# Patient Record
Sex: Female | Born: 1940 | Race: Black or African American | Hispanic: No | Marital: Single | Smoking: Former smoker
Health system: Southern US, Community
[De-identification: ages and names within clinical notes are randomized; demographics above are authoritative.]

## PROBLEM LIST (undated history)

## (undated) DIAGNOSIS — F0280 Dementia in other diseases classified elsewhere without behavioral disturbance: Secondary | ICD-10-CM

## (undated) DIAGNOSIS — G309 Alzheimer's disease, unspecified: Secondary | ICD-10-CM

## (undated) DIAGNOSIS — R079 Chest pain, unspecified: Secondary | ICD-10-CM

## (undated) DIAGNOSIS — F319 Bipolar disorder, unspecified: Secondary | ICD-10-CM

## (undated) DIAGNOSIS — R1310 Dysphagia, unspecified: Secondary | ICD-10-CM

## (undated) DIAGNOSIS — J392 Other diseases of pharynx: Secondary | ICD-10-CM

## (undated) DIAGNOSIS — M79671 Pain in right foot: Secondary | ICD-10-CM

## (undated) DIAGNOSIS — R51 Headache: Secondary | ICD-10-CM

## (undated) DIAGNOSIS — K648 Other hemorrhoids: Secondary | ICD-10-CM

## (undated) DIAGNOSIS — E039 Hypothyroidism, unspecified: Secondary | ICD-10-CM

## (undated) DIAGNOSIS — H8110 Benign paroxysmal vertigo, unspecified ear: Secondary | ICD-10-CM

## (undated) DIAGNOSIS — M7989 Other specified soft tissue disorders: Secondary | ICD-10-CM

## (undated) DIAGNOSIS — M654 Radial styloid tenosynovitis [de Quervain]: Secondary | ICD-10-CM

## (undated) DIAGNOSIS — K219 Gastro-esophageal reflux disease without esophagitis: Secondary | ICD-10-CM

## (undated) DIAGNOSIS — E785 Hyperlipidemia, unspecified: Secondary | ICD-10-CM

## (undated) DIAGNOSIS — D649 Anemia, unspecified: Secondary | ICD-10-CM

## (undated) DIAGNOSIS — M5430 Sciatica, unspecified side: Secondary | ICD-10-CM

## (undated) DIAGNOSIS — M7918 Myalgia, other site: Secondary | ICD-10-CM

## (undated) DIAGNOSIS — R209 Unspecified disturbances of skin sensation: Secondary | ICD-10-CM

## (undated) DIAGNOSIS — M436 Torticollis: Secondary | ICD-10-CM

## (undated) DIAGNOSIS — R300 Dysuria: Secondary | ICD-10-CM

## (undated) DIAGNOSIS — E876 Hypokalemia: Secondary | ICD-10-CM

## (undated) DIAGNOSIS — Z5189 Encounter for other specified aftercare: Secondary | ICD-10-CM

## (undated) DIAGNOSIS — R109 Unspecified abdominal pain: Secondary | ICD-10-CM

## (undated) DIAGNOSIS — T7840XA Allergy, unspecified, initial encounter: Secondary | ICD-10-CM

## (undated) DIAGNOSIS — I10 Essential (primary) hypertension: Secondary | ICD-10-CM

## (undated) DIAGNOSIS — D126 Benign neoplasm of colon, unspecified: Secondary | ICD-10-CM

## (undated) DIAGNOSIS — H269 Unspecified cataract: Secondary | ICD-10-CM

## (undated) DIAGNOSIS — D849 Immunodeficiency, unspecified: Secondary | ICD-10-CM

## (undated) DIAGNOSIS — R569 Unspecified convulsions: Secondary | ICD-10-CM

## (undated) HISTORY — DX: Dementia in other diseases classified elsewhere, unspecified severity, without behavioral disturbance, psychotic disturbance, mood disturbance, and anxiety: F02.80

## (undated) HISTORY — DX: Benign paroxysmal vertigo, unspecified ear: H81.10

## (undated) HISTORY — DX: Hypokalemia: E87.6

## (undated) HISTORY — DX: Benign neoplasm of colon, unspecified: D12.6

## (undated) HISTORY — DX: Alzheimer's disease, unspecified: G30.9

## (undated) HISTORY — DX: Sciatica, unspecified side: M54.30

## (undated) HISTORY — DX: Myalgia, other site: M79.18

## (undated) HISTORY — DX: Hyperlipidemia, unspecified: E78.5

## (undated) HISTORY — DX: Other specified soft tissue disorders: M79.89

## (undated) HISTORY — DX: Unspecified abdominal pain: R10.9

## (undated) HISTORY — DX: Radial styloid tenosynovitis (de quervain): M65.4

## (undated) HISTORY — DX: Anemia, unspecified: D64.9

## (undated) HISTORY — DX: Encounter for other specified aftercare: Z51.89

## (undated) HISTORY — DX: Unspecified disturbances of skin sensation: R20.9

## (undated) HISTORY — DX: Unspecified cataract: H26.9

## (undated) HISTORY — DX: Headache: R51

## (undated) HISTORY — DX: Allergy, unspecified, initial encounter: T78.40XA

## (undated) HISTORY — DX: Pain in right foot: M79.671

## (undated) HISTORY — DX: Torticollis: M43.6

## (undated) HISTORY — PX: ABDOMINAL HYSTERECTOMY: SHX81

## (undated) HISTORY — DX: Dysphagia, unspecified: R13.10

## (undated) HISTORY — DX: Dysuria: R30.0

## (undated) HISTORY — DX: Chest pain, unspecified: R07.9

## (undated) HISTORY — DX: Other diseases of pharynx: J39.2

## (undated) HISTORY — DX: Gastro-esophageal reflux disease without esophagitis: K21.9

## (undated) HISTORY — PX: TONSILECTOMY, ADENOIDECTOMY, BILATERAL MYRINGOTOMY AND TUBES: SHX2538

## (undated) HISTORY — DX: Other hemorrhoids: K64.8

---

## 1999-07-23 ENCOUNTER — Encounter: Admission: RE | Admit: 1999-07-23 | Discharge: 1999-07-23 | Payer: Self-pay | Admitting: Emergency Medicine

## 1999-07-23 ENCOUNTER — Encounter: Payer: Self-pay | Admitting: Emergency Medicine

## 1999-11-14 ENCOUNTER — Encounter: Admission: RE | Admit: 1999-11-14 | Discharge: 1999-11-14 | Payer: Self-pay | Admitting: Emergency Medicine

## 1999-11-14 ENCOUNTER — Encounter: Payer: Self-pay | Admitting: Emergency Medicine

## 2000-04-04 ENCOUNTER — Emergency Department (HOSPITAL_COMMUNITY): Admission: EM | Admit: 2000-04-04 | Discharge: 2000-04-04 | Payer: Self-pay | Admitting: Emergency Medicine

## 2000-04-05 ENCOUNTER — Emergency Department (HOSPITAL_COMMUNITY): Admission: EM | Admit: 2000-04-05 | Discharge: 2000-04-05 | Payer: Self-pay | Admitting: Emergency Medicine

## 2001-12-12 ENCOUNTER — Encounter: Payer: Self-pay | Admitting: Surgery

## 2001-12-12 ENCOUNTER — Encounter: Admission: RE | Admit: 2001-12-12 | Discharge: 2001-12-12 | Payer: Self-pay | Admitting: Surgery

## 2003-04-16 ENCOUNTER — Encounter: Payer: Self-pay | Admitting: Cardiology

## 2003-04-16 ENCOUNTER — Ambulatory Visit (HOSPITAL_COMMUNITY): Admission: RE | Admit: 2003-04-16 | Discharge: 2003-04-16 | Payer: Self-pay | Admitting: Cardiology

## 2003-06-05 ENCOUNTER — Encounter: Admission: RE | Admit: 2003-06-05 | Discharge: 2003-09-03 | Payer: Self-pay | Admitting: Specialist

## 2005-02-05 ENCOUNTER — Inpatient Hospital Stay (HOSPITAL_COMMUNITY): Admission: EM | Admit: 2005-02-05 | Discharge: 2005-02-10 | Payer: Self-pay | Admitting: Emergency Medicine

## 2005-02-05 ENCOUNTER — Ambulatory Visit: Payer: Self-pay | Admitting: Family Medicine

## 2005-02-09 ENCOUNTER — Encounter (INDEPENDENT_AMBULATORY_CARE_PROVIDER_SITE_OTHER): Payer: Self-pay | Admitting: Cardiology

## 2005-02-12 ENCOUNTER — Ambulatory Visit: Payer: Self-pay | Admitting: Family Medicine

## 2005-02-13 ENCOUNTER — Ambulatory Visit: Payer: Self-pay | Admitting: *Deleted

## 2005-02-19 ENCOUNTER — Encounter: Admission: RE | Admit: 2005-02-19 | Discharge: 2005-05-20 | Payer: Self-pay | Admitting: Family Medicine

## 2005-04-02 ENCOUNTER — Ambulatory Visit: Payer: Self-pay | Admitting: Family Medicine

## 2005-04-10 ENCOUNTER — Ambulatory Visit: Payer: Self-pay | Admitting: Family Medicine

## 2005-07-01 ENCOUNTER — Encounter: Admission: RE | Admit: 2005-07-01 | Discharge: 2005-07-01 | Payer: Self-pay | Admitting: Family Medicine

## 2005-07-15 ENCOUNTER — Ambulatory Visit: Payer: Self-pay | Admitting: Family Medicine

## 2005-07-30 ENCOUNTER — Ambulatory Visit: Payer: Self-pay | Admitting: Family Medicine

## 2005-08-03 ENCOUNTER — Encounter (INDEPENDENT_AMBULATORY_CARE_PROVIDER_SITE_OTHER): Payer: Self-pay | Admitting: *Deleted

## 2005-08-03 LAB — CONVERTED CEMR LAB

## 2005-08-04 ENCOUNTER — Ambulatory Visit: Payer: Self-pay | Admitting: Obstetrics & Gynecology

## 2005-08-06 ENCOUNTER — Ambulatory Visit (HOSPITAL_COMMUNITY): Admission: RE | Admit: 2005-08-06 | Discharge: 2005-08-06 | Payer: Self-pay | Admitting: Family Medicine

## 2005-08-11 ENCOUNTER — Ambulatory Visit: Payer: Self-pay | Admitting: Obstetrics & Gynecology

## 2005-08-12 ENCOUNTER — Ambulatory Visit: Admission: RE | Admit: 2005-08-12 | Discharge: 2005-08-12 | Payer: Self-pay | Admitting: Gynecologic Oncology

## 2005-08-27 ENCOUNTER — Ambulatory Visit (HOSPITAL_COMMUNITY): Admission: RE | Admit: 2005-08-27 | Discharge: 2005-08-27 | Payer: Self-pay | Admitting: Cardiology

## 2005-09-11 ENCOUNTER — Ambulatory Visit: Payer: Self-pay | Admitting: *Deleted

## 2005-09-11 ENCOUNTER — Inpatient Hospital Stay (HOSPITAL_COMMUNITY): Admission: AD | Admit: 2005-09-11 | Discharge: 2005-09-11 | Payer: Self-pay | Admitting: *Deleted

## 2005-10-20 ENCOUNTER — Ambulatory Visit: Admission: RE | Admit: 2005-10-20 | Discharge: 2005-10-20 | Payer: Self-pay | Admitting: Gynecologic Oncology

## 2005-10-26 ENCOUNTER — Ambulatory Visit (HOSPITAL_COMMUNITY): Admission: RE | Admit: 2005-10-26 | Discharge: 2005-10-26 | Payer: Self-pay | Admitting: Sports Medicine

## 2005-10-26 ENCOUNTER — Ambulatory Visit: Payer: Self-pay | Admitting: Family Medicine

## 2005-12-25 ENCOUNTER — Ambulatory Visit: Payer: Self-pay | Admitting: Family Medicine

## 2006-01-26 ENCOUNTER — Ambulatory Visit: Payer: Self-pay | Admitting: Sports Medicine

## 2006-03-30 ENCOUNTER — Ambulatory Visit: Payer: Self-pay | Admitting: Sports Medicine

## 2006-04-05 ENCOUNTER — Encounter: Admission: RE | Admit: 2006-04-05 | Discharge: 2006-06-02 | Payer: Self-pay | Admitting: Family Medicine

## 2006-05-26 ENCOUNTER — Ambulatory Visit: Payer: Self-pay | Admitting: Family Medicine

## 2006-06-25 ENCOUNTER — Encounter: Admission: RE | Admit: 2006-06-25 | Discharge: 2006-09-23 | Payer: Self-pay | Admitting: Family Medicine

## 2006-07-14 ENCOUNTER — Ambulatory Visit: Payer: Self-pay | Admitting: Family Medicine

## 2006-08-05 ENCOUNTER — Ambulatory Visit: Payer: Self-pay | Admitting: Sports Medicine

## 2006-08-26 ENCOUNTER — Ambulatory Visit (HOSPITAL_COMMUNITY): Admission: RE | Admit: 2006-08-26 | Discharge: 2006-08-26 | Payer: Self-pay | Admitting: Family Medicine

## 2006-11-09 ENCOUNTER — Ambulatory Visit: Payer: Self-pay | Admitting: Family Medicine

## 2006-11-25 DIAGNOSIS — I5022 Chronic systolic (congestive) heart failure: Secondary | ICD-10-CM

## 2006-11-25 DIAGNOSIS — K219 Gastro-esophageal reflux disease without esophagitis: Secondary | ICD-10-CM | POA: Insufficient documentation

## 2006-11-25 DIAGNOSIS — E785 Hyperlipidemia, unspecified: Secondary | ICD-10-CM

## 2006-11-25 DIAGNOSIS — M1990 Unspecified osteoarthritis, unspecified site: Secondary | ICD-10-CM

## 2006-11-25 DIAGNOSIS — E039 Hypothyroidism, unspecified: Secondary | ICD-10-CM

## 2006-11-25 DIAGNOSIS — E1165 Type 2 diabetes mellitus with hyperglycemia: Secondary | ICD-10-CM

## 2006-11-25 DIAGNOSIS — I10 Essential (primary) hypertension: Secondary | ICD-10-CM

## 2006-11-26 ENCOUNTER — Encounter (INDEPENDENT_AMBULATORY_CARE_PROVIDER_SITE_OTHER): Payer: Self-pay | Admitting: *Deleted

## 2006-12-24 ENCOUNTER — Telehealth (INDEPENDENT_AMBULATORY_CARE_PROVIDER_SITE_OTHER): Payer: Self-pay | Admitting: Family Medicine

## 2006-12-24 ENCOUNTER — Emergency Department (HOSPITAL_COMMUNITY): Admission: EM | Admit: 2006-12-24 | Discharge: 2006-12-24 | Payer: Self-pay | Admitting: Emergency Medicine

## 2007-02-15 ENCOUNTER — Telehealth (INDEPENDENT_AMBULATORY_CARE_PROVIDER_SITE_OTHER): Payer: Self-pay | Admitting: Family Medicine

## 2007-02-15 ENCOUNTER — Encounter: Payer: Self-pay | Admitting: *Deleted

## 2007-03-16 ENCOUNTER — Telehealth: Payer: Self-pay | Admitting: *Deleted

## 2007-03-17 ENCOUNTER — Ambulatory Visit: Payer: Self-pay | Admitting: Family Medicine

## 2007-03-17 LAB — CONVERTED CEMR LAB: Hgb A1c MFr Bld: 9 %

## 2007-04-15 ENCOUNTER — Ambulatory Visit: Payer: Self-pay | Admitting: Family Medicine

## 2007-04-15 ENCOUNTER — Encounter (INDEPENDENT_AMBULATORY_CARE_PROVIDER_SITE_OTHER): Payer: Self-pay | Admitting: Family Medicine

## 2007-04-15 LAB — CONVERTED CEMR LAB
BUN: 15 mg/dL (ref 6–23)
CO2: 29 meq/L (ref 19–32)
Calcium: 8.8 mg/dL (ref 8.4–10.5)
Chloride: 105 meq/L (ref 96–112)
Cholesterol: 187 mg/dL (ref 0–200)
Creatinine, Ser: 1.16 mg/dL (ref 0.40–1.20)
Free T4: 1.1 ng/dL (ref 0.89–1.80)
Glucose, Bld: 154 mg/dL — ABNORMAL HIGH (ref 70–99)
HDL: 51 mg/dL (ref >39)
LDL Cholesterol: 112 mg/dL — ABNORMAL HIGH (ref 0–99)
Potassium: 3.7 meq/L (ref 3.5–5.3)
Sodium: 144 meq/L (ref 135–145)
TSH: 6.41 microintl units/mL — ABNORMAL HIGH (ref 0.350–5.50)
Total CHOL/HDL Ratio: 3.7
Triglycerides: 121 mg/dL (ref <150)
VLDL: 24 mg/dL (ref 0–40)

## 2007-04-20 ENCOUNTER — Encounter (INDEPENDENT_AMBULATORY_CARE_PROVIDER_SITE_OTHER): Payer: Self-pay | Admitting: Family Medicine

## 2007-05-17 ENCOUNTER — Ambulatory Visit: Payer: Self-pay | Admitting: Family Medicine

## 2007-06-23 ENCOUNTER — Ambulatory Visit (HOSPITAL_COMMUNITY): Admission: RE | Admit: 2007-06-23 | Discharge: 2007-06-23 | Payer: Self-pay | Admitting: Family Medicine

## 2007-06-23 ENCOUNTER — Ambulatory Visit: Payer: Self-pay | Admitting: Family Medicine

## 2007-06-23 LAB — CONVERTED CEMR LAB: Hgb A1c MFr Bld: 8.4 %

## 2007-06-24 ENCOUNTER — Encounter (INDEPENDENT_AMBULATORY_CARE_PROVIDER_SITE_OTHER): Payer: Self-pay | Admitting: Family Medicine

## 2007-09-05 ENCOUNTER — Telehealth: Payer: Self-pay | Admitting: *Deleted

## 2007-11-30 ENCOUNTER — Ambulatory Visit: Payer: Self-pay | Admitting: Family Medicine

## 2007-12-21 ENCOUNTER — Ambulatory Visit: Payer: Self-pay | Admitting: Family Medicine

## 2007-12-28 ENCOUNTER — Ambulatory Visit: Payer: Self-pay | Admitting: Family Medicine

## 2007-12-29 ENCOUNTER — Telehealth: Payer: Self-pay | Admitting: *Deleted

## 2007-12-30 ENCOUNTER — Encounter: Admission: RE | Admit: 2007-12-30 | Discharge: 2007-12-30 | Payer: Self-pay | Admitting: Family Medicine

## 2007-12-30 ENCOUNTER — Encounter (INDEPENDENT_AMBULATORY_CARE_PROVIDER_SITE_OTHER): Payer: Self-pay | Admitting: Family Medicine

## 2008-01-03 ENCOUNTER — Telehealth: Payer: Self-pay | Admitting: *Deleted

## 2008-01-11 ENCOUNTER — Telehealth (INDEPENDENT_AMBULATORY_CARE_PROVIDER_SITE_OTHER): Payer: Self-pay | Admitting: *Deleted

## 2008-01-11 ENCOUNTER — Encounter: Payer: Self-pay | Admitting: *Deleted

## 2008-01-19 ENCOUNTER — Ambulatory Visit: Payer: Self-pay | Admitting: Family Medicine

## 2008-01-19 DIAGNOSIS — J309 Allergic rhinitis, unspecified: Secondary | ICD-10-CM

## 2008-01-23 ENCOUNTER — Encounter (INDEPENDENT_AMBULATORY_CARE_PROVIDER_SITE_OTHER): Payer: Self-pay | Admitting: Family Medicine

## 2008-01-26 ENCOUNTER — Ambulatory Visit: Payer: Self-pay | Admitting: Family Medicine

## 2008-01-31 ENCOUNTER — Ambulatory Visit: Payer: Self-pay | Admitting: Sports Medicine

## 2008-02-14 ENCOUNTER — Ambulatory Visit: Payer: Self-pay | Admitting: Sports Medicine

## 2008-03-16 ENCOUNTER — Ambulatory Visit: Payer: Self-pay | Admitting: Family Medicine

## 2008-03-16 LAB — CONVERTED CEMR LAB: Hgb A1c MFr Bld: 7.3 %

## 2008-03-19 ENCOUNTER — Telehealth (INDEPENDENT_AMBULATORY_CARE_PROVIDER_SITE_OTHER): Payer: Self-pay | Admitting: *Deleted

## 2008-04-02 ENCOUNTER — Encounter: Payer: Self-pay | Admitting: Family Medicine

## 2008-04-02 ENCOUNTER — Encounter: Admission: RE | Admit: 2008-04-02 | Discharge: 2008-04-19 | Payer: Self-pay | Admitting: Family Medicine

## 2008-04-05 ENCOUNTER — Encounter: Payer: Self-pay | Admitting: Family Medicine

## 2008-04-18 ENCOUNTER — Ambulatory Visit: Payer: Self-pay | Admitting: Sports Medicine

## 2008-04-19 ENCOUNTER — Encounter: Payer: Self-pay | Admitting: Family Medicine

## 2008-05-01 ENCOUNTER — Telehealth: Payer: Self-pay | Admitting: *Deleted

## 2008-07-02 ENCOUNTER — Ambulatory Visit: Payer: Self-pay | Admitting: Sports Medicine

## 2008-07-02 DIAGNOSIS — M533 Sacrococcygeal disorders, not elsewhere classified: Secondary | ICD-10-CM | POA: Insufficient documentation

## 2008-07-09 ENCOUNTER — Ambulatory Visit: Payer: Self-pay | Admitting: Family Medicine

## 2008-07-09 ENCOUNTER — Encounter: Payer: Self-pay | Admitting: Family Medicine

## 2008-07-09 LAB — CONVERTED CEMR LAB
BUN: 15 mg/dL (ref 6–23)
CO2: 26 meq/L (ref 19–32)
Calcium: 8.8 mg/dL (ref 8.4–10.5)
Chloride: 103 meq/L (ref 96–112)
Creatinine, Ser: 1.16 mg/dL (ref 0.40–1.20)
Glucose, Bld: 122 mg/dL — ABNORMAL HIGH (ref 70–99)
Hgb A1c MFr Bld: 7.9 %
Potassium: 3.8 meq/L (ref 3.5–5.3)
Sodium: 142 meq/L (ref 135–145)

## 2008-07-12 ENCOUNTER — Telehealth (INDEPENDENT_AMBULATORY_CARE_PROVIDER_SITE_OTHER): Payer: Self-pay | Admitting: *Deleted

## 2008-07-14 ENCOUNTER — Encounter: Payer: Self-pay | Admitting: Family Medicine

## 2008-07-26 ENCOUNTER — Telehealth: Payer: Self-pay | Admitting: Family Medicine

## 2008-09-10 ENCOUNTER — Encounter: Payer: Self-pay | Admitting: Family Medicine

## 2008-09-11 ENCOUNTER — Ambulatory Visit: Payer: Self-pay | Admitting: Family Medicine

## 2008-09-11 ENCOUNTER — Ambulatory Visit: Payer: Self-pay | Admitting: Sports Medicine

## 2008-09-11 ENCOUNTER — Encounter: Payer: Self-pay | Admitting: Family Medicine

## 2008-09-11 DIAGNOSIS — E669 Obesity, unspecified: Secondary | ICD-10-CM | POA: Insufficient documentation

## 2008-09-11 LAB — CONVERTED CEMR LAB: TSH: 7.952 microintl units/mL — ABNORMAL HIGH (ref 0.350–4.50)

## 2008-12-21 ENCOUNTER — Encounter (INDEPENDENT_AMBULATORY_CARE_PROVIDER_SITE_OTHER): Payer: Self-pay | Admitting: *Deleted

## 2009-01-10 ENCOUNTER — Encounter: Payer: Self-pay | Admitting: Family Medicine

## 2009-01-10 ENCOUNTER — Ambulatory Visit: Payer: Self-pay | Admitting: Family Medicine

## 2009-01-10 LAB — CONVERTED CEMR LAB: Hgb A1c MFr Bld: 9 %

## 2009-01-11 LAB — CONVERTED CEMR LAB
Cholesterol: 180 mg/dL (ref 0–200)
HDL: 47 mg/dL (ref >39)
LDL Cholesterol: 110 mg/dL — ABNORMAL HIGH (ref 0–99)
TSH: 3.552 microintl units/mL (ref 0.350–4.500)
Total CHOL/HDL Ratio: 3.8
Triglycerides: 116 mg/dL (ref <150)
VLDL: 23 mg/dL (ref 0–40)

## 2009-02-15 ENCOUNTER — Encounter: Payer: Self-pay | Admitting: Family Medicine

## 2009-03-21 ENCOUNTER — Encounter: Payer: Self-pay | Admitting: Family Medicine

## 2009-03-22 ENCOUNTER — Ambulatory Visit: Payer: Self-pay | Admitting: Family Medicine

## 2009-03-22 ENCOUNTER — Encounter: Payer: Self-pay | Admitting: Family Medicine

## 2009-03-22 LAB — CONVERTED CEMR LAB

## 2009-03-23 ENCOUNTER — Encounter: Payer: Self-pay | Admitting: Family Medicine

## 2009-04-09 ENCOUNTER — Telehealth: Payer: Self-pay | Admitting: Family Medicine

## 2009-04-15 ENCOUNTER — Telehealth: Payer: Self-pay | Admitting: Family Medicine

## 2009-07-03 ENCOUNTER — Ambulatory Visit: Payer: Self-pay | Admitting: Family Medicine

## 2009-07-03 LAB — CONVERTED CEMR LAB: Hgb A1c MFr Bld: 8.9 %

## 2009-07-12 ENCOUNTER — Encounter: Payer: Self-pay | Admitting: Family Medicine

## 2009-07-25 ENCOUNTER — Encounter: Payer: Self-pay | Admitting: Family Medicine

## 2009-11-12 ENCOUNTER — Encounter: Payer: Self-pay | Admitting: Family Medicine

## 2009-11-12 ENCOUNTER — Ambulatory Visit: Payer: Self-pay | Admitting: Family Medicine

## 2009-11-12 LAB — CONVERTED CEMR LAB
ALT: 16 units/L (ref 0–35)
AST: 21 units/L (ref 0–37)
Albumin: 4.2 g/dL (ref 3.5–5.2)
Alkaline Phosphatase: 122 units/L — ABNORMAL HIGH (ref 39–117)
BUN: 11 mg/dL (ref 6–23)
CO2: 28 meq/L (ref 19–32)
Calcium: 9.3 mg/dL (ref 8.4–10.5)
Chloride: 100 meq/L (ref 96–112)
Creatinine, Ser: 0.92 mg/dL (ref 0.40–1.20)
Direct LDL: 91 mg/dL
Glucose, Bld: 340 mg/dL — ABNORMAL HIGH (ref 70–99)
Hgb A1c MFr Bld: 13.4 %
Potassium: 3.6 meq/L (ref 3.5–5.3)
Sodium: 140 meq/L (ref 135–145)
Total Bilirubin: 0.5 mg/dL (ref 0.3–1.2)
Total Protein: 7.1 g/dL (ref 6.0–8.3)

## 2009-11-13 ENCOUNTER — Encounter: Payer: Self-pay | Admitting: Family Medicine

## 2009-11-28 ENCOUNTER — Ambulatory Visit: Payer: Self-pay | Admitting: Family Medicine

## 2009-11-28 DIAGNOSIS — M545 Low back pain, unspecified: Secondary | ICD-10-CM | POA: Insufficient documentation

## 2010-03-20 ENCOUNTER — Encounter: Payer: Self-pay | Admitting: Family Medicine

## 2010-03-24 ENCOUNTER — Encounter: Payer: Self-pay | Admitting: Family Medicine

## 2010-03-24 ENCOUNTER — Ambulatory Visit: Payer: Self-pay | Admitting: Family Medicine

## 2010-03-24 LAB — CONVERTED CEMR LAB: Hgb A1c MFr Bld: 8 %

## 2010-08-12 ENCOUNTER — Encounter: Payer: Self-pay | Admitting: Family Medicine

## 2010-08-12 ENCOUNTER — Ambulatory Visit: Payer: Self-pay | Admitting: Family Medicine

## 2010-08-12 DIAGNOSIS — IMO0002 Reserved for concepts with insufficient information to code with codable children: Secondary | ICD-10-CM | POA: Insufficient documentation

## 2010-08-12 LAB — CONVERTED CEMR LAB
Hgb A1c MFr Bld: 10.4 %
TSH: 1.662 microintl units/mL (ref 0.350–4.500)

## 2010-08-13 ENCOUNTER — Telehealth: Payer: Self-pay | Admitting: *Deleted

## 2010-08-14 ENCOUNTER — Encounter: Payer: Self-pay | Admitting: *Deleted

## 2010-08-18 ENCOUNTER — Telehealth: Payer: Self-pay | Admitting: Family Medicine

## 2010-08-18 ENCOUNTER — Encounter: Payer: Self-pay | Admitting: *Deleted

## 2010-08-25 ENCOUNTER — Encounter: Payer: Self-pay | Admitting: Family Medicine

## 2010-10-28 NOTE — Assessment & Plan Note (Signed)
Summary: f/u eo   Vital Signs:  Patient profile:   70 year old female Weight:      208.1 pounds Temp:     98 degrees F Pulse rate:   75 / minute BP sitting:   153 / 83  Serial Vital Signs/Assessments:  Time      Position  BP       Pulse  Resp  Temp     By                     152/90                         Asher Muir MD   CC:  hip pain, dm, and htn.  History of Present Illness: 1.  hip pain--left hip.  starts around si joint.  worse when she gets up.  radiates down front of leg.  sometimes past knee.  previously diagnosed with SI joint pain.  had injection in the past (another doector)  started up about 2 weeks ago.  excruciating pain.  used heat.  that helped considerably.  still having some pain, but much better now.  took some tylenol, but that did not help much.  took an old darvocet that she had, which helped.  no bowel or bladder dysfunction, leg weakness or numbness, no hx of cancer, no saddle anesthesia  2.  diabetes--increased lantus to 50 units last visit and stopped meal covg.  first few days after this change, her sugars were  in the 300s.  then down in 200s.  lowest 127.  was shaky one morning, but did not take her sugar.  she self-increased her lantus up to 50 units.    3.  hypertension--high today 153/83.  still high on manual repeat.  did not take bp meds this morning.  on diovan and toprol  Current Medications (verified): 1)  Bayer Aspirin 325 Mg Tabs (Aspirin) .... Take 1 Tablet By Mouth Once A Day 2)  Diovan Hct 160-25 Mg Tabs (Valsartan-Hydrochlorothiazide) .... Take 1 Tablet By Mouth Daily 3)  Lantus Solostar 100 Unit/ml Soln (Insulin Glargine) .... 45 Units Sub-Cutaneously Daily.  Dispense Quantity For 1 Month 4)  Levothroid 175 Mcg Tabs (Levothyroxine Sodium) .... Take 1 Tab By Mouth Daily 5)  Simvastatin 40 Mg Tabs (Simvastatin) .... Take 1 Tablet By Mouth Once A Day 6)  Toprol Xl 50 Mg Tb24 (Metoprolol Succinate) .... Take 1 Tablet By Mouth Once A Day 7)   Glipizide 10 Mg Xr24h-Tab (Glipizide) .[name].. 1 Tab By Mouth Daily 8)  Pantoprazole Sodium 40 Mg Tbec (Pantoprazole Sodium) .[name].. 1 Tab By Mouth Daily For Reflux; Has Failed Trial of Otc Prilosec For 3 Months and Prescription Omeprazole For 3 Months  Allergies: No Known Drug Allergies  Physical Exam  General:  Well-developed,overweight,in no acute distress; alert,appropriate and cooperative throughout examination Additional Exam:  vital signs reviewed    Detailed Back/Spine Exam  Gait:    Normal heel-toe gait pattern bilaterally.    Skin:    Intact with no erythema; no scarring.    Palpation:    ttp over entire left lower back  Lumbosacral Exam:  Inspection-deformity:    Normal Range of Motion:    Forward Flexion:   80 degrees    Hyperextension:   15 degrees    Right Lateral Bend:   30 degrees    Left Lateral Bend:   30 degrees Squatting:  normal Lying Straight Leg  Raise:    Right:  negative    Left:  positive at 70 degrees Sitting Straight Leg Raise:    Right:  negative    Left:  negative Sciatic Notch:    There is left sciatic notch tenderness.     strength 5/5 in major muscle groups of lower extremities   Impression & Recommendations:  Problem # 1:  BACK PAIN, LUMBAR (ICD-724.2) Assessment Deteriorated  SI joint vs radiculopathy.  it is improving.  no red flags.  treat conservatively with tramadol (cannot tolerate nsaids b/c of gerd).  does not seem to be a big component of spasm.  rtc when when she returns from [place] in June. Her updated medication list for this problem includes:    Bayer Aspirin 325 Mg Tabs (Aspirin) .[name]... Take 1 tablet by mouth once a day    Tramadol Hcl 50 Mg Tabs (Tramadol hcl) .[name]... 1 tab by mouth every 6 hours as needed back pain  Orders: [facility]- Est  Level 4 ([address])  Problem # 2:  DIABETES MELLITUS II, UNCOMPLICATED (ICD-250.00) Assessment: Improved  better sugars.  ?maybe she was low once when she felt shaky, but she did not  check her meds.  Increase lantus to 53.  she is to call me if she goes low (or remains high).  follow up in june when she returns from trip.   Her updated medication list for this problem includes:    Bayer Aspirin 325 Mg Tabs (Aspirin) .[name]... Take 1 tablet by mouth once a day    Diovan Hct 160-25 Mg Tabs (Valsartan-hydrochlorothiazide) .[name]... Take 1 tablet by mouth daily    Lantus Solostar 100 Unit/ml Soln (Insulin glargine) .[name]... 53 units sub-cutaneously daily.  dispense quantity for 1 month    Glipizide 10 Mg Xr24h-tab (Glipizide) .[name]... 1 tab by mouth daily  Orders: [facility]- Est  Level 4 ([address])  Problem # 3:  HYPERTENSION, BENIGN SYSTEMIC (ICD-401.1) Assessment: Unchanged  did not take her meds this morning.  needs to take her meds. Her updated medication list for this problem includes:    Diovan Hct 160-25 Mg Tabs (Valsartan-hydrochlorothiazide) .[name]... Take 1 tablet by mouth daily    Toprol Xl 50 Mg Tb24 (Metoprolol succinate) .[name]... Take 1 tablet by mouth once a day  Orders: [facility]- Est  Level 4 ([address]11)  Complete Medication List: 1)  Bayer Aspirin 325 Mg Tabs (Aspirin) .... Take 1 tablet by mouth once a day 2)  Diovan Hct 160-25 Mg Tabs (Valsartan-hydrochlorothiazide) .... Take 1 tablet by mouth daily 3)  Lantus Solostar 100 Unit/ml Soln (Insulin glargine) .... 53 units sub-cutaneously daily.  dispense quantity for 1 month 4)  Levothroid 175 Mcg Tabs (Levothyroxine sodium) .... Take 1 tab by mouth daily 5)  Simvastatin 40 Mg Tabs (Simvastatin) .... Take 1 tablet by mouth once a day 6)  Toprol Xl 50 Mg Tb24 (Metoprolol succinate) .... Take 1 tablet by mouth once a day 7)  Glipizide 10 Mg Xr24h-tab (Glipizide) .[name].. 1 tab by mouth daily 8)  Pantoprazole Sodium 40 Mg Tbec (Pantoprazole sodium) .[name].. 1 tab by mouth daily for reflux; has failed trial of otc prilosec for 3 months and prescription omeprazole for 3 months 9)  Tramadol Hcl 50 Mg Tabs (Tramadol hcl) .[name].. 1 tab by mouth every 6 hours as  needed back pain 10)  Fluconazole 150 Mg Tabs (Fluconazole) .[name].. 1 tab by mouth x 1 if needed for yeast infection  Patient Instructions: 1)  It was nice to see you  today. 2)  Increase your lantus to 53.  Call me if your sugars below 60. 3)  Take the tramadol for your pain. 4)  If you lose feeling in your groin, if your legs go numb or really weak, go to the doctor immediately.   5)  Please schedule a follow-up appointment when you get back from [place].   Prescriptions: FLUCONAZOLE 150 MG TABS (FLUCONAZOLE) 1 tab by mouth X 1 if needed for yeast infection  #1 x 0   Entered and Authorized by:   Asher Muir MD   Signed by:   Asher Muir MD on 11/28/2009   Method used:   Print then Give to Patient   RxID:   [id number] TRAMADOL HCL 50 MG TABS (TRAMADOL HCL) 1 tab by mouth every 6 hours as needed back pain  #90 x 2   Entered and Authorized by:   Asher Muir MD   Signed by:   Asher Muir MD on 11/28/2009   Method used:   Print then Give to Patient   RxID:   [id number] LANTUS SOLOSTAR 100 UNIT/ML SOLN (INSULIN GLARGINE) 53 units sub-cutaneously daily.  dispense quantity for 1 month  #1 x 3   Entered and Authorized by:   Asher Muir MD   Signed by:   Asher Muir MD on 11/28/2009   Method used:   Print then Give to Patient   RxID:   [id number] LANTUS SOLOSTAR 100 UNIT/ML SOLN (INSULIN GLARGINE) 45 units sub-cutaneously daily.  dispense quantity for 1 month  #1 x 3   Entered and Authorized by:   Asher Muir MD   Signed by:   Asher Muir MD on 11/28/2009   Method used:   Print then Give to Patient   RxID:   [id number]   Prevention & Chronic Care Immunizations   Influenza vaccine: Fluvax Non-MCR  (07/09/2008)   Influenza vaccine due: 07/09/2009    Tetanus booster: 09/11/2008: given   Tetanus booster due: 09/11/2018    Pneumococcal vaccine: Done.  (06/28/2005)   Pneumococcal vaccine due: None    H. zoster vaccine:  09/11/2008: refused  Colorectal Screening   Hemoccult: Done.  (04/02/2005)   Hemoccult due: Not Indicated    Colonoscopy: Done.  (07/03/2005)   Colonoscopy due: 07/04/2015  Other Screening   Pap smear: Done.  (08/03/2005)   Pap smear due: 08/03/2006    Mammogram: normal  (01/02/2008)   Mammogram due: 01/01/2009    DXA bone density scan: Not documented   Smoking status: quit > 6 months  (11/12/2009)  Diabetes Mellitus   HgbA1C: 13.4  (11/12/2009)   Hemoglobin A1C due: 09/22/2007    Eye exam: Not documented    Foot exam: yes  (03/22/2009)   High risk foot: Not documented   Foot care education: Not documented   Foot exam due: 05/16/2008    Urine microalbumin/creatinine ratio: Not documented    Diabetes flowsheet reviewed?: Yes   Progress toward A1C goal: Improved    Stage of readiness to change (diabetes management): Action   Diabetes comments: sugars improved since back on lantus  Lipids   Total Cholesterol: 180  (01/10/2009)   LDL: 110  (01/10/2009)   LDL Direct: 91  (11/12/2009)   HDL: 47  (01/10/2009)   Triglycerides: 116  (01/10/2009)    SGOT (AST): 21  (11/12/2009)   SGPT (ALT): 16  (11/12/2009)   Alkaline phosphatase: 122  (11/12/2009)   Total bilirubin: 0.5  (11/12/2009)    Lipid flowsheet reviewed?: Yes  Progress toward LDL goal: Unchanged    Stage of readiness to change (lipid management): Action  Hypertension   Last Blood Pressure: 153 / 83  (11/28/2009)   Serum creatinine: 0.92  (11/12/2009)   Serum potassium 3.6  (11/12/2009)    Hypertension flowsheet reviewed?: Yes   Progress toward BP goal: Unchanged    Stage of readiness to change (hypertension management): Action   Hypertension comments: did not take meds this morning  Self-Management Support :   Personal Goals (by the next clinic visit) :     Personal A1C goal: 8  (07/03/2009)     Personal blood pressure goal: 130/80  (07/03/2009)     Personal LDL goal: 100  (07/03/2009)     Diabetes self-management support: Copy of home glucose meter record, CBG self-monitoring log  (11/28/2009)    Hypertension self-management support: BP self-monitoring log, Written self-care plan  (11/28/2009)   Hypertension self-care plan printed.    Lipid self-management support: Lipid monitoring log, Written self-care plan  (11/28/2009)   Lipid self-care plan printed.

## 2010-10-28 NOTE — Assessment & Plan Note (Signed)
Summary: patient summary/dm, htn, hld   Vital Signs:  Patient profile:   70 year old female Height:      62 inches Weight:      215.2 pounds BMI:     39.50 Temp:     98.1 degrees F oral Pulse rate:   78 / minute BP sitting:   156 / 88  (left arm) Cuff size:   large  Vitals Entered By: Gladstone Pih (March 24, 2010 1:47 PM) CC: dm, htn, hld Is Patient Diabetic? Yes Did you bring your meter with you today? No Pain Assessment Patient in pain? no        Primary Care Provider:  Jamie Brookes MD  CC:  dm, htn, and hld.  History of Present Illness: 1.  diabetes--lantus 54 and glipizide.  glucometer broke.  had one sugar in the 50s since February.  estimates 3 episodes below 70 since Feb.    2.  hypertension--high today at 156/88.  took diovan today, but out of toprol.   3.  hyperlipidemia--ldl 91 on simva 40  other general info for pt summary:  very lovely lady.  will go through periods when she stops taking her medicines.  then willl restart again.  is the mother of celestine Ronne Binning and grandmother of rhonda crenshaw.    Habits & Providers  Alcohol-Tobacco-Diet     Tobacco Status: quit     Tobacco Counseling: to quit use of tobacco products     Year Quit: 1990  Current Medications (verified): 1)  Bayer Aspirin 325 Mg Tabs (Aspirin) .... Take 1 Tablet By Mouth Once A Day 2)  Diovan Hct 160-25 Mg Tabs (Valsartan-Hydrochlorothiazide) .... Take 1 Tablet By Mouth Daily 3)  Lantus Solostar 100 Unit/ml Soln (Insulin Glargine) .... 57 Units Sub-Cutaneously Daily.  Dispense Quantity For 1 Month 4)  Levothroid 175 Mcg Tabs (Levothyroxine Sodium) .... Take 1 Tab By Mouth Daily 5)  Simvastatin 40 Mg Tabs (Simvastatin) .... Take 1 Tablet By Mouth Once A Day 6)  Toprol Xl 50 Mg Tb24 (Metoprolol Succinate) .... Take 1 Tablet By Mouth Once A Day 7)  Glipizide 10 Mg Xr24h-Tab (Glipizide) .[name].. 1 Tab By Mouth Daily 8)  Pantoprazole Sodium 40 Mg Tbec (Pantoprazole Sodium) .[name].. 1 Tab By  Mouth Daily For Reflux; Has Failed Trial of Otc Prilosec For 3 Months and Prescription Omeprazole For 3 Months 9)  Tramadol Hcl 50 Mg Tabs (Tramadol Hcl) .[name].. 1 Tab By Mouth Every 6 Hours As Needed Back Pain  Allergies: No Known Drug Allergies  Social History: Smoking Status:  quit  Review of Systems  The patient denies weight loss and weight gain.   CV:  Denies chest pain or discomfort; one episode of sob after uri.  Physical Exam  General:  Well-developed,overweight,in no acute distress; alert,appropriate and cooperative throughout examination Lungs:  Normal respiratory effort, chest expands symmetrically. Lungs are clear to auscultation, no crackles or wheezes. Heart:  Normal rate and regular rhythm. S1 and S2 normal without gallop, murmur, click, rub or other extra sounds. Additional Exam:  vital signs reviewed    Impression & Recommendations:  Problem # 1:  DIABETES MELLITUS II, UNCOMPLICATED (ICD-250.00)  A1C above goal, but MUCH improved back on her insulin.  will increase lantus to 57 units daily.  wrote her a script for her glucometer and supplies.  f/u 3 months Her updated medication list for this problem includes:    Bayer Aspirin 325 Mg Tabs (Aspirin) .[name]... Take 1 tablet by mouth once  a day    Diovan Hct 160-25 Mg Tabs (Valsartan-hydrochlorothiazide) .[name]... Take 1 tablet by mouth daily    Lantus Solostar 100 Unit/ml Soln (Insulin glargine) .Marland KitchenMarland KitchenMarland KitchenMarland Kitchen 57 units sub-cutaneously daily.  dispense quantity for 1 month    Glipizide 10 Mg Xr24h-tab (Glipizide) .[name]... 1 tab by mouth daily  Orders: A1C-[facility] ([address]) [facility]- Est  Level 4 ([address])  Labs Reviewed: Creat: 0.92 (11/12/2009)    Reviewed HgBA1c results: 8.0 (03/24/2010)  13.4 (11/12/2009)  Problem # 2:  HYPERTENSION, BENIGN SYSTEMIC (ICD-401.1) Assessment: Unchanged  high, but out of toprol.  refilled toprol. Her updated medication list for this problem includes:    Diovan Hct 160-25 Mg Tabs  (Valsartan-hydrochlorothiazide) .[name]... Take 1 tablet by mouth daily    Toprol Xl 50 Mg Tb24 (Metoprolol succinate) .[name]... Take 1 tablet by mouth once a day  Orders: [facility]- Est  Level 4 ([address]11)  Problem # 3:  HYPERLIPIDEMIA (ICD-272.4) Assessment: Unchanged  at goal on simva ldl 91 2/11 Her updated medication list for this problem includes:    Simvastatin 40 Mg Tabs (Simvastatin) .[name]... Take 1 tablet by mouth once a day  Labs Reviewed: SGOT: 21 (11/12/2009)   SGPT: 16 (11/12/2009)   HDL:47 (01/10/2009), 51 (04/15/2007)  LDL:110 (01/10/2009), 112 (04/15/2007)  Chol:180 (01/10/2009), 187 (04/15/2007)  Trig:116 (01/10/2009), 121 (04/15/2007)  Orders: [facility]- Est  Level 4 ([address]214)  Problem # 4:  GASTROESOPHAGEAL REFLUX, NO ESOPHAGITIS (ICD-530.81) Assessment: Comment Only has tried multiple other ppis.  only protonix controls her symptoms Her updated medication list for this problem includes:    Pantoprazole Sodium 40 Mg Tbec (Pantoprazole sodium) .[name]... 1 tab by mouth daily for reflux; has failed trial of otc prilosec for 3 months and prescription omeprazole for 3 months  Problem # 5:  BACK PAIN, LUMBAR (ICD-724.2) Assessment: Comment Only does well with tramadol Her updated medication list for this problem includes:    Bayer Aspirin 325 Mg Tabs (Aspirin) .[name]... Take 1 tablet by mouth once a day    Tramadol Hcl 50 Mg Tabs (Tramadol hcl) .[name]... 1 tab by mouth every 6 hours as needed back pain  Problem # 6:  SACROILIAC JOINT DYSFUNCTION (ICD-724.6) Assessment: Comment Only tramadol works well  Problem # 7:  HYPOTHYROIDISM, UNSPECIFIED (ICD-244.9) just realized that she is past due for tsh.  will call pt and ask her to come in for labs at her convenience Her updated medication list for this problem includes:    Levothroid 175 Mcg Tabs (Levothyroxine sodium) .[name]... Take 1 tab by mouth daily  Labs Reviewed: TSH: 3.552 (01/10/2009)    HgBA1c: 8.0 (03/24/2010) Chol: 180 (01/10/2009)   HDL: 47  (01/10/2009)   LDL: 110 (01/10/2009)   TG: 116 (01/10/2009)  Problem # 8:  OSTEOARTHRITIS, MULTI SITES (ICD-715.98) Assessment: Comment Only controlled with tramadol Her updated medication list for this problem includes:    Bayer Aspirin 325 Mg Tabs (Aspirin) .[name]... Take 1 tablet by mouth once a day    Tramadol Hcl 50 Mg Tabs (Tramadol hcl) .[name]... 1 tab by mouth every 6 hours as needed back pain  Problem # 9:  CHF - EJECTION FRACTION < 50% (ICD-428.22) Assessment: Comment Only this was on her problem list when I inheritied her.  we have never discussed it, and I cannot find any documentation to support this in centricity.  I did not want to remove it from the problem list just yet.   Her updated medication list for this problem includes:    Bayer Aspirin 325  Mg Tabs (Aspirin) .[name]... Take 1 tablet by mouth once a day    Diovan Hct 160-25 Mg Tabs (Valsartan-hydrochlorothiazide) .[name]... Take 1 tablet by mouth daily    Toprol Xl 50 Mg Tb24 (Metoprolol succinate) .[name]... Take 1 tablet by mouth once a day  Complete Medication List: 1)  Bayer Aspirin 325 Mg Tabs (Aspirin) .... Take 1 tablet by mouth once a day 2)  Diovan Hct 160-25 Mg Tabs (Valsartan-hydrochlorothiazide) .... Take 1 tablet by mouth daily 3)  Lantus Solostar 100 Unit/ml Soln (Insulin glargine) .... 57 units sub-cutaneously daily.  dispense quantity for 1 month 4)  Levothroid 175 Mcg Tabs (Levothyroxine sodium) .... Take 1 tab by mouth daily 5)  Simvastatin 40 Mg Tabs (Simvastatin) .... Take 1 tablet by mouth once a day 6)  Toprol Xl 50 Mg Tb24 (Metoprolol succinate) .... Take 1 tablet by mouth once a day 7)  Glipizide 10 Mg Xr24h-tab (Glipizide) .[name].. 1 tab by mouth daily 8)  Pantoprazole Sodium 40 Mg Tbec (Pantoprazole sodium) .[name].. 1 tab by mouth daily for reflux; has failed trial of otc prilosec for 3 months and prescription omeprazole for 3 months 9)  Tramadol Hcl 50 Mg Tabs (Tramadol hcl) .[name].. 1 tab by mouth every 6 hours as needed  back pain  Other Orders: Mammogram (Screening) (Mammo)  Patient Instructions: 1)  It was nice to see you today. 2)  Be sure to make appointments for your mammogram (try [facility]) and your eye doctor.   3)  For diabetes, your A1C is MUCH better! 4)  Increase your lantus to 57 units.  If your morning sugar is <70, decrease your dose by 10 units that day. 5)  Fill your toprol!! 6)  Please schedule a follow-up appointment in 3 months with your new doctor (Dr. Clotilde Dieter).  Prescriptions: TOPROL XL 50 MG TB24 (METOPROLOL SUCCINATE) Take 1 tablet by mouth once a day  #30 x 6   Entered and Authorized by:   Asher Muir MD   Signed by:   Asher Muir MD on 03/24/2010   Method used:   Electronically to        [facility] (retail)       [address]       [place], [place]  [id number]       Ph: [phone]       Fax: [phone]   RxID:   [id number]   Laboratory Results   Blood Tests   Date/Time Received: March 24, 2010 1:52 PM  Date/Time Reported: March 24, 2010 2:09 PM   HGBA1C: 8.0%   (Normal Range: Non-Diabetic - 3-6%   Control Diabetic - 6-8%)  Comments: ...............test performed by......Marland KitchenBonnie A. [place], MLS (ASCP)cm      Prevention & Chronic Care Immunizations   Influenza vaccine: Fluvax Non-MCR  (07/09/2008)   Influenza vaccine due: 07/09/2009    Tetanus booster: 09/11/2008: given   Tetanus booster due: 09/11/2018    Pneumococcal vaccine: Done.  (06/28/2005)   Pneumococcal vaccine due: None    H. zoster vaccine: 09/11/2008: refused  Colorectal Screening   Hemoccult: Done.  (04/02/2005)   Hemoccult due: Not Indicated    Colonoscopy: Done.  (07/03/2005)   Colonoscopy due: 07/04/2015  Other Screening   Pap smear: Done.  (08/03/2005)   Pap smear action/deferral: Not indicated-other  (03/24/2010)   Pap smear due: 08/03/2006    Mammogram: normal  (01/02/2008)   Mammogram action/deferral: Refused  (03/24/2010)    Mammogram due: 01/01/2009    DXA  bone density scan: Not documented   Smoking status: quit  (03/24/2010)  Diabetes Mellitus   HgbA1C: 8.0  (03/24/2010)   Hemoglobin A1C due: 09/22/2007    Eye exam: Not documented    Foot exam: yes  (03/22/2009)   High risk foot: Not documented   Foot care education: Not documented   Foot exam due: 05/16/2008    Urine microalbumin/creatinine ratio: Not documented    Diabetes flowsheet reviewed?: Yes   Progress toward A1C goal: Improved  Lipids   Total Cholesterol: 180  (01/10/2009)   LDL: 110  (01/10/2009)   LDL Direct: 91  (11/12/2009)   HDL: 47  (01/10/2009)   Triglycerides: 116  (01/10/2009)    SGOT (AST): 21  (11/12/2009)   SGPT (ALT): 16  (11/12/2009)   Alkaline phosphatase: 122  (11/12/2009)   Total bilirubin: 0.5  (11/12/2009)    Lipid flowsheet reviewed?: Yes   Progress toward LDL goal: At goal  Hypertension   Last Blood Pressure: 156 / 88  (03/24/2010)   Serum creatinine: 0.92  (11/12/2009)   Serum potassium 3.6  (11/12/2009)    Hypertension flowsheet reviewed?: Yes   Progress toward BP goal: Unchanged   Hypertension comments: out of toprol  Self-Management Support :   Personal Goals (by the next clinic visit) :     Personal A1C goal: 8  (07/03/2009)     Personal blood pressure goal: 130/80  (07/03/2009)     Personal LDL goal: 100  (07/03/2009)    Diabetes self-management support: Copy of home glucose meter record, CBG self-monitoring log, Written self-care plan  (03/24/2010)   Diabetes care plan printed    Hypertension self-management support: BP self-monitoring log, Written self-care plan, Education handout  (03/24/2010)   Hypertension self-care plan printed.   Hypertension education handout printed    Lipid self-management support: Lipid monitoring log, Written self-care plan  (11/28/2009)    Nursing Instructions: Schedule screening mammogram (see order)   Appended Document: Orders Update    Clinical  Lists Changes  Orders: Added new Test order of TSH-[facility] [id number]) - Signed

## 2010-10-28 NOTE — Letter (Signed)
Summary: Generic Letter  [facility]  [address]   [place], [place] [address]   Phone: [phone]  Fax: [phone]    11/13/2009  [name] [address] [place], [place]  [address]  Dear [name],    I just wanted to let you know that your lab results were mostly normal.  Your sugar was very high.  I still think you should go back on most of your medicines as we discussed earlier this week.  I look forward to talking with you at your next appointment before you leave for [place].  Please call me if you have any questions or concerns.          Sincerely,   Asher Muir MD  Appended Document: Generic Letter mailed.

## 2010-10-28 NOTE — Miscellaneous (Signed)
Summary: patient summary  Clinical Lists Changes  Very nice pt, but inconsistent with taking her medicines.  Her daughter is Insurance risk surveyor and granddaughter is Samara Snide.   Problems: Removed problem of Question of  DEPRESSION/ANXIETY (ICD-300.4) Removed problem of History of  HEMORRHOIDS (ICD-455.6) Assessed DIABETES MELLITUS II, UNCOMPLICATED as comment only - very poor control.  unfortunately Ms Mathison takes her medicines inconsistently.  at times, she has stopped them altogether Her updated medication list for this problem includes:    Bayer Aspirin 325 Mg Tabs (Aspirin) .[name]... Take 1 tablet by mouth once a day    Diovan Hct 160-25 Mg Tabs (Valsartan-hydrochlorothiazide) .[name]... Take 1 tablet by mouth daily    Lantus Solostar 100 Unit/ml Soln (Insulin glargine) .[name]... 53 units sub-cutaneously daily.  dispense quantity for 1 month    Glipizide 10 Mg Xr24h-tab (Glipizide) .[name]... 1 tab by mouth daily  Labs Reviewed: Creat: 0.92 (11/12/2009)    Reviewed HgBA1c results: 13.4 (11/12/2009)  8.9 (07/03/2009)  Assessed BACK PAIN, LUMBAR as comment only -  Her updated medication list for this problem includes:    Bayer Aspirin 325 Mg Tabs (Aspirin) .[name]... Take 1 tablet by mouth once a day    Tramadol Hcl 50 Mg Tabs (Tramadol hcl) .[name]... 1 tab by mouth every 6 hours as needed back pain  Assessed CHF - EJECTION FRACTION < 50% as comment only -  Her updated medication list for this problem includes:    Bayer Aspirin 325 Mg Tabs (Aspirin) .[name]... Take 1 tablet by mouth once a day    Diovan Hct 160-25 Mg Tabs (Valsartan-hydrochlorothiazide) .[name]... Take 1 tablet by mouth daily    Toprol Xl 50 Mg Tb24 (Metoprolol succinate) .[name]... Take 1 tablet by mouth once a day  Assessed HYPERTENSION, BENIGN SYSTEMIC as comment only - unfortunately, has been inconsistent about taking these meds as well Her updated medication list for this problem includes:    Diovan Hct 160-25 Mg Tabs  (Valsartan-hydrochlorothiazide) .[name]... Take 1 tablet by mouth daily    Toprol Xl 50 Mg Tb24 (Metoprolol succinate) .[name]... Take 1 tablet by mouth once a day  Prior BP: 153/83 (11/28/2009)  Labs Reviewed: K+: 3.6 (11/12/2009) Creat: : 0.92 (11/12/2009)   Chol: 180 (01/10/2009)   HDL: 47 (01/10/2009)   LDL: 110 (01/10/2009)   TG: 116 (01/10/2009)  Assessed GASTROESOPHAGEAL REFLUX, NO ESOPHAGITIS as comment only - has failed multiple ppi's.  protonix is that only ppi that has been effective for her, but challenging to get insurance to pay Her updated medication list for this problem includes:    Pantoprazole Sodium 40 Mg Tbec (Pantoprazole sodium) .[name]... 1 tab by mouth daily for reflux; has failed trial of otc prilosec for 3 months and prescription omeprazole for 3 months  Assessed HYPERLIPIDEMIA as comment only -  Her updated medication list for this problem includes:    Simvastatin 40 Mg Tabs (Simvastatin) .[name]... Take 1 tablet by mouth once a day  Labs Reviewed: SGOT: 21 (11/12/2009)   SGPT: 16 (11/12/2009)   HDL:47 (01/10/2009), 51 (04/15/2007)  LDL:110 (01/10/2009), 112 (04/15/2007)  Chol:180 (01/10/2009), 187 (04/15/2007)  Trig:116 (01/10/2009), 121 (04/15/2007)  Assessed HYPOTHYROIDISM, UNSPECIFIED as comment only - past due for tsh.  will check at her appt next week Her updated medication list for this problem includes:    Levothroid 175 Mcg Tabs (Levothyroxine sodium) .[name]... Take 1 tab by mouth daily  Labs Reviewed: TSH: 3.552 (01/10/2009)    HgBA1c: 13.4 (11/12/2009) Chol: 180 (01/10/2009)   HDL:  47 (01/10/2009)   LDL: 110 (01/10/2009)   TG: 116 (01/10/2009)  Assessed ALLERGIC RHINITIS as comment only -  not currently on any meds Assessed FIBROMYALGIA as comment only -  Her updated medication list for this problem includes:    Bayer Aspirin 325 Mg Tabs (Aspirin) .[name]... Take 1 tablet by mouth once a day    Tramadol Hcl 50 Mg Tabs (Tramadol hcl) .[name]... 1 tab by mouth every 6 hours as  needed back pain  Assessed NEUROPATHY, DIABETIC as comment only -  Her updated medication list for this problem includes:    Bayer Aspirin 325 Mg Tabs (Aspirin) .[name]... Take 1 tablet by mouth once a day    Diovan Hct 160-25 Mg Tabs (Valsartan-hydrochlorothiazide) .[name]... Take 1 tablet by mouth daily    Lantus Solostar 100 Unit/ml Soln (Insulin glargine) .[name]... 53 units sub-cutaneously daily.  dispense quantity for 1 month    Glipizide 10 Mg Xr24h-tab (Glipizide) .[name]... 1 tab by mouth daily  Assessed MIGRAINE, UNSPEC., W/O INTRACTABLE MIGRAINE as comment only -  Her updated medication list for this problem includes:    Bayer Aspirin 325 Mg Tabs (Aspirin) .[name]... Take 1 tablet by mouth once a day    Toprol Xl 50 Mg Tb24 (Metoprolol succinate) .[name]... Take 1 tablet by mouth once a day    Tramadol Hcl 50 Mg Tabs (Tramadol hcl) .[name]... 1 tab by mouth every 6 hours as needed back pain  Medications: Removed medication of FLUCONAZOLE 150 MG TABS (FLUCONAZOLE) 1 tab by mouth X 1 if needed for yeast infection Observations: Added new observation of MEDRECON: current updated (03/20/2010 13:01) Added new observation of SOCIAL HX: Lives with granddaughter; No longer a smoker but smoked 1ppd for  59yrs; NO etoh, no drugs, Daughter Henrine Screws, Granddaughter [place], both patients here.     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  unfortunately Ms Dellis takes her medicines inconsistently.  at times, she has stopped them altogether Her updated medication list for this problem includes:    Bayer Aspirin 325 Mg Tabs (Aspirin) .[name]... Take 1 tablet by mouth once a day    Diovan Hct 160-25 Mg Tabs (Valsartan-hydrochlorothiazide) .[name]... Take 1 tablet by mouth daily    Lantus Solostar 100 Unit/ml Soln (Insulin glargine) .[name]... 53 units sub-cutaneously daily.  dispense quantity for 1 month    Glipizide 10 Mg Xr24h-tab (Glipizide) .[name]... 1 tab by mouth daily  Labs Reviewed: Creat: 0.92 (11/12/2009)    Reviewed HgBA1c results: 13.4 (11/12/2009)  8.9 (07/03/2009)  Problem # 2:  BACK PAIN, LUMBAR (ICD-724.2) Assessment: Comment Only  Her updated medication list for  this problem includes:    Bayer Aspirin 325 Mg Tabs (Aspirin) .[name]... Take 1 tablet by mouth once a day    Tramadol Hcl 50 Mg Tabs (Tramadol hcl) .[name]... 1 tab by mouth every 6 hours as needed back pain  Problem # 3:  HYPERTENSION, BENIGN SYSTEMIC (ICD-401.1) unfortunately, has been inconsistent about taking these meds as well Her updated medication list for this problem includes:    Diovan Hct 160-25 Mg Tabs (Valsartan-hydrochlorothiazide) .[name]... Take 1 tablet by mouth daily    Toprol Xl 50 Mg Tb24 (Metoprolol succinate) .[name]... Take 1 tablet by mouth once a day  Prior BP: 153/83 (11/28/2009)  Labs Reviewed: K+: 3.6 (11/12/2009) Creat: : 0.92 (11/12/2009)   Chol: 180 (01/10/2009)   HDL: 47 (01/10/2009)   LDL: 110 (01/10/2009)   TG: 116 (01/10/2009)  Problem # 4:  CHF - EJECTION FRACTION < 50% (ICD-428.22) Assessment: Comment Only  Her updated medication list for this problem includes:    Bayer Aspirin 325 Mg Tabs  (Aspirin) .[name]... Take 1 tablet by mouth once a day    Diovan Hct 160-25 Mg Tabs (Valsartan-hydrochlorothiazide) .[name]... Take 1 tablet by mouth daily    Toprol Xl 50 Mg Tb24 (Metoprolol succinate) .[name]... Take 1 tablet by mouth once a day  Problem # 5:  HYPERLIPIDEMIA (ICD-272.4)  Her updated medication list for this problem includes:    Simvastatin 40 Mg Tabs (Simvastatin) .[name]... Take 1 tablet by mouth once a day  Labs Reviewed: SGOT: 21 (11/12/2009)   SGPT: 16 (11/12/2009)   HDL:47 (01/10/2009), 51 (04/15/2007)  LDL:110 (01/10/2009), 112 (04/15/2007)  Chol:180 (01/10/2009), 187 (04/15/2007)  Trig:116 (01/10/2009), 121 (04/15/2007)  Problem # 6:  GASTROESOPHAGEAL REFLUX, NO ESOPHAGITIS (ICD-530.81) Assessment: Comment Only has failed multiple ppi's.  protonix is that only ppi that has been effective for her, but challenging to get insurance to pay Her updated medication list for this problem includes:    Pantoprazole Sodium 40 Mg Tbec (Pantoprazole sodium) .[name]... 1 tab by mouth daily for reflux; has failed trial of otc prilosec for 3 months and prescription omeprazole for 3 months  Problem # 7:  HYPOTHYROIDISM, UNSPECIFIED (ICD-244.9) past due for tsh.  will check at her appt next week Her updated medication list for this problem includes:    Levothroid 175 Mcg Tabs (Levothyroxine sodium) .[name]... Take 1 tab by mouth daily  Labs Reviewed: TSH: 3.552 (01/10/2009)    HgBA1c: 13.4 (11/12/2009) Chol: 180 (01/10/2009)   HDL: 47 (01/10/2009)   LDL: 110 (01/10/2009)   TG: 116 (01/10/2009)  Problem # 8:  ALLERGIC RHINITIS (ICD-477.9)  not currently on any meds  Problem # 9:  FIBROMYALGIA (ICD-729.1) Assessment: Comment Only I am not 100%certain of this diagnosis. Her updated medication list for this problem includes:    Bayer Aspirin 325 Mg Tabs (Aspirin) .[name]... Take 1 tablet by mouth once a day    Tramadol Hcl 50 Mg Tabs (Tramadol hcl) .[name]... 1 tab by mouth every 6 hours as needed back  pain  Problem # 10:  NEUROPATHY, DIABETIC (ICD-250.60) Assessment: Comment Only  Her updated medication list for this problem includes:    Bayer Aspirin 325 Mg Tabs (Aspirin) .[name]... Take 1 tablet by mouth once a day    Diovan Hct 160-25 Mg Tabs (Valsartan-hydrochlorothiazide) .[name]... Take 1 tablet by mouth daily    Lantus Solostar 100 Unit/ml Soln (Insulin glargine) .[name]... 53 units sub-cutaneously daily.  dispense quantity for 1 month    Glipizide 10 Mg Xr24h-tab (Glipizide) .[name]... 1 tab  by mouth daily  Problem # 11:  MIGRAINE, UNSPEC., W/O INTRACTABLE MIGRAINE (ICD-346.90) Assessment: Comment Only  Her updated medication list for this problem includes:    Bayer Aspirin 325 Mg Tabs (Aspirin) .[name]... Take 1 tablet by mouth once a day    Toprol Xl 50 Mg Tb24 (Metoprolol succinate) .[name]... Take 1 tablet by mouth once a day    Tramadol Hcl 50 Mg Tabs (Tramadol hcl) .[name]... 1 tab by mouth every 6 hours as needed back pain  Complete Medication List: 1)  Bayer Aspirin 325 Mg Tabs (Aspirin) .... Take 1 tablet by mouth once a day 2)  Diovan Hct 160-25 Mg Tabs (Valsartan-hydrochlorothiazide) .... Take 1 tablet by mouth daily 3)  Lantus Solostar 100 Unit/ml Soln (Insulin glargine) .... 53 units sub-cutaneously daily.  dispense quantity for 1 month 4)  Levothroid 175 Mcg Tabs (Levothyroxine sodium) .... Take 1 tab by mouth daily 5)  Simvastatin 40 Mg Tabs (Simvastatin) .... Take 1 tablet by mouth once a day 6)  Toprol Xl 50 Mg Tb24 (Metoprolol succinate) .... Take 1 tablet by mouth once a day 7)  Glipizide 10 Mg Xr24h-tab (Glipizide) .[name].. 1 tab by mouth daily 8)  Pantoprazole Sodium 40 Mg Tbec (Pantoprazole sodium) .[name].. 1 tab by mouth daily for reflux; has failed trial of otc prilosec for 3 months and prescription omeprazole for 3 months 9)  Tramadol Hcl 50 Mg Tabs (Tramadol hcl) .[name].. 1 tab by mouth every 6 hours as needed back pain   Current Medications (verified): 1)  Bayer Aspirin 325 Mg Tabs (Aspirin)  .... Take 1 Tablet By Mouth Once A Day 2)  Diovan Hct 160-25 Mg Tabs (Valsartan-Hydrochlorothiazide) .... Take 1 Tablet By Mouth Daily 3)  Lantus Solostar 100 Unit/ml Soln (Insulin Glargine) .... 53 Units Sub-Cutaneously Daily.  Dispense Quantity For 1 Month 4)  Levothroid 175 Mcg Tabs (Levothyroxine Sodium) .... Take 1 Tab By Mouth Daily 5)  Simvastatin 40 Mg Tabs (Simvastatin) .... Take 1 Tablet By Mouth Once A Day 6)  Toprol Xl 50 Mg Tb24 (Metoprolol Succinate) .... Take 1 Tablet By Mouth Once A Day 7)  Glipizide 10 Mg Xr24h-Tab (Glipizide) .[name].. 1 Tab By Mouth Daily 8)  Pantoprazole Sodium 40 Mg Tbec (Pantoprazole Sodium) .[name].. 1 Tab By Mouth Daily For Reflux; Has Failed Trial of Otc Prilosec For 3 Months and Prescription Omeprazole For 3 Months 9)  Tramadol Hcl 50 Mg Tabs (Tramadol Hcl) .[name].. 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  compression of cord and spondylosis - 04/02/2005,  mod stenosis of R ICA, no aneurysm - 04/02/2005,  MRI / MRA - ACA & MCA Stenosis - 04/02/2005,  MRI neck: c3-c4 disk protrusion c - 04/02/2005           Family History: Dad-Stroke,  mom - chf, dm, colon CA, kidney  CA,  Sister-Peritoneal CA       daughter:  bipolar and diabetes, htn  Social History: Reviewed history from 01/31/2008 and no changes required. Lives with granddaughter; No longer a smoker but smoked 1ppd for  56yrs; NO etoh, no drugs, Daughter Henrine Screws, Granddaughter [place], both patients here.     Frequently visits son in [place]

## 2010-10-28 NOTE — Assessment & Plan Note (Signed)
Summary: KH   Vital Signs:  Patient profile:   70 year old female Height:      62 inches Weight:      203.5 pounds BMI:     37.36 Pulse rate:   73 / minute BP sitting:   153 / 89  (left arm) Cuff size:   large  Vitals Entered By: Arlyss Repress CMA, (November 12, 2009 10:17 AM) CC: dm, htn, hld Is Patient Diabetic? Yes Pain Assessment Patient in pain? yes     Location: left arm Intensity: 3 Onset of pain  Chronic   CC:  dm, htn, and hld.  History of Present Illness: Here for f/u visit for:  1.  diabetes--hgb A1C up considerably today at 13.4 (from 8.9 previously).  upon questioning, tells me that she has stopped taking all of her insulin and most of her medicines.  she states that she is tired of giving herself 4 shots a day.  she also says that she is not interested in living a long time.  she wants to have a good quality of life, not quantity.  she is really stressed right now by her family situation and that has contributed to her not taking her meds.  supposed to be on lantus 40 and novolog meal covg  2.  hypertension--high today at 153/89.  ran out of bp meds and did not refill them.  partly for the reasons above.  also, she owes her pharmacy some money.  supposed to be on diovan and toprol  3.  hyperlipidemia--not taking statin.  due for chol check.  last ldl 110  4.  hypothyroidism--still taking her synthroid.  due for tsh  Habits & Providers  Alcohol-Tobacco-Diet     Tobacco Status: quit > 6 months  Current Medications (verified): 1)  Bayer Aspirin 325 Mg Tabs (Aspirin) .... Take 1 Tablet By Mouth Once A Day 2)  Darvocet A500 100-500 Mg Tabs (Propoxyphene N-Apap) .... Take 1 Tablet By Mouth Every Four Hours 3)  Diovan Hct 160-25 Mg Tabs (Valsartan-Hydrochlorothiazide) .... Take 1 Tablet By Mouth Daily 4)  Lantus 100 Unit/ml Soln (Insulin Glargine) .... Inject 40 Unit Subcutaneously Every Morning 5)  Levothroid 175 Mcg Tabs (Levothyroxine Sodium) .... Take 1 Tab  By Mouth Daily 6)  Simvastatin 40 Mg Tabs (Simvastatin) .... Take 1 Tablet By Mouth Once A Day 7)  Toprol Xl 50 Mg Tb24 (Metoprolol Succinate) .... Take 1 Tablet By Mouth Once A Day 8)  Novolog 100 Unit/ml Soln (Insulin Aspart) .... Inject 7 Units Subcutaneously Three Times A Day Before Meals 9)  Glipizide 10 Mg Xr24h-Tab (Glipizide) .[name].. 1 Tab By Mouth Daily 10)  Pantoprazole Sodium 40 Mg Tbec (Pantoprazole Sodium) .[name].. 1 Tab By Mouth Daily For Reflux; Has Failed Trial of Otc Prilosec For 3 Months and Prescription Omeprazole For 3 Months  Allergies: No Known Drug Allergies  Social History: Smoking Status:  quit > 6 months  Review of Systems General:  Denies loss of appetite. CV:  Denies chest pain or discomfort. Psych:  Complains of anxiety, depression, and easily tearful; denies suicidal thoughts/plans and thoughts /plans of harming others.  Physical Exam  General:  Well-developed,overweight,in no acute distress; alert,appropriate and cooperative throughout examination Lungs:  Normal respiratory effort, chest expands symmetrically. Lungs are clear to auscultation, no crackles or wheezes. Heart:  Normal rate and regular rhythm. S1 and S2 normal without gallop, murmur, click, rub or other extra sounds. Psych:  tearful when talking about her family situation.  denies  suicidal thoughts.  otherwise, normally interactive.   Additional Exam:  vital signs reviewed    Impression & Recommendations:  Problem # 1:  DIABETES MELLITUS II, UNCOMPLICATED (ICD-250.00) Assessment Deteriorated reminded her that the complications from diabetes can include renal failure and other complications that may reduce the quality of her life.  agreed that making her A1C goal higher is appropriate.  We agreed on 9.  think we can get there with just lantus and perhaps oral meds.  she was already supposed to be taking glipizide (although stopped it along with her other meds).  will continue that.  increase lantus to  40.  stop the novolog.  will titrate up the lantus as far as we can without having lows.  she would like to get the lantus pen, which will make administration easier.  she agrees to return in about 2 weeks.  she is about to leave for an extended stay with her son in [place].   The following medications were removed from the medication list:    Novolog 100 Unit/ml Soln (Insulin aspart) ..... Inject 7 units subcutaneously three times a day before meals Her updated medication list for this problem includes:    Bayer Aspirin 325 Mg Tabs (Aspirin) .[name]... Take 1 tablet by mouth once a day    Diovan Hct 160-25 Mg Tabs (Valsartan-hydrochlorothiazide) .[name]... Take 1 tablet by mouth daily    Lantus Solostar 100 Unit/ml Soln (Insulin glargine) .Marland KitchenMarland KitchenMarland KitchenMarland Kitchen 45 units sub-cutaneously daily.  dispense quantity for 1 month    Glipizide 10 Mg Xr24h-tab (Glipizide) .[name]... 1 tab by mouth daily  Orders: A1C-[facility] (84696) [facility]- Est  Level 4 (29528)  Problem # 2:  HYPERTENSION, BENIGN SYSTEMIC (ICD-401.1) Assessment: Deteriorated needs to start back on her meds.  she agrees Her updated medication list for this problem includes:    Diovan Hct 160-25 Mg Tabs (Valsartan-hydrochlorothiazide) .[name]... Take 1 tablet by mouth daily    Toprol Xl 50 Mg Tb24 (Metoprolol succinate) .[name]... Take 1 tablet by mouth once a day  Orders: Comp Met-[facility] (41324-40102) [facility]- Est  Level 4 (72536)  Problem # 3:  HYPERLIPIDEMIA (ICD-272.4) Assessment: Unchanged due for ldl check.  needs to start back on simva Her updated medication list for this problem includes:    Simvastatin 40 Mg Tabs (Simvastatin) .[name]... Take 1 tablet by mouth once a day  Orders: Comp Met-[facility] (64403-47425) Direct LDL-[facility] (95638-75643) [facility]- Est  Level 4 (32951)  Problem # 4:  ? of DEPRESSION/ANXIETY (ICD-300.4) Assessment: New tearful today when discussing her family.  I think this is mostly situational.  she denies suicidal thoughts today; although she has a remote  history of suicidal thoughts.  since I have known her, she has never appearred depressed or endorsed feelings of depression.  she has always been erratic (since I have known her) about taking her medicines.  however, today is the first time that she has stopped them all completely and talked about not wanting to live a long time.  specifically, she is really worried about her daughter and does not want to outlive her.  I think we need to explore whether or not she is truly organically depressed vs reacting to a difficult family situation.    Problem # 5:  Preventive Health Care (ICD-V70.0) due for mammo--remind her at next visit  Complete Medication List: 1)  Bayer Aspirin 325 Mg Tabs (Aspirin) .... Take 1 tablet by mouth once a day 2)  Diovan Hct 160-25 Mg Tabs (Valsartan-hydrochlorothiazide) .... Take  1 tablet by mouth daily 3)  Lantus Solostar 100 Unit/ml Soln (Insulin glargine) .... 45 units sub-cutaneously daily.  dispense quantity for 1 month 4)  Levothroid 175 Mcg Tabs (Levothyroxine sodium) .... Take 1 tab by mouth daily 5)  Simvastatin 40 Mg Tabs (Simvastatin) .... Take 1 tablet by mouth once a day 6)  Toprol Xl 50 Mg Tb24 (Metoprolol succinate) .... Take 1 tablet by mouth once a day 7)  Glipizide 10 Mg Xr24h-tab (Glipizide) .[name].. 1 tab by mouth daily 8)  Pantoprazole Sodium 40 Mg Tbec (Pantoprazole sodium) .[name].. 1 tab by mouth daily for reflux; has failed trial of otc prilosec for 3 months and prescription omeprazole for 3 months  Patient Instructions: 1)  It was nice to see you today. 2)  For your diabetes, I prescribed you the lantus pen.  Take 45 units daily. 3)  STOP novolog. 4)  START taking the glipizide again. 5)  Your A1C goal is less than 9.0 6)  Check your sugars three times a day. 7)  Take your metoprolol and diovan for blood pressure. 8)  Please schedule a follow-up appointment before you leave to [place] and bring your sugar log. Prescriptions: FLUCONAZOLE 150 MG TABS  (FLUCONAZOLE) 1 tab by mouth X 1 for yeast  #1 x 0   Entered and Authorized by:   Asher Muir MD   Signed by:   Asher Muir MD on 11/12/2009   Method used:   Electronically to        [facility] (retail)       [address]       [place], [place]  [address]       Ph: [phone]       Fax: [phone]   RxID:   [id number] PANTOPRAZOLE SODIUM 40 MG TBEC (PANTOPRAZOLE SODIUM) 1 tab by mouth daily for reflux; has failed trial of otc prilosec for 3 months and prescription omeprazole for 3 months  #30 x 6   Entered and Authorized by:   Asher Muir MD   Signed by:   Asher Muir MD on 11/12/2009   Method used:   Electronically to        [facility] (retail)       [address]       [place], [place]  [id number]       Ph: [phone]       Fax: [phone]   RxID:   [id number] GLIPIZIDE 10 MG XR24H-TAB (GLIPIZIDE) 1 tab by mouth daily  #30 x 6   Entered and Authorized by:   Asher Muir MD   Signed by:   Asher Muir MD on 11/12/2009   Method used:   Electronically to        [facility] (retail)       [address]       [place], [place]  [address]       Ph: [phone]       Fax: [phone]   RxID:   [id number] TOPROL XL 50 MG TB24 (METOPROLOL SUCCINATE) Take 1 tablet by mouth once a day  #30 x 6   Entered and Authorized by:   Asher Muir MD   Signed by:   Asher Muir MD on 11/12/2009   Method used:   Electronically to        [facility] (retail)       [address]       [place],  [place]  [address]       Ph: [phone]       Fax: [phone]   RxID:   [id number] SIMVASTATIN 40 MG TABS (SIMVASTATIN) Take 1 tablet by mouth once a day  #30 x 6   Entered and Authorized by:   Asher Muir MD   Signed by:   Asher Muir MD on 11/12/2009   Method used:   Electronically to        [facility] (retail)       [address]       [place], [place]  [address]       Ph: [phone]       Fax: [phone]   RxID:   [id number] LEVOTHROID 175 MCG TABS (LEVOTHYROXINE SODIUM) take 1 tab by mouth daily  #30 x 6   Entered and Authorized by:   Asher Muir MD   Signed by:   Asher Muir MD on 11/12/2009   Method used:   Electronically to        [facility] (retail)       [address]       [place], [place]  [address]       Ph: [phone]       Fax: [phone]   RxID:   [id number] LANTUS SOLOSTAR 100 UNIT/ML SOLN (INSULIN GLARGINE) 45 units sub-cutaneously daily.  dispense quantity for 1 month  #1 x 6   Entered and Authorized by:   Asher Muir MD   Signed by:   Asher Muir MD on 11/12/2009   Method used:   Electronically to        [facility] (retail)       [address]       [place], [place]  [address]       Ph: [phone]       Fax: [phone]   RxID:   [id number] DIOVAN HCT 160-25 MG TABS (VALSARTAN-HYDROCHLOROTHIAZIDE) take 1 tablet by mouth daily  #30 x 6   Entered and Authorized by:   Asher Muir MD   Signed by:   Asher Muir MD on 11/12/2009   Method used:   Electronically to        [facility] (retail)       [address]       [place], [place]  [address]       Ph: [phone]       Fax: [phone]   RxID:   [id number]   Laboratory Results   Blood Tests   Date/Time Received: November 12, 2009 10:31 AM  Date/Time Reported: November 12, 2009 10:47 AM   HGBA1C: 13.4%   (Normal Range: Non-Diabetic - 3-6%   Control Diabetic - 6-8%)  Comments: .......test performed by........[name] [place], SMA      Prevention & Chronic Care Immunizations   Influenza vaccine: Fluvax Non-MCR  (07/09/2008)   Influenza vaccine due: 07/09/2009    Tetanus booster: 09/11/2008: given   Tetanus booster due: 09/11/2018    Pneumococcal  vaccine: Done.  (06/28/2005)   Pneumococcal vaccine due: None    H. zoster vaccine: 09/11/2008: refused  Colorectal Screening   Hemoccult: Done.  (04/02/2005)   Hemoccult due: Not Indicated    Colonoscopy: Done.  (07/03/2005)   Colonoscopy due: 07/04/2015  Other Screening   Pap smear: Done.  (08/03/2005)   Pap smear due: 08/03/2006    Mammogram: normal  (01/02/2008)   Mammogram due: 01/01/2009  DXA bone density scan: Not documented   Smoking status: quit > 6 months  (11/12/2009)  Diabetes Mellitus   HgbA1C: 13.4  (11/12/2009)   Hemoglobin A1C due: 09/22/2007    Eye exam: Not documented    Foot exam: yes  (03/22/2009)   High risk foot: Not documented   Foot care education: Not documented   Foot exam due: 05/16/2008    Urine microalbumin/creatinine ratio: Not documented    Diabetes flowsheet reviewed?: Yes   Progress toward A1C goal: Deteriorated    Stage of readiness to change (diabetes management): Contemplation  Lipids   Total Cholesterol: 180  (01/10/2009)   LDL: 110  (01/10/2009)   LDL Direct: Not documented   HDL: 47  (01/10/2009)   Triglycerides: 116  (01/10/2009)    SGOT (AST): Not documented   SGPT (ALT): Not documented CMP ordered    Alkaline phosphatase: Not documented   Total bilirubin: Not documented    Lipid flowsheet reviewed?: Yes   Progress toward LDL goal: Unchanged    Stage of readiness to change (lipid management): Precontemplation  Hypertension   Last Blood Pressure: 153 / 89  (11/12/2009)   Serum creatinine: 1.16  (07/09/2008)   Serum potassium 3.8  (07/09/2008) CMP ordered     Hypertension flowsheet reviewed?: Yes   Progress toward BP goal: Deteriorated    Stage of readiness to change (hypertension management): Contemplation  Self-Management Support :   Personal Goals (by the next clinic visit) :     Personal A1C goal: 8  (07/03/2009)     Personal blood pressure goal: 130/80  (07/03/2009)     Personal LDL goal: 100   (07/03/2009)    Diabetes self-management support: Copy of home glucose meter record, CBG self-monitoring log, Written self-care plan  (11/12/2009)   Diabetes care plan printed    Hypertension self-management support: BP self-monitoring log, Written self-care plan  (11/12/2009)   Hypertension self-care plan printed.    Lipid self-management support: Lipid monitoring log, Written self-care plan  (11/12/2009)   Lipid self-care plan printed.

## 2010-10-28 NOTE — Letter (Signed)
Summary: Generic Letter  [facility]  [address]   [place], [place] [address]   Phone: [phone]  Fax: [phone]    08/14/2010  JACOYA BAUMAN [address] [place], [place]  [address]  Dear Ms. Yetta Barre,   I have made an appointment for you on Tuesday August 19, 2010 at 9:00am with [facility].  Please bring your insurance card, medications, and your glasses.  Their office is located at [address], [place].  If you cannot keep this appointment please call their office at [phone] at least 24 hours in advance to cancel or reschedule your appointment.   Sincerely,   Loralee Pacas CMA

## 2010-10-28 NOTE — Progress Notes (Signed)
Summary: phn msg  Phone Note Call from Patient Call back at Home Phone [phone]   Caller: Patient Summary of Call: pt needs to talk to [place] about being referred to Bariatric clinic Initial call taken by: De Nurse,  August 18, 2010 3:44 PM  Follow-up for Phone Call        I believe she does not need a referral. I think she can go to one of the info sessions and find out if she is qualified with out a referral. I know [facility] does the bariatric surgery so she may be able to call them to find out about the info sessions.  Please let the patient know.  Follow-up by: Jamie Brookes MD,  August 18, 2010 7:19 PM  Additional Follow-up for Phone Call Additional follow up Details #1::        lvm to inform pt to call [facility] to enroll in seminar [phone] Additional Follow-up by: Jimmy Footman, CMA,  August 19, 2010 11:07 AM

## 2010-10-28 NOTE — Letter (Signed)
Summary: Generic Letter  [facility]  [address]   [place], [place] [address]   Phone: [phone]  Fax: [phone]    03/24/2010  TANDA MORRISSEY [address] [place], [place]  [address]  Dear Ms. Yetta Barre,   I realized after you left that we did not do any labs today.  You are due for a thyroid test.  If you have a chance to come by the lab and  get that done in the next few weeks, please do.  Otherwise, we will check it at your next appointment.  Thanks.  It was a pleasure taking care of you.       Sincerely,   Asher Muir MD  Appended Document: Generic Letter mailed

## 2010-10-28 NOTE — Letter (Signed)
Summary: Generic Letter  [facility]  [address]   [place], [place] [address]   Phone: [phone]  Fax: [phone]    08/18/2010  Kristine Boyer [address] [place], [place]  [address]  Dear [name],  our office has made several attempts to contact you by phone concerning your lab results.  Your Thyroid results are normal.  Please contact our office with an updated phone number.  Thank you for your time and attention to this matter.   Sincerely,   Loralee Pacas CMA

## 2010-10-28 NOTE — Miscellaneous (Signed)
Summary: QI project  Clinical Lists Changes  Problems: Changed problem from CHF - EJECTION FRACTION < 50% (ICD-428.22) to CHRONIC SYSTOLIC HEART FAILURE (ICD-428.22)

## 2010-10-28 NOTE — Progress Notes (Signed)
Summary: results of thyroid  Phone Note Outgoing Call   Call placed by: Jimmy Footman, CMA,  August 13, 2010 8:39 AM Call placed to: Patient Summary of Call: lvm for pt to call back to inform that her thyroid labs were normal  Follow-up for Phone Call        lvm for pt to return call Follow-up by: Loralee Pacas CMA,  August 13, 2010 12:53 PM  Additional Follow-up for Phone Call Additional follow up Details #1::        lvm for pt to return call Additional Follow-up by: Loralee Pacas CMA,  August 15, 2010 11:11 AM    Additional Follow-up for Phone Call Additional follow up Details #2::    lvm for pt (letter sent) Follow-up by: Loralee Pacas CMA,  August 18, 2010 10:21 AM

## 2010-10-28 NOTE — Assessment & Plan Note (Signed)
Summary: DM2, HTN, back pain, Thyroid.   Vital Signs:  Patient profile:   70 year old female Weight:      214.3 pounds Temp:     98.5 degrees F oral Pulse rate:   77 / minute Pulse rhythm:   regular BP sitting:   161 / 92  (left arm) Cuff size:   large  Vitals Entered By: Loralee Pacas CMA (August 12, 2010 2:21 PM) CC: DM2, HTN, Thyroid, back pain Comments thyroid and ? yeast infection   Primary Care Zavian Slowey:  Jamie Brookes MD  CC:  DM2, HTN, Thyroid, and back pain.  History of Present Illness: DM2: Has not been exercising regularily but is planning to do a dance exercise class or Silver Sneakers. Pt's meter is broken so she has not been checking her CBG's. She is taking Lantus 58 units. She says that she is checking her feet regularily and occasionally has numbness. Her nails are thickened but she does not want to take the oral meds because of effects on her liver and kidneys.   Hypertension: Pt has elevated BP today. It has been elevated in the past. She is taking 3 BP meds but after discussing it with her we decided to increase one of the meds.   Thyroid: Pt was sent a letter by her prior PCP to have her Thyroid checked. She wants to get her blood checked today.    Back pain: Pt has long standing back pain s/p a fall in 1994. She has a hard time sitting on the exam table. She says that Darvacet use to work for her but since they took it off the market she has been taking Tramadol and it doesn't work as well.   Habits & Providers  Alcohol-Tobacco-Diet     Tobacco Status: quit     Tobacco Counseling: to quit use of tobacco products     Year Quit: 1990  Exercise-Depression-Behavior     Does Patient Exercise: no     Exercise Counseling: to improve exercise regimen     Have you felt down or hopeless? no     Have you felt little pleasure in things? no     Depression Counseling: not indicated; screening negative for depression     Seat Belt Use: always  Current  Medications (verified): 1)  Bayer Aspirin 325 Mg Tabs (Aspirin) .... Take 1 Tablet By Mouth Once A Day 2)  Diovan Hct 160-25 Mg Tabs (Valsartan-Hydrochlorothiazide) .... Take 1 Tablet By Mouth Daily 3)  Lantus Solostar 100 Unit/ml Soln (Insulin Glargine) .... 60 Units Sub-Cutaneously Daily.  Dispense Quantity For 1 Month Give Qs For 1 Month 4)  Levothroid 175 Mcg Tabs (Levothyroxine Sodium) .... Take 1 Tab By Mouth Daily 5)  Simvastatin 40 Mg Tabs (Simvastatin) .... Take 1 Tablet By Mouth Once A Day 6)  Toprol Xl 50 Mg Tb24 (Metoprolol Succinate) .... Take 1 Tablet By Mouth Once A Day 7)  Glipizide 10 Mg Xr24h-Tab (Glipizide) .[name].. 1 Tab By Mouth Daily 8)  Pantoprazole Sodium 40 Mg Tbec (Pantoprazole Sodium) .[name].. 1 Tab By Mouth Daily For Reflux; Has Failed Trial of Otc Prilosec For 3 Months and Prescription Omeprazole For 3 Months 9)  Tramadol Hcl 50 Mg Tabs (Tramadol Hcl) .[name].. 1 Tab By Mouth Every 6 Hours As Needed Back Pain 10)  Toprol Xl 25 Mg Xr24h-Tab (Metoprolol Succinate) .... Take 1 Pill Every Day  Allergies (verified): No Known Drug Allergies  Social History: Does Patient Exercise:  no Seat Belt  Use:  always  Review of Systems        vitals reviewed and pertinent negatives and positives seen in HPI   Physical Exam  General:  Well-developed,well-nourished,in no acute distress; alert,appropriate and cooperative throughout examination Lungs:  Normal respiratory effort, chest expands symmetrically. Lungs are clear to auscultation, no crackles or wheezes. Heart:  Normal rate and regular rhythm. S1 and S2 normal without gallop, murmur, click, rub or other extra sounds.  Diabetes Management Exam:    Foot Exam (with socks and/or shoes not present):       Sensory-Monofilament:          Left foot: diminished          Right foot: normal       Inspection:          Left foot: normal          Right foot: normal       Nails:          Left foot: thickened          Right foot:  thickened   Impression & Recommendations:  Problem # 1:  DIABETES MELLITUS II, UNCOMPLICATED (ICD-250.00) Assessment Deteriorated Pt's A1c is worse, it has gone from 8 to 10.7. She is going to come in for more regular visits, get her meter fixed or let me know if she needs a new one, check her CBG's daily and bring in her meter and log in 3 months to see me again if not before.    Her updated medication list for this problem includes:    Bayer Aspirin 325 Mg Tabs (Aspirin) .[name]... Take 1 tablet by mouth once a day    Diovan Hct 160-25 Mg Tabs (Valsartan-hydrochlorothiazide) .[name]... Take 1 tablet by mouth daily    Lantus Solostar 100 Unit/ml Soln (Insulin glargine) .Marland KitchenMarland KitchenMarland KitchenMarland Kitchen 60 units sub-cutaneously daily.  dispense quantity for 1 month give qs for 1 month    Glipizide 10 Mg Xr24h-tab (Glipizide) .[name]... 1 tab by mouth daily  Orders: A1C-[facility] (16109) Ophthalmology Referral (Ophthalmology) [facility]- Est  Level 4 (60454)  Problem # 2:  HYPERTENSION, BENIGN SYSTEMIC (ICD-401.1) Assessment: Deteriorated Pt's BP is worse today. Plan to add Toprol XL 25 mg to her regimine. Will recheck her BP in 2 weeks.   Her updated medication list for this problem includes:    Diovan Hct 160-25 Mg Tabs (Valsartan-hydrochlorothiazide) .[name]... Take 1 tablet by mouth daily    Toprol Xl 50 Mg Tb24 (Metoprolol succinate) .[name]... Take 1 tablet by mouth once a day    Toprol Xl 25 Mg Xr24h-tab (Metoprolol succinate) .[name]... Take 1 pill every day  Orders: [facility]- Est  Level 4 (09811)  Problem # 3:  HYPOTHYROIDISM, UNSPECIFIED (ICD-244.9) Assessment: Unchanged Pt is getting her TSH checked today. I will call with med adjustments if necessary.   Her updated medication list for this problem includes:    Levothroid 175 Mcg Tabs (Levothyroxine sodium) .[name]... Take 1 tab by mouth daily  Orders: [facility]- Est  Level 4 (91478)  Problem # 4:  BACK PAIN, LUMBAR (ICD-724.2) Assessment: Unchanged Pt is taking Tramadol. She says it doesn't work  as well as Designer, multimedia but is Ok.   Her updated medication list for this problem includes:    Bayer Aspirin 325 Mg Tabs (Aspirin) .[name]... Take 1 tablet by mouth once a day    Tramadol Hcl 50 Mg Tabs (Tramadol hcl) .[name]... 1 tab by mouth every 6 hours as needed back pain  Orders: [facility]- Est  Level 4 (99214)  Complete Medication List: 1)  Bayer Aspirin 325 Mg Tabs (Aspirin) .... Take 1 tablet by mouth once a day 2)  Diovan Hct 160-25 Mg Tabs (Valsartan-hydrochlorothiazide) .... Take 1 tablet by mouth daily 3)  Lantus Solostar 100 Unit/ml Soln (Insulin glargine) .... 60 units sub-cutaneously daily.  dispense quantity for 1 month give qs for 1 month 4)  Levothroid 175 Mcg Tabs (Levothyroxine sodium) .... Take 1 tab by mouth daily 5)  Simvastatin 40 Mg Tabs (Simvastatin) .... Take 1 tablet by mouth once a day 6)  Toprol Xl 50 Mg Tb24 (Metoprolol succinate) .... Take 1 tablet by mouth once a day 7)  Glipizide 10 Mg Xr24h-tab (Glipizide) .[name].. 1 tab by mouth daily 8)  Pantoprazole Sodium 40 Mg Tbec (Pantoprazole sodium) .[name].. 1 tab by mouth daily for reflux; has failed trial of otc prilosec for 3 months and prescription omeprazole for 3 months 9)  Tramadol Hcl 50 Mg Tabs (Tramadol hcl) .[name].. 1 tab by mouth every 6 hours as needed back pain 10)  Toprol Xl 25 Mg Xr24h-tab (Metoprolol succinate) .... Take 1 pill every day  Other Orders: Dental Referral (Dentist) Influenza Vaccine MCR [phone])  Patient Instructions: 1)  Increase your Lantus to 60 units.  2)  Take the extra Toprol to get better BP control. 3)  Come back in 2 weeks to get your BP checked.  4)  Be careful during the holidays.  5)  Get your meter checked and if you need a new one, let me know.  6)  Come back to see me in 3 months at least.  Prescriptions: TOPROL XL 25 MG XR24H-TAB (METOPROLOL SUCCINATE) take 1 pill every day  #31 x 6   Entered and Authorized by:   Jamie Brookes MD   Signed by:   Jamie Brookes MD on 08/12/2010   Method  used:   Electronically to        [facility]* (retail)       [address]       [place], [place]  [id number]       Ph: [phone]       Fax: [phone]   RxID:   [id number] TRAMADOL HCL 50 MG TABS (TRAMADOL HCL) 1 tab by mouth every 6 hours as needed back pain  #90 x 3   Entered and Authorized by:   Jamie Brookes MD   Signed by:   Jamie Brookes MD on 08/12/2010   Method used:   Electronically to        [facility]* (retail)       [address]       [place], [place]  [id number]       Ph: [phone]       Fax: [phone]   RxID:   [id number] LANTUS SOLOSTAR 100 UNIT/ML SOLN (INSULIN GLARGINE) 60 units sub-cutaneously daily.  dispense quantity for 1 month Give QS for 1 month  #1 x 6   Entered and Authorized by:   Jamie Brookes MD   Signed by:   Jamie Brookes MD on 08/12/2010   Method used:   Electronically to        [facility]* (retail)       [address]       [place], [place]  [id number]       Ph: [phone]       Fax: [phone]   RxID:   [id number]    Orders  Added: 1)  A1C-[facility] [83036] 2)  Ophthalmology Referral [Ophthalmology] 3)  Dental Referral [Dentist] 4)  Influenza Vaccine MCR [00025] 5)  [facility]- Est  Level 4 [70350]   Immunizations Administered:  Influenza Vaccine # 1:    Vaccine Type: Fluvax MCR    Site: right deltoid    Mfr: GlaxoSmithKline    Dose: 0.5 ml    Route: IM    Given by: Loralee Pacas CMA    Exp. Date: 03/28/2011    Lot #: [id number]    VIS given: 04/22/10 version given August 12, 2010.  Flu Vaccine Consent Questions:    Do you have a history of severe allergic reactions to this vaccine? no    Any prior history of allergic reactions to egg and/or gelatin? no    Do you have a sensitivity to the preservative Thimersol? no    Do you have a past history of Guillan-Barre Syndrome? no    Do you currently have an acute febrile illness? no     Have you ever had a severe reaction to latex? no    Vaccine information given and explained to patient? yes    Are you currently pregnant? no   Immunizations Administered:  Influenza Vaccine # 1:    Vaccine Type: Fluvax MCR    Site: right deltoid    Mfr: GlaxoSmithKline    Dose: 0.5 ml    Route: IM    Given by: Loralee Pacas CMA    Exp. Date: 03/28/2011    Lot #: [id number]    VIS given: 04/22/10 version given August 12, 2010.    Prevention & Chronic Care Immunizations   Influenza vaccine: Fluvax MCR  (08/12/2010)   Influenza vaccine due: 07/09/2009    Tetanus booster: 09/11/2008: given   Tetanus booster due: 09/11/2018    Pneumococcal vaccine: Done.  (06/28/2005)   Pneumococcal vaccine due: None    H. zoster vaccine: 09/11/2008: refused  Colorectal Screening   Hemoccult: Done.  (04/02/2005)   Hemoccult due: Not Indicated    Colonoscopy: Done.  (07/03/2005)   Colonoscopy due: 07/04/2015  Other Screening   Pap smear: Done.  (08/03/2005)   Pap smear action/deferral: Not indicated-other  (03/24/2010)   Pap smear due: 08/03/2006    Mammogram: normal  (01/02/2008)   Mammogram action/deferral: Refused  (03/24/2010)   Mammogram due: 01/01/2009    DXA bone density scan: Not documented   Smoking status: quit  (08/12/2010)  Diabetes Mellitus   HgbA1C: 10.4  (08/12/2010)   Hemoglobin A1C due: 09/22/2007    Eye exam: Not documented    Foot exam: yes  (08/12/2010)   Foot exam action/deferral: Do today   High risk foot: Not documented   Foot care education: Not documented   Foot exam due: 05/16/2008    Urine microalbumin/creatinine ratio: Not documented    Diabetes flowsheet reviewed?: Yes   Progress toward A1C goal: Deteriorated  Lipids   Total Cholesterol: 180  (01/10/2009)   LDL: 110  (01/10/2009)   LDL Direct: 91  (11/12/2009)   HDL: 47  (01/10/2009)   Triglycerides: 116  (01/10/2009)    SGOT (AST): 21  (11/12/2009)   SGPT (ALT): 16   (11/12/2009)   Alkaline phosphatase: 122  (11/12/2009)   Total bilirubin: 0.5  (11/12/2009)    Lipid flowsheet reviewed?: Yes   Progress toward LDL goal: Unchanged  Hypertension   Last Blood Pressure: 161 / 92  (08/12/2010)   Serum creatinine: 0.92  (11/12/2009)   Serum potassium 3.6  (11/12/2009)  Hypertension flowsheet reviewed?: Yes   Progress toward BP goal: Deteriorated  Self-Management Support :   Personal Goals (by the next clinic visit) :     Personal A1C goal: 8  (07/03/2009)     Personal blood pressure goal: 130/80  (07/03/2009)     Personal LDL goal: 100  (07/03/2009)    Patient will work on the following items until the next clinic visit to reach self-care goals:     Medications and monitoring: take my medicines every day, check my blood sugar, check my blood pressure, bring all of my medications to every visit, examine my feet every day  (08/12/2010)     Eating: eat more vegetables, use fresh or frozen vegetables, eat foods that are low in salt  (08/12/2010)     Activity: take a 30 minute walk every day, join a walking program  (08/12/2010)    Diabetes self-management support: Written self-care plan  (08/12/2010)   Diabetes care plan printed    Hypertension self-management support: Written self-care plan  (08/12/2010)   Hypertension self-care plan printed.    Lipid self-management support: Written self-care plan  (08/12/2010)   Lipid self-care plan printed.   Nursing Instructions: Diabetic foot exam today     Laboratory Results   Blood Tests   Date/Time Received: August 12, 2010 1:34 PM  Date/Time Reported: August 12, 2010 2:59 PM   HGBA1C: 10.4%   (Normal Range: Non-Diabetic - 3-6%   Control Diabetic - 6-8%)

## 2010-11-11 ENCOUNTER — Encounter: Payer: Self-pay | Admitting: Family Medicine

## 2010-11-11 ENCOUNTER — Ambulatory Visit (INDEPENDENT_AMBULATORY_CARE_PROVIDER_SITE_OTHER): Payer: Medicare HMO | Admitting: Family Medicine

## 2010-11-11 VITALS — BP 173/103 | HR 74 | Temp 98.4°F | Wt 208.8 lb

## 2010-11-11 DIAGNOSIS — M79671 Pain in right foot: Secondary | ICD-10-CM | POA: Insufficient documentation

## 2010-11-11 DIAGNOSIS — M79609 Pain in unspecified limb: Secondary | ICD-10-CM

## 2010-11-11 DIAGNOSIS — I10 Essential (primary) hypertension: Secondary | ICD-10-CM

## 2010-11-11 HISTORY — DX: Pain in right foot: M79.671

## 2010-11-11 LAB — BASIC METABOLIC PANEL
BUN: 11 mg/dL (ref 6–23)
Calcium: 8.9 mg/dL (ref 8.4–10.5)
Chloride: 98 mEq/L (ref 96–112)
Creat: 1.08 mg/dL (ref 0.40–1.20)
Glucose, Bld: 300 mg/dL — ABNORMAL HIGH (ref 70–99)
Potassium: 3.3 mEq/L — ABNORMAL LOW (ref 3.5–5.3)
Sodium: 137 mEq/L (ref 135–145)

## 2010-11-11 LAB — URIC ACID: Uric Acid, Serum: 5.8 mg/dL (ref 2.4–7.0)

## 2010-11-11 MED ORDER — OXYCODONE-ACETAMINOPHEN 5-325 MG PO TABS: 1.0000 | ORAL_TABLET | Freq: Four times a day (QID) | ORAL | Status: DC | PRN

## 2010-11-11 NOTE — Patient Instructions (Signed)
Thanks for coming in today.  Go get a post op shoe at a medical supply store ([employer]) Take percocet as needed.  See Dr. Clotilde Dieter in 2-3 weeks for follow up.  We will get some labs today.

## 2010-11-11 NOTE — Assessment & Plan Note (Signed)
Uncontrolled today but not symptomatic.  Plan to advise pt to restart her medications and follow up with PCP in 2 weeks. Gave red flags.  Stressed the importance of BP control.

## 2010-11-11 NOTE — Progress Notes (Signed)
Foot pain and swelling: Kristine Boyer recently started on a increased walking program 2 weeks ago. She essentially was not exercising prior to this start.  1 day ago Kristine Boyer noted acute right foot pain and swelling. She is concerned that she may have gout. She has never been diagnosed with gout before. She additionally would like some pain medications. No fevers or chills.   HTN: Not been taking any medications in a few weeks. Will restart after today's visit. Some dizzyness. No chest pain or palpitations. Feels well otherwise. Was trying a walking program to better control BP without medications.   ROS as above:  EXAM: VS noted.  Gen: Obese NAD Lungs: CTABL Heart: RRR no MRG Ext: Right foot -- Swelling over 3rd and 4th metatarsal heals. TTP over same area. Tender to toe ROM. Not tender and no swelling over 1st MTP joint.

## 2010-11-11 NOTE — Assessment & Plan Note (Signed)
Not sure if gout or stress fracture: I do not think an X-ray would be helpful at this time as it would likely be negative in an early stress fracture vs stress reaction.  Plan: Obtain an uric acid and BMP. Place pt in a post op shoe.  Will f/u with PCP in 2-3 weeks.  If gout not obvious based on labs would recommend obtaining a X-ray series of the foot to eval for stress fracture and if positive continue the post op shoe for a total of 4-6 weeks.

## 2010-11-12 ENCOUNTER — Encounter: Payer: Self-pay | Admitting: Family Medicine

## 2010-11-12 ENCOUNTER — Telehealth: Payer: Self-pay | Admitting: Family Medicine

## 2010-11-13 ENCOUNTER — Telehealth: Payer: Self-pay | Admitting: Family Medicine

## 2010-11-17 NOTE — Telephone Encounter (Signed)
Encounter created in error

## 2010-12-01 ENCOUNTER — Ambulatory Visit
Admission: RE | Admit: 2010-12-01 | Discharge: 2010-12-01 | Disposition: A | Discharge status: Home / Self Care | Payer: Medicare HMO | Source: Ambulatory Visit | Attending: Family Medicine | Admitting: Family Medicine

## 2010-12-01 ENCOUNTER — Telehealth: Payer: Self-pay | Admitting: Family Medicine

## 2010-12-01 ENCOUNTER — Encounter: Payer: Self-pay | Admitting: Family Medicine

## 2010-12-01 ENCOUNTER — Ambulatory Visit (INDEPENDENT_AMBULATORY_CARE_PROVIDER_SITE_OTHER): Payer: Medicare HMO | Admitting: Family Medicine

## 2010-12-01 VITALS — BP 165/86 | HR 69 | Temp 98.3°F | Wt 208.3 lb

## 2010-12-01 DIAGNOSIS — M79671 Pain in right foot: Secondary | ICD-10-CM

## 2010-12-01 DIAGNOSIS — M79609 Pain in unspecified limb: Secondary | ICD-10-CM

## 2010-12-01 DIAGNOSIS — E119 Type 2 diabetes mellitus without complications: Secondary | ICD-10-CM

## 2010-12-01 LAB — POCT GLYCOSYLATED HEMOGLOBIN (HGB A1C): Hemoglobin A1C: 11.5

## 2010-12-01 NOTE — Patient Instructions (Signed)
Go to [facility] or [facility] imaging to get xray of foot.  Continue to wear the post-op shoe for 2 more weeks. Then come back to be evaluated in 2 weeks.

## 2010-12-04 NOTE — Progress Notes (Signed)
  Subjective:    Patient ID: Kristine Boyer, female    DOB: 09/24/1941, 70 y.o.   MRN: [id number]  HPI  Pt comes in with Rt foot pain. She was evaluated by Dr. Logan Bores a few weeks ago and is here for follow up. She has been wearing the post-op shoe off and on (not as much as she should be). She has been helping in a housing move and a shelving board fell on her foot. She continues to have pain and did use 1 pain pill (Percocet) 2 nights ago. No swelling, when she sits she elevates the foot.   Review of Systems  Musculoskeletal: Positive for arthralgias. Negative for joint swelling.  All other systems reviewed and are negative.       Objective:   Physical Exam  Musculoskeletal: She exhibits no edema.       Tenderness over the 3rd and 4th metatarsals. No swelling, no bruising, normal cap refill, normal sensation. Pt can move all toes.   Skin: Skin is warm and dry. No erythema.          Assessment & Plan:

## 2010-12-04 NOTE — Assessment & Plan Note (Signed)
Pt still having some foot pain. Plan to get xray to rule out fracture. Will continue to keep her in the post op shoe for at least 2 more weeks or until she can hop on the foot without pain.

## 2010-12-09 NOTE — Telephone Encounter (Signed)
Opened an note in error

## 2010-12-11 ENCOUNTER — Telehealth: Payer: Self-pay | Admitting: Family Medicine

## 2010-12-11 NOTE — Telephone Encounter (Signed)
Please call this patient ask her if she has ordered a back and ankle brace from a company in [place]? I have gotten a request for both of them for her and I'm concerned this is a scam. Thanks.

## 2010-12-12 NOTE — Telephone Encounter (Signed)
Amber, I spoke with Ms. Ohanian and she states that she has not ordered these. She did say she talked with a company and she told them that her back and ankles do hurt her, but she did not want the braces. [place] Dec

## 2010-12-17 NOTE — Telephone Encounter (Signed)
This is just as i suspected. Thanks.

## 2010-12-31 ENCOUNTER — Encounter: Payer: Self-pay | Admitting: Home Health Services

## 2011-01-15 ENCOUNTER — Other Ambulatory Visit: Payer: Self-pay | Admitting: Family Medicine

## 2011-01-15 ENCOUNTER — Telehealth: Payer: Self-pay | Admitting: Family Medicine

## 2011-01-15 MED ORDER — PANTOPRAZOLE SODIUM 40 MG PO TBEC: 40.0000 mg | DELAYED_RELEASE_TABLET | Freq: Every day | ORAL | Status: DC

## 2011-01-15 NOTE — Telephone Encounter (Signed)
Will be faxed on 01-16-11. Please let her know. Thanks.

## 2011-01-15 NOTE — Telephone Encounter (Signed)
Needs refill on metoprolol and pantopazole - states [facility] states they have faxed the script on 4/4 Needs asap please

## 2011-01-16 NOTE — Telephone Encounter (Signed)
Pt is asking about the metoprolol  Has that been sent in?

## 2011-01-19 NOTE — Telephone Encounter (Signed)
i spoke with [facility] and they can fill both of these meds next week but say that they were last filled on 01-03-11 and picked up and since the insurance company is not going to pay for them sooner than once monthly she can pick them up after beginning of  May. Please let her know. Thanks.  Hospital doctor

## 2011-01-19 NOTE — Telephone Encounter (Signed)
LVM for patient to call back. ?

## 2011-01-20 NOTE — Telephone Encounter (Signed)
LVM for patient explaining the below message and number to call back if needing to speak about anything else

## 2011-01-20 NOTE — Telephone Encounter (Signed)
Pt calling back about rx for metoprolol, she said the 1st one prescribed was for the lower dose, then MD increased her dosage b/c her bp was up so she needs the increased dosage refilled, please call pt so she can explain.

## 2011-01-21 ENCOUNTER — Telehealth: Payer: Self-pay | Admitting: Family Medicine

## 2011-01-21 ENCOUNTER — Other Ambulatory Visit: Payer: Self-pay | Admitting: Family Medicine

## 2011-01-21 MED ORDER — METOPROLOL SUCCINATE ER 50 MG PO TB24: 50.0000 mg | ORAL_TABLET | Freq: Every day | ORAL | Status: DC

## 2011-01-21 MED ORDER — PANTOPRAZOLE SODIUM 40 MG PO TBEC: 40.0000 mg | DELAYED_RELEASE_TABLET | Freq: Every day | ORAL | Status: DC

## 2011-01-21 NOTE — Telephone Encounter (Signed)
Called the patient and left her a message about her Metroprolol. I have in our records that she is taking a 25 mg and 50 mg tablet of Toprol XL daily. Asked her to give [place] a call if she is taking something differntly.

## 2011-01-22 NOTE — Telephone Encounter (Signed)
I saw where the patient was on 2 different doses. I refilled the 50 mg dose. Please let the patient know. Thanks.

## 2011-01-22 NOTE — Telephone Encounter (Signed)
Spoke with patient and informed her of below 

## 2011-02-10 ENCOUNTER — Encounter: Payer: Self-pay | Admitting: Home Health Services

## 2011-02-10 ENCOUNTER — Ambulatory Visit (INDEPENDENT_AMBULATORY_CARE_PROVIDER_SITE_OTHER): Payer: Medicare HMO | Admitting: Home Health Services

## 2011-02-10 VITALS — BP 157/93 | HR 78 | Temp 98.8°F | Ht 64.0 in | Wt 208.8 lb

## 2011-02-10 DIAGNOSIS — Z0000 Encounter for general adult medical examination without abnormal findings: Secondary | ICD-10-CM

## 2011-02-10 NOTE — Progress Notes (Signed)
Patient here for annual wellness visit, patient reports: Risk Factors/Conditions needing evaluation or treatment: Patient does not have any risk factors that need evaluation.  Home Safety: Patient lives with family in 1 story home. Patient reports having smoke detectors and carbon monoxide detectors.  Patient does not have adaptive equipment in bathroom.  Other Information: Corrective lens: Patient does not wear corrective lens but reports needing them. Dentures: Patient does not have dentures but reports needing them. Memory: Patient reports some memory problems. Patient's Mini Mental Score (recorded in doc. flowsheet): 30  Balance/Gait:Patient has a fairly steady gait but drags heels when walking.  Patient has a lot of pain in knees and must take her time to in rising.  Patient reports dizziness when eyes are closed and can not rotate neck due to back pain. Balance Abnormal Patient value  Sitting balance    Arise x Must use arms  Attempts to arise    Immediate standing balance    Standing balance    Nudge    Eyes closed x dizziness  360 degree turn    Sitting down     Gait Abnormal Patient value  Initiation of gait    Step length-left    Step length-right    Step height-left x Drags heel  Step height-right x Drags heel   Step symmetry    Step continuity    Path    Trunk    Walking stance        Annual Wellness Visit Requirements Recorded Today In  Medical, family, social history Past Medical, Family, Social History Section  Current providers Care team  Current medications Medications  Wt, BP, Ht, BMI Vital signs  Hearing assessment (welcome visit) Declined-patient reports having already tested hearing and it was recommended she get a hearing aid.  Patient reports not being able to afford aid.   Tobacco, alcohol, illicit drug use History  ADL Nurse Assessment  Depression Screening Nurse Assessment  Cognitive impairment Nurse Assessment  Mini Mental Status Document  Flowsheet  Fall Risk Nurse Assessment  Home Safety Progress Note  End of Life Planning (welcome visit) Social Documentation  Medicare preventative services Progress Note  Risk factors/conditions needing evaluation/treatment Progress Note  Personalized health advice Patient Instructions, goals, letter  Diet & Exercise Social Documentation  Emergency Contact Social Documentation  Seat Belts Social Documentation  Sun exposure/protection Social Documentation    Prevention Plan: Recommended patient schedule a mammogram and bone density screening.  Also suggested she contact her pharmacy for shingles vaccine.  Recommended Medicare Prevention Screenings Women over 30 Test For Frequency Date of Last- BOLD if needed  Breast Cancer 1-2 yrs 4/09  Cervical Cancer 1-3 yrs hysterectomy  Colorectal Cancer 1-10 yrs 10/06  Osteoporosis once recommended  Cholesterol 5 yrs 4/10  Diabetes yearly 3/12  HIV yearly declined  Influenza Shot yearly 11/11  Pneumonia Shot once 10/06  Zostavax Shot once recommended

## 2011-02-10 NOTE — Patient Instructions (Signed)
1. Continue working on weight loss. 2. Focus on eating 3-4 vegetables a day! 3. Schedule mammogram 4. Discuss end of life medical wishes with family. 5. Focus on moving more like walking 2 times a week or dancing or swimming.

## 2011-02-11 ENCOUNTER — Ambulatory Visit: Payer: Medicare HMO | Admitting: Family Medicine

## 2011-02-12 NOTE — Progress Notes (Signed)
I have reviewed this visit and discussed with Suzanne Lineberry and agree with her documentation  

## 2011-02-13 NOTE — Cardiovascular Report (Signed)
NAME:  Kristine Boyer, Kristine Boyer NO.:  [id number]   MEDICAL RECORD NO.:  [id number]          PATIENT TYPE:  OIB   LOCATION:  2854                         FACILITY:  [facility]   PHYSICIAN:  Georga Hacking, M.D.DATE OF BIRTH:  05-08-1941   DATE OF PROCEDURE:  08/27/2005  DATE OF DISCHARGE:  08/27/2005                              CARDIAC CATHETERIZATION   HISTORY:  A 70 year old female has a history of some coronary artery disease  previously and has had atypical chest pain recently. She has a previously  abnormal Cardiolite testing and is due to undergo major surgery.   PROCEDURE:  Left heart catheterization with coronary angiograms and left  ventriculogram.   COMMENTS ABOUT PROCEDURE:  The patient tolerated the procedure well without  complications. She had a single anterior needle wall stick of the right  femoral artery and at the end of the procedure she had good hemostasis and  peripheral pulses noted.   HEMODYNAMIC DATA:  Aorta postcontrast 150/83. LV postcontrast 150/8-18.   ANGIOGRAPHIC DATA:  Left ventriculogram: Performed in the 30 degrees RAO  projection. Aortic valve is normal. The mitral valve is normal. There is  near cavity obliteration of the apex. There is increased trabeculations  compatible with ventricular hypertrophy but the EF is normal and estimated  at 70%. Coronary arteries arise and distribute normally. No significant  coronary calcification noted. Left main coronary artery normal. Left  anterior descending: This is a large vessel extending to the apex. There are  minimal scattered irregularities at the apex. There is an area of 30%  narrowing but it does not appear as severe as previously noted. The  circumflex coronary artery has two marginal branches and it is moderately  tortuous and no significant focal obstructive stenoses are noted. Right  coronary is tortuous with no significant disease noted.   IMPRESSION:  1.  Very minimal coronary  artery disease involving the left anterior      descending artery with tortuous coronary arteries otherwise  2.  Concentric left ventricular hypertrophy with hyperdynamic function at      the apex and estimated ejection fraction of 70%.      Georga Hacking, M.D.  Electronically Signed     WST/MEDQ  D:  08/27/2005  T:  08/27/2005  Job:  [id number]   cc:   Gabriel Earing, M.D.  Fax: [phone]

## 2011-02-13 NOTE — Cardiovascular Report (Signed)
   NAME:  Kristine Boyer, Kristine Boyer                           ACCOUNT NO.:  [id number]   MEDICAL RECORD NO.:  [id number]                   PATIENT TYPE:  OIB   LOCATION:  2899                                 FACILITY:  [facility]   PHYSICIAN:  W. Ashley Royalty., M.D.         DATE OF BIRTH:  [birth year]   DATE OF PROCEDURE:  04/16/2003  DATE OF DISCHARGE:                              CARDIAC CATHETERIZATION   HISTORY:  A 70 year old female who has chest discomfort with some typical  other atypical features and previously had an abnormal Cardiolite scan.   PROCEDURE:  Cardiac catheterization.   DESCRIPTION OF PROCEDURE:  The patient was brought to the cath lab and was  prepped and draped in the usual manner.  Catheterization was done using 6  [place] sheaths and 6 French catheters.  The right femoral artery was entered  using a single anterior needle wall stick.  She tolerated the procedure well  without complications.  Please see the attached catheterization lob for  remainder of the details.   HEMODYNAMIC DATA:  1. Aorta postcontrast:  158/79.  2. LV postcontrast:  158/15-20.   ANGIOGRAPHIC DATA:  Left ventriculogram:  Performed in the 30 degree RAO  projection.  The aortic valve was normal.  The mitral valve was normal.  The  left ventricle appears normal in size.  The left ventricular ejection  fraction is estimated at 60-65%.  Coronary arteries arise and distribute  normally.  No significant coronary calcification is noted.  The left main  coronary appears normal.  The left anterior descending is a large vessel  that extends around the apex.  There is a moderate 50-60% stenosis in the  mid LAD which is eccentric.  The LAD is moderately tortuous.  The circumflex  coronary artery is tortuous, but does not contain any significant  obstructive stenoses noted.  Right coronary artery is also tortuous and also  does not contain significant obstructive disease.    IMPRESSION:  1. Moderate disease  involving the mid left anterior descending.  2. Normal left ventricular function with increased LVEDP.   PLAN:  Continued medical therapy, lose weight, control blood pressure.                                                 Darden Palmer., M.D.    WST/MEDQ  D:  04/16/2003  T:  04/16/2003  Job:  [id number]  Gabriel Earing, M.D.  [address]  [place]  [place] [address]  Fax: [phone]   cc:   Gabriel Earing, M.D.  [address]  [place]  [place] [address]  Fax: [phone]

## 2011-02-13 NOTE — Discharge Summary (Signed)
NAME:  Kristine Boyer, Kristine Boyer NO.:  [id number]   MEDICAL RECORD NO.:  [id number]          PATIENT TYPE:  INP   LOCATION:  4709                         FACILITY:  [facility]   PHYSICIAN:  Wayne A. Sheffield Slider, M.D.    DATE OF BIRTH:  Mar 08, 1941   DATE OF ADMISSION:  02/05/2005  DATE OF DISCHARGE:  02/10/2005                                 DISCHARGE SUMMARY   DISCHARGE DIAGNOSES:  1.  Weakness, resolved.  2.  C3-C4 disk protrusion with spondylosis.  3.  Hypertension.  4.  Diabetes mellitus, type 2.  5.  Hypothyroidism.  6.  Gastroesophageal reflux disease.  7.  Migraines.   DISCHARGE MEDICATIONS:  1.  Insulin 70/30, inject 20 units q.a.m. and 15 units q.p.m.  2.  Synthroid 100 mcg one p.o. every day.  3.  Lipitor 40 mg one p.o. q.h.s.  4.  Prednisone 20 mg two by mouth per day for two days after discharge.  5.  Hydrochlorothiazide 12.5 mg one p.o. every day.  6.  Lisinopril 10 mg one p.o. every day.  7.  Metoprolol 50 mg one p.o. b.i.d.  8.  Aspirin 325 mg one p.o. every day.  9.  Tylenol 1,000 mg p.o. q.8h. p.r.n. pain.   FOLLOWUP:  The patient is to follow up as [facility].  Please note  that the patient will need a neurosurgery evaluation as an outpatient for  her neck.  She will also need colonoscopy at some point to be considered.  She will need outpatient diabetic teaching.  She will also need a six week  TSH in followup.   PROCEDURES/DIAGNOSTIC STUDIES:  1.  EKG showing no acute ST changes.  2.  MR brain showing chronic ischemic changes, no acute abnormalities.  3.  MRI of neck:  Bilateral ACA and right MCA stenosis, moderate stenosis of      the right internal carotid artery, negative for aneurysm.  MR of neck:      Moderately large central disk protrusion C3-C4 with compression of the      cord and cord hyper intensity, canal measures 5.9-mm in diameter.      Spondylosis at C4-C5 and C5-C6 with mild to moderate spinal stenosis at      these levels.  4.   Head CT:  Age related cerebral atrophy and chronic microvascular      subcortical changes.  No acute intracranial abnormality.  5.  Carotid Doppler studies showing antegrade flow.   CONSULTANTS:  None.   ADMISSION HISTORY AND PHYSICAL:  The patient is a 70 year old female patient  with a history of diabetes, and hypertension, and hypothyroidism that  presented for an episode of feeling disconnected, sudden onset of left arm  shaking and cramping from the shoulder down, also shortness of breath and  diaphoresis, unsure of the time course.  She was brought in by EMS and was  admitted for concern of TIA.   HOSPITAL COURSE:  1.  The patient had concern for initial TIA.  She was ruled out for a MI.      Her neurologic exam  was stable during this hospitalization with no focal      deficits found excluding slight decreased hearing on the right side      which is old and unchanged.  The patient's presenting symptoms were not      consistent with seizure activity and given the patient's negative      imaging is unlikely the patient had a TIA.  Her pathology may be related      to problem number two.  The patient was not felt to have a TIA and      therefore, was not considered an aspirin failure and was continue on      aspirin.  2.  C3-C4 disk protrusion with spondylosis.  The patient was placed on      prednisone for an anti-inflammatory and managed symptomatically for pain      control.  She had no weakness nor hyporeflexia noted in the limbs.  She      was stable for discharge and will need neurosurgery evaluation in the      future as an outpatient.  3.  Hypertension.  The patient was uncontrolled as a hypertensive and was      restarted on medications listed above.  4.  Diabetes, type 2.  The patient was uncontrolled with a hemoglobin A1c of      15 and started on insulin listed above.  She was discontinued from her      oral medications.  5.  Hypothyroidism.  The patient has a history  of hypothyroidism and was      continued on 100 mcg per day.  She will need a repeat TSH in six weeks.  6.  Gastroesophageal reflux disease.  The patient has a history of GERD and      was treated with a PPI during this hospitalization.  7.  Migraines.  The patient has a history of migraines and was treated      symptomatically with __________ during this hospitalization, however,      because of her vascular risk for hypertension this was not continued on      an outpatient basis.  Hopefully, having the patient on a beta-blocker      she will have a higher threshold for migraines.  8.  The patient had a history of rectal bleed and can have an outpatient      colonoscopy/workup.   DISCHARGE LABS:  From Feb 10, 2005:  Basic metabolic panel within normal  limits with the exception of glucose of 312 and a BUN of 27.  Other notable  tests include the hemoglobin A1c of 15.2.  Cardiac enzymes decreasing from  presentation with a final value of troponin I of 0.04.  Total cholesterol  184, triglycerides 127, HDL 57, LDL 102.      GSD/MEDQ  D:  02/11/2005  T:  02/11/2005  Job:  [id number]   cc:   Primary physician  [facility]  [facility] A. Sheffield Slider, M.D.  Fax: [phone]

## 2011-02-13 NOTE — Consult Note (Signed)
NAME:  Kristine Boyer, Kristine Boyer NO.:  [id number]   MEDICAL RECORD NO.:  [id number]          PATIENT TYPE:  WOC   LOCATION:  WOC                          FACILITY:  [facility]   PHYSICIAN:  John T. Kyla Balzarine, M.D.    DATE OF BIRTH:  1941/04/28   DATE OF CONSULTATION:  08/12/2005  DATE OF DISCHARGE:                                   CONSULTATION   CHIEF COMPLAINT:  Complex right adnexal mass.   HISTORY OF PRESENT ILLNESS:  This 70 year old woman is seen at the request  of Dr. Elsie Lincoln for recommendations regarding management of a complex  adnexal mass. The patient developed a right lower quadrant pain and  ultrasound performed July 01, 2005 revealed a complex cystic lesion of the  right adnexa measuring 8.8 x 6.2 x 6.1 cm with echogenic nodular component  along the periphery. The left ovary was not well seen and there was a tiny  amount of free fluid in the cul-de-sac. The patient notes crampy pain  raising to 8 to 9/10 on occasion. She is aware of some bloating but denies  early satiety and has had dyspareunia. The patient had CA-125 of 7.8 and CEA  of 1.3. She underwent a follow-up ultrasound on July 06, 2005 which  revealed a normal uterus, right adnexa with a complex mass measuring 4.7 x  8.4 x 5.0 having mixed solid and cystic components with primary  intralesional solid component with marked vascularity. Left ovary was normal  and no free fluid seen.   PAST MEDICAL HISTORY:  1.  Significant for diabetes.  2.  Coronary artery disease.  3.  GERD.  4.  Hypertension.  5.  Hypothyroidism.  6.  Migraine headaches.  7.  Degenerative disk disease.  8.  Osteoarthritis.  9.  Has had episodes of congestive heart failure.   PAST SURGICAL HISTORY:  1.  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  2.  She underwent prior cardiac catheterization with questionable      angioplasty and needs reevaluation by her cardiologist.  3.  She is status post NSVD  x3 and SAB x3.   MEDICATIONS:  Glipizide, Lipitor, hydrochlorothiazide, Synthroid,  metoprolol, Prevacid, potassium, insulin, aspirin and Neurontin.   ALLERGIES:  Intolerant of VICODIN and GERD with ASPIRIN.   FAMILY HISTORY:  Significant for sister with gastric cancer and  postoperative blood clots. No known breast or ovarian malignancies.   REVIEW OF SYSTEMS:  The patient stated that she had been hospitalized with a  CVA or TIA at [facility] in May; records were obtained and her  final diagnosis was more compatible with symptoms related to degenerative  disk disease versus flare of migraine. She has had no further problems with  memory lapses. 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 Bowel and bladder functions are essentially normal.  Comprehensive review of systems otherwise negative 10/10.   EXAM:  Weight 217 pounds, blood pressure 115/70, temperature afebrile, pulse  60. The patient is alert and oriented x3, anxious but in no acute  distress.  ENT is benign with clear oropharynx, supple neck and slight thyroid  fullness. Lung fields are clear. Heart sounds are regular with no JVD. Back  and CVA regions are nontender. There is no pathologic lymphadenopathy. The  abdomen is soft and benign with no ascites, mass, organomegaly. Prior  laparoscopic subumbilical incision is well-healed without hernia.  Extremities are excellent strength and range of motion with no edema.  Pelvic, external genitalia and BUS are normal to inspection and palpation.  Bladder and urethra are normal. Vaginal mucosa is normal. Cervix is without  lesions and is mobile without tenderness. Bimanual and rectovaginal  examinations reveal retroflex uterus. There is an 8 cm mass in the right  adnexa, nontender and with no cul-de-sac nodularity.   ASSESSMENT:  Complex adnexal mass with ultrasound features worrisome but not  diagnostic of ovarian cancer, particularly  in light of normal CA-125.  Multiple comorbidities.   PLAN:  I recommended that the patient undergo total abdominal hysterectomy  with bilateral salpingo-oophorectomy. We discussed possibility of performing  this via laparoscopy at the [facility] and she is in favor of this. She is  well aware that if a malignancy were encountered, she would require an open  procedure and might require chemotherapy after convalescence. I believe that  we would be better able to manage her multiple comorbidities if surgery were  done in that setting.      John T. Kyla Balzarine, M.D.  Electronically Signed     JTS/MEDQ  D:  08/12/2005  T:  08/13/2005  Job:  [id number]   cc:   Lesly Dukes, M.D.   Georga Hacking, M.D.  Fax: [phone]  Email: [email or web address]   Wayne A. Sheffield Slider, M.D.  Fax: [phone]   Telford Nab, R.N.  [address]  [place], [place] [address]

## 2011-02-13 NOTE — Group Therapy Note (Signed)
NAME:  Kristine Boyer, Kristine Boyer NO.:  [id number]   MEDICAL RECORD NO.:  [id number]          PATIENT TYPE:  WOC   LOCATION:  [facility]                   FACILITY:  [facility]   PHYSICIAN:  Elsie Lincoln, MD      DATE OF BIRTH:  Mar 17, 1941   DATE OF SERVICE:  08/04/2005                                    CLINIC NOTE   The patient is a 70 year old female with multiple medical problems that was  sent to me from Penni Bombard, M.D. at family practice.  She is a G5, para 3-  0-2-3.  Has been menopausal for in her 48s for approximately 23 years.  She  was sent to me for right lower quadrant pain.  She had an ultrasound done on  October 4 which showed a complex cystic lesion in the right adnexa 8.8 x 6.2  x 6.1 with echogenic nodular component along the periphery.  Left ovary is  not well seen.  There is a tiny amount of free fluid in the cul-de-sac.  The  patient states that pain comes and goes and it is 8-9/10 on occasion.  Today, however, she seems to be quite at rest with no sharp pain.  The  patient also complains of increasing abdominal girth and bloating.  She is  able to eat so there is no early satiety.  The patient denies vaginal  bleeding and occasionally she does have dyspareunia when she is sexually  active.  She has sex approximately once every three to four months with one  partner.   PAST MEDICAL HISTORY:  She had a stroke in May 2006.  She has had insulin  resistance since 2002 with overt diabetes diagnosed in 2004 with a sugar  near 600.  She has positive angina and has had a history of a cardiac  catheterization.  She has a history of disk problems and needs neurosurgery.  They are waiting for hemoglobin A1c to become more controlled and will also  need a cardiac catheterization per her cardiologist before undergoing any  anesthesia.  She also has osteoarthritis, congestive heart failure, and  hypothyroidism.   PAST SURGICAL HISTORY:  A D&C and then it also sounds  like she had cervical  stenosis and scarring and had to be dilated but her remembrance of this is  sparse.   PAST GYNECOLOGICAL HISTORY:  NSVD x3 and SAB x2.  No other history of  fibroids or cysts, sexually transmitted diseases, or abnormal Pap smears.  Her last Pap smear was July 30, 2005.  Also, she has had a mammogram in  2006 and colonoscopy in approximately 2007.  She reports she thought it was  normal.   FAMILY HISTORY:  Aunt with cervical cancer and niece with breast cancer and  her grandfather and possibly mother had colon cancer.   MEDICATIONS:  __________, Lipitor, Synthroid, Prevacid, potassium, insulin,  aspirin, and Neurontin.   ALLERGIES:  Does not like VICODIN or ASPIRIN.   REVIEW OF SYMPTOMS:  Positive for the right lower quadrant pain as described  above.   PHYSICAL EXAMINATION:  VITAL SIGNS:  Temperature 97.3,  pulse 55, blood  pressure 111/69, weight 215.7, height 5 feet 2-1/2 inches.  GENERAL:  Well-developed, obese, no apparent distress.  ABDOMEN:  Soft, diffusely tender in both lower quadrants.  No fluid wave.  No evidence of gross ascites.  GENITALIA:  Tanner V.  Vagina atrophic, no lesions.  Cervix closed,  nontender.  No lesions.  Uterus difficult to palpate, however, feels small.  You can feel a large mass on the right side.  Left adnexa no masses,  nontender.  Rectovaginal:  No nodularity or masses.   ASSESSMENT/PLAN:  A 70 year old female with complex right cystic mass.  CA-  125 was 7.8 and CEA was 1.3.  Patient needs an oncology consult given the  appearance of the ovary.  Will call Telford Nab and see if we can set up  an appointment.  In the meantime, will repeat transvaginal ultrasound to see  if the mass has grown.  The patient is understanding that she will have  surgery so we need to prepare her for this and also have cardiac and medical  clearance.  Patient is to return in one week after the ultrasound and  hopefully we will talk to  Telford Nab by then.           ______________________________  Elsie Lincoln, MD     KL/MEDQ  D:  08/04/2005  T:  08/05/2005  Job:  [id number]

## 2011-02-13 NOTE — Group Therapy Note (Signed)
NAME:  Kristine Boyer, Kristine Boyer NO.:  [id number]   MEDICAL RECORD NO.:  [id number]          PATIENT TYPE:  WOC   LOCATION:  [facility]                   FACILITY:  [facility]   PHYSICIAN:  Elsie Lincoln, MD      DATE OF BIRTH:  Feb 08, 1941   DATE OF SERVICE:  08/11/2005                                    CLINIC NOTE   This is a 70 year old female who presents status post a follow-up  ultrasound.  The patient still has a right ovarian lesion that is very  worrisome for malignancy.  The patient has a follow-up appointment with Dr.  Kyla Balzarine tomorrow August 12, 2005 at 3:30.  The patient verbalizes that she  will make the appointment.  We will schedule the surgery as needed either at  [facility] with me and Dr. Kyla Balzarine or given the patient's comorbidities I  would suggest scheduling in [facility] where she has better access to  specialists.           ______________________________  Elsie Lincoln, MD     KL/MEDQ  D:  08/11/2005  T:  08/12/2005  Job:  [id number]

## 2011-02-13 NOTE — Consult Note (Signed)
NAME:  Kristine Boyer, Kristine Boyer NO.:  [id number]   MEDICAL RECORD NO.:  [id number]          PATIENT TYPE:  INP   LOCATION:  4709                         FACILITY:  [facility]   PHYSICIAN:  W. Ashley Royalty., M.D.DATE OF BIRTH:  May 16, 1941   DATE OF CONSULTATION:  02/06/2005  DATE OF DISCHARGE:                                   CONSULTATION   Thank you for asking me to see this 70 year old female for evaluation of  abnormal troponins and atypical symptoms.  The patient has a previous  history of having had a borderline abnormal Cardiolite in 2002 but failed to  return to follow up after that.  She had a cardiac catheterization done in  July 2004 with findings of a 50-60% mid LAD stenosis that was not felt to be  significant enough for therapy.  She was treated medically but quit taking  all her medications when she lost her insurance.  She was last seen in  October at which point in time, she was not having much in the way of chest  pain but had multiple symptoms related to her noncompliance with her other  medications.  She had not felt well for three months and had had some weight  loss.  She had also had some possible sinusitis and had felt poorly.  Yesterday, she had a feeling of feeling disconnected, had the sudden onset  of left arm shaking and a cramping pain from her shoulder down, she had some  shortness of breath and questionable diaphoresis.  She had some numbness  involving her lips and twitching of her tongue and her face as well as some  numbness in her leg and arm.  She then had some sharp chest pain and was  transported here by EMS.  Initial point of care enzymes were negative and an  EKG was unremarkable.  A CT scan was, evidently, unremarkable last night,  also.  She was found to have an abnormal glucose of greater than 500 and  treatment was instituted for this.  The symptoms appeared to resolve.  There  was a possible decrease in her vision.  Since then,  she has had an MRI and  has complained of a severe headache and difficulty moving her neck.  She  also has complained of occasional sharp chest pain lasting less than a few  seconds.  She had negative CPK MBs but had some slightly elevated troponin  at 0.12, a total three sets, but one set was elevated when the point of care  enzymes were negative.   PAST MEDICAL HISTORY:  Her past history is remarkable for hypothyroidism,  obesity, reflux esophagitis, non insulin dependent diabetes, hypertension,  and hyperlipidemia.   PAST SURGICAL HISTORY:  Breast biopsy, carpal tunnel release,  hemorrhoidectomy, and tonsillectomy.   ALLERGIES:  Aspirin causes nausea, Vicodin causes nausea.   FAMILY HISTORY:  Father died at age 16 of a stroke, mother died at age 13 of  heart failure.  Brother is adopted.  Sister age 103 is alive and well with  diabetes, history of heart failure,  hypertension, and cancer.   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  She denies substance  abuse.  She has occasional alcohol.  She used to smoke but quit in 1988.  She is currently divorced.   REVIEW OF SYMPTOMS:  She has complained of headaches since admission.  She  has had rhinorrhea and some sinusitis.  She has had atypical chest pain and  no significant edema.  She has had some lower abdominal pain and  questionable bright red blood with bowel movement on tissue.  She has had a  35 pound intentional weight loss over the past three months.  She has had  polyuria and polydipsia.  She has had possible abdominal surgery and a cyst  on her urethra previously.  Other than as noted above, the remainder of the  review of systems is unremarkable.   PHYSICAL EXAMINATION:  GENERAL:  She is an elderly female who appears slightly dysarthric, she is a  poor historian and has difficulty answering questions.  VITAL SIGNS:  Blood pressure  currently 150/80, pulse 76.  SKIN:  Warm and dry.  HEENT:  EOMI, PERRLA. Fundi not examined.  Pharynx negative.  NECK:  Supple without masses, somewhat difficulty moving neck, no carotid  bruits.  LUNGS:  Clear.  CARDIOVASCULAR:  Normal S1 and S2, no S3 or murmur.  ABDOMEN:  Grossly normal.  EXTREMITIES:  Distal pulses are 2+.   LABORATORY DATA:  Her EKG is normal.  Point of care enzymes were normal.  There were three troponins all at 0.9 to 0.12 and 0.13.  All the CPK MBs  were negative.   IMPRESSION:  1.  Abnormal troponin value in a clinical setting not consistent with      coronary artery disease.  2.  Symptoms suggestive of neurologic origin, either cervical disc disease      or possible primary neurologic event.  3.  Hyperglycemia.  4.  Hypertension with medical noncompliance.  5.  History of reflux and peptic ulcer disease.  6.  History of rectal bleeding.  7.  History of abdominal pain.   RECOMMENDATIONS:  At this point, it is difficult to explain the mildly  abnormal troponins, particularly in this clinical setting.  I do not think  this is ischemic symptoms even an atypical presentation of them.  Her EKG is  completely normal.  I would recommend a period of watchful waiting and  continued treatment of the hyperglycemia and hypertension.  I would check  another set of troponin and CPK in the morning as well as repeat EKG.  I  will check back with her again on Monday.  Would recommend repeating her  cardiac tests and enzymes over the weekend.   Thank you for asking me to see her with you.       WST/MEDQ  D:  02/06/2005  T:  02/06/2005  Job:  [id number]   cc:   [facility] Teaching Service

## 2011-02-13 NOTE — Consult Note (Signed)
NAME:  Kristine Boyer, Kristine Boyer NO.:  [id number]   MEDICAL RECORD NO.:  [id number]          PATIENT TYPE:  OUT   LOCATION:  GYN                          FACILITY:  [facility]   PHYSICIAN:  John T. Kyla Balzarine, M.D.    DATE OF BIRTH:  30-Nov-1940   DATE OF CONSULTATION:  10/20/2005  DATE OF DISCHARGE:                                   CONSULTATION   CHIEF COMPLAINT:  Postoperative follow-up after total laparoscopic  hysterectomy for complex adnexal mass.   HISTORY OF PRESENT ILLNESS:  The patient underwent total laparoscopic  hysterectomy at [facility] in December. She was found to have a benign serous  lesion with slight atypia that did not meet criteria for low malignant  potential lesion. The patient had an uncomplicated convalescence and is  approaching full activity. She notes some pulling discomfort at the site of  her trocars and minimal malodorous vaginal discharge. Bowel function has  essentially normalized unless she violates her lactose-free diet.   PHYSICAL EXAMINATION:  VITAL SIGNS:  Weight 216 pounds, vital signs stable.  BACK:  There is no back or CVA tenderness.  ABDOMEN:  The abdomen is soft and benign with well-healed trocar sites  without inflammation or hernia. There is no residual tenderness.  PELVIC:  External genitalia and BUS are normal. Speculum examination reveals  residual suture at the cuff. Bimanual and rectovaginal examinations reveal  minimal postop induration in the cuff with no tenderness.   ASSESSMENT:  Benign adnexal mass post total laparoscopic hysterectomy and  BSO, convalescing.   PLAN:  The patient is released from our clinic but we would be glad to see  her back at anytime on a p.r.n. basis. Further gynecologic follow-up could  be with her private gynecologist.      Jonny Ruiz T. Kyla Balzarine, M.D.  Electronically Signed     JTS/MEDQ  D:  10/20/2005  T:  10/21/2005  Job:  [id number]   cc:   Lesly Dukes, M.D.   Georga Hacking, M.D.  Fax:  [phone]  Email: [email or web address]   Wayne A. Sheffield Slider, M.D.  Fax: [phone]   Penni Bombard, MD  Fax: [phone]   Telford Nab, R.N.  [phone] [address]  [place], [place] [address]

## 2011-02-13 NOTE — H&P (Signed)
NAME:  Kristine Boyer, ARAMBURO NO.:  [id number]   MEDICAL RECORD NO.:  [id number]          PATIENT TYPE:  INP   LOCATION:  1843                         FACILITY:  [facility]   PHYSICIAN:  Ursula Beath, MD  DATE OF BIRTH:  August 20, 1941   DATE OF ADMISSION:  02/05/2005  DATE OF DISCHARGE:                                HISTORY & PHYSICAL   CHIEF COMPLAINT:  Elevated blood sugar and questionable TIA.   HISTORY OF PRESENT ILLNESS:  The patient is a 70 year old female patient  with a past medical history outlined below who presented to the [facility]  emergency department today after a one-day history of feeling odd and  disconnected. She had a sudden onset of left arm shaking which was  accompanied by a cramping-type pain from the shoulder down to the wrist. She  also had some shortness of breath with this and questionable diaphoresis.  She is unsure of the time course and is a poor historian for this event. She  is brought to the emergency department  via EMS and the pain had passed. At  that time she also had some left arm weakness and was dysarthric. She has  never had any event like this in the past. She did have some incontinence of  urine during the episode, but her symptoms are probably less than one to two  hours. She remembers all events. She has a questionable decrease in vision  on her left side.   REVIEW OF SYSTEMS:  Denies syncope.  Denies headache. Does complain of sore  throat and rhinorrhea which is secondary to sinusitis.  She has questionable  chest pain. She cannot characterize though. She has no edema. She has  complaint of some right flank pain and lower abdominal soreness. No nausea,  vomiting, diarrhea, or constipation. This morning she did have some bright  red blood with the bowel movement that was on the tissue after she wiped,  but no melena. She did some mild dysuria after a yeast infection, but that  has improved after a treatment with Monistat.  She has complained of some  polyuria and polydipsia. She has also had an intended 35-pound weight loss  in the last three months. She also notes that she may have a gait  disturbance.   PAST MEDICAL HISTORY:  Significant for diabetes mellitus type 2,  hypertension, history of angina, gastroesophageal reflux disease with a  hiatal hernia, history of gastric ulcers, history of hearing loss in the  right ear (she is unsure why), and a history of urge incontinence.   PAST SURGICAL HISTORY:  She had a cardiac catheterization in July 2004 by  Dr. Donnie Aho that showed moderate disease of the LAD with 50% to 60% stenosis  and an ejection fraction of 60% to 65%. She had a carpal tunnel release done  in 1976, hemorrhoidectomy in 1968, a tonsillectomy at age 71, and abdominal  surgery. She is unsure as to why this was done, but apparently a laparotomy  was performed. She also had a cyst on her urethra that was removed.   MEDICATIONS:  1.  Levoxyl 0.1 mg p.o. daily.  2.  Toprol 50 mg p.o. daily, which she was not taking.  3.  Aspirin one a day.  4.  Lipitor 40 mg one p.o. daily.   ALLERGIES:  VICODIN, but does not clear reaction to that.   FAMILY HISTORY:  Mother died at age 70. She had renal cell carcinoma,  hypertension, diabetes mellitus type 2, and congestive heart failure. Her  father died, unsure of the age, and at his death he had PVA and diabetes  mellitus type 2. She has one sister who has colon cancer, now with  peritoneal mets, diabetes mellitus type 2, and TIAs. She has one brother who  is adopted. She has three children. Her daughter has diabetes and  hypertension.   SOCIAL HISTORY:  She has not been taking her blood pressure or diabetes  medications secondary to her finances. She lives on retirement benefits, but  is not yet eligible Medicare. She lives with her 24 year old granddaughter.  She denies tobacco, but has a remote history. She does admit to occasional  alcohol use.  About one to two times a year she will have one or alcoholic  beverages and she denies drug use.   PHYSICAL EXAMINATION:  VITAL SIGNS: Temperature 98, pulse from 76 to 93,  blood pressure 167/93, respirations 14, 100% on room air.  GENERAL: She is in no acute distress, alert and oriented times four.  HEENT: Pupils equal, round, and reactive to light. Extraocular movements are  intact. Oropharynx is without erythema or exudate. She has no  lymphadenopathy. She is edentulous with the exception of six lower front  teeth and her fundi appear to be within normal limits bilaterally.  CARDIOVASCULAR: Regular rate and rhythm. No murmur is noted. She has 2+  distal pulses. No JVD. No carotid bruits are noted.  PULMONARY: Clear to auscultation bilaterally with good inspiratory effort.  ABDOMEN: Soft, diffusely tender to palpation with no rebound or guarding.  She has normoactive bowel sounds.  RECTAL EXAM: Normal sphincter tone. There is no stool in the vault. She is  heme negative.  EXTREMITIES: No edema.  NEUROLOGIC: Cranial nerves II-XII are intact with the exception of a left  visual field defect. Her strength is 5/5 in bilateral upper and lower  extremities. Her grip strength is equal bilaterally. She has no facial  droop. Her gait is somewhat slow, but she is attached to multiple monitors  and has a Foley in place which is bothering her. She has 2+ DTRs in her  bilateral upper and lower extremities which are symmetric. She has no arm  drift and a negative Romberg.   CBC shows a white count of 7.3, H&H12.9 and 37.8, platelet count 181,000.  She has 71% neutrophils. Urinalysis reveals specific gravity of 1.024, pH  7.0, glucose greater than 1000, negative for hemoglobin, negative bilirubin,  negative ketones, negative protein, 0.2 urobilinogen, negative nitrites,  negative leukocyte esterase, 0-2 white blood cells, 0-2 red blood cells, rare bacteria, calcium oxalate crystals. An I-STAT8  shows sodium of 137,  potassium 3.5, chloride 102, bicarbonate 29.4, BUN 9, creatinine 0.8,  glucose 531, pH of 7.417, PCO2 45.6.   ECG shows a normal sinus rhythm with 75 beats per minute and a first-degree  AV block. CT of the head showed no acute disease and chronic white matter  disease.   ASSESSMENT/PLAN:  A 70 year old female with:  1.  Left arm weakness and dysarthria. Her symptoms resolved in less than  four hours, so this seems likely to be a transient ischemic attack,      possibly a clumsy hand dysarthria type of lesion. Other conditions in      the differential include seizure. The patient did lose control of her      bladder, but she does note that she has urinary urge incontinence at      baseline and she was not postictal as described by herself or EMS. I      believe that the shaking type episode could be secondary to her      hyperglycemia as well. CBG was greater than 500 in the ambulance.   I am concerned that she does seem to have a questionable left visual field  defect. I will re-evaluate this in the morning when she is not distracted by  the multiple family members that were in the ED with her.   Most acute symptoms seem to have resolved. I will admit her to the family  teaching service, telemetry bed, to rule out an arrhythmia. Of note her EKG  did not show any arrhythmias including no evidence of atrial fibrillation. I  will check cardiac enzymes given her left arm pain and an ECG in the  morning. I will also obtain a BMET and a thyroid stimulating hormone.  I  will discuss with the inpatient team the need for an inpatient workup such  as carotid Dopplers. We will consider speech evaluation, physical therapy,  and occupational therapy. I will control her blood pressure  with her home  medications of Toprol as this appears to be a TIA, and also start Aggrenox  because she was on aspirin when this event occurred, so I will consider an  aspirin failure.  1.   Diabetes mellitus, type 2.  The patient has not been compliant with her      medication, so I suspect that this is secondary to that and not an acute      infection or other process. I will check cardiac enzymes, however her      urinalysis showed no signs of infection and her lungs are clear.  While      in the acute setting, I will use sliding scale insulin and try to find      an inexpensive oral hypoglycemic for this patient.  I will also maintain      on her a diabetic diet and check CBGs q.a.c. and q.h.s. I will      discontinued her Foley catheter as soon as that is a possibility.  2.  Hypertension. Given that this seems to be a transient ischemic attack      and not a cerebrovascular accident, I will control her blood pressure      with Toprol 50 mg b.i.d. I will consider adding hydrochlorothiazide and      an ACE inhibitor when she is more stable.  3.  Hypercholesterolemia. I will continue her Lipitor, check a fasting lipid     profile in the morning.  4.  Hypothyroidism. Will check a TSH and continue her Levoxyl.  5.  Gastroesophageal reflux disease and history of  peptic ulcer disease. I      will give her Protonix 40 mg p.o. daily.  6.  Deep venous thrombosis prophylaxis. I will give her SCDs while she is in      bed.  7.  Rectal bleeding. Her hemoccult was negative, however there is no stool      in  the rectal vault. I will check a CBC. I suspect this is secondary to      hemorrhoids, but I will monitor. Please see problem #9 for further      assessment.  8.  Abdominal pain and weight loss. The patient and her family are quite      concerned because her sister had colon cancer and currently has      metastasis to her peritoneum. Her sister does state it was just like      Saretta's symptoms. However, these symptoms could also be secondary to      her general ill health and the fact that she was attempting weight loss.      I will discuss with my attending any further workup  that may need to be      done. I may consider a CA-125 or at least an outpatient colonoscopy for      screening.  9.  Disposition. I hope for a brief stay in the hospital the patient      desperately needs to follow up with her primary MD. She has been seen in      the past at [facility] by Dr. Leanord Hawking. However, she has been unable to      afford visits to the doctor or her medications. I have asked care      management and social work to consult regarding her financial issues.      Perhaps she can come to the [facility] and speak      to [facility] regarding her financial situation.      JT/MEDQ  D:  02/05/2005  T:  02/05/2005  Job:  [id number]

## 2011-02-17 ENCOUNTER — Encounter: Payer: Self-pay | Admitting: Family Medicine

## 2011-02-17 ENCOUNTER — Ambulatory Visit (INDEPENDENT_AMBULATORY_CARE_PROVIDER_SITE_OTHER): Payer: Medicare HMO | Admitting: Family Medicine

## 2011-02-17 VITALS — BP 159/94 | HR 73 | Temp 97.2°F | Ht 63.0 in | Wt 208.0 lb

## 2011-02-17 DIAGNOSIS — M654 Radial styloid tenosynovitis [de Quervain]: Secondary | ICD-10-CM

## 2011-02-17 NOTE — Patient Instructions (Signed)
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  WE injected it today.  If should start feeling better in 48 hours.  Use the splint for 1 week.

## 2011-02-23 DIAGNOSIS — M654 Radial styloid tenosynovitis [de Quervain]: Secondary | ICD-10-CM | POA: Insufficient documentation

## 2011-02-23 HISTORY — DX: Radial styloid tenosynovitis (de quervain): M65.4

## 2011-02-23 NOTE — Progress Notes (Signed)
Left wrist pain: Pt has had some left wrist pain for the last 2 weeks. 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  Pt has been using Tramadol but it is not working. She would like to have an injection today if it would help her wrist.   ROS: neg except as noted in HPI  PE: MSK: Left wrist pain with Finkelstein's maneuver, no pain in Rt wrist, no swelling, no erythema, no crepitus.

## 2011-02-23 NOTE — Assessment & Plan Note (Addendum)
Pt has had some left wrist pain for the last 2 weeks. 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  Pt has been using Tramadol is not working. She would like to have an injection today.   Injection done: Consent signed. Left wrist. 1/2 cc Kenalog 40 and 1/2 cc Lidocaine w/o epi.  Skin prepped with alcohol and cooled with spray prior to injection.   Pt given Rx for universal thumb splint. She is to wear it for 1 week and then her wrist should be better.

## 2011-03-09 ENCOUNTER — Inpatient Hospital Stay (HOSPITAL_COMMUNITY)
Admission: EM | Admit: 2011-03-09 | Discharge: 2011-03-11 | DRG: 149 | Disposition: A | Discharge status: Home / Self Care | Payer: Medicare HMO | Attending: Family Medicine | Admitting: Family Medicine

## 2011-03-09 ENCOUNTER — Emergency Department (HOSPITAL_COMMUNITY): Payer: Medicare HMO

## 2011-03-09 ENCOUNTER — Encounter: Payer: Self-pay | Admitting: Family Medicine

## 2011-03-09 DIAGNOSIS — Z7982 Long term (current) use of aspirin: Secondary | ICD-10-CM

## 2011-03-09 DIAGNOSIS — D72829 Elevated white blood cell count, unspecified: Secondary | ICD-10-CM | POA: Diagnosis present

## 2011-03-09 DIAGNOSIS — E039 Hypothyroidism, unspecified: Secondary | ICD-10-CM | POA: Diagnosis present

## 2011-03-09 DIAGNOSIS — Z79899 Other long term (current) drug therapy: Secondary | ICD-10-CM

## 2011-03-09 DIAGNOSIS — K219 Gastro-esophageal reflux disease without esophagitis: Secondary | ICD-10-CM | POA: Diagnosis present

## 2011-03-09 DIAGNOSIS — Z794 Long term (current) use of insulin: Secondary | ICD-10-CM

## 2011-03-09 DIAGNOSIS — R0789 Other chest pain: Secondary | ICD-10-CM | POA: Diagnosis present

## 2011-03-09 DIAGNOSIS — H8110 Benign paroxysmal vertigo, unspecified ear: Secondary | ICD-10-CM

## 2011-03-09 DIAGNOSIS — G8929 Other chronic pain: Secondary | ICD-10-CM | POA: Diagnosis present

## 2011-03-09 DIAGNOSIS — I10 Essential (primary) hypertension: Secondary | ICD-10-CM | POA: Diagnosis present

## 2011-03-09 DIAGNOSIS — I5022 Chronic systolic (congestive) heart failure: Secondary | ICD-10-CM | POA: Diagnosis present

## 2011-03-09 DIAGNOSIS — I2510 Atherosclerotic heart disease of native coronary artery without angina pectoris: Secondary | ICD-10-CM | POA: Diagnosis present

## 2011-03-09 DIAGNOSIS — M1990 Unspecified osteoarthritis, unspecified site: Secondary | ICD-10-CM | POA: Diagnosis present

## 2011-03-09 DIAGNOSIS — E785 Hyperlipidemia, unspecified: Secondary | ICD-10-CM | POA: Diagnosis present

## 2011-03-09 DIAGNOSIS — J309 Allergic rhinitis, unspecified: Secondary | ICD-10-CM | POA: Diagnosis present

## 2011-03-09 DIAGNOSIS — E876 Hypokalemia: Secondary | ICD-10-CM | POA: Diagnosis present

## 2011-03-09 DIAGNOSIS — R42 Dizziness and giddiness: Principal | ICD-10-CM | POA: Diagnosis present

## 2011-03-09 DIAGNOSIS — E669 Obesity, unspecified: Secondary | ICD-10-CM | POA: Diagnosis present

## 2011-03-09 DIAGNOSIS — E119 Type 2 diabetes mellitus without complications: Secondary | ICD-10-CM | POA: Diagnosis present

## 2011-03-09 LAB — BASIC METABOLIC PANEL
Chloride: 101 mEq/L (ref 96–112)
GFR calc Af Amer: >60 mL/min (ref >60)
GFR calc non Af Amer: >60 mL/min (ref >60)
Glucose, Bld: 127 mg/dL — ABNORMAL HIGH (ref 70–99)
Potassium: 2.9 mEq/L — ABNORMAL LOW (ref 3.5–5.1)
Sodium: 142 mEq/L (ref 135–145)

## 2011-03-09 LAB — DIFFERENTIAL
Eosinophils Absolute: 0.1 10*3/uL (ref 0.0–0.7)
Eosinophils Relative: 1 % (ref 0–5)
Lymphocytes Relative: 38 % (ref 12–46)
Lymphs Abs: 4.3 10*3/uL — ABNORMAL HIGH (ref 0.7–4.0)
Monocytes Absolute: 0.8 10*3/uL (ref 0.1–1.0)
Monocytes Relative: 7 % (ref 3–12)

## 2011-03-09 LAB — CBC
HCT: 36.4 % (ref 36.0–46.0)
MCH: 27.2 pg (ref 26.0–34.0)
MCHC: 34.6 g/dL (ref 30.0–36.0)
MCV: 78.6 fL (ref 78.0–100.0)
Platelets: 168 10*3/uL (ref 150–400)
RDW: 13.5 % (ref 11.5–15.5)

## 2011-03-09 LAB — CK TOTAL AND CKMB (NOT AT ARMC): CK, MB: 3.2 ng/mL (ref 0.3–4.0)

## 2011-03-09 LAB — URINALYSIS, ROUTINE W REFLEX MICROSCOPIC
Bilirubin Urine: NEGATIVE
Hgb urine dipstick: NEGATIVE
Ketones, ur: NEGATIVE mg/dL
Nitrite: NEGATIVE
Specific Gravity, Urine: 1.01 (ref 1.005–1.030)
Urobilinogen, UA: 1 mg/dL (ref 0.0–1.0)

## 2011-03-09 NOTE — Progress Notes (Signed)
Family Medicine Teaching [facility] Admission History and Physical  Patient name: Kristine Boyer Medical record number: [id number] Date of birth: Mar 12, 1941 Age: 70 y.o. Gender: female  Primary Care Provider: Jamie Brookes, MD  Chief Complaint: chest pain, dizziness History of Present Illness: Kristine Boyer is a 70 y.o. year old female w/ PMH of DM, HLD, HTN, CAD presenting with dizziness and chest pain. Pt states that she has had intermittent, mild, substernal chest pain that is worsened with exertion and relieved by rest.  It is most often brought on by working and playing with her grandchildren.  She endorses SOB and diaphoresis during these CP episodes.  Most recent episode was ~ 1 week ago.  In addition, pt also complaining of dizziness, which is her main concern.  Pt has known diagnosis of vertigo, feels like these episodes are similar to episodes in the past.  Yesterday, she became dizzy when going from laying to sitting too quickly.  This resolved spontaneously, but again occurred this morning around 11, again when going from laying to sitting.  She also experienced nausea and some left jaw pain, and became concerned that the dizziness may be 2/2 to her heart.  No CP, palpitations, diaphoresis, SOB, numbness or tingling at the time of the dizziness. Also of note, pt restarted taking her medications ~3 days ago (she had lost them, but found them and restarted them).   Patient Active Problem List  Diagnoses  . HYPOTHYROIDISM, UNSPECIFIED  . DIABETES MELLITUS II, UNCOMPLICATED  . HYPERLIPIDEMIA  . OBESITY  . HYPERTENSION, BENIGN SYSTEMIC  . CHRONIC SYSTOLIC HEART FAILURE  . ALLERGIC RHINITIS  . TOOTH LOSS  . GASTROESOPHAGEAL REFLUX, NO ESOPHAGITIS  . OSTEOARTHRITIS, MULTI SITES  . BACK PAIN, LUMBAR  . SACROILIAC JOINT DYSFUNCTION  . Foot pain, right  . De Quervain's syndrome (tenosynovitis)   Past Medical History: No past medical history on file.  Past Surgical History: Past  Surgical History  Procedure Date  . Abdominal hysterectomy   . Tonsilectomy, adenoidectomy, bilateral myringotomy and tubes     Social History: History   Social History  . Marital Status: Single    Spouse Name: N/A    Number of Children: 3  . Years of Education: N/A   Occupational History  . retiredCabin crew    Social History Main Topics  . Smoking status: Former Smoker    Quit date: 02/09/1986  . Smokeless tobacco: Never Used  . Alcohol Use: 0.5 oz/week    1 drink(s) per week  . Drug Use: No  . Sexually Active: None   Other Topics Concern  . None   Social History Narrative   Health Care POA: Emergency Contact: Daughter, Dorothyann Peng [phone] of Life Plan: Who lives with you: Lives with daughter, granddaughter,  great grand daughter and house mate. Any pets: noneDiet: Patient currently does not follow a diabetic diet plan.  She reports eating sugary foods often.Exercise: Patient does not have a current exercise plan.Seatbelts: Patient reports wearing her seatbelt when she is in vehicle.Wynelle Link Exposure/Protection: Hobbies: Dancing, watching tv, swimming, singing    Family History: Family History  Problem Relation Age of Onset  . Diabetes Mother     Allergies: Allergies  Allergen Reactions  . Vicodin (Hydrocodone-Acetaminophen) Itching    Current Outpatient Prescriptions  Medication Sig Dispense Refill  . aspirin (BAYER ASPIRIN) 325 MG tablet Take 325 mg by mouth daily.        . calcium carbonate (OS-CAL) 600 MG TABS Take 600 mg  by mouth 2 (two) times daily with a meal.        . glipiZIDE (GLUCOTROL) 10 MG 24 hr tablet Take 10 mg by mouth daily.       . insulin glargine (LANTUS) 100 UNIT/ML injection Inject 60 Units into the skin daily.        [name] levothyroxine (SYNTHROID, LEVOTHROID) 175 MCG tablet Take 175 mcg by mouth daily.        . metoprolol (TOPROL-XL) 50 MG 24 hr tablet Take 1 tablet (50 mg total) by mouth daily.  31 tablet  3  . Multiple Vitamin  (MULTIVITAMIN) tablet Take 1 tablet by mouth daily.        [name] oxyCODONE-acetaminophen (ROXICET) 5-325 MG per tablet Take 1 tablet by mouth every 6 (six) hours as needed for Pain.  35 tablet  0  . pantoprazole (PROTONIX) 40 MG tablet Take 1 tablet (40 mg total) by mouth daily. For reflux; has failed trial of otc prilosec for 3 months and prescription omeprazole for 3 months  30 tablet  3  . simvastatin (ZOCOR) 40 MG tablet Take 40 mg by mouth daily.        . traMADol (ULTRAM) 50 MG tablet Take 50 mg by mouth every 6 (six) hours as needed. For back pain       . valsartan-hydrochlorothiazide (DIOVAN-HCT) 160-25 MG per tablet Take 1 tablet by mouth daily.         Review Of Systems: Per HPI. Physical Exam: Pulse: 66 Blood Pressure: 166/94 RR: 18       O2: 98% on RA Temp: 98.6  General: alert, cooperative, no distress, mildly obese and moderately obese HEENT: PERRLA, extra ocular movement intact, sclera clear, anicteric, oropharynx clear, no lesions, neck supple with midline trachea and MM mildly tacky, lips slightly dry Heart: S1, S2 normal, no murmur, rub or gallop, regular rate and rhythm Lungs: clear to auscultation, no wheezes or rales and unlabored breathing Abdomen: abdomen is soft without significant tenderness, masses, organomegaly or guarding Extremities: extremities normal, atraumatic, no cyanosis or edema and no edema, redness or tenderness in the calves or thighs Skin:no rashes, no petechiae Neurology: normal without focal findings, mental status, speech normal, alert and oriented x3, PERLA, cranial nerves 2-12 intact, muscle tone and strength normal and symmetric and sensation grossly normal  Labs and Imaging: Lab Results  Component Value Date/Time   NA 142 03/09/2011  8:30 PM   K 2.9* 03/09/2011  8:30 PM   CL 101 03/09/2011  8:30 PM   CO2 31 03/09/2011  8:30 PM   BUN 12 03/09/2011  8:30 PM   CREATININE 0.84 03/09/2011  8:30 PM   CREATININE 1.08 11/11/2010 11:20 AM   GLUCOSE 127*  03/09/2011  8:30 PM   Lab Results  Component Value Date   WBC 11.4* 03/09/2011   HGB 12.6 03/09/2011   HCT 36.4 03/09/2011   MCV 78.6 03/09/2011   PLT 168 03/09/2011   UA: negative CE: trop <30, CKMB 3.2, CK total 279 (elevated)  CXR:  Mild cardiomegaly. No focal pulmonary abnormality.  Head CT: No evidence of acute intracranial hemorrhage, mass lesion, or acute infarct.   Assessment and Plan: DARRIAN GOODWILL is a 70 y.o. year old female presenting with dizziness and intermittent CP. 1. Chest Pain: typical CP in a pt with multiple risk factors. Will admit for CP r/o, next set of CE's at 0230 (3 sets at q6hr), most likely to be cardiac in nature. Will likely need a  stress test regardless of if CE's are negative or positive, as last cath was 2006 (normal).  Cont pt's ASA. Will check A1c, FLP, TSH. 2. Dizziness: Head CT negative, likely vertigo vs orthostatic hypotension.  Will check orthostatic vital signs and then rehydrate as pt appears slightly dry.  In addition, pt just restarted BP meds, so these could be contributing as well, especially since dizziness occurred w/ positional changes x2.  Will give small dose of Valium, 2.5mg , as this has helped her in the past with her vertigo.  Pt states she did not like meclizine and did not think that it helped.  Will continue to monitor. 3.  Hypokalemia: K 2.9, repleted in ED w/ KCl.  Will add K to fluids, recheck BMET in AM and continue giving KCl if needed. 4. DM: Will give lanuts 40U tonight (home dose is 60) and place on SSI.  Pt states, and grand-daughter confirms, that lantus is the one medication that she takes regularly and very rarely misses. 5.  HTN: BPs currently elevated, but will start home metoprolol tonight, and depending on pressures in AM, could potentially restart valsartan/HCTZ. 6.  HLD: Will restart home statin in the morning. 7.  Hypothyroidism: Will check TSH (last on in 07/2010 normal) and continue home synthroid dose. 8.    Leukocytosis:  Very mild, will recheck CBC in AM. UA appears negative and no focal signs or symptoms of infection, afebrile.  9. FEN/GI: heart healthy, carb modified diet; NS + KCl @ 100 (after orthostatics are done) 10. Prophylaxis: SQ heparin, protonix 11. Disposition: Pending chest pain r/o, clinical improvement; possibly tomorrow.

## 2011-03-10 LAB — BASIC METABOLIC PANEL
CO2: 30 mEq/L (ref 19–32)
Calcium: 8.9 mg/dL (ref 8.4–10.5)
Chloride: 102 mEq/L (ref 96–112)
Creatinine, Ser: 0.8 mg/dL (ref 0.4–1.2)
GFR calc Af Amer: >60 mL/min (ref >60)
GFR calc non Af Amer: >60 mL/min (ref >60)
Glucose, Bld: 232 mg/dL — ABNORMAL HIGH (ref 70–99)
Potassium: 4 mEq/L (ref 3.5–5.1)
Sodium: 143 mEq/L (ref 135–145)
Sodium: 138 mEq/L (ref 135–145)

## 2011-03-10 LAB — GLUCOSE, CAPILLARY

## 2011-03-10 LAB — CBC
Hemoglobin: 12.3 g/dL (ref 12.0–15.0)
MCV: 79.1 fL (ref 78.0–100.0)
Platelets: 154 10*3/uL (ref 150–400)
RBC: 4.54 MIL/uL (ref 3.87–5.11)
WBC: 10.6 10*3/uL — ABNORMAL HIGH (ref 4.0–10.5)

## 2011-03-10 LAB — URINE CULTURE
Colony Count: NO GROWTH
Culture  Setup Time: 201206112246

## 2011-03-10 LAB — CARDIAC PANEL(CRET KIN+CKTOT+MB+TROPI)
CK, MB: 2.4 ng/mL (ref 0.3–4.0)
Relative Index: 1.1 (ref 0.0–2.5)
Total CK: 235 U/L — ABNORMAL HIGH (ref 7–177)
Total CK: 190 U/L — ABNORMAL HIGH (ref 7–177)
Troponin I: <0.3 ng/mL (ref <0.30)
Troponin I: <0.3 ng/mL (ref <0.30)

## 2011-03-10 LAB — LIPID PANEL: Cholesterol: 149 mg/dL (ref 0–200)

## 2011-03-10 LAB — TSH: TSH: 3.535 u[IU]/mL (ref 0.350–4.500)

## 2011-03-11 LAB — BASIC METABOLIC PANEL
CO2: 27 mEq/L (ref 19–32)
Chloride: 107 mEq/L (ref 96–112)
Creatinine, Ser: 0.79 mg/dL (ref 0.4–1.2)

## 2011-03-11 LAB — MAGNESIUM: Magnesium: 2.4 mg/dL (ref 1.5–2.5)

## 2011-03-11 LAB — GLUCOSE, CAPILLARY: Glucose-Capillary: 86 mg/dL (ref 70–99)

## 2011-03-11 NOTE — H&P (Signed)
NAME:  Kristine Boyer, Kristine Boyer NO.:  [id number]  MEDICAL RECORD NO.:  [id number]  LOCATION:  [facility]                         FACILITY:  [facility]  PHYSICIAN:  Nestor Ramp, MD        DATE OF BIRTH:  Mar 27, 1941  DATE OF ADMISSION:  03/09/2011 DATE OF DISCHARGE:                             HISTORY & PHYSICAL   PRIMARY CARE PHYSICIAN:  Jamie Brookes, MD, [facility].  CHIEF COMPLAINT:  Dizziness, chest pain.  HISTORY OF PRESENT ILLNESS:  This is a 70 year old female with past medical history of diabetes, hyperlipidemia, hypertension, and CAD presenting with dizziness and chest pain.  The patient states that she has had intermittent mild substernal chest pain that has worsened with exertion and relieved by rest off and on for the past few weeks.  It is most often brought on by working and playing with her grandchildren. She endorses some shortness of breath and diaphoresis during this chest pain episodes, most recent episode was approximately 1 week ago.  In addition, the patient is also complaining of dizziness which is her main concern.  The patient has known diagnosis of vertigo and feels that these episodes are similar to episodes in the past.  Yesterday, she became acutely dizzy when going from lying to sitting too quickly.  This resolved spontaneously but again occurred this morning around 11:00, again when going from lying to sitting.  She also experienced some nausea and left jaw pain and became concerned as the dizziness may be due to her heart.  The patient had no chest pain, palpitations, diaphoresis, shortness of breath, numbness or tingling of the time of the dizziness.  Also of note, the patient restarted taking her home medications approximately 3 days ago including blood pressure medications.  PAST MEDICAL HISTORY: 1. Type 2 diabetes. 2. Hyperlipidemia. 3. Obesity. 4. Hypertension. 5. Chronic systolic heart failure. 6. Allergic rhinitis. 7.  GERD. 8. Osteoarthritis. 9. Back pain. 10.Hypothyroidism. 11.Vertigo.  PAST SURGICAL HISTORY: 1. Abdominal hysterectomy. 2. Tonsillectomy. 3. Adenoidectomy. 4. Bilateral myringotomy and tubes.  SOCIAL HISTORY:  The patient lives here in [place] with her children. She is retired.  She quit smoking in 1987, endorses sparse alcohol use less than once per week, and denies any other drug use.  ALLERGIES:  VICODIN causes itching.  CURRENT MEDICATIONS: 1. Aspirin 325 mg p.o. daily. 2. Os-Cal 600 mg p.o. b.i.d. with meals. 3. Glipizide 10 mg p.o. daily. 4. Lantus 60 units subcu daily. 5. Synthroid 175 mcg p.o. daily. 6. Toprol-XL 50 mg p.o. daily. 7. Multivitamin daily. 8. Oxycodone/acetaminophen 5/325 one tablet p.o. q.6 h. p.r.n. pain. 9. Protonix 40 mg p.o. daily. 10.Zocor 40 mg p.o. daily. 11.Ultram 50 mg p.o. q.6 h. p.r.n. back pain. 12.Valsartan/hydrochlorothiazide 160/25 one tablet p.o. daily.  PHYSICAL EXAMINATION:  VITAL SIGNS:  Pulse 66, blood pressure 166/94, respiratory rate 18, O2 98% on room air, and temperature 98.6.GENERAL:  Alert, cooperative, no distress, mildly obese. HEENT:  PERRLA.  Extraocular movements intact.  Sclerae clear, anicteric.  Oropharynx clear.  No lesions, erythema, or exudate.  Mucous membranes mildly tacky.  Lips slightly dry. NECK:  Supple without thyroid enlargement. CARDIOVASCULAR:  Regular  rate and rhythm.  No murmur. LUNGS:  Clear to auscultation bilaterally.  No wheezes, rales, or rhonchi.  No increased work of breathing. ABDOMEN:  Soft, nontender, and nondistended.  Positive bowel sounds. EXTREMITIES:  Warm and well perfused.  No edema, redness, or tenderness. SKIN:  No rashes. NEUROLOGIC:  Normal without focal findings.  Mental status:  Speech normal, alert, and oriented x3.  Cranial nerves II-XII grossly intact. Muscle tone and strength normal and symmetric.  Sensation grossly normal.  Of note, did not have the patient  stand.  LABORATORY DATA:  CBC:  11.4/12.6/36.4/168.  BMET: 142/2.9/101/31/12/0.84/127.  Cardiac enzymes:  Negative.  Urinalysis: Negative.  Chest x-ray:  Mild cardiomegaly.  No focal pulmonary abnormality.  Head CT:  No evidence of acute intracranial hemorrhage, mass, lesion, or acute infarct.  ASSESSMENT AND PLAN:  This is a 71 year old female presenting with dizziness and intermittent chest pain. 1. Chest pain.  Relatively typical chest pain in a patient with     multiple risk factors.  We will admit for chest pain rule out.     Next set of cardiac enzymes at 0230.  We will get 3 sets total     every 6 hours.  This is most likely to be cardiac in nature,     however, there may be a gastroesophageal reflux disease component.     The patient will likely need a stress test regardless of cardiac     enzymes are positive or negative.  However, it can be decided after     cardiac enzymes are cycled whether this needs to be an inpatient or     outpatient occurrence.  The patient's last catheterization in 2006     was normal.  We will continue the patient's aspirin.  We will also     check an A1c, FLP, and TSH as she is due for these. 2. Dizziness.  The patient with a negative head CT tonight in the ED.     This is likely vertigo versus orthostatic hypotension.  We will     check orthostatic vital signs and then rehydrate if the patient     appears slightly dry.  In addition, the patient just restarted her     home blood pressure medications approximately 3 days ago, so these     could be contributing as well, especially since dizziness occur     with positional changes x2.  We will give a small dose of Valium     2.5 mg as this has helped her in the past with her vertigo.  The     patient states that she did not like meclizine and did not think     that it helps, so we will not start this at this time.  We will     continue to monitor. 3. Hypokalemia.  Potassium 2.9 in the emergency  department.  The     patient was given 40 mEq of KCl.  We will add potassium to fluids     and recheck a BMET in the a.m.  If needed, we will continue     repleting KCl at that time. 4. Diabetes.  We will give Lantus 40 tonight, home dose of 60 and     place on sliding scale insulin.  The patient states and     granddaughter confirmed that Lantus is the one medication that she     takes regularly and very rarely misses. 5. Hypertension.  Blood pressure is currently  elevated in the 160s-     170s.  We will restart home metoprolol XR tonight and depending on     pressures in the morning, we could potentially restart     valsartan/hydrochlorothiazide. 6. Hyperlipidemia.  We will restart home statin in the morning. 7. Hypothyroidism.  We will check TSH, last one in 07/2010 normal and     continue home Synthroid dose. 8. Leukocytosis, very mild.  We will recheck a CBC in the morning.     Urinalysis appears negative and there are no focal signs or     symptoms of infection.  The patient has been afebrile. 9. Fluids, electrolytes, nutrition, and gastrointestinal.  We will     start a heart-healthy carb-modified diet.  We will also run normal     saline plus 20 mEq of KCl at 100 mL/hour.  Orthostatic vital signs     will be done prior to starting rehydration. 10.Prophylaxis.  Subcu heparin and Protonix. 11.Disposition.  Pending clinical improvement and chest pain, rule out     possibly later today.    ______________________________ Demetria Pore, MD   ______________________________ Nestor Ramp, MD    JM/MEDQ  D:  03/10/2011  T:  03/10/2011  Job:  [id number]  cc:   Jamie Brookes, MD  Electronically Signed by Demetria Pore MD on 03/11/2011 09:23:02 AM Electronically Signed by Denny Levy MD on 03/11/2011 12:21:34 PM

## 2011-03-12 ENCOUNTER — Ambulatory Visit: Payer: Medicare HMO | Attending: Family Medicine | Admitting: Physical Therapy

## 2011-03-12 DIAGNOSIS — R269 Unspecified abnormalities of gait and mobility: Secondary | ICD-10-CM | POA: Insufficient documentation

## 2011-03-12 DIAGNOSIS — H8110 Benign paroxysmal vertigo, unspecified ear: Secondary | ICD-10-CM | POA: Insufficient documentation

## 2011-03-12 DIAGNOSIS — IMO0001 Reserved for inherently not codable concepts without codable children: Secondary | ICD-10-CM | POA: Insufficient documentation

## 2011-03-13 ENCOUNTER — Encounter: Payer: Medicare HMO | Admitting: Physical Therapy

## 2011-03-17 ENCOUNTER — Ambulatory Visit: Payer: Medicare HMO | Admitting: Physical Therapy

## 2011-03-18 ENCOUNTER — Ambulatory Visit (INDEPENDENT_AMBULATORY_CARE_PROVIDER_SITE_OTHER): Payer: Medicare HMO | Admitting: Family Medicine

## 2011-03-18 ENCOUNTER — Encounter: Payer: Self-pay | Admitting: Family Medicine

## 2011-03-18 DIAGNOSIS — E119 Type 2 diabetes mellitus without complications: Secondary | ICD-10-CM

## 2011-03-18 DIAGNOSIS — E876 Hypokalemia: Secondary | ICD-10-CM | POA: Insufficient documentation

## 2011-03-18 DIAGNOSIS — I10 Essential (primary) hypertension: Secondary | ICD-10-CM

## 2011-03-18 DIAGNOSIS — E039 Hypothyroidism, unspecified: Secondary | ICD-10-CM

## 2011-03-18 DIAGNOSIS — M654 Radial styloid tenosynovitis [de Quervain]: Secondary | ICD-10-CM

## 2011-03-18 HISTORY — DX: Hypokalemia: E87.6

## 2011-03-18 MED ORDER — TRAMADOL HCL 50 MG PO TABS: 50.0000 mg | ORAL_TABLET | Freq: Four times a day (QID) | ORAL | Status: DC | PRN

## 2011-03-18 NOTE — Assessment & Plan Note (Signed)
Pt's discharge summary says she has not been taking her thyroid medicine. She did not relay this to me but I left a message on her home phone: If she has been taking it daily prior to going into the hospital then she should continue it. If she was not taking it prior to going into the hospital she should just get a TSH rechecked in 2 months.  I have put in a future order for TSH.

## 2011-03-18 NOTE — Patient Instructions (Signed)
Restart the Metorpolol daily for blood pressure.  After you have been taking the Valsartin and Metoprolol for 1 week, come back to get your BP checked by the nurse.  Restart the Thryoid  Medicine.  Stop the Glipizide, I don't think it's helping anyways.  Use the Tramadol as needed but if it's not helping try the Oxycodone (it's not Vicodin). If you have chest pains when you start to exercise call us back or call Dr. York Spaniel office so we can get a stress test ordered.  Get your blood potassium level checked next week. Call the day or two before to make a lab appointment to get blood drawn. Be fasting for this for at least 8 hours.

## 2011-03-18 NOTE — Assessment & Plan Note (Signed)
Not better with injection.  Forms filled out so that pt can get rehab services on it.  If that does not work she will need a referral to a Hydrographic surveyor.

## 2011-03-18 NOTE — Assessment & Plan Note (Signed)
Improved now that the patient is taking her lantus as prescribed.  Stopped Glipizide.  Continue lantus 60 units.  Will need recheck of A1c in August or September.

## 2011-03-18 NOTE — Progress Notes (Signed)
Chest pain: Pt had some chest pains that were off and on prior to going to the hospital. She was ruled out for an MI while in the hospital. Pt says that she has pain that lasts 10-20 min, if relieved by massaging her left chest, that pain seems to be on the outside under her breast. She also has Berline Lopes from lifting her grandchildren and does note that the pain is worse when she does move activity (like lifting grandchildren) and when she sleeps on her left side. She had a chemical stress test 2-3 years ago at Dr. York Spaniel office and was told that she was fine and released for the next 1 year. She has not been back because she has a bill that she owes.    Dizziness: Pt has gone to one session of vestibulochoclear rehab and is doing well with it. She says that she is using her walker to walk when she needs it but she does not need it all the time.   Hypokalemia: Pt had some hypokalemia and muscle aches when she went into the hospital. She says that she was suppose to be on Potassium pills years ago but she has not been taking them because they were big and she didn't like swallowing them. She is taking them now and feels better.   Diabetes: Pt has been taking her Lantus as prescribed and is doing well with her DM2. Her last 4 days of CBG's have been 103-101-93 and 93. She says she is going to start exercising slowly with her granddaughters. Pt is not taking Glipizide and i don't feel she needs it.   Thyroid: It appears from the discharge summary that the patient had not been taking her thyroid medicine the way she was suppose to but her TSH was normal. So, I left a message with the patient after she left to not restart it if she had not been taking it prior to going to the hospital.   Margret Chance: Pt is still having some wrist pain. She says the injection didn't help much at all. She has a request for wrist rehab sent over from her rehab place and I have filled it out. I will fax it back today.  She is not using the thumb spica splint because she says that she can't wipe her rear-end with it on and it takes too long to get off and on. She is wearing a soft sleeve instead but it's not helping much. She had surgery for carpel tunnel done on the other hand in the past.   Ros:  Neg except as noted above  PE:  Gen: seated on table comfortably.  MSK: left wrist swelling and tenderness over the tendon. Pt has sleeve that she puts on and off the arm.

## 2011-03-18 NOTE — Assessment & Plan Note (Signed)
Pt is to continue using the Valsartan until it is gone and then use her Diovan again (she has a lot of it at home).  THe HCTZ in the Diovan may be dropping her potassium so it will need to be rechecked. However, she had low potassium before and was suppose to be on the potassium pills so it may not be the diovan. It will need to be rechecked about 2-3 weeks after restarting the Diovan.  Added Back the Toprol XL 50 today.  Pt to return in 1 week for a BP check and to get her BMET done to check potassium

## 2011-03-18 NOTE — Assessment & Plan Note (Signed)
Pt had some lows in the hosptial.  Was suppose to be on potassium pills she says but she has not been taking them.  Restarted them and will call us with the dosage that she is taking.  Will recheck her potassium next week when she comes in for a BP check.

## 2011-03-20 ENCOUNTER — Ambulatory Visit: Payer: Medicare HMO | Admitting: Physical Therapy

## 2011-03-23 ENCOUNTER — Ambulatory Visit: Payer: Medicare HMO | Admitting: Physical Therapy

## 2011-03-23 NOTE — Discharge Summary (Signed)
NAME:  Kristine Boyer, Kristine Boyer NO.:  [id number]  MEDICAL RECORD NO.:  [id number]  LOCATION:  [facility]                         FACILITY:  [facility]  PHYSICIAN:  Nestor Ramp, MD        DATE OF BIRTH:  10-Aug-1941  DATE OF ADMISSION:  03/09/2011 DATE OF DISCHARGE:  03/11/2011                              DISCHARGE SUMMARY   PRIMARY CARE PROVIDER:  Dr. Jamie Brookes at [facility].  DISCHARGE DIAGNOSES: 1. Dizziness. 2. Hypokalemia, resolved. 3. Chest pain, noncardiac. 4. Type 2 diabetes. 5. Hypertension. 6. Hypothyroidism. 7. Hyperlipidemia. 8. Chronic pain. 9. Gastroesophageal reflux disease.  DISCHARGE MEDICATIONS: 1. Valium 2 mg p.o. t.i.d. p.r.n. dizziness. 2. Valsartan 160 mg p.o. daily. 3. Aspirin 325 mg p.o. daily. 4. Calcium carbonate 1 tablet p.o. q.a.m. 5. Lantus 60 units subcu q.a.m. 6. Multivitamin 1 daily. 7. Protonix 40 mg p.o. q.a.m. 8. Simvastatin 40 mg p.o. q.a.m. 9. Tramadol 50 mg p.o. q.6 h. p.r.n. pain.  Medications which were stopped during this hospitalization. 1. Diovan HCT 160/25 mg p.o. daily. 2. Levothyroxine 175 mcg p.o. daily. 3. Toprol-XL 50 mg p.o. q.a.m. 4. Glipizide XL 10 mg p.o. q.a.m.  CONSULTS:  None.  PROCEDURES: 1. Head CT on June 11 showing no evidence of acute intracranial     hemorrhage, mass, lesion or acute infarct. 2. Chest x-ray on June 11 showing mild cardiomegaly, no focal     pulmonary abnormality.  LABS: On admission, the patient had mildly elevated white count of 11.4, hemoglobin 12.6, this had improved to 10.6, 12.3 at the time of discharge.  BMET showed potassium of 2.9, decreased to 2.7 at the time of discharge, improved to 3.8.  Magnesium at 2.7, improved to 2.4 at the time of discharge.  Hemoglobin A1c 10.1.  Cardiac enzymes negative x3, mildly elevated.  Total CK 279 trended down to 190.  Urinalysis negative.  Urine culture negative.  TSH 3.535.  BRIEF HOSPITAL COURSE:  This is a  70 year old female with history of vertigo presenting with dizziness, also found to have chest pain while in the emergency department. 1. Chest pain.  This is a secondary complaint to the patient.  Her     most recent chest pain episode had been approximately 1 week prior     to admission.  Cardiac enzymes were cycled and were negative x3.     Risk stratification was done.  Primary care provider could consider     an outpatient stress test if the patient does have multiple risk     factors for coronary artery disease.  The patient was continued on     statin, blood pressure control and diabetes control. 2. Dizziness.  The patient with history of vertigo for greater than 10     years.  No source has been found.  CT of her head was negative.     Physical therapy evaluated the patient and felt that she would do     better at home with a rolling walker to decrease the risk for     falls.  The patient is also to follow up at neuro rehab and the  vestibular rehab the day after discharge on June 14 for further     evaluation and workup.  The patient was also started on a low dose     of Valium at 2 mg t.i.d. p.r.n. as this helped the patient to sleep     and often she wakes up with dizziness improved. 3. Hypokalemia.  The patient initially came in with a potassium of 2.9     which decreased to 2.7 after 40 mEq of KCl.  The patient was     started on IV fluids with potassium in them as well as a total of     120 mEq of potassium.  In addition, the patient was given 2 g of     magnesium.  After all of this, the patient's potassium had improved     to 4.0.  On the day of discharge it was 3.8.  The patient has had a     history of normal potassium in February 2011, so this is most     likely due to her HCTZ medication.  However if the patient     continues to be hypokalemic despite stopping this medication,     further renal workup could be done as an outpatient. 4. Type 2 diabetes.  The  patient's hemoglobin A1c was elevated at     10.1.  According to the patient she was not taking her glipizide at     home.  She was taking her Lantus at 60 units most night.  The     patient was continued on her home Lantus and sliding scale insulin.     Glipizide was not continued as the patient is at increased risk for     falls already without having hypoglycemic events.  The patient will     likely need to increase Lantus dosing as an outpatient. 5. Hypertension.  The patient remained normotensive endorsing that she     had only been taking her blood pressure medications for     approximately 3 days prior to admission.  The patient was restarted     only on her ARB.  Blood pressures remained well controlled in the     110s to 130s.  If the patient needs better control, could consider     adding back Toprol XR 50 mg as an outpatient.  However, the patient     should likely not be restarted on HCTZ due to hypokalemia. 6. Hypothyroidism.  The patient's TSH was within normal limits at 3.5     despite not taking Synthroid.  We will hold and recheck TSH in 2-3     months.  PCP could consider rechecking TSH sooner if the patient     becomes symptomatic. 7. Hyperlipidemia.  The patient was continued on her home statin.     Fasting lipid panel was relatively unremarkable with cholesterol     149, triglycerides 87, HDL 66 and LDL of 66.  DISCHARGE INSTRUCTIONS:  The patient was instructed to use her walker at home, to continue a low-sodium heart-healthy carb-consistent diet, increase activity slowly and walk with assistance  FOLLOWUP APPOINTMENTS: 1. Dr. Clotilde Dieter at [facility] on Wednesday June 20 at     10:00 a.m. 2. [facility] on Thursday June 14 at 7:45 a.m.  DISCHARGE CONDITION:  The patient was discharged home in stable medical condition with only minimal dizziness and no nausea.    ______________________________ Demetria Pore,  MD   ______________________________ Huntley Dec  Kerri Perches, MD    JM/MEDQ  D:  03/11/2011  T:  03/12/2011  Job:  [id number]  cc:   Jamie Brookes, MD  Electronically Signed by Demetria Pore MD on 03/12/2011 08:42:51 PM Electronically Signed by Denny Levy MD on 03/23/2011 08:23:32 AM

## 2011-03-26 ENCOUNTER — Other Ambulatory Visit: Payer: Medicare HMO

## 2011-03-26 ENCOUNTER — Ambulatory Visit (INDEPENDENT_AMBULATORY_CARE_PROVIDER_SITE_OTHER): Payer: Medicare HMO | Admitting: *Deleted

## 2011-03-26 ENCOUNTER — Ambulatory Visit: Payer: Medicare HMO | Admitting: Physical Therapy

## 2011-03-26 VITALS — BP 164/94

## 2011-03-26 DIAGNOSIS — E876 Hypokalemia: Secondary | ICD-10-CM

## 2011-03-26 DIAGNOSIS — I10 Essential (primary) hypertension: Secondary | ICD-10-CM

## 2011-03-26 DIAGNOSIS — E039 Hypothyroidism, unspecified: Secondary | ICD-10-CM

## 2011-03-26 LAB — TSH: TSH: 9.253 u[IU]/mL — ABNORMAL HIGH (ref 0.350–4.500)

## 2011-03-26 LAB — BASIC METABOLIC PANEL
CO2: 29 mEq/L (ref 19–32)
Calcium: 8.7 mg/dL (ref 8.4–10.5)
Creat: 0.91 mg/dL (ref 0.50–1.10)

## 2011-03-26 NOTE — Progress Notes (Signed)
Bmp and tsh done today Kristine Boyer 

## 2011-03-26 NOTE — Progress Notes (Signed)
With her BP that night I think she should be on the 50 mg. Please let her know. Thanks.

## 2011-03-26 NOTE — Progress Notes (Signed)
Check BP after her lab visit.  BP was 164/94.  She said that Dr. Clotilde Dieter instructed her to restart the Toprol.  She has a 25 mg pill and a 50 mg pill - is unsure which one to take.  Told her I would route this note to her for clarification and would call her back.

## 2011-03-30 ENCOUNTER — Ambulatory Visit: Payer: Medicare HMO | Attending: Family Medicine | Admitting: Physical Therapy

## 2011-03-30 ENCOUNTER — Telehealth: Payer: Self-pay | Admitting: Family Medicine

## 2011-03-30 DIAGNOSIS — IMO0001 Reserved for inherently not codable concepts without codable children: Secondary | ICD-10-CM | POA: Insufficient documentation

## 2011-03-30 DIAGNOSIS — R269 Unspecified abnormalities of gait and mobility: Secondary | ICD-10-CM | POA: Insufficient documentation

## 2011-03-30 DIAGNOSIS — H8110 Benign paroxysmal vertigo, unspecified ear: Secondary | ICD-10-CM | POA: Insufficient documentation

## 2011-03-30 NOTE — Telephone Encounter (Signed)
Left message that wish to discuss the thhyroid blood test (TSH) result and whether patient has been taking her thyroid medication and how much.

## 2011-03-31 ENCOUNTER — Telehealth: Payer: Self-pay | Admitting: Family Medicine

## 2011-03-31 ENCOUNTER — Other Ambulatory Visit: Payer: Self-pay | Admitting: Family Medicine

## 2011-03-31 MED ORDER — LEVOTHYROXINE SODIUM 175 MCG PO TABS: 175.0000 ug | ORAL_TABLET | Freq: Every day | ORAL | Status: DC

## 2011-03-31 NOTE — Telephone Encounter (Signed)
I spoke with Kristine Boyer about her elevated TSH  Lab Results  Component Value Date   TSH 9.253* 03/26/2011   She had been taking her Levothyroxine 175 mcg every other day until 03/09/11 when she started taking it daily.   This recent elevated TSH was measured after patient had been taking daily dose of levothyroxine for only 2 weeks.   I encouraged Kristine Boyer to continue taking her Levothyroxine 175 mcg daily and come in to meet her new doctor and have her TSH check in 4 to six weeks.

## 2011-04-03 ENCOUNTER — Encounter: Payer: Medicare HMO | Admitting: Physical Therapy

## 2011-04-06 ENCOUNTER — Ambulatory Visit: Payer: Medicare HMO | Admitting: Physical Therapy

## 2011-04-07 ENCOUNTER — Ambulatory Visit: Payer: Medicare HMO | Admitting: Occupational Therapy

## 2011-04-09 ENCOUNTER — Ambulatory Visit: Payer: Medicare HMO | Admitting: Occupational Therapy

## 2011-04-09 ENCOUNTER — Encounter: Payer: Medicare HMO | Admitting: Physical Therapy

## 2011-04-14 ENCOUNTER — Ambulatory Visit: Payer: Medicare HMO | Admitting: Occupational Therapy

## 2011-04-16 ENCOUNTER — Ambulatory Visit: Payer: Medicare HMO | Admitting: Physical Therapy

## 2011-04-16 ENCOUNTER — Encounter: Payer: Medicare HMO | Admitting: Occupational Therapy

## 2011-04-21 ENCOUNTER — Ambulatory Visit: Payer: Medicare HMO | Admitting: Occupational Therapy

## 2011-04-23 ENCOUNTER — Ambulatory Visit: Payer: Medicare HMO | Admitting: Physical Therapy

## 2011-04-23 ENCOUNTER — Encounter: Payer: Medicare HMO | Admitting: Occupational Therapy

## 2011-04-25 ENCOUNTER — Encounter: Payer: Self-pay | Admitting: Family Medicine

## 2011-04-28 ENCOUNTER — Ambulatory Visit: Payer: Medicare HMO | Admitting: Occupational Therapy

## 2011-04-30 ENCOUNTER — Ambulatory Visit: Payer: Medicare HMO | Attending: Family Medicine | Admitting: Occupational Therapy

## 2011-04-30 DIAGNOSIS — IMO0001 Reserved for inherently not codable concepts without codable children: Secondary | ICD-10-CM | POA: Insufficient documentation

## 2011-04-30 DIAGNOSIS — R269 Unspecified abnormalities of gait and mobility: Secondary | ICD-10-CM | POA: Insufficient documentation

## 2011-04-30 DIAGNOSIS — H8110 Benign paroxysmal vertigo, unspecified ear: Secondary | ICD-10-CM | POA: Insufficient documentation

## 2011-05-05 ENCOUNTER — Ambulatory Visit: Payer: Medicare HMO | Admitting: Occupational Therapy

## 2011-05-07 ENCOUNTER — Ambulatory Visit: Payer: Medicare HMO | Admitting: Occupational Therapy

## 2011-05-11 ENCOUNTER — Ambulatory Visit: Payer: Medicare HMO | Admitting: Occupational Therapy

## 2011-05-15 ENCOUNTER — Ambulatory Visit: Payer: Medicare HMO | Admitting: Occupational Therapy

## 2011-05-18 ENCOUNTER — Encounter: Payer: Self-pay | Admitting: Family Medicine

## 2011-05-18 ENCOUNTER — Ambulatory Visit (INDEPENDENT_AMBULATORY_CARE_PROVIDER_SITE_OTHER): Payer: Medicare HMO | Admitting: Family Medicine

## 2011-05-18 VITALS — BP 177/84 | HR 83 | Temp 98.3°F | Ht 64.0 in | Wt 214.0 lb

## 2011-05-18 DIAGNOSIS — H8110 Benign paroxysmal vertigo, unspecified ear: Secondary | ICD-10-CM

## 2011-05-18 DIAGNOSIS — M654 Radial styloid tenosynovitis [de Quervain]: Secondary | ICD-10-CM

## 2011-05-18 DIAGNOSIS — E119 Type 2 diabetes mellitus without complications: Secondary | ICD-10-CM

## 2011-05-18 DIAGNOSIS — I10 Essential (primary) hypertension: Secondary | ICD-10-CM

## 2011-05-18 DIAGNOSIS — E039 Hypothyroidism, unspecified: Secondary | ICD-10-CM

## 2011-05-18 HISTORY — DX: Benign paroxysmal vertigo, unspecified ear: H81.10

## 2011-05-18 MED ORDER — ASPIRIN EC 81 MG PO TBEC: 81.0000 mg | DELAYED_RELEASE_TABLET | Freq: Every day | ORAL | Status: DC

## 2011-05-18 MED ORDER — METOPROLOL SUCCINATE ER 50 MG PO TB24: 50.0000 mg | ORAL_TABLET | Freq: Every day | ORAL | Status: DC

## 2011-05-18 NOTE — Assessment & Plan Note (Signed)
BP: 177/84 mmHg  Out of metop.  Refilled.  Pt to f/u in 1-2 weeks with PCP

## 2011-05-18 NOTE — Assessment & Plan Note (Signed)
Lab Results  Component Value Date   HGBA1C 10.1* 03/10/2011   Poor control.  Not due for a1c until 9/12.  Asked pt to go up on lantus to 70 total, 50 in AM and 20 in pm.  To f/u with PCP in 2 weeks.

## 2011-05-18 NOTE — Assessment & Plan Note (Signed)
Pt responded poorly to steroid injection.  Still significant pain and swelling. Working with PT and not much improved.  Will ask [facility] to evaluate for next steps.

## 2011-05-18 NOTE — Assessment & Plan Note (Signed)
Has been in neuro PT for months, she states that her PT thinks she needs to see a neurologist.  I asked her to bring records with her to next visit and her PCP will decide if referral is necessary.  No dizziness today with normal neuro exam

## 2011-05-18 NOTE — Progress Notes (Signed)
  Subjective:    Patient ID: Kristine Boyer, female    DOB: 01-02-1941, 70 y.o.   MRN: [id number]  HPI Hypothyroid- taking synthroid daily.  No dry skin or constipation  Vertigo- BPPV, has been seeing PT for months, she states that they would like her to see a neurologist.  She last had an episode of vertigo 5 days ago.  This lasted all day.  It was associated with nausea.  No slurred speech or HA.  Takes valium for nausea when needed.    Wrist pain-  Diagnosed by Dr. Clotilde Dieter as de Quervain's.  Injection did not help and actually bleached her skin.  She still has pain and swelling.  She is seeing a PT for this as well and her ROM has improved but not her pain.    HTN-  Out of metoprolol.  No HA or CP  Review of Systems See above     Objective:   Physical Exam Vital signs reviewed General appearance - alert, well appearing, and in no distress and oriented to person, place, and time Neurological - alert, oriented, normal speech, no focal findings or movement disorder noted, screening mental status exam normal, neck supple without rigidity, cranial nerves II through XII intact Heart - normal rate, regular rhythm, normal S1, S2, no murmurs, rubs, clicks or gallops Chest - clear to auscultation, no wheezes, rales or rhonchi, symmetric air entry, no tachypnea, retractions or cyanosis Left wrist with swelling along lower 1/3 of radius, TTP lower 1/3 radius to 1st MCP.  Skin whitened in small patch.         Assessment & Plan:  HYPOTHYROIDISM, UNSPECIFIED Taking meds.  Check TSh today  DIABETES MELLITUS II, UNCOMPLICATED Lab Results  Component Value Date   HGBA1C 10.1* 03/10/2011   Poor control.  Not due for a1c until 9/12.  Asked pt to go up on lantus to 70 total, 50 in AM and 20 in pm.  To f/u with PCP in 2 weeks.  HYPERTENSION, BENIGN SYSTEMIC BP: 177/84 mmHg  Out of metop.  Refilled.  Pt to f/u in 1-2 weeks with PCP  Tommi Rumps Quervain's syndrome (tenosynovitis) Pt responded poorly to  steroid injection.  Still significant pain and swelling. Working with PT and not much improved.  Will ask [facility] to evaluate for next steps.    BPPV (benign paroxysmal positional vertigo) Has been in neuro PT for months, she states that her PT thinks she needs to see a neurologist.  I asked her to bring records with her to next visit and her PCP will decide if referral is necessary.  No dizziness today with normal neuro exam

## 2011-05-18 NOTE — Patient Instructions (Signed)
I would like you to bring your meter next visit  Please go up to 70 on the lantus, so 50 in the morning and 20 at night  I will check your thyroid level today  Please make an appt with sports medicine clinic for your wrist  Ask your therapist to send a letter to your new doctor, Dr. Konrad Dolores to tell him her concerns and recommendations  Make an appt to see Dr. Konrad Dolores as soon as he is available.

## 2011-05-18 NOTE — Assessment & Plan Note (Signed)
Taking meds.  Check TSh today

## 2011-05-19 ENCOUNTER — Ambulatory Visit: Payer: Medicare HMO | Admitting: Occupational Therapy

## 2011-05-20 ENCOUNTER — Ambulatory Visit: Payer: Medicare HMO | Admitting: Physical Therapy

## 2011-05-21 ENCOUNTER — Ambulatory Visit: Payer: Medicare HMO | Admitting: Physical Therapy

## 2011-05-22 ENCOUNTER — Encounter: Payer: Medicare HMO | Admitting: Occupational Therapy

## 2011-05-26 ENCOUNTER — Encounter: Payer: Medicare HMO | Admitting: Occupational Therapy

## 2011-05-26 ENCOUNTER — Ambulatory Visit: Payer: Medicare HMO | Admitting: Physical Therapy

## 2011-05-28 ENCOUNTER — Ambulatory Visit: Payer: Medicare HMO | Admitting: Physical Therapy

## 2011-06-02 ENCOUNTER — Ambulatory Visit (INDEPENDENT_AMBULATORY_CARE_PROVIDER_SITE_OTHER): Payer: Medicare HMO | Admitting: Sports Medicine

## 2011-06-02 ENCOUNTER — Encounter: Payer: Self-pay | Admitting: Sports Medicine

## 2011-06-02 VITALS — BP 177/97 | HR 65 | Ht 63.0 in | Wt 216.0 lb

## 2011-06-02 DIAGNOSIS — M654 Radial styloid tenosynovitis [de Quervain]: Secondary | ICD-10-CM

## 2011-06-02 DIAGNOSIS — M1990 Unspecified osteoarthritis, unspecified site: Secondary | ICD-10-CM

## 2011-06-02 MED ORDER — KETOPROFEN POWD: Status: DC

## 2011-06-02 NOTE — Progress Notes (Signed)
  Subjective:    Patient ID: Genella Mech, female    DOB: Nov 28, 1940, 70 y.o.   MRN: [id number]  HPI  Left wrist pain x 2 mo Noticed after lifting grandchild at home Associated swelling and pain radiating up arm Seen at [facility] clinic and given cortisone injection No improvement noted and skin lightening over area if injection  PT x 1.5-2 mo with minimal improvement Wearing thumb spica splint  Currently swollen with 3/10 pain Sharp pain worse with movement or lifting Better with rest, and tramadol Some achy left shoulder pain assocated  No hx trauma.  Review of Systems  No fever, chills, sweats Skin lightening noted along wrist, no bruising or swelling Neuro - no hand numbness or tingling Musk/skel - wrist pain as described above     Objective:   Physical Exam  Left Wrist: Swelling noted along radial portion of wrist proximal to joint line w/ moderate TTP. ROM smooth and dec due to pain in flexion, extension, and ulnar/radial deviation. Decreased motion compared to left. Palpation is normal over navicular, lunate, and TFCC Left CMC joint tender with ROM and compression. Tendons with tenderness/ swelling over the radial aspect of wrist. Strength 5/5 in all directions. Positive Finkelstein, neg tinel's and phalens.  Right wrist Inspection normal with no visible erythema or swelling. ROM smooth and normal with good flexion and extension and ulnar/radial deviation that is symmetrical with opposite wrist. Palpation is normal over metacarpals, navicular, lunate, and TFCC; tendons without tenderness/ swelling Strength 5/5 in all directions without pain. Negative Finkelstein, pos tinel's and phalens.  MSK [place] - left wrist Diffuse swelling in compartment 1 Extensor pollicis longus intact Abductor pollicis brevis intact, possibly split CMC joint with spurring and mild effusion      Assessment & Plan:

## 2011-06-02 NOTE — Assessment & Plan Note (Addendum)
No improvement. Given soft wrist brace. Wrist exercises daily. Continue PT x 4 wks. Given an RX for top ketoprofen to try qid as well  Reck 4 to 6 wks

## 2011-06-02 NOTE — Assessment & Plan Note (Signed)
Left CMC joint. Tramadol as needed. Gentle wrist exercises and PT. Soft wrist brace, or thumb spica if worsens.

## 2011-06-02 NOTE — Patient Instructions (Signed)
Ice wrist 3 times a day. (Frozen cups.  Wear compression wrist brace as tolerated. Wear stiff wrist brace if wrist feels worse.  Gentle range of motion exercises, gentle ball squeezes. Work both wrist and shoulders.  Continue PT x 4 wks.  Follow up in 4 wks.

## 2011-06-03 ENCOUNTER — Encounter: Payer: Medicare HMO | Admitting: Occupational Therapy

## 2011-06-03 ENCOUNTER — Ambulatory Visit: Payer: Medicare HMO | Attending: Family Medicine | Admitting: Physical Therapy

## 2011-06-03 DIAGNOSIS — H8110 Benign paroxysmal vertigo, unspecified ear: Secondary | ICD-10-CM | POA: Insufficient documentation

## 2011-06-03 DIAGNOSIS — R269 Unspecified abnormalities of gait and mobility: Secondary | ICD-10-CM | POA: Insufficient documentation

## 2011-06-03 DIAGNOSIS — IMO0001 Reserved for inherently not codable concepts without codable children: Secondary | ICD-10-CM | POA: Insufficient documentation

## 2011-06-05 ENCOUNTER — Encounter: Payer: Medicare HMO | Admitting: Occupational Therapy

## 2011-06-05 ENCOUNTER — Ambulatory Visit: Payer: Medicare HMO | Admitting: Physical Therapy

## 2011-06-06 ENCOUNTER — Observation Stay (HOSPITAL_COMMUNITY)
Admission: EM | Admit: 2011-06-06 | Discharge: 2011-06-09 | Disposition: A | Discharge status: Home / Self Care | Payer: Medicare HMO | Attending: Family Medicine | Admitting: Family Medicine

## 2011-06-06 ENCOUNTER — Emergency Department (HOSPITAL_COMMUNITY): Payer: Medicare HMO

## 2011-06-06 DIAGNOSIS — R0789 Other chest pain: Principal | ICD-10-CM | POA: Insufficient documentation

## 2011-06-06 DIAGNOSIS — E785 Hyperlipidemia, unspecified: Secondary | ICD-10-CM | POA: Insufficient documentation

## 2011-06-06 DIAGNOSIS — Z23 Encounter for immunization: Secondary | ICD-10-CM | POA: Insufficient documentation

## 2011-06-06 DIAGNOSIS — E119 Type 2 diabetes mellitus without complications: Secondary | ICD-10-CM | POA: Insufficient documentation

## 2011-06-06 DIAGNOSIS — I10 Essential (primary) hypertension: Secondary | ICD-10-CM | POA: Insufficient documentation

## 2011-06-06 DIAGNOSIS — G4730 Sleep apnea, unspecified: Secondary | ICD-10-CM | POA: Insufficient documentation

## 2011-06-06 DIAGNOSIS — E669 Obesity, unspecified: Secondary | ICD-10-CM | POA: Insufficient documentation

## 2011-06-06 DIAGNOSIS — Z7902 Long term (current) use of antithrombotics/antiplatelets: Secondary | ICD-10-CM | POA: Insufficient documentation

## 2011-06-06 DIAGNOSIS — H8110 Benign paroxysmal vertigo, unspecified ear: Secondary | ICD-10-CM | POA: Insufficient documentation

## 2011-06-06 DIAGNOSIS — E039 Hypothyroidism, unspecified: Secondary | ICD-10-CM | POA: Insufficient documentation

## 2011-06-06 DIAGNOSIS — K219 Gastro-esophageal reflux disease without esophagitis: Secondary | ICD-10-CM | POA: Insufficient documentation

## 2011-06-06 DIAGNOSIS — Z794 Long term (current) use of insulin: Secondary | ICD-10-CM | POA: Insufficient documentation

## 2011-06-06 DIAGNOSIS — Z79899 Other long term (current) drug therapy: Secondary | ICD-10-CM | POA: Insufficient documentation

## 2011-06-06 DIAGNOSIS — M5020 Other cervical disc displacement, unspecified cervical region: Secondary | ICD-10-CM | POA: Insufficient documentation

## 2011-06-06 LAB — DIFFERENTIAL
Basophils Absolute: 0 10*3/uL (ref 0.0–0.1)
Basophils Relative: 0 % (ref 0–1)
Eosinophils Absolute: 0.1 10*3/uL (ref 0.0–0.7)
Eosinophils Relative: 1 % (ref 0–5)
Monocytes Absolute: 0.7 10*3/uL (ref 0.1–1.0)
Neutro Abs: 5.9 10*3/uL (ref 1.7–7.7)

## 2011-06-06 LAB — CBC
Hemoglobin: 12 g/dL (ref 12.0–15.0)
MCHC: 34.2 g/dL (ref 30.0–36.0)
Platelets: 199 10*3/uL (ref 150–400)
RDW: 13.6 % (ref 11.5–15.5)

## 2011-06-06 LAB — POCT I-STAT TROPONIN I: Troponin i, poc: 0.01 ng/mL (ref 0.00–0.08)

## 2011-06-07 ENCOUNTER — Encounter: Payer: Self-pay | Admitting: Internal Medicine

## 2011-06-07 LAB — CARDIAC PANEL(CRET KIN+CKTOT+MB+TROPI)
Relative Index: 1.6 (ref 0.0–2.5)
Total CK: 193 U/L — ABNORMAL HIGH (ref 7–177)
Total CK: 209 U/L — ABNORMAL HIGH (ref 7–177)
Troponin I: <0.3 ng/mL (ref <0.30)

## 2011-06-07 LAB — GLUCOSE, CAPILLARY
Glucose-Capillary: 174 mg/dL — ABNORMAL HIGH (ref 70–99)
Glucose-Capillary: 165 mg/dL — ABNORMAL HIGH (ref 70–99)
Glucose-Capillary: 154 mg/dL — ABNORMAL HIGH (ref 70–99)
Glucose-Capillary: 218 mg/dL — ABNORMAL HIGH (ref 70–99)

## 2011-06-07 LAB — LIPID PANEL
LDL Cholesterol: 115 mg/dL — ABNORMAL HIGH (ref 0–99)
Total CHOL/HDL Ratio: 3.3 RATIO

## 2011-06-07 LAB — BASIC METABOLIC PANEL
Chloride: 107 mEq/L (ref 96–112)
Creatinine, Ser: 0.91 mg/dL (ref 0.50–1.10)
GFR calc Af Amer: >60 mL/min (ref >60)
GFR calc non Af Amer: >60 mL/min (ref >60)
Potassium: 3.2 mEq/L — ABNORMAL LOW (ref 3.5–5.1)

## 2011-06-07 LAB — COMPREHENSIVE METABOLIC PANEL
AST: 17 U/L (ref 0–37)
Albumin: 3.5 g/dL (ref 3.5–5.2)
Calcium: 9.1 mg/dL (ref 8.4–10.5)
Creatinine, Ser: 1.02 mg/dL (ref 0.50–1.10)
Total Protein: 7.4 g/dL (ref 6.0–8.3)

## 2011-06-07 LAB — POCT I-STAT TROPONIN I

## 2011-06-07 NOTE — H&P (Signed)
Hospital Admission Note Date: 06/07/2011  Patient name:  Kristine Boyer  Medical record number:  [id number] Date of birth:  January 19, 1941  Age: 70 y.o. Gender:  female PCP:    MERRELL, DAVID, MD, MD  Medical Service:   Family medicine teaching service   Attending physician:  Dr. Julaine Fusi                                       First Contact   Pager: [phone]      Second Contact  Pager: [phone]   Chief Complaint:Chest pain  History of Present Illness: Patient is a 70 y.o. female with a PMHx of Type 2 DM, HTN , hypothyroidism comes to the ER with left sided chest pain.  She states that she was upset about her grand baby swallowing coins and began having left sided chest pain. She describes her pain as thrusting, 10/10, associated with SOB and palpitations but denies any nausea, vomiting or diaphoresis associated with it.The exact duration of chest pain was not known but probably lasted between 30 mins - 1 hour.  Her chest pain got better with aspirin and NTG that was given to her in the EMS.She also mentions that she had the urge to go to the bathroom but she ended urinating in her underpants and felt quite embarrassed. Patient's daughter at the bedside reported that she passed out but patient could not recall that. Patient states that even if she lost conscious it was very transient and  probably lasting for few seconds. By the time , we evaluated her, she was completely pain free.   Current Outpatient Medications: Current Outpatient Prescriptions  Medication Sig Dispense Refill  . aspirin EC 81 MG tablet Take 1 tablet (81 mg total) by mouth daily.  150 tablet  2  . calcium carbonate (OS-CAL) 600 MG TABS Take 600 mg by mouth 2 (two) times daily with a meal.        . insulin glargine (LANTUS) 100 UNIT/ML injection Inject 60 Units into the skin daily.        . Ketoprofen POWD Ketoprofen 20% gel.  Apply to affected area four times per day as needed.  60 g  PRN  . levothyroxine (SYNTHROID,  LEVOTHROID) 175 MCG tablet Take 1 tablet (175 mcg total) by mouth daily.  30 tablet  3  . metoprolol (TOPROL-XL) 50 MG 24 hr tablet Take 1 tablet (50 mg total) by mouth daily.  31 tablet  3  . Multiple Vitamin (MULTIVITAMIN) tablet Take 1 tablet by mouth daily.        . pantoprazole (PROTONIX) 40 MG tablet Take 1 tablet (40 mg total) by mouth daily. For reflux; has failed trial of otc prilosec for 3 months and prescription omeprazole for 3 months  30 tablet  3  . simvastatin (ZOCOR) 40 MG tablet Take 40 mg by mouth daily.        . valsartan-hydrochlorothiazide (DIOVAN-HCT) 160-25 MG per tablet Take 1 tablet by mouth daily.          Allergies: Vicodin  Past Medical History: HTN Diabetes Hypothyroidism Osteoarthritis Migraine BPPV  Past Surgical History: Past Surgical History  Procedure Date  . Abdominal hysterectomy   . Tonsilectomy, adenoidectomy, bilateral myringotomy and tubes     Family History: Family History  Problem Relation Age of Onset  . Diabetes Mother     Social History: History  Social History  . Marital Status: Single    Spouse Name: N/A    Number of Children: 3  . Years of Education: N/A   Occupational History  . retiredCabin crew    Social History Main Topics  . Smoking status: Former Smoker    Quit date: 02/09/1986  . Smokeless tobacco: Never Used  . Alcohol Use: 0.5 oz/week    1 drink(s) per week  . Drug Use: No  . Sexually Active: Not on file   Other Topics Concern  . Not on file   Social History Narrative   Health Care POA: Emergency Contact: Daughter, Dorothyann Peng [phone] of Life Plan: Who lives with you: Lives with daughter, granddaughter,  great grand daughter and house mate. Any pets: noneDiet: Patient currently does not follow a diabetic diet plan.  She reports eating sugary foods often.Exercise: Patient does not have a current exercise plan.Seatbelts: Patient reports wearing her seatbelt when she is in vehicle.Wynelle Link  Exposure/Protection: Hobbies: Dancing, watching tv, swimming, singing    Review of Systems: Pertinent items are noted in HPI.  Vital Signs: T: 98.1 P: 76 BP: 189/102> 181/92 RR: 17 O2 sat: 100% on RA   Physical Exam: General: Vital signs reviewed and noted. Well-developed, well-nourished, in no acute distress; alert, appropriate and cooperative throughout examination.  Head: Normocephalic, atraumatic.  Eyes: PERRL, EOMI, No signs of anemia or jaundince.  Ears: TM nonerythematous, not bulging, good light reflex bilaterally.  Nose: Mucous membranes moist, not inflammed, nonerythematous.  Throat: Oropharynx nonerythematous, no exudate appreciated.   Neck: No deformities, masses, or tenderness noted.Supple, No carotid Bruits, no JVD.  Lungs:  Normal respiratory effort. Clear to auscultation BL without crackles or wheezes.  Heart: RRR. S1 and S2 normal without gallop, murmur, or rubs.  Abdomen:  BS normoactive. Soft, Nondistended, non-tender.  No masses or organomegaly.  Extremities: No pretibial edema.  Neurologic: A&O X3, CN II - XII are grossly intact. Motor strength is 5/5 in the all 4 extremities, Sensations intact to light touch, Cerebellar signs negative.  Skin: No visible rashes, scars.   Lab results: CBC:    Component Value Date/Time   WBC 10.3 06/06/2011 2331   HGB 12.0 06/06/2011 2331   HCT 35.1* 06/06/2011 2331   PLT 199 06/06/2011 2331   MCV 79.4 06/06/2011 2331   NEUTROABS 5.9 06/06/2011 2331   LYMPHSABS 3.6 06/06/2011 2331   MONOABS 0.7 06/06/2011 2331   EOSABS 0.1 06/06/2011 2331   BASOSABS 0.0 06/06/2011 2331      Comprehensive Metabolic Panel:    Component Value Date/Time   NA 145 06/06/2011 2331   K 3.1* 06/06/2011 2331   CL 105 06/06/2011 2331   CO2 33* 06/06/2011 2331   BUN 13 06/06/2011 2331   CREATININE 1.02 06/06/2011 2331   CREATININE 0.91 03/26/2011 0853   GLUCOSE 176* 06/06/2011 2331   CALCIUM 9.1 06/06/2011 2331   AST 17 06/06/2011 2331   ALT 11 06/06/2011 2331   ALKPHOS 96  06/06/2011 2331   BILITOT 0.2* 06/06/2011 2331   PROT 7.4 06/06/2011 2331   ALBUMIN 3.5 06/06/2011 2331     Lab Results  Component Value Date   CKTOTAL 190* 03/10/2011   CKMB 2.4 03/10/2011   TROPONINI <0.30 03/10/2011      Imaging results:  CXR: IMPRESSION: No acute cardiopulmonary process seen.   Assessment & Plan: #1Chest Pain: According to the story the pain is typical for angina and there is a definite concern for ACS, although since pain is  currently resolved with minimal meds, no EKG changes seen and troponins are negative x 1, the suspicion has come down. We will admit him for observation and obtain 2 more sets of CE along with 2D echo for any new wall motion abnormalities and assess LVEF. Other causes of Chest pain could be musculoskeletal, gastroesophageal(especially since she has GERD) and costochondritis. I will continue aspirin, metoprolol, morphine for pain, oxygen as needed and SL nitrate for CP. Patient is already on statin and will obatin FLP.  Monitor electrolytes. Patient was hyopokalemic in ED which was repleted.  #2 HTN: Continue home meds at this time. Valsartan-HCTZ and metoprolol.  #3 Diabetes type II: Continue home lantus along with SSI. last Hba1c 10.1 in August.  #4 Hyperlipidemia: Zocor  #5 GERD: Continue PPI's  #6 DVT prophy: Lovenox SQ         DVT PPX:      (PGY1):  ____________________________________    Date/ Time:      ____________________________________     Lars Mage, M.D. (Senior resident):    ____________________________________    Date/ Time:      ____________________________________     I have seen and examined the patient. I reviewed the resident/fellow note and agree with the findings and plan of care as documented. My additions and revisions are included.   Signature:  ____________________________________________     [facility] Teaching Service Attending    Date:    ____________________________________________

## 2011-06-08 LAB — BASIC METABOLIC PANEL
CO2: 28 mEq/L (ref 19–32)
Calcium: 9.4 mg/dL (ref 8.4–10.5)
Creatinine, Ser: 1.05 mg/dL (ref 0.50–1.10)
GFR calc Af Amer: >60 mL/min (ref >60)
GFR calc non Af Amer: 52 mL/min — ABNORMAL LOW (ref >60)
Sodium: 142 mEq/L (ref 135–145)

## 2011-06-08 LAB — GLUCOSE, CAPILLARY
Glucose-Capillary: 136 mg/dL — ABNORMAL HIGH (ref 70–99)
Glucose-Capillary: 153 mg/dL — ABNORMAL HIGH (ref 70–99)
Glucose-Capillary: 144 mg/dL — ABNORMAL HIGH (ref 70–99)

## 2011-06-09 ENCOUNTER — Observation Stay (HOSPITAL_COMMUNITY): Payer: Medicare HMO

## 2011-06-09 ENCOUNTER — Encounter: Payer: Self-pay | Admitting: Family Medicine

## 2011-06-09 ENCOUNTER — Ambulatory Visit: Payer: Medicare HMO | Admitting: Sports Medicine

## 2011-06-09 ENCOUNTER — Other Ambulatory Visit: Payer: Self-pay | Admitting: Family Medicine

## 2011-06-09 DIAGNOSIS — R928 Other abnormal and inconclusive findings on diagnostic imaging of breast: Secondary | ICD-10-CM

## 2011-06-09 LAB — GLUCOSE, CAPILLARY
Glucose-Capillary: 172 mg/dL — ABNORMAL HIGH (ref 70–99)
Glucose-Capillary: 200 mg/dL — ABNORMAL HIGH (ref 70–99)

## 2011-06-09 MED ORDER — TECHNETIUM TC 99M TETROFOSMIN IV KIT
10.0000 | PACK | Freq: Once | INTRAVENOUS | Status: AC | PRN
  Administered 2011-06-09: 10 via INTRAVENOUS

## 2011-06-09 MED ORDER — TECHNETIUM TC 99M TETROFOSMIN IV KIT
30.0000 | PACK | Freq: Once | INTRAVENOUS | Status: AC | PRN
  Administered 2011-06-09: 30 via INTRAVENOUS

## 2011-06-09 NOTE — Discharge Summary (Signed)
Physician Discharge Summary  Patient ID: Kristine Boyer MRN: [id number] DOB/AGE: January 11, 1941 70 y.o.  Admit date: 06/07/2011 Discharge date: 06/09/2011  Admission Diagnoses: chest pain  Patient Active Problem List  Diagnoses  . HYPOTHYROIDISM, UNSPECIFIED  . DIABETES MELLITUS II, UNCOMPLICATED  . HYPERLIPIDEMIA  . OBESITY  . HYPERTENSION, BENIGN SYSTEMIC  . CHRONIC SYSTOLIC HEART FAILURE  . ALLERGIC RHINITIS  . TOOTH LOSS  . GASTROESOPHAGEAL REFLUX, NO ESOPHAGITIS  . OSTEOARTHRITIS, MULTI SITES  . BACK PAIN, LUMBAR  . SACROILIAC JOINT DYSFUNCTION  . Foot pain, right  . De Quervain's syndrome (tenosynovitis)  . Hypokalemia  . BPPV (benign paroxysmal positional vertigo)     Discharge Diagnoses: Atypical chest pain, c3-c4 severe disk protrusion, HTN, hypothyroidism, vertigo  Discharged Condition: Stable medical condition  Hospital Course: Pt is a 70 yo F with PMH of HTN, DM, Hypothyroidism, BPPV who was admitted for new onset chest pain. 1. Chest pain: In the setting of stress. Resolved in ED with nitro and ASA. CE's negative, ECHO unremarkable. Dr. Donnie Aho with Cardiology was consulted to see pt. He also felt this was aytpical chest pain brought on by stress/anxiety in an acute event. He ordered Myoview which was normal of her heart, but did have questionable uptake in her breast. Pt discharged home with Nitro SL prn and Aspirin but would recommend follow-up mammogram for the nuclear tracer uptake.  2. HTN: Uncontrolled. Much higher on admission than throughout her admission. Pt on Benicar and HCTZ while in the hospital with BP's consistently in the 140's. Will need further outpatient management of HTN. 3. Disk disease: Dr. Donnie Aho thinks this should be addressed as a possible cause of pain in her chest/shoulder. Pt has seen neurosurgery in the past, but not recently. They were not consulted during the hospital stay, but if PCP thinks she would benefit from a further evaluation, would  recommend scheduling her with Neurosurg as an outpatient. (Last MRI was in 2006) 4. DM: Controlled as an inpatient on Lantus and SSI. Resumed home regimen at discharge. 5. BPPV: [facility] for [facility] which she states helps but Kristine Boyer wanted her to be referred for an ENT appointment. (Pt states therapist was sending a letter to Dr. Konrad Boyer in [facility] with her recommendations.) Pt will resume PT next week. 6. Sleep apnea: Daughter noted multiple apneic events during her hospitalization. Pt also has difficult to control HTN so I feel that a sleep study would be beneficial for her. This was not arranged while she was in the hospital.  Consults: cardiology  Significant Diagnostic Studies: labs: K was 3.1 on admission and it was repleted. Cardiac panel negative x3.  BMET    Component Value Date/Time   NA 142 06/08/2011 1036   K 3.5 06/08/2011 1036   CL 104 06/08/2011 1036   CO2 28 06/08/2011 1036   GLUCOSE 149* 06/08/2011 1036   BUN 14 06/08/2011 1036   CREATININE 1.05 06/08/2011 1036   CREATININE 0.91 03/26/2011 0853   CALCIUM 9.4 06/08/2011 1036   GFRNONAA 52* 06/08/2011 1036   GFRAA >60 06/08/2011 1036    Lipid Panel     Component Value Date/Time   CHOL 199 06/07/2011 0600   TRIG 115 06/07/2011 0600   HDL 61 06/07/2011 0600   CHOLHDL 3.3 06/07/2011 0600   VLDL 23 06/07/2011 0600   LDLCALC 115* 06/07/2011 0600    CXR: normal and  Myoview nuclear medicine:  1.  No fixed or reversible defects.   2.  Calculated ejection fraction of 60%.   3.  Radiotracer uptake in the left breast.  Mammography is   recommended.  This could be associated with uptake within breast   carcinoma.  Treatments: IV hydration and insulin: Humalog and Lantus. Pt received Flu Shot.  Discharge Exam: There were no vitals taken for this visit. No pertinent findings  Disposition: Home or Self Care;   F/U Recommendations: Referrals for Neurosurgery (C3-C4 disk protrusion), ENT (BPPV per PT recs), Sleep study,  Mammogram. Follow up for any additional chest pain. Pt does need closer BP management as well.   Discharge Orders    Future Appointments: Provider: Department: Dept Phone: Center:   06/12/2011 11:00 AM Kristine Boyer [facility] [phone] [facility]   06/30/2011 11:45 AM Kristine Baas, MD [facility] [phone] [facility]     Medications unchanged  PCP: Kristine Boyer Dictation #: [id number]   Signed: Mikel Cella, AMBER 06/09/2011, 4:28 PM

## 2011-06-10 ENCOUNTER — Other Ambulatory Visit: Payer: Self-pay | Admitting: Family Medicine

## 2011-06-10 ENCOUNTER — Ambulatory Visit: Payer: Medicare HMO | Admitting: Physical Therapy

## 2011-06-10 DIAGNOSIS — I10 Essential (primary) hypertension: Secondary | ICD-10-CM

## 2011-06-10 NOTE — Telephone Encounter (Signed)
Received refill request for Diovan  and was approved , however it is now noted that  diovan and diovan /HCT are on med list.  Called pharmacy and asked to hold off on filling this med now . Will check with MD first.

## 2011-06-11 NOTE — H&P (Signed)
NAME:  Kristine Boyer, Kristine Boyer NO.:  [id number]  MEDICAL RECORD NO.:  [id number]  LOCATION:  [facility]                         FACILITY:  [facility]  PHYSICIAN:  Edsel Petrin, D.O.DATE OF BIRTH:  1941-06-10  DATE OF ADMISSION:  06/06/2011 DATE OF DISCHARGE:                             HISTORY & PHYSICAL   PRIMARY CARE PHYSICIAN:  Shelly Flatten, MD, from [facility].  CHIEF COMPLAINT:  Chest pain.  HISTORY OF PRESENT ILLNESS:  The patient is 70 year old female with past medical history of type 2 diabetes, hypertension, and hypothyroidism who comes to the emergency room with left-sided chest pain.  She states that she was upset about her grand baby swallowing coins and began having left-sided chest pain.  She suspects her pain is thrusting, 10/10, associated with shortness of breath and palpitations, but denies any nausea, vomiting, or diaphoresis associated with it.  The exact duration of chest pain was not known, but probably lasted between 30 minutes to 1 hour.  Her chest pain got better with aspirin and nitroglycerin that was given to her in the EMS.  She also mentions that she had the urge to go to the bathroom, but she ended urinating in her underpants and felt quite embarrassed.  The patient's daughter at the bedside reported that she passed out, but the patient could not recall that.  The patient states that even if she lost consciousness, it was very transient, probably lasting for few seconds.  By the time we evaluated her, she was completely pain free.  CURRENT OUTPATIENT MEDICATIONS: 1. Aspirin 81 mg p.o. daily. 2. Calcium carbonate 600 mg 2 tablets by mouth daily. 3. Insulin 60 units at bedtime. 4. Ketoprofen 20% gel apply to affected area for 4 times a day. 5. Synthroid 175 mcg 1 tablet by mouth daily. 6. Metoprolol 50 mg take 1 tablet by mouth daily. 7. Multivitamin 1 tablet daily. 8 . Protonix 40 mg 1 tablet by mouth daily. 1.  Zocor 40 mg 1 tablet by mouth daily. 2. Losartan and hydrochlorothiazide combination 160-25 one tablet by     mouth daily.  The patient is allergic to [facility].  PAST MEDICAL HISTORY: 1. Hypertension. 2. Diabetes. 3. Hypothyroidism. 4. Osteoarthritis. 5. Migraines. 6. BPPV.  PAST SURGICAL HISTORY:  The patient had an abnormal hysterectomy, tonsillectomy, adenoidectomy, bilateral myringotomy and tubes.  FAMILY HISTORY:  Significant for diabetes in mother.  SOCIAL HISTORY:  The patient's health care power of attorney and emergency contact is her daughter, Thea Gist whose telephone number is [phone].  The patient lives with daughter, granddaughter, and great granddaughter and a housemate.  The patient currently does not follow a diabetic diet plan.  She reports eating sugary food often.  The patient does not have a current exercise plan.  The patient is a former smoker, but quit in 1987.  The patient drinks about 1 drink per week. The patient denies recreational drug use.  REVIEW OF SYSTEMS:  Pertinent items noted in the HPI, rest 14-point review of systems was negative.  PHYSICAL EXAMINATION:  VITAL SIGNS: Temperature 98.1, pulse 76, blood pressure 189/102 which corrected to 181/92 in the ED, respiratory rate 17, and O2  saturation 100% on room air.  Vital signs reviewed and noted. GENERAL:  Well developed and well nourished, in no acute distress, alert, appropriate, and cooperative throughout examination. HEAD:  Normocephalic and atraumatic. EYES:  Pupils are equal, round, and reactive to light.  Extraocular movements intact.  No signs of anemia or jaundice. EARS:  TMs nonerythematous, non-bulging, good light reflex bilaterally. NOSE:  Mucous membranes moist, not inflamed, nonerythematous. THROAT:  Oropharynx nonerythematous.  No exudates appreciated. NECK:  No deformities, masses, or tenderness noted.  Supple.  No carotid bruits.  No JVD. LUNGS:  Clear to auscultation  bilaterally.  Normal respiratory effort. No crackles or wheezing heard. HEART:  Respiratory rate normal.  S1 and S2 normal without gallops, murmurs, or rubs. ABDOMEN:  Bowel sounds normoactive, soft, nondistended, nontender.  No masses or organomegaly. EXTREMITIES:  No pretibial edema. NEUROLOGIC:  Alert and oriented x3.  Cranial nerves II-XII are grossly intact.  Motor strength is 5/5 in all 4 extremities, sensation is intact to light touch, cerebellar signs negative.  No visible rashes or scars.  LABORATORY RESULTS:  WBCs 10.3, hemoglobin 12, and platelets 199. Sodium 145, potassium 3.1, chloride 105, bicarb 33, BUN 13, creatinine 1.02, glucose 176, and calcium 9.1.  AST 17, ALT 11, alkaline phosphatase 96, total bilirubin 0.2, protein 7.4, and albumin 3.5. Total CK 190, CK-MB 2.4, and troponin was 0.30.  IMAGING RESULTS:  Chest x-ray, no acute cardiopulmonary process seen.  ASSESSMENT AND PLAN:  This is a 70 year old female with past medical history of poorly controlled diabetes and hypertension who comes in today with chest pain. 1. Chest pain.  According to the story, the pain is typical of angina     and there is a definite concern for acute coronary syndrome,     although since pain is currently resolved with minimal meds, no EKG     changes seen, and troponins are negative x1, suspicion has come     down.  We will admit the patient for observation and cycle cardiac     enzymes 2 more times.  The patient may benefit from a 2-D echo for     any new wall motion abnormalities and any structural heart defect     since the patient complained of dizziness at the time of pain.     Other causes of chest pain could be musculoskeletal,     gastroesophageal, especially since the patient has GERD and     costochondritis.  The patient will be continued on aspirin,     metoprolol, morphine p.r.n. for pain, oxygen as needed, and     sublingual nitroglycerin for pain.  The patient is already  on     statin and we will obtain fasting lipid panel for risk     stratification.  We will monitor electrolytes.  The patient was     hyperkalemic in the emergency department which is going to be     repleted. 2. Hypertension.  Continue home meds at this time.  We will start     hydrochlorothiazide and metoprolol. 3. Diabetes type 2.  Continue home Lantus along with sliding-scale     insulin.  Last HbA1c 10.1 in office. 4. Hyperlipidemia.  Continue Zocor. 5. Gastroesophageal reflux disease.  Continue proton pump inhibitors. 6. Deep vein thrombosis prophylaxis.  Lovenox subcu.     Lars Mage, MD   ______________________________ Edsel Petrin, D.O.    AG/MEDQ  D:  06/07/2011  T:  06/07/2011  Job:  [id number]  Electronically Signed by Lars Mage  on 06/07/2011 07:57:54 AM Electronically Signed by Anderson Malta D.O. on 06/11/2011 03:55:46 PM

## 2011-06-12 ENCOUNTER — Telehealth: Payer: Self-pay | Admitting: *Deleted

## 2011-06-12 ENCOUNTER — Other Ambulatory Visit: Payer: Self-pay | Admitting: Family Medicine

## 2011-06-12 ENCOUNTER — Ambulatory Visit (INDEPENDENT_AMBULATORY_CARE_PROVIDER_SITE_OTHER): Payer: Medicare HMO | Admitting: Family Medicine

## 2011-06-12 ENCOUNTER — Encounter: Payer: Self-pay | Admitting: Family Medicine

## 2011-06-12 VITALS — BP 154/95 | HR 101 | Temp 98.1°F | Wt 214.3 lb

## 2011-06-12 DIAGNOSIS — G4733 Obstructive sleep apnea (adult) (pediatric): Secondary | ICD-10-CM

## 2011-06-12 DIAGNOSIS — N63 Unspecified lump in unspecified breast: Secondary | ICD-10-CM

## 2011-06-12 DIAGNOSIS — N6019 Diffuse cystic mastopathy of unspecified breast: Secondary | ICD-10-CM | POA: Insufficient documentation

## 2011-06-12 DIAGNOSIS — I10 Essential (primary) hypertension: Secondary | ICD-10-CM

## 2011-06-12 DIAGNOSIS — IMO0002 Reserved for concepts with insufficient information to code with codable children: Secondary | ICD-10-CM

## 2011-06-12 DIAGNOSIS — E119 Type 2 diabetes mellitus without complications: Secondary | ICD-10-CM

## 2011-06-12 DIAGNOSIS — H8110 Benign paroxysmal vertigo, unspecified ear: Secondary | ICD-10-CM

## 2011-06-12 LAB — BASIC METABOLIC PANEL
CO2: 31 mEq/L (ref 19–32)
Calcium: 9.1 mg/dL (ref 8.4–10.5)
Sodium: 143 mEq/L (ref 135–145)

## 2011-06-12 MED ORDER — VALSARTAN 160 MG PO TABS: 160.0000 mg | ORAL_TABLET | Freq: Every day | ORAL | Status: DC

## 2011-06-12 MED ORDER — INSULIN GLARGINE 100 UNIT/ML ~~LOC~~ SOLN: SUBCUTANEOUS | Status: DC

## 2011-06-12 NOTE — Assessment & Plan Note (Signed)
Per hospital discharge, pt was noted to have increased tracer uptake during cardiolite in left breast.  Today we scheduled a mammogram to evaluate for breast CA.

## 2011-06-12 NOTE — Assessment & Plan Note (Signed)
Per patient, her physical therapist believes she needs an ENT eval for BPPV that is not getting better with PT.  I will put that in today.

## 2011-06-12 NOTE — Patient Instructions (Addendum)
Today we scheduled your mammogram next Friday at 8:50am at the breast center We will order your sleep study, appts with neurosurgery and ENT, and your physical therapy--you should be called about them  I sent in your diovan for you blood pressure Please come back to see your new doctor, Dr. Konrad Dolores in 2-3 weeks.

## 2011-06-12 NOTE — Assessment & Plan Note (Addendum)
Pt with longstanding back pain and MRI in 2006 showing severe disc protrusion at C3C4.  Pt desires neurosurgery evaluation.  Will put in referral today

## 2011-06-12 NOTE — Assessment & Plan Note (Signed)
Pt doing well on 50units lantus in AM, 20units qhs.  Continue regimen

## 2011-06-12 NOTE — Progress Notes (Signed)
  Subjective:    Patient ID: Kristine Boyer, female    DOB: [birth year], 70 y.o.   MRN: [id number]  HPI Here for hospital f/u.  No SOB or CP since admission.  In good spirits.  Has not had lantus or diovan since hospitalization.  Has multiple referral requests to f/u hospitalization.  Back pain- chronic pain in back without weakness.  Pt desires neurosurgery eval to f/u protruding discs in C3-C4 shown on MRI 2006.  during her recent CP admission, her cardiologist felt this may contribute to her pain and wanted her to be evaluated.   BPPV-  Pt sees a PT for this, she states her PT thinks she is not getting better and needs ENT evaluation.  Mammogram-  cardiolite showed an area of increased tracer uptake in left breast.  Pt endorses some changes in that breast.  Order was sent from hospital for diagnostic mammo.  OSA- pt was noted to have apneic events during sleep at hospital, her inpt team recommends sleep study to evaluate.   Review of Systems Denies CP, SOB, HA, N/V/D, fever     Objective:   Physical Exam  Vital signs reviewed General appearance - alert, well appearing, and in no distress  Chest - clear to auscultation, no wheezes, rales or rhonchi, symmetric air entry, no tachypnea, retractions or cyanosis Heart - normal rate, regular rhythm, normal S1, S2, no murmurs, rubs, clicks or gallops Abdomen - soft, nontender, nondistended, no masses or organomegaly        Assessment & Plan:  BPPV (benign paroxysmal positional vertigo) Per patient, her physical therapist believes she needs an ENT eval for BPPV that is not getting better with PT.  I will put that in today.  Breast mass in female Per hospital discharge, pt was noted to have increased tracer uptake during cardiolite in left breast.  Today we scheduled a mammogram to evaluate for breast CA.  Bulging disc Pt with longstanding back pain and MRI in 2006 showing severe disc protrusion at C3C4.  Pt desires neurosurgery  evaluation.  Will put in referral today  DIABETES MELLITUS II, UNCOMPLICATED Pt doing well on 50units lantus in AM, 20units qhs.  Continue regimen  HYPERTENSION, BENIGN SYSTEMIC Has not been on diovan.  Will restart today.  Pt to see Dr. Konrad Dolores soon for f/u BMET today since K was low in hospital  OSA (obstructive sleep apnea) Pt with apneic episodes in hospital.  Will send for sleep study for eval.

## 2011-06-12 NOTE — Telephone Encounter (Signed)
Referral completed and placed in MDs box for signature.Milas Gain, Maryjo Rochester

## 2011-06-12 NOTE — Assessment & Plan Note (Signed)
Has not been on diovan.  Will restart today.  Pt to see Dr. Konrad Dolores soon for f/u BMET today since K was low in hospital

## 2011-06-12 NOTE — Assessment & Plan Note (Signed)
Pt with apneic episodes in hospital.  Will send for sleep study for eval.

## 2011-06-19 ENCOUNTER — Ambulatory Visit
Admission: RE | Admit: 2011-06-19 | Discharge: 2011-06-19 | Disposition: A | Discharge status: Home / Self Care | Payer: Medicare HMO | Source: Ambulatory Visit | Attending: Family Medicine | Admitting: Family Medicine

## 2011-06-19 DIAGNOSIS — N63 Unspecified lump in unspecified breast: Secondary | ICD-10-CM

## 2011-06-25 NOTE — Discharge Summary (Signed)
NAME:  Kristine Boyer, Kristine Boyer NO.:  [id number]  MEDICAL RECORD NO.:  [id number]  LOCATION:  [facility]                         FACILITY:  [facility]  PHYSICIAN:  Paula Compton, MD        DATE OF BIRTH:  1941-02-13  DATE OF ADMISSION:  06/06/2011 DATE OF DISCHARGE:  06/09/2011                              DISCHARGE SUMMARY   PRIMARY CARE PHYSICIAN:  Shelly Flatten, MD, at [facility].  REASON FOR ADMISSION:  Chest pain.  SECONDARY DIAGNOSES: 1. Hypothyroidism. 2. Diabetes. 3. Hyperlipidemia. 4. Obesity. 5. Hypertension. 6. Gastroesophageal reflux disease. 7. Back pain. 8. Benign paroxysmal positional vertigo.  DISCHARGE DIAGNOSES: 1. Atypical chest pain. 2. C3-C4 severe disk protrusion. 3. Hypertension. 4. Hypothyroidism. 5. Vertigo.  DISCHARGE CONDITION:  Stable medical condition.  DISCHARGE COURSE:  The patient is a 70 year old female with significant past medical history who was admitted for new onset of chest pain.  1. Chest pain.  Chest pain occurred in the setting of stress.  Pain     resolved in the emergency department with one nitro and aspirin.     Cardiac enzymes are negative and echo was unremarkable.  Dr. Donnie Aho     with Cardiology was consulted to just see the patient.  He also     felt that this is atypical chest pain brought on my stress,     flashing anxiety in an acute event.  He ordered a Myoview, which     was normal with the heart.  She did have questionable uptake in her     breast.  The patient was discharged home with sublingual nitro as     needed and aspirin, but would recommend follow up with mammogram     for nuclear tracer uptake. 2. Hypertension, uncontrolled.  Blood pressure was much higher on     admission and throughout her hospital stay, the patient did require     an hydrochlorothiazide while in the hospital with blood pressures     consistently in the 140s.  The patient will need further outpatient     management  of hypertension. 3. Disk disease.  Dr. Donnie Aho states that this should be addressed as a     possible cause of her pain in her shoulder/chest.  The patient has     see Neurosurgery in the past, but no time recently.  There were no     episodes during this hospital stay, but the primary care physician     thinks that she would benefit from a further evaluation.  We would     recommend scheduling her with the Neurosurgery as an outpatient     (last MRI was in 2006). 4. Diabetes, controlled as an inpatient, was on Lantus and sliding     scale insulin, received home regimen at discharge. 5. BPPV.  The patient sees Annabelle Harman at [facility] for vestibular physical     therapy, which she states helped but Annabelle Harman her to be referred for an     ENT appointment.  The patient states therapist is sending a letter     to Dr. Konrad Dolores in the [facility] office with  further     recommendation.  The patient will resume physical therapy next     week. 6. Sleep apnea.  Daughter noted multiple apneic episodes during her     hospitalization.  The patient also has difficult to control of     hypertension so it is thought that a sleep study will be beneficial     for her.  This was not arranged while she was in the hospital.  CONSULTS:  Cardiology, Dr. Donnie Aho.  SIGNIFICANT DIAGNOSTIC STUDIES AND LABORATORY DATA:  The patient's potassium was 3.1 on admission and was repleted, was 3.5 on day of discharge.  Cardiac panel negative x3.  Chest x-ray was normal on admission and Myoview nuclear medicine showed no fixed or reversible defect with the calculated ejection fraction of 60%.  There was radiotracer uptake in the left breast and, therefore, mammography was recommended as this could be associated with breast carcinoma.  IMMUNIZATIONS:  The patient received a flu shot.  FOLLOWUP RECOMMENDATIONS:  Referrals for neurosurgery, ENT, sleep study, and mammography.  Please follow up for any additional chest  pain episode.  The patient does need closer blood pressure management control as well.  HOME MEDICATIONS:  Unchanged.  CONTINUE THE FOLLOWING MEDICATIONS: 1. Valsartan 160 mg p.o. daily. 2. Valium one tablet 2 mg t.i.d. p.r.n. 3. Simvastatin 40 mg one tablet q.a.m. 4. Lantus dose 20-40 units q.a.m. 5. Multivitamin. 6. Nitroglycerin sublingual 0.4 mg one tablet as needed. 7. Tramadol 50 mg one tablet q.6 h. p.r.n. 8. Aspirin 325 mg p.r.n. 9. Protonix 40 mg one tablet q.a.m. 10.Calcium daily. 11.Albuterol inhaler as needed. 12.Synthroid 175 mcg tablet daily.  FOLLOWUP APPOINTMENT:  Dr. Ellery Plunk at [facility] on June 12, 2011, at 11 a.m.    ______________________________ Kristine Pickle, MD   ______________________________ Paula Compton, MD    AH/MEDQ  D:  06/09/2011  T:  06/09/2011  Job:  [id number]  Electronically Signed by Kristine Boyer  on 06/15/2011 12:55:02 AM Electronically Signed by Paula Compton MD on 06/25/2011 03:02:09 PM

## 2011-06-29 ENCOUNTER — Ambulatory Visit (INDEPENDENT_AMBULATORY_CARE_PROVIDER_SITE_OTHER): Payer: Medicare HMO | Admitting: Sports Medicine

## 2011-06-29 ENCOUNTER — Ambulatory Visit (INDEPENDENT_AMBULATORY_CARE_PROVIDER_SITE_OTHER): Payer: Medicare HMO | Admitting: Family Medicine

## 2011-06-29 ENCOUNTER — Encounter: Payer: Self-pay | Admitting: Family Medicine

## 2011-06-29 ENCOUNTER — Encounter: Payer: Self-pay | Admitting: Sports Medicine

## 2011-06-29 VITALS — BP 156/85 | HR 75

## 2011-06-29 VITALS — BP 182/108 | HR 84 | Temp 98.2°F | Wt 213.0 lb

## 2011-06-29 DIAGNOSIS — E119 Type 2 diabetes mellitus without complications: Secondary | ICD-10-CM

## 2011-06-29 DIAGNOSIS — H8110 Benign paroxysmal vertigo, unspecified ear: Secondary | ICD-10-CM

## 2011-06-29 DIAGNOSIS — R300 Dysuria: Secondary | ICD-10-CM

## 2011-06-29 DIAGNOSIS — N63 Unspecified lump in unspecified breast: Secondary | ICD-10-CM

## 2011-06-29 DIAGNOSIS — M545 Low back pain: Secondary | ICD-10-CM

## 2011-06-29 DIAGNOSIS — M654 Radial styloid tenosynovitis [de Quervain]: Secondary | ICD-10-CM

## 2011-06-29 DIAGNOSIS — G4733 Obstructive sleep apnea (adult) (pediatric): Secondary | ICD-10-CM

## 2011-06-29 DIAGNOSIS — IMO0002 Reserved for concepts with insufficient information to code with codable children: Secondary | ICD-10-CM

## 2011-06-29 DIAGNOSIS — I10 Essential (primary) hypertension: Secondary | ICD-10-CM

## 2011-06-29 LAB — POCT URINALYSIS DIPSTICK
Glucose, UA: NEGATIVE
Leukocytes, UA: NEGATIVE
Nitrite, UA: NEGATIVE
Urobilinogen, UA: 0.2

## 2011-06-29 LAB — POCT UA - MICROSCOPIC ONLY

## 2011-06-29 LAB — POCT GLYCOSYLATED HEMOGLOBIN (HGB A1C): Hemoglobin A1C: 7.2

## 2011-06-29 MED ORDER — METFORMIN HCL 500 MG PO TABS: 500.0000 mg | ORAL_TABLET | Freq: Two times a day (BID) | ORAL | Status: DC

## 2011-06-29 MED ORDER — SIMVASTATIN 40 MG PO TABS: 40.0000 mg | ORAL_TABLET | Freq: Every day | ORAL | Status: DC

## 2011-06-29 MED ORDER — PANTOPRAZOLE SODIUM 40 MG PO TBEC: 40.0000 mg | DELAYED_RELEASE_TABLET | Freq: Every day | ORAL | Status: DC

## 2011-06-29 NOTE — Assessment & Plan Note (Signed)
She continues to improve slowly  Keep using the thumb spica splint for much of the day  She is given a softer thumb loop splint to try and we will gradually shift her to that   Start some gentle motion exercises and ball squeezes in warm water  Recheck in 2-3 month

## 2011-06-29 NOTE — Patient Instructions (Signed)
Please work on range of motion of hand and wrist in warm water  You may squeeze a soft ball in the warm water to help increase your motion  Start alternating using the longer wrist brace with the thumb loop brace- and as your  wrist feels better you may try using the thumb loop when having discomfort.  Please follow up with Dr. Darrick Penna in 2-3 months

## 2011-06-29 NOTE — Patient Instructions (Addendum)
Thank you for coming into the office today.  I am reassured that you are making good progress.  I will follow up on getting you in to see someone for your back pain and an ENT doctor for your dizziness.   I am starting you on Metformin 500mg  today for added diabetes control. If you start having diarrhea or an upset stomach please call the office and stop the medication I would like for you to come in for a blood pressure check at your earilest convenience. Please take your blood pressure medication the morning you come in. I would like to see you back in the office in 3 months to reassess how things are going. Please feel free to make an appointment if you feel you need to be seen sooner.

## 2011-06-29 NOTE — Progress Notes (Signed)
  Subjective:    Patient ID: Kristine Boyer, female    DOB: 1941/07/17, 70 y.o.   MRN: [id number]  HPI  Patient presents to clinic for f/u of rt De Quervain's syndrome which she reports is about 50% improved. Still has pain with thumb extension, also with activities such as doing hair and dishes.  Using thumb spica wrist splint during the day.  Taking tramadol as needed for pain at bedtime.  Using OTC ointment which seems to help as opposed to RX for top NSAID    Review of Systems     Objective:   Physical Exam  NAD Lt wrist exam: Has less swelling over 1st compartment Stiff but has full extension on lt greater than rt, flexion limited on lt  Ulnar and radial deviation normal  Grip strength better Minimal pain on finkelstein Some pain w resisted thumb extension left      Assessment & Plan:

## 2011-06-30 ENCOUNTER — Ambulatory Visit (HOSPITAL_BASED_OUTPATIENT_CLINIC_OR_DEPARTMENT_OTHER): Payer: Medicare HMO | Attending: Family Medicine

## 2011-06-30 ENCOUNTER — Ambulatory Visit: Payer: Medicare HMO | Admitting: Sports Medicine

## 2011-06-30 DIAGNOSIS — G4733 Obstructive sleep apnea (adult) (pediatric): Secondary | ICD-10-CM | POA: Insufficient documentation

## 2011-07-01 ENCOUNTER — Telehealth: Payer: Self-pay | Admitting: Family Medicine

## 2011-07-01 NOTE — Progress Notes (Signed)
Addended by: Konrad Dolores, Kimetha Trulson J on: 07/01/2011 04:41 PM   Modules accepted: Orders, SmartSet

## 2011-07-01 NOTE — Assessment & Plan Note (Signed)
Mammography reassuring. Will follow up w/ regular breast exams and yearly mammography.

## 2011-07-01 NOTE — Assessment & Plan Note (Signed)
Has appt to see ENT. Will follow up w/ recommendations.

## 2011-07-01 NOTE — Assessment & Plan Note (Signed)
Improvement w/ sports medicine therapy, bracing, and steroid injection. Pt advised that skin overlying injection site will likely gain back coloration w/ time. Will follow for now.
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
## 2011-07-01 NOTE — Assessment & Plan Note (Signed)
Pt going for sleep study tomorrow. Will f/u w/ results

## 2011-07-01 NOTE — Assessment & Plan Note (Signed)
HTN today likely due to fact that pt did not take her medications and we feeling anxious. Pt reports feeling better since starting Diovan. Will continue current therapy as unsure if actually working. I do not want to increase her therapy until I have an accurate measure of where her pressure is. Pt to come in for BP check at her convenience. Instructed to take medication in the morning prior to coming in and to remain relaxed when she comes in.

## 2011-07-01 NOTE — Assessment & Plan Note (Signed)
Well controlled on Lantus 50Qam and 20QHS. Will add Metformin 500 Qday for one week then BID thereafter. Would like to see A1C below 7 adn above 6.5. No signs of hypoglycemia at home on current regimen. GI intolerance in past w/ glipizide so will follow closely for similar adverse reaction to Metformin.

## 2011-07-01 NOTE — Telephone Encounter (Signed)
Left a message. I would like to see pt back in 1 month for f/u w/ back pain, HTN, and Breast exam

## 2011-07-01 NOTE — Progress Notes (Addendum)
  Subjective:    Patient ID: Kristine Boyer, female    DOB: 05-16-1941, 70 y.o.   MRN: [id number]  HPI  QI:[id number] today to 182/108. Did not take BP medications this AM, and feeling anxious today. Started on diovan at last appointment adn pt feels like this is making a difference. Denies, any CP, syncope, SOB, palpitations.   DM: Well controlled on Lantus 50units Qam and 20 units Qhs. BS at home in the 130-150s typically w/ occasional drops to the low 100's. Most recent Hgb A1c 7.2.  Denies any vision change, change in sensation in the hands or toes, or N/V/D. Has tried glipizide in the past but had to stop due to GI upset.   Breast: recent mammography on 9/21 reassuring. No change in breast tissue.   Back pain: Neurosurgeon unable to see pt due to insurance. Continues to have back pain requiring tramadol.  L DeQuervain sinuvitis: being [facility]rick [facility]na [facility]. Pain is improved today. Steroid shot, PT, and brace has reduced the pain by ~50%.   Pt going for sleep study on 06/30/11  Review of Systems See HPI    Objective:   Physical Exam  Gen: NAD, obese HEENT: MMM, multiple missing teeth and cavities, no oral lesions, PERRL,  CV: RRR, no m/r/g Res: CTAB, normal effort Abd: NABS, soft non-tender Skin: intact, no rashes, hypopigmentation of skin over steroid injection site w/ some proximal tracking. No foot ulceration.  Minor callus formation over 5th toes on each foot.  Ext: Normal ROM\ Neuro: sensation intact Bilaterally in feet and hands. Normal gate.    06/19/11 Mammography : "No mass or malignant type calcifications are seen in the right breast. No mammographic evidence of malignancy. Recommend screening mammography in 1 year. BI-RADS CATEGORY 2: Benign finding(s)."      Assessment & Plan:

## 2011-07-01 NOTE — Assessment & Plan Note (Addendum)
Unable to see neurosurgery due to insurance. Will try to set up pt w/ ortho. Will continue Tramadol for now for pain.  (h/o  MRI in 2006 showing severe disc protrusion at [facility])

## 2011-07-03 ENCOUNTER — Ambulatory Visit (INDEPENDENT_AMBULATORY_CARE_PROVIDER_SITE_OTHER): Payer: Medicare HMO | Admitting: *Deleted

## 2011-07-03 VITALS — BP 156/96 | HR 88

## 2011-07-03 DIAGNOSIS — I10 Essential (primary) hypertension: Secondary | ICD-10-CM

## 2011-07-03 NOTE — Progress Notes (Signed)
In for BP check. She did take meds this AM . However she has just gotten a little upset because she went for appointment that had been scheduled with ENT and was told they do not accept her [employer]. She has made appointment with another doctor. She rested a bit and feels calmer .  BP  RA 150 /90 and LA 156 96 pulse 88. Dr. Konrad Dolores notified and he advises for patient to follow up with him in one month.

## 2011-07-06 ENCOUNTER — Telehealth: Payer: Self-pay | Admitting: Family Medicine

## 2011-07-06 NOTE — Telephone Encounter (Signed)
Pt is asking about referrals - status

## 2011-07-06 NOTE — Telephone Encounter (Signed)
Per pt she changed the appt to tomorrow with Dr. Suszanne Conners @ the [facility].  Will forward to MD for Berenice Primas, Maryjo Rochester

## 2011-07-07 ENCOUNTER — Other Ambulatory Visit: Payer: Self-pay | Admitting: Orthopedic Surgery

## 2011-07-07 DIAGNOSIS — M545 Low back pain: Secondary | ICD-10-CM

## 2011-07-07 DIAGNOSIS — M542 Cervicalgia: Secondary | ICD-10-CM

## 2011-07-11 ENCOUNTER — Ambulatory Visit
Admission: RE | Admit: 2011-07-11 | Discharge: 2011-07-11 | Disposition: A | Discharge status: Home / Self Care | Payer: Medicare HMO | Source: Ambulatory Visit | Attending: Orthopedic Surgery | Admitting: Orthopedic Surgery

## 2011-07-11 DIAGNOSIS — M542 Cervicalgia: Secondary | ICD-10-CM

## 2011-07-11 DIAGNOSIS — R0989 Other specified symptoms and signs involving the circulatory and respiratory systems: Secondary | ICD-10-CM

## 2011-07-11 DIAGNOSIS — M545 Low back pain: Secondary | ICD-10-CM

## 2011-07-11 DIAGNOSIS — R0609 Other forms of dyspnea: Secondary | ICD-10-CM

## 2011-07-11 DIAGNOSIS — G4733 Obstructive sleep apnea (adult) (pediatric): Secondary | ICD-10-CM

## 2011-07-11 NOTE — Procedures (Signed)
NAME:  Kristine Boyer, Kristine Boyer NO.:  [id number]  MEDICAL RECORD NO.:  [id number]          PATIENT TYPE:  OUT  LOCATION:  SLEEP CENTER                 FACILITY:  [facility]  PHYSICIAN:  Clinton D. Maple Hudson, MD, FCCP, FACPDATE OF BIRTH:  11/08/40  DATE OF STUDY:  06/30/2011                           NOCTURNAL POLYSOMNOGRAM  REFERRING PHYSICIAN:  Wayne A. Sheffield Slider, M.D.  REFERRING PHYSICIAN:  Wayne A. Sheffield Slider, MD  INDICATION FOR STUDY:  Hypersomnia with sleep apnea.  EPWORTH SLEEPINESS SCORE:  17/24.  BMI 38.1, weight 215 pounds, height 63 inches and neck 16 inches.  MEDICATIONS:  Home medications are charted and reviewed.  SLEEP ARCHITECTURE:  Split study protocol.  During the diagnostic phase, total sleep time 180 minutes with sleep efficiency 76.9%.  Stage I was 18.3% stage II, 81.7%, stages III and REM were absent.  Sleep latency 2.5 minutes, awake after sleep onset 52 minutes.  Arousal index 36.7.  BEDTIME MEDICATION:  None.  RESPIRATORY DATA:  Split study protocol.  Apnea-hypopnea index (AHI) 41 per hour.  A total of 123 events was scored including 32 obstructive apneas and 91 hypopneas.  Events were not positional.  CPAP was then titrated to 18 CWP, AHI 3.5 per hour.  She wore a small ResMed Quattro full face mask with heated humidifier and AC-flex setting of 3.  OXYGEN DATA:  Moderately loud snoring before CPAP with oxygen desaturation to a nadir of 80% on room air.  With CPAP titration, mean oxygen saturation held 94% on room air, and snoring was prevented.  CARDIAC DATA:  Normal sinus rhythm.  MOVEMENT-PARASOMNIA:  No significant movement disturbance.  Bathroom x3.  IMPRESSIONS-RECOMMENDATIONS: 1. Severe obstructive sleep apnea/hypopnea syndrome, AHI 41 per hour     with moderately loud snoring and oxygen desaturation to a nadir of     80%. 2. Successful CPAP titration to 18 CWP, AHI 3.5 per hour.  She wore a     small ResMed Quattro full face mask with heated  humidifier and C-     flex setting of 3.     Clinton D. Maple Hudson, MD, [facility], FACP Diplomate, Biomedical engineer of Sleep Medicine Electronically Signed    CDY/MEDQ  D:  07/11/2011 14:14:43  T:  07/11/2011 14:37:38  Job:  [id number]

## 2011-07-15 ENCOUNTER — Emergency Department (HOSPITAL_COMMUNITY)
Admission: EM | Admit: 2011-07-15 | Discharge: 2011-07-15 | Disposition: A | Discharge status: Home / Self Care | Payer: Medicare HMO | Attending: Emergency Medicine | Admitting: Emergency Medicine

## 2011-07-15 ENCOUNTER — Emergency Department (HOSPITAL_COMMUNITY): Payer: Medicare HMO

## 2011-07-15 DIAGNOSIS — S0003XA Contusion of scalp, initial encounter: Secondary | ICD-10-CM | POA: Insufficient documentation

## 2011-07-15 DIAGNOSIS — I2510 Atherosclerotic heart disease of native coronary artery without angina pectoris: Secondary | ICD-10-CM | POA: Insufficient documentation

## 2011-07-15 DIAGNOSIS — J984 Other disorders of lung: Secondary | ICD-10-CM | POA: Insufficient documentation

## 2011-07-15 DIAGNOSIS — E039 Hypothyroidism, unspecified: Secondary | ICD-10-CM | POA: Insufficient documentation

## 2011-07-15 DIAGNOSIS — S1093XA Contusion of unspecified part of neck, initial encounter: Secondary | ICD-10-CM | POA: Insufficient documentation

## 2011-07-15 DIAGNOSIS — I10 Essential (primary) hypertension: Secondary | ICD-10-CM | POA: Insufficient documentation

## 2011-07-15 DIAGNOSIS — W108XXA Fall (on) (from) other stairs and steps, initial encounter: Secondary | ICD-10-CM | POA: Insufficient documentation

## 2011-07-15 DIAGNOSIS — E119 Type 2 diabetes mellitus without complications: Secondary | ICD-10-CM | POA: Insufficient documentation

## 2011-07-15 LAB — URINALYSIS, ROUTINE W REFLEX MICROSCOPIC
Bilirubin Urine: NEGATIVE
Ketones, ur: NEGATIVE mg/dL
Nitrite: NEGATIVE
Specific Gravity, Urine: 1.017 (ref 1.005–1.030)
Urobilinogen, UA: 0.2 mg/dL (ref 0.0–1.0)

## 2011-07-15 LAB — URINE MICROSCOPIC-ADD ON

## 2011-07-27 ENCOUNTER — Telehealth: Payer: Self-pay | Admitting: Family Medicine

## 2011-07-27 ENCOUNTER — Emergency Department (HOSPITAL_COMMUNITY): Payer: Medicare HMO

## 2011-07-27 ENCOUNTER — Encounter (HOSPITAL_COMMUNITY): Payer: Self-pay

## 2011-07-27 ENCOUNTER — Emergency Department (HOSPITAL_COMMUNITY)
Admission: EM | Admit: 2011-07-27 | Discharge: 2011-07-27 | Disposition: A | Discharge status: Home / Self Care | Payer: Medicare HMO | Attending: Emergency Medicine | Admitting: Emergency Medicine

## 2011-07-27 DIAGNOSIS — Z79899 Other long term (current) drug therapy: Secondary | ICD-10-CM | POA: Insufficient documentation

## 2011-07-27 DIAGNOSIS — R42 Dizziness and giddiness: Secondary | ICD-10-CM | POA: Insufficient documentation

## 2011-07-27 DIAGNOSIS — E119 Type 2 diabetes mellitus without complications: Secondary | ICD-10-CM | POA: Insufficient documentation

## 2011-07-27 DIAGNOSIS — I2510 Atherosclerotic heart disease of native coronary artery without angina pectoris: Secondary | ICD-10-CM | POA: Insufficient documentation

## 2011-07-27 DIAGNOSIS — M51379 Other intervertebral disc degeneration, lumbosacral region without mention of lumbar back pain or lower extremity pain: Secondary | ICD-10-CM | POA: Insufficient documentation

## 2011-07-27 DIAGNOSIS — E039 Hypothyroidism, unspecified: Secondary | ICD-10-CM | POA: Insufficient documentation

## 2011-07-27 DIAGNOSIS — M5137 Other intervertebral disc degeneration, lumbosacral region: Secondary | ICD-10-CM | POA: Insufficient documentation

## 2011-07-27 DIAGNOSIS — I10 Essential (primary) hypertension: Secondary | ICD-10-CM | POA: Insufficient documentation

## 2011-07-27 DIAGNOSIS — G43909 Migraine, unspecified, not intractable, without status migrainosus: Secondary | ICD-10-CM | POA: Insufficient documentation

## 2011-07-27 LAB — URINALYSIS, ROUTINE W REFLEX MICROSCOPIC
Bilirubin Urine: NEGATIVE
Hgb urine dipstick: NEGATIVE
Ketones, ur: NEGATIVE mg/dL
Specific Gravity, Urine: 1.014 (ref 1.005–1.030)
pH: 6.5 (ref 5.0–8.0)

## 2011-07-27 NOTE — Telephone Encounter (Signed)
Pt called emergency line and asked if she could speak to Dr. Konrad Dolores.  I explained to pt that he was not on call tonight and asked if I could help her with something.  She stated she was paralyzed on one side and needed to talk with her MD.  I asked pt for more information but she again stated she needed to talk with Dr. Konrad Dolores.  I told her that if she was paralyzed on one side that she would need to call 911 and come to the ED for evaluation.  Pt became upset that she could not speak to Dr. Konrad Dolores and hung up.

## 2011-07-28 LAB — URINE CULTURE: Culture  Setup Time: 201210290901

## 2011-08-05 ENCOUNTER — Encounter: Payer: Self-pay | Admitting: Family Medicine

## 2011-08-05 ENCOUNTER — Ambulatory Visit (INDEPENDENT_AMBULATORY_CARE_PROVIDER_SITE_OTHER): Payer: Medicare HMO | Admitting: Family Medicine

## 2011-08-05 VITALS — BP 168/98 | HR 84 | Ht 62.5 in | Wt 216.0 lb

## 2011-08-05 DIAGNOSIS — G4733 Obstructive sleep apnea (adult) (pediatric): Secondary | ICD-10-CM

## 2011-08-05 DIAGNOSIS — I10 Essential (primary) hypertension: Secondary | ICD-10-CM

## 2011-08-05 DIAGNOSIS — R918 Other nonspecific abnormal finding of lung field: Secondary | ICD-10-CM

## 2011-08-05 DIAGNOSIS — M4800 Spinal stenosis, site unspecified: Secondary | ICD-10-CM | POA: Insufficient documentation

## 2011-08-05 DIAGNOSIS — R222 Localized swelling, mass and lump, trunk: Secondary | ICD-10-CM

## 2011-08-05 LAB — BASIC METABOLIC PANEL
Calcium: 8.5 mg/dL (ref 8.4–10.5)
Creat: 1.16 mg/dL — ABNORMAL HIGH (ref 0.50–1.10)

## 2011-08-05 MED ORDER — TRAMADOL HCL 50 MG PO TABS: 50.0000 mg | ORAL_TABLET | Freq: Two times a day (BID) | ORAL | Status: DC | PRN

## 2011-08-05 NOTE — Assessment & Plan Note (Signed)
Patient states that she did have some study done here in recently and they recommended that she had a BiPAP machine. Unfortunately I do not see this in the chart anywhere and will need to discuss further with Dr. Margot Ables if reasonable would order for patient

## 2011-08-05 NOTE — Assessment & Plan Note (Signed)
Patient is still elevated at this time is in pain and very anxious. Would consider changing her medication goal weight and she follows up. At that time I would consider increasing her metoprolol 100 mg if her heart rate can take it

## 2011-08-05 NOTE — Assessment & Plan Note (Signed)
Small size of a concern with patient's clinical impressions and history of possible coughing blood as well as the supraclavicular lymph node. When asked patient has a very long history of cancers in the family as well including lung cancer in a great aunt. At this time feel that it would be reasonable to get a CT of the chest done. This has been ordered and will be done on Friday of this week. Patient and family will return a week later with either primary care provider or myself to discuss treatment from here on.

## 2011-08-05 NOTE — Assessment & Plan Note (Signed)
Patient is being followed by [facility] and has been referred to [facility] orth[facility]ics for the potential of epidural injections which I think is a good sign. Encourage patient to try many other conservative therapies before any surgical intervention is necessary. Gave patient red flags and what to look out for.

## 2011-08-05 NOTE — Patient Instructions (Signed)
It is very good to meet you all. I am ordering a CT scan to check with this lung nodule is. Please return to see either myself or Dr. Konrad Dolores within a week after your CT scan of your chest. At bedtime I would really like to talk to you about your blood pressure medications as well
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
## 2011-08-07 ENCOUNTER — Ambulatory Visit (HOSPITAL_COMMUNITY)
Admission: RE | Admit: 2011-08-07 | Discharge: 2011-08-07 | Disposition: A | Discharge status: Home / Self Care | Payer: Medicare HMO | Source: Ambulatory Visit | Attending: Family Medicine | Admitting: Family Medicine

## 2011-08-07 DIAGNOSIS — R918 Other nonspecific abnormal finding of lung field: Secondary | ICD-10-CM

## 2011-08-07 DIAGNOSIS — J984 Other disorders of lung: Secondary | ICD-10-CM | POA: Insufficient documentation

## 2011-08-07 MED ORDER — IOHEXOL 300 MG/ML  SOLN
80.0000 mL | Freq: Once | INTRAMUSCULAR | Status: AC | PRN
  Administered 2011-08-07: 80 mL via INTRAVENOUS

## 2011-09-03 ENCOUNTER — Other Ambulatory Visit: Payer: Self-pay | Admitting: Family Medicine

## 2011-09-03 DIAGNOSIS — E039 Hypothyroidism, unspecified: Secondary | ICD-10-CM

## 2011-10-13 ENCOUNTER — Encounter: Payer: Self-pay | Admitting: Family Medicine

## 2011-10-13 ENCOUNTER — Ambulatory Visit (INDEPENDENT_AMBULATORY_CARE_PROVIDER_SITE_OTHER): Payer: Medicare HMO | Admitting: Family Medicine

## 2011-10-13 DIAGNOSIS — G4733 Obstructive sleep apnea (adult) (pediatric): Secondary | ICD-10-CM

## 2011-10-13 DIAGNOSIS — R222 Localized swelling, mass and lump, trunk: Secondary | ICD-10-CM

## 2011-10-13 DIAGNOSIS — R918 Other nonspecific abnormal finding of lung field: Secondary | ICD-10-CM

## 2011-10-13 DIAGNOSIS — Z23 Encounter for immunization: Secondary | ICD-10-CM

## 2011-10-13 DIAGNOSIS — E119 Type 2 diabetes mellitus without complications: Secondary | ICD-10-CM

## 2011-10-13 DIAGNOSIS — I10 Essential (primary) hypertension: Secondary | ICD-10-CM

## 2011-10-13 DIAGNOSIS — H6090 Unspecified otitis externa, unspecified ear: Secondary | ICD-10-CM

## 2011-10-13 DIAGNOSIS — E039 Hypothyroidism, unspecified: Secondary | ICD-10-CM

## 2011-10-13 DIAGNOSIS — H60399 Other infective otitis externa, unspecified ear: Secondary | ICD-10-CM

## 2011-10-13 DIAGNOSIS — S0990XA Unspecified injury of head, initial encounter: Secondary | ICD-10-CM

## 2011-10-13 LAB — POCT GLYCOSYLATED HEMOGLOBIN (HGB A1C): Hemoglobin A1C: 7.4

## 2011-10-13 LAB — TSH: TSH: 2.031 u[IU]/mL (ref 0.350–4.500)

## 2011-10-13 NOTE — Patient Instructions (Addendum)
Thank you for coming into clinic today. I think you are doing very well. I will refill your medications and send them to the pharmacy. Please come back to see me in 2 weeks and we will discuss your lab results and follow-up with some of your other medical problems. Please call me with any concerns.  Please have your sleep study results sent over to our office.   Have a great day.

## 2011-10-14 DIAGNOSIS — S0990XA Unspecified injury of head, initial encounter: Secondary | ICD-10-CM | POA: Insufficient documentation

## 2011-10-14 MED ORDER — CALCIUM CARBONATE 600 MG PO TABS: 600.0000 mg | ORAL_TABLET | Freq: Two times a day (BID) | ORAL | Status: DC

## 2011-10-14 MED ORDER — VALSARTAN 160 MG PO TABS: 160.0000 mg | ORAL_TABLET | Freq: Every day | ORAL | Status: DC

## 2011-10-14 MED ORDER — TRAMADOL HCL 50 MG PO TABS: 50.0000 mg | ORAL_TABLET | Freq: Two times a day (BID) | ORAL | Status: DC | PRN

## 2011-10-14 MED ORDER — LEVOTHYROXINE SODIUM 175 MCG PO TABS: 175.0000 ug | ORAL_TABLET | Freq: Every day | ORAL | Status: DC

## 2011-10-14 MED ORDER — METOPROLOL SUCCINATE ER 50 MG PO TB24: 50.0000 mg | ORAL_TABLET | Freq: Every day | ORAL | Status: DC

## 2011-10-14 MED ORDER — PANTOPRAZOLE SODIUM 40 MG PO TBEC: 40.0000 mg | DELAYED_RELEASE_TABLET | Freq: Every day | ORAL | Status: DC

## 2011-10-14 MED ORDER — ONE-DAILY MULTI VITAMINS PO TABS: 1.0000 | ORAL_TABLET | Freq: Every day | ORAL | Status: DC

## 2011-10-14 MED ORDER — ASPIRIN EC 81 MG PO TBEC: 81.0000 mg | DELAYED_RELEASE_TABLET | Freq: Every day | ORAL | Status: DC

## 2011-10-14 MED ORDER — SIMVASTATIN 40 MG PO TABS: 40.0000 mg | ORAL_TABLET | Freq: Every day | ORAL | Status: DC
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
## 2011-10-14 NOTE — Assessment & Plan Note (Signed)
Hypertensive today but did not take BP meds. Pt has been out of meds for past month or so. Will refill meds today.

## 2011-10-14 NOTE — Assessment & Plan Note (Addendum)
No neurological deficits. Current HA unlikely related. Likely related to elevated BP. Fall and balance issues likely musculoskeletal. Low concern for ICH. No imaging at this time. Pt to continue NSAID's for HA. Warning signs of intracranial process and red flags discussed w/ pt.

## 2011-10-14 NOTE — Assessment & Plan Note (Signed)
Thyroid levels have not been rechecked since August. When starting thyroid replacement. Will recheck today. Asymptomatic w/ regard to hyper or hypothyroidism

## 2011-10-14 NOTE — Assessment & Plan Note (Signed)
Sleep study reviewed from back in 06/2011. Pt will need home CPAP. Will start setting this up.

## 2011-10-14 NOTE — Assessment & Plan Note (Signed)
Hgb A1C today. Took Metformin off med list as pt unable to tolerate due to GI symptoms. Continue daily Lantus.

## 2011-10-14 NOTE — Assessment & Plan Note (Signed)
CT scan from 08/07/11 reviewed w/ pt. Will plan to rescan in 3months time from last scan (~11/06/10). Risk factors of smoking and age but w/o fever, weight loss, lymphadenopathy, or respiratory symptoms.

## 2011-10-26 ENCOUNTER — Telehealth: Payer: Self-pay | Admitting: Family Medicine

## 2011-10-26 DIAGNOSIS — G4733 Obstructive sleep apnea (adult) (pediatric): Secondary | ICD-10-CM

## 2011-10-26 NOTE — Assessment & Plan Note (Signed)
Impression from Sleep study:  "Severe OSA/hypopnea syndrome. AHI41 per hour w/ moderately loud snoring and oxygen desaturation to a nadir of 80%" Successful CPAP titration to 18 CWP, AHI 3.5 per hour. She wore a small ResMed Quattro full face mask w/ heated humidifier and C-flex setting of 3."

## 2011-10-26 NOTE — Telephone Encounter (Signed)
Spoke to Pt by phone. Discussed sleep study results. Pt has not been contacted regarding home CPAP. Will order [facility] today. Pt to see me in clinic later this week.

## 2011-10-28 ENCOUNTER — Other Ambulatory Visit: Payer: Self-pay | Admitting: Family Medicine

## 2011-10-28 DIAGNOSIS — G4733 Obstructive sleep apnea (adult) (pediatric): Secondary | ICD-10-CM

## 2011-10-29 ENCOUNTER — Ambulatory Visit: Payer: Medicare HMO | Admitting: Family Medicine

## 2011-11-06 ENCOUNTER — Ambulatory Visit: Payer: Medicare HMO | Admitting: Family Medicine

## 2011-11-23 ENCOUNTER — Telehealth: Payer: Self-pay | Admitting: Family Medicine

## 2011-11-23 NOTE — Telephone Encounter (Signed)
Spoke with patient, she reports that she has a terrible head cold with congestion and thinks she needs an antibiotic. Patient has an appointment tomorrow with Dr. Hulen Luster and will try to schedule another appointment with Dr. Konrad Dolores after that. FYI to MD.

## 2011-11-23 NOTE — Telephone Encounter (Signed)
Patient wants to speak to Dr. Konrad Dolores about her cold symptoms.

## 2011-11-24 ENCOUNTER — Ambulatory Visit (INDEPENDENT_AMBULATORY_CARE_PROVIDER_SITE_OTHER): Payer: Medicare HMO | Admitting: Family Medicine

## 2011-11-24 ENCOUNTER — Encounter: Payer: Self-pay | Admitting: Family Medicine

## 2011-11-24 VITALS — BP 177/103 | HR 86 | Temp 98.1°F | Ht 62.5 in | Wt 213.0 lb

## 2011-11-24 DIAGNOSIS — J069 Acute upper respiratory infection, unspecified: Secondary | ICD-10-CM | POA: Insufficient documentation

## 2011-11-24 DIAGNOSIS — L304 Erythema intertrigo: Secondary | ICD-10-CM

## 2011-11-24 DIAGNOSIS — I10 Essential (primary) hypertension: Secondary | ICD-10-CM

## 2011-11-24 DIAGNOSIS — L538 Other specified erythematous conditions: Secondary | ICD-10-CM

## 2011-11-24 MED ORDER — NYSTATIN 100000 UNIT/GM EX POWD: Freq: Four times a day (QID) | CUTANEOUS | Status: DC

## 2011-11-24 NOTE — Progress Notes (Signed)
  Subjective:    Patient ID: Kristine Boyer, female    DOB: December 21, 1940, 71 y.o.   MRN: [id number]  HPI  Patient with 3 weeks of cough, congestion, sore throat. She is initially sick for a week and that is getting better. She has a grandchild that lives at home with her dentist reexposed acute illness. Now her throat is sore and she has a lot of nasal congestion. She denies any sinus pain, fever, headache. No wheezing. No cough currently. She feels like she is losing her voice is hard for her to talk loudly.  Hypertension-she has not taken her medicines today. She's not taken of the last few days because her throat hurts. She does think that she can swallow the pills and she is willing to take them again. She denies dizziness currently though she does have a history of vertigo. She denies chest pain, shortness of breath  Intertrigo-patient with a redness and skin irritation underneath both breasts. She's not wearing a bra today. She says that the bra irritates his further. She was on a powder for this but she does not remember the name over a year ago. She says the daughter helped her.  Patient lives with a granddaughter. She is not a smoker. Review of Systems See above    Objective:   Physical Exam  Vital signs reviewed General appearance - alert, well appearing, and in no distress and oriented to person, place, and time Ears - bilateral TM's and external ear canals normal, right ear normal, left ear normal Nose - yellow discharge present. No pain with percussion of sinuses. Throat-mild erythema, no discharge, no petechiae Lungs-clear to auscultation without rhonchi or wheeze Heart - normal rate, regular rhythm, normal S1, S2, no murmurs, rubs, clicks or gallops Skin-there is a semi- circle area beneath both breasts of redness, irritation       Assessment & Plan:

## 2011-11-24 NOTE — Patient Instructions (Signed)
Please take Mucinex 1200 mg extended-release twice a day or the equal regular release dose Confirm with your pharmacist that the medicine you take is good for your blood pressure Make sure you take your blood pressure medicines every day Take Tylenol for sore throat Drink lots of warm fluids Come back in one week for recheck and blood pressure check

## 2011-11-24 NOTE — Assessment & Plan Note (Signed)
Patient is not take her medicines this morning. Followup in one week for blood pressure check.

## 2011-11-24 NOTE — Assessment & Plan Note (Signed)
Under breasts. sHe did well on nystatin powder the past. Prescribe this again.

## 2011-11-24 NOTE — Assessment & Plan Note (Signed)
Recommended Mucinex, Tylenol, symptomatic treatment. No sign of wheezing, consolidation lung exam. Patient is feeling better today. Followup in one week for recheck and blood pressure check.

## 2011-12-02 ENCOUNTER — Ambulatory Visit (INDEPENDENT_AMBULATORY_CARE_PROVIDER_SITE_OTHER): Payer: Medicare HMO | Admitting: Family Medicine

## 2011-12-02 ENCOUNTER — Encounter: Payer: Self-pay | Admitting: Family Medicine

## 2011-12-02 DIAGNOSIS — E039 Hypothyroidism, unspecified: Secondary | ICD-10-CM

## 2011-12-02 DIAGNOSIS — E119 Type 2 diabetes mellitus without complications: Secondary | ICD-10-CM

## 2011-12-02 DIAGNOSIS — I10 Essential (primary) hypertension: Secondary | ICD-10-CM

## 2011-12-02 DIAGNOSIS — J069 Acute upper respiratory infection, unspecified: Secondary | ICD-10-CM

## 2011-12-02 MED ORDER — PANTOPRAZOLE SODIUM 40 MG PO TBEC: 40.0000 mg | DELAYED_RELEASE_TABLET | Freq: Every day | ORAL | Status: DC

## 2011-12-02 MED ORDER — LEVOTHYROXINE SODIUM 175 MCG PO TABS: 175.0000 ug | ORAL_TABLET | Freq: Every day | ORAL | Status: DC

## 2011-12-02 MED ORDER — SIMVASTATIN 40 MG PO TABS: 40.0000 mg | ORAL_TABLET | Freq: Every day | ORAL | Status: DC

## 2011-12-02 MED ORDER — INSULIN GLARGINE 100 UNIT/ML ~~LOC~~ SOLN: SUBCUTANEOUS | Status: DC

## 2011-12-02 MED ORDER — METOPROLOL SUCCINATE ER 50 MG PO TB24: 50.0000 mg | ORAL_TABLET | Freq: Every day | ORAL | Status: DC

## 2011-12-02 MED ORDER — VALSARTAN 160 MG PO TABS: 160.0000 mg | ORAL_TABLET | Freq: Every day | ORAL | Status: DC

## 2011-12-02 NOTE — Progress Notes (Signed)
  Subjective:    Patient ID: Genella Mech, female    DOB: 27-Nov-1940, 71 y.o.   MRN: [id number]  HPI  URI f/u-  Feeling better, still hoarse.  No fevers, chills.  Still with some postnasal drip, better with mucinex.  HTN- has taken meds this week but is now out.  Did not take this AM.  No dizziness, HA, SOB, CP.  Review of Systems See above    Objective:   Physical Exam Vital signs reviewed General appearance - alert, well appearing, and in no distress and oriented to person, place, and time Heart - normal rate, regular rhythm, normal S1, S2, no murmurs, rubs, clicks or gallops Chest - clear to auscultation, no wheezes, rales or rhonchi, symmetric air entry, no tachypnea, retractions or cyanosis        Assessment & Plan:

## 2011-12-02 NOTE — Assessment & Plan Note (Signed)
Did not take her meds this AM because she ran out.  Has been taking them this week.  BP improved.  F/u with PCP.  Refilled all meds today. BP Readings from Last 3 Encounters:  12/02/11 162/82  11/24/11 177/103  10/13/11 178/95

## 2011-12-02 NOTE — Assessment & Plan Note (Signed)
Improved.  Continue mucinex, hot fluids.  Rest voice

## 2011-12-02 NOTE — Patient Instructions (Signed)
I am glad you are feeling better Make sure you get your medicines and take daily Call us with concerns

## 2011-12-07 ENCOUNTER — Ambulatory Visit (INDEPENDENT_AMBULATORY_CARE_PROVIDER_SITE_OTHER): Payer: Medicare HMO | Admitting: Family Medicine

## 2011-12-07 ENCOUNTER — Encounter: Payer: Self-pay | Admitting: Family Medicine

## 2011-12-07 ENCOUNTER — Telehealth: Payer: Self-pay | Admitting: *Deleted

## 2011-12-07 VITALS — BP 142/88 | HR 74 | Temp 97.7°F | Ht 62.5 in | Wt 210.5 lb

## 2011-12-07 DIAGNOSIS — R918 Other nonspecific abnormal finding of lung field: Secondary | ICD-10-CM

## 2011-12-07 DIAGNOSIS — E119 Type 2 diabetes mellitus without complications: Secondary | ICD-10-CM

## 2011-12-07 DIAGNOSIS — G4733 Obstructive sleep apnea (adult) (pediatric): Secondary | ICD-10-CM

## 2011-12-07 DIAGNOSIS — E785 Hyperlipidemia, unspecified: Secondary | ICD-10-CM

## 2011-12-07 DIAGNOSIS — R222 Localized swelling, mass and lump, trunk: Secondary | ICD-10-CM

## 2011-12-07 DIAGNOSIS — I5022 Chronic systolic (congestive) heart failure: Secondary | ICD-10-CM

## 2011-12-07 DIAGNOSIS — I10 Essential (primary) hypertension: Secondary | ICD-10-CM

## 2011-12-07 NOTE — Patient Instructions (Signed)
You are doing fantastic.  Keep up the healthy diet and exercising when possible. Your overall health will continue to improve. Please call the hospital to schedule your CT scan. I will call you with the results if they are abnormal. Please come back to see me in 3 months or as needed. I will look into your order for your CPAP.   Keep up the great work and God bless.

## 2011-12-07 NOTE — Progress Notes (Signed)
Called [facility] and was told that they do not accept [employer] HMO. I called [facility] and was told that if pt has [employer] HMO that they cannot accept the orders and that I would need to call [facility]. Called [facility] and they do accept [employer] HMO and that I could fax pt's information to [phone]..Maicee Ullman [place] 

## 2011-12-07 NOTE — Telephone Encounter (Signed)
Called [facility] and was told that they do not accept [facility] HMO. I called [facility] and was told that if pt has [employer] HMO that they cannot accept the orders and that I would need to call [facility]. Called [facility] and they do accept [facility] HMO and that I could fax pt's information to [phone].Laureen Ochs, Viann Shove

## 2011-12-08 LAB — BASIC METABOLIC PANEL
Calcium: 9.1 mg/dL (ref 8.4–10.5)
Creat: 0.9 mg/dL (ref 0.50–1.10)

## 2011-12-08 LAB — LDL CHOLESTEROL, DIRECT: Direct LDL: 80 mg/dL

## 2011-12-09 NOTE — Assessment & Plan Note (Signed)
Has not had cholesterol checked since 05/2011. Reports change in diet to include fewer fatty foods, more vegetables, and more exercise. On zocor 20mg . Tolerating statin. Direct LDL today

## 2011-12-09 NOTE — Assessment & Plan Note (Addendum)
BS improving w/ diet exercise and on Lantus regimen of 50 units Qam and 20 units QHS. Pt not sure of previous metformin dose. Pt to bring in home metformin at next appt. Hgb A1c as above.

## 2011-12-09 NOTE — Assessment & Plan Note (Signed)
No resp symptoms. No unintentional wt loss or night sweats. No SOB or hemoptysis. Repeat CT w/ contrast to evaluate change

## 2011-12-09 NOTE — Assessment & Plan Note (Signed)
Reviewed previous records and Echo. Not sure of why pt has dx. Will address further at next visit.

## 2011-12-09 NOTE — Assessment & Plan Note (Signed)
Will ask help of support staff to assist w/ calls and paperwork to obtain CPAP for pt as pt w/o information regarding contacts for CPAP (lost number). This is important for pts health.

## 2011-12-09 NOTE — Assessment & Plan Note (Addendum)
No s/s hypotension or extreme HTN. BP still not at goal today in clinic (SBP <130). Some discrepency between recorded BP meds. Pt to bring in Rx at next appt. Continue current regimen of toprol-xl and valsartan. Not sure of why pt on ACEi vs ARB. Will address changing therapy at next appt when pt brings in home medications.

## 2011-12-09 NOTE — Progress Notes (Signed)
  Subjective:    Patient ID: Kristine Boyer, female    DOB: [birth year], 71 y.o.   MRN: [id number]  HPI CC: URI f/u, DM control  URI: Pt continues to take mucinex w/ relief of cough. Denies fever and SOB. Voice has returned. Slow improvement per pt.    DM: Home BS of <150 over the past several wks. Denies s/s of hypo/hyperglycemia. Reports GI intolerance to metformin including significant upset stomach and diffuse diarrhea. Not sure of home dose. Pt to bring in metformin at next appt. Denies change in sensation/vision. Pt is exercising more and is eating more vegetables and less fatty/calorie laden foods.   HTN: Improving diet and exercise as above. Wt dwn 4lb today. Reports taking toprol-xl and Diovan HCTZ. Only toprol-xl and diovan in chart. Pt to bring in BP meds at next appt. BP initially 178/98 upon initially coming into clinic. Rechecked after resting for a few minutes at 142/88. Goal SBP of <130. Denies HA, CP, palpitations, SOB, syncope, lightheadedness, orthostasis.  OSA: Reports not being able to get CPAP b/c durable goods provider would not accept insurance. Pt w/o contact for other providers. Continues to experience daytime sleepiness. No knowledge of night snoring or night time awaking. No morning HA reported.  HLD: exercise and diet as above. Pt reports feeling much better since changing diet and esercising.   Lung mass: Denies unintentional wt loss, night sweats, hemoptosis, SOB, bruising/LE swelling or pain. Reviewed previous CT w/ pt and explained importance of repeat study. No FMHx of lung ca. Former smoker  Intertrigo: Significantly improved since starting nystatin powder. Still using nystatin daily. Denies irritation, erythema, discharge.    Review of Systems See HPI w/ the following additions.  Negative: n/v/d/c, hematochezia, hematuria, dysuria, skin irritation.     Objective:   Physical Exam  CV: RRR no m/r/g Res; CTAB, normal effort.    Diabetic foot exam  performed and w/o loss of sensation and w/o blisters/skin breakdown.   Lab Results  Component Value Date   HGBA1C 7.4 10/13/2011         Assessment & Plan:

## 2011-12-11 ENCOUNTER — Telehealth: Payer: Self-pay | Admitting: Family Medicine

## 2011-12-11 ENCOUNTER — Ambulatory Visit (HOSPITAL_COMMUNITY)
Admission: RE | Admit: 2011-12-11 | Discharge: 2011-12-11 | Disposition: A | Discharge status: Home / Self Care | Payer: Medicare HMO | Source: Ambulatory Visit | Attending: Family Medicine | Admitting: Family Medicine

## 2011-12-11 DIAGNOSIS — R918 Other nonspecific abnormal finding of lung field: Secondary | ICD-10-CM

## 2011-12-11 DIAGNOSIS — J984 Other disorders of lung: Secondary | ICD-10-CM | POA: Insufficient documentation

## 2011-12-11 DIAGNOSIS — Z09 Encounter for follow-up examination after completed treatment for conditions other than malignant neoplasm: Secondary | ICD-10-CM | POA: Insufficient documentation

## 2011-12-11 MED ORDER — IOHEXOL 300 MG/ML  SOLN
80.0000 mL | Freq: Once | INTRAMUSCULAR | Status: AC | PRN
  Administered 2011-12-11: 80 mL via INTRAVENOUS

## 2011-12-11 NOTE — Telephone Encounter (Signed)
Patient is returning the call to who ever it was who called her.

## 2011-12-11 NOTE — Telephone Encounter (Signed)
Kristine Boyer our office to day, to say they were cancelling the patient prescription for them. I advised them this was not Dr. Ernst Spell patient. They said they would review patients chart, to see if wrong provider was listed

## 2011-12-11 NOTE — Telephone Encounter (Signed)
Called pt and lvm to inform her that [name] has made several attempts to call her to get more information regarding the order for her CPAP. I said that it imperative that she call our office so that we can get her what she needs.Kristine Boyer [place]

## 2011-12-11 NOTE — Telephone Encounter (Signed)
Informing Dr Konrad Dolores they are cancelling the Cpap orders due to not being able to reach patient - they have called several times and have gone out to see her, but not been able to reach her.

## 2011-12-11 NOTE — Telephone Encounter (Signed)
Kristine Boyer,  Is this the pt you were working on?

## 2011-12-14 ENCOUNTER — Encounter: Payer: Self-pay | Admitting: Gastroenterology

## 2011-12-15 NOTE — Telephone Encounter (Signed)
Called pt stated that her phone needed to be recharged and that she will speak with someone at [facility].Laureen Ochs, Viann Shove

## 2011-12-17 ENCOUNTER — Encounter: Payer: Self-pay | Admitting: Family Medicine

## 2011-12-17 ENCOUNTER — Encounter (INDEPENDENT_AMBULATORY_CARE_PROVIDER_SITE_OTHER): Payer: Medicare HMO | Admitting: Family Medicine

## 2011-12-17 VITALS — BP 142/100 | HR 79 | Temp 98.7°F | Ht 62.6 in | Wt 211.0 lb

## 2011-12-17 DIAGNOSIS — R918 Other nonspecific abnormal finding of lung field: Secondary | ICD-10-CM

## 2011-12-17 DIAGNOSIS — J309 Allergic rhinitis, unspecified: Secondary | ICD-10-CM

## 2011-12-17 DIAGNOSIS — G500 Trigeminal neuralgia: Secondary | ICD-10-CM

## 2011-12-17 DIAGNOSIS — N6019 Diffuse cystic mastopathy of unspecified breast: Secondary | ICD-10-CM

## 2011-12-17 DIAGNOSIS — D1779 Benign lipomatous neoplasm of other sites: Secondary | ICD-10-CM

## 2011-12-17 DIAGNOSIS — R222 Localized swelling, mass and lump, trunk: Secondary | ICD-10-CM

## 2011-12-17 DIAGNOSIS — M654 Radial styloid tenosynovitis [de Quervain]: Secondary | ICD-10-CM

## 2011-12-17 DIAGNOSIS — I10 Essential (primary) hypertension: Secondary | ICD-10-CM

## 2011-12-17 DIAGNOSIS — IMO0002 Reserved for concepts with insufficient information to code with codable children: Secondary | ICD-10-CM

## 2011-12-17 DIAGNOSIS — E876 Hypokalemia: Secondary | ICD-10-CM

## 2011-12-17 NOTE — Patient Instructions (Signed)
Thank you for coming into clinic today. Keep up the great work. No changes today. I will contact you if we are going to make any changes to your medications. Please come back to see me if you feel like we need to address anything in particular. Have a blessed day.

## 2011-12-20 NOTE — Progress Notes (Signed)
  Subjective:    Patient ID: Kristine Boyer, female    DOB: 10-Sep-1941, 71 y.o.   MRN: [id number]  HPI CC: follow up CT for lung mass, facial pain, Upper chest mass  Lung Mass: reviewed CT film w/ pt. Denies SOB, unexplained weight loss, night sweats, fevers, easy bruising, hematemesis. Previous smoker.  Facial pain: Since last appointment, pt complaining of several episodes of R sided facial pain. Pain is sharp and stabbing in nature and last only for a few seconds. Pain comes from side of face near the ear and travels along lower jaw and cheek to mid face, and from above her eye on the R. Has not tried anything to alleviate the pain. Previous fall and hit back of head w/o loss of consciousness. No TMJ pain or temporal pain. Denies HA, vision change.   Chest mass: soft subq mass that has slowly grown for 47yrs. Occasionally painful on palpation. Evaluated previously by surgeon who said needed to be rescected.   Hyperlipidemia: Reviewed direct LDL results with patient. Continues to exercise and improved diet.   Hypokalemia: Patient reports taking potassium pills at home if feeling cramps. Patient reports eating a lot of bananas.   Review of Systems Negative: Chest pain, shortness of breath, nausea, vomiting, diarrhea, constipation, headache, hematochezia, hematemesis, fever, night sweats, weight loss    Objective:   Physical Exam   BP 142/100  Pulse 79  Temp(Src) 98.7 F (37.1 C) (Oral)  Ht 5' 2.6" (1.59 m)  Wt 211 lb (95.709 kg)  BMI 37.86 kg/m2  HEENT: No TMJ tenderness or facial tenderness.  Musc: 4x2cm raised soft mobile non-painful mass of R upper chest.  Skin: Continues to have hypopigmented skin at injection site of steroid of L wrist       Assessment & Plan:   This encounter was created in error - please disregard.

## 2011-12-21 DIAGNOSIS — G500 Trigeminal neuralgia: Secondary | ICD-10-CM | POA: Insufficient documentation

## 2011-12-21 DIAGNOSIS — D1779 Benign lipomatous neoplasm of other sites: Secondary | ICD-10-CM | POA: Insufficient documentation

## 2011-12-21 MED ORDER — HYDROCHLOROTHIAZIDE 25 MG PO TABS: 12.5000 mg | ORAL_TABLET | Freq: Every day | ORAL | Status: DC

## 2011-12-21 NOTE — Assessment & Plan Note (Signed)
Will repeat CT in 9-12 months or sooner if becomes symptomatic

## 2011-12-21 NOTE — Assessment & Plan Note (Signed)
Pt not on HCTZ and BP not at goal. Will start on low dose. Counseled on side effects and s/s of hypotension.

## 2011-12-21 NOTE — Assessment & Plan Note (Signed)
Likely lipoma. Pt not interested in surgery or steroid injections at this time. No further intervention. Discussed lipoma progression and risks benefits of treatment options.

## 2011-12-21 NOTE — Assessment & Plan Note (Signed)
Likely trigeminal neuralgia. No further intervention warranted at this time. Will likely be self limited.

## 2011-12-27 ENCOUNTER — Emergency Department (HOSPITAL_COMMUNITY): Payer: Medicare HMO

## 2011-12-27 ENCOUNTER — Emergency Department (HOSPITAL_COMMUNITY)
Admission: EM | Admit: 2011-12-27 | Discharge: 2011-12-27 | Disposition: A | Discharge status: Home / Self Care | Payer: Medicare HMO | Attending: Emergency Medicine | Admitting: Emergency Medicine

## 2011-12-27 ENCOUNTER — Encounter (HOSPITAL_COMMUNITY): Payer: Self-pay | Admitting: *Deleted

## 2011-12-27 DIAGNOSIS — J069 Acute upper respiratory infection, unspecified: Secondary | ICD-10-CM

## 2011-12-27 DIAGNOSIS — J029 Acute pharyngitis, unspecified: Secondary | ICD-10-CM | POA: Insufficient documentation

## 2011-12-27 DIAGNOSIS — R509 Fever, unspecified: Secondary | ICD-10-CM | POA: Insufficient documentation

## 2011-12-27 DIAGNOSIS — R05 Cough: Secondary | ICD-10-CM | POA: Insufficient documentation

## 2011-12-27 DIAGNOSIS — Z794 Long term (current) use of insulin: Secondary | ICD-10-CM | POA: Insufficient documentation

## 2011-12-27 DIAGNOSIS — R059 Cough, unspecified: Secondary | ICD-10-CM | POA: Insufficient documentation

## 2011-12-27 DIAGNOSIS — M47812 Spondylosis without myelopathy or radiculopathy, cervical region: Secondary | ICD-10-CM | POA: Insufficient documentation

## 2011-12-27 DIAGNOSIS — R109 Unspecified abdominal pain: Secondary | ICD-10-CM | POA: Insufficient documentation

## 2011-12-27 DIAGNOSIS — E119 Type 2 diabetes mellitus without complications: Secondary | ICD-10-CM | POA: Insufficient documentation

## 2011-12-27 HISTORY — DX: Immunodeficiency, unspecified: D84.9

## 2011-12-27 LAB — RAPID STREP SCREEN (MED CTR MEBANE ONLY): Streptococcus, Group A Screen (Direct): NEGATIVE

## 2011-12-27 MED ORDER — TRAMADOL HCL 50 MG PO TABS: 50.0000 mg | ORAL_TABLET | Freq: Four times a day (QID) | ORAL | Status: AC | PRN

## 2011-12-27 MED ORDER — TRAMADOL HCL 50 MG PO TABS
50.0000 mg | ORAL_TABLET | Freq: Once | ORAL | Status: AC
  Administered 2011-12-27: 50 mg via ORAL
  Filled 2011-12-27: qty 1

## 2011-12-27 NOTE — ED Notes (Signed)
The pt has had a sorethroat for 3-4 days and she has a cold.  No idea if she has a temp

## 2011-12-27 NOTE — ED Provider Notes (Signed)
Medical screening examination/treatment/procedure(s) were performed by non-physician practitioner and as supervising physician I was immediately available for consultation/collaboration.  Teia Freitas, MD 12/27/11 2358 

## 2011-12-27 NOTE — ED Notes (Signed)
Patient reports throat pain, different than a sore throat x 3 days states hurts to swallow and has a productive cough. Rates pain 10/10

## 2011-12-27 NOTE — Discharge Instructions (Signed)
Please review the instructions below. The x-ray of your chest and the soft tissues in your neck tonight were both normal. You do not have pneumonia or any abnormal swelling in the soft tissue structures in your neck. The likely source of your sore throat is postnasal drainage associated with your recent upper respiratory infection. You may want to try an over-the-counter anti-allergy medicine such as Claritin or Zyrtec for the next month or so to see if this resolves. Consult with your pharmacist regarding which anti-allergy medicine is appropriate for someone with high blood pressure. We have also provided a short course of medication for pain for your sore throat. You may also want to try salt water gargles or over the counter Chloraseptic spray. If your symptoms persist you will need to arrange follow up with your primary care physician at [facility]. Return for worsening symptoms otherwise follow up as discussed.    Sore Throat A sore throat is felt inside the throat and at the back of the mouth. It hurts to swallow or the throat may feel dry and scratchy. It can be caused by germs, smoking, pollution, or allergies.  HOME CARE   Only take medicine as told by your doctor.   Drink enough fluids to keep your pee (urine) clear or pale yellow.   Eat soft foods.   Do not smoke.   Rinse the mouth (gargle) with warm water or salt water ( teaspoon salt in 8 ounces of water).   Try throat sprays, lozenges, or suck on hard candy.  GET HELP RIGHT AWAY IF:   You have trouble breathing.   Your sore throat lasts longer than 1 week.   There is more puffiness (swelling) in the throat.   The pain is so bad that you are unable to swallow.   You have a very bad headache or a red rash.   You start to throw up (vomit).   You or your child has a temperature by mouth above 102 F (38.9 C), not controlled by medicine.   Your baby is older than 3 months with a rectal temperature of 102 F  (38.9 C) or higher.   Your baby is 64 months old or younger with a rectal temperature of 100.4 F (38 C) or higher.  MAKE SURE YOU:   Understand these instructions.   Will watch your condition.   Will get help right away if you are not doing well or get worse.  Document Released: 06/23/2008 Document Revised: 09/03/2011 Document Reviewed: 06/23/2008 .Salt Water Gargle This solution will help make your mouth and throat feel better. HOME CARE INSTRUCTIONS   Mix 1 teaspoon of salt in 8 ounces of warm water.   Gargle with this solution as much or often as you need or as directed. Swish and gargle gently if you have any sores or wounds in your mouth.   Do not swallow this mixture.  Document Released: 06/18/2004 Document Revised: 09/03/2011 Document Reviewed: 11/09/2008 [facility] Patient Information 2012 [place], [place].Upper Respiratory Infection, Adult An upper respiratory infection (URI) is also sometimes known as the common cold. The upper respiratory tract includes the nose, sinuses, throat, trachea, and bronchi. Bronchi are the airways leading to the lungs. Most people improve within 1 week, but symptoms can last up to 2 weeks. A residual cough may last even longer.  CAUSES Many different viruses can infect the tissues lining the upper respiratory tract. The tissues become irritated and inflamed and often become very moist. Mucus production  is also common. A cold is contagious. You can easily spread the virus to others by oral contact. This includes kissing, sharing a glass, coughing, or sneezing. Touching your mouth or nose and then touching a surface, which is then touched by another person, can also spread the virus. SYMPTOMS  Symptoms typically develop 1 to 3 days after you come in contact with a cold virus. Symptoms vary from person to person. They may include:  Runny nose.   Sneezing.   Nasal congestion.   Sinus irritation.   Sore throat.   Loss of voice (laryngitis).    Cough.   Fatigue.   Muscle aches.   Loss of appetite.   Headache.   Low-grade fever.  DIAGNOSIS  You might diagnose your own cold based on familiar symptoms, since most people get a cold 2 to 3 times a year. Your caregiver can confirm this based on your exam. Most importantly, your caregiver can check that your symptoms are not due to another disease such as strep throat, sinusitis, pneumonia, asthma, or epiglottitis. Blood tests, throat tests, and X-rays are not necessary to diagnose a common cold, but they may sometimes be helpful in excluding other more serious diseases. Your caregiver will decide if any further tests are required. RISKS AND COMPLICATIONS  You may be at risk for a more severe case of the common cold if you smoke cigarettes, have chronic heart disease (such as heart failure) or lung disease (such as asthma), or if you have a weakened immune system. The very young and very old are also at risk for more serious infections. Bacterial sinusitis, middle ear infections, and bacterial pneumonia can complicate the common cold. The common cold can worsen asthma and chronic obstructive pulmonary disease (COPD). Sometimes, these complications can require emergency medical care and may be life-threatening. PREVENTION  The best way to protect against getting a cold is to practice good hygiene. Avoid oral or hand contact with people with cold symptoms. Wash your hands often if contact occurs. There is no clear evidence that vitamin C, vitamin E, echinacea, or exercise reduces the chance of developing a cold. However, it is always recommended to get plenty of rest and practice good nutrition. TREATMENT  Treatment is directed at relieving symptoms. There is no cure. Antibiotics are not effective, because the infection is caused by a virus, not by bacteria. Treatment may include:  Increased fluid intake. Sports drinks offer valuable electrolytes, sugars, and fluids.   Breathing heated  mist or steam (vaporizer or shower).   Eating chicken soup or other clear broths, and maintaining good nutrition.   Getting plenty of rest.   Using gargles or lozenges for comfort.   Controlling fevers with ibuprofen or acetaminophen as directed by your caregiver.   Increasing usage of your inhaler if you have asthma.  Zinc gel and zinc lozenges, taken in the first 24 hours of the common cold, can shorten the duration and lessen the severity of symptoms. Pain medicines may help with fever, muscle aches, and throat pain. A variety of non-prescription medicines are available to treat congestion and runny nose. Your caregiver can make recommendations and may suggest nasal or lung inhalers for other symptoms.  HOME CARE INSTRUCTIONS   Only take over-the-counter or prescription medicines for pain, discomfort, or fever as directed by your caregiver.   Use a warm mist humidifier or inhale steam from a shower to increase air moisture. This may keep secretions moist and make it easier to breathe.  Drink enough water and fluids to keep your urine clear or pale yellow.   Rest as needed.   Return to work when your temperature has returned to normal or as your caregiver advises. You may need to stay home longer to avoid infecting others. You can also use a face mask and careful hand washing to prevent spread of the virus.  SEEK MEDICAL CARE IF:   After the first few days, you feel you are getting worse rather than better.   You need your caregiver's advice about medicines to control symptoms.   You develop chills, worsening shortness of breath, or brown or red sputum. These may be signs of pneumonia.   You develop yellow or brown nasal discharge or pain in the face, especially when you bend forward. These may be signs of sinusitis.   You develop a fever, swollen neck glands, pain with swallowing, or white areas in the back of your throat. These may be signs of strep throat.  SEEK IMMEDIATE  MEDICAL CARE IF:   You have a fever.   You develop severe or persistent headache, ear pain, sinus pain, or chest pain.   You develop wheezing, a prolonged cough, cough up blood, or have a change in your usual mucus (if you have chronic lung disease).   You develop sore muscles or a stiff neck.  Document Released: 03/10/2001 Document Revised: 09/03/2011 Document Reviewed: 01/16/2011 [facility] Patient Information 2012 [place], [place].

## 2011-12-27 NOTE — ED Notes (Signed)
Patient transported to CT 

## 2011-12-27 NOTE — ED Provider Notes (Signed)
History     CSN: [id number]  Arrival date & time 12/27/11  8295   First MD Initiated Contact with Patient 12/27/11 2024      Chief Complaint  Patient presents with  . Sore Throat    HPI: Patient is a 71 y.o. female presenting with pharyngitis. The history is provided by the patient.  Sore Throat This is a new problem. The current episode started in the past 7 days. The problem occurs constantly. The problem has been gradually worsening. Associated symptoms include abdominal pain, chills, congestion, coughing, a fever and a sore throat. Pertinent negatives include no chest pain, nausea, rash, urinary symptoms, vomiting or weakness.  Patient reports intermittent URI symptoms that she's had for 2 months. States the URI symptoms seemed to be improving until 2-3 days ago when she had onset of severe sore throat, intermittent cough and chills. States that she has to clear her throat a lot and she coughs frequently which makes her throat pain worse. States that occasionally her secretions are green and blood-tinged. States she feels as if her throat is swollen. Patient also reports that she was very course several days ago, but this has gradually improved.  Past Medical History  Diagnosis Date  . Diabetes mellitus   . Immune deficiency disorder     Past Surgical History  Procedure Date  . Abdominal hysterectomy   . Tonsilectomy, adenoidectomy, bilateral myringotomy and tubes     Family History  Problem Relation Age of Onset  . Diabetes Mother     History  Substance Use Topics  . Smoking status: Former Smoker    Quit date: 02/09/1986  . Smokeless tobacco: Never Used  . Alcohol Use: 0.5 oz/week    1 drink(s) per week    OB History    Grav Para Term Preterm Abortions TAB SAB Ect Mult Living                  Review of Systems  Constitutional: Positive for fever and chills.  HENT: Positive for congestion and sore throat.   Eyes: Negative.   Respiratory: Positive for cough.    Cardiovascular: Negative.  Negative for chest pain.  Gastrointestinal: Positive for abdominal pain. Negative for nausea and vomiting.  Genitourinary: Negative.   Musculoskeletal: Negative.   Skin: Negative.  Negative for rash.  Neurological: Negative.  Negative for weakness.  Hematological: Negative.   Psychiatric/Behavioral: Negative.     Allergies  Vicodin and Neurontin  Home Medications   Current Outpatient Rx  Name Route Sig Dispense Refill  . ASPIRIN EC 81 MG PO TBEC Oral Take 81 mg by mouth daily.    [name] CALCIUM CARBONATE 600 MG PO TABS Oral Take 600 mg by mouth 2 (two) times daily with a meal.    . INSULIN GLARGINE 100 UNIT/ML [place] SOLN Subcutaneous Inject 20-50 Units into the skin 2 (two) times daily. Take 50 units each morning and 20 units each evening    . LEVOTHYROXINE SODIUM 175 MCG PO TABS Oral Take 175 mcg by mouth daily.    [name] METOPROLOL SUCCINATE ER 50 MG PO TB24 Oral Take 50 mg by mouth daily.    [name] ONE-DAILY MULTI VITAMINS PO TABS Oral Take 1 tablet by mouth daily.    . NYSTATIN 100000 UNIT/GM EX POWD Topical Apply topically 4 (four) times daily. Apply under breast    . PANTOPRAZOLE SODIUM 40 MG PO TBEC Oral Take 40 mg by mouth daily.    [name] SIMVASTATIN 40 MG PO  TABS Oral Take 40 mg by mouth daily.    [name] VALSARTAN 160 MG PO TABS Oral Take 160 mg by mouth daily.      BP 186/87  Pulse 78  Temp(Src) 98.3 F (36.8 C) (Oral)  Resp 14  SpO2 96%  Physical Exam  Constitutional: She is oriented to person, place, and time. She appears well-developed and well-nourished.  HENT:  Head: Normocephalic and atraumatic.  Eyes: Conjunctivae are normal.  Neck: Neck supple.  Cardiovascular: Normal rate and regular rhythm.   Pulmonary/Chest: Effort normal and breath sounds normal.  Abdominal: Soft. Bowel sounds are normal.  Musculoskeletal: Normal range of motion.  Neurological: She is alert and oriented to person, place, and time.  Skin: Skin is warm and dry. No erythema.    Psychiatric: She has a normal mood and affect.    ED Course  Procedures   Findings and clinical impression discussed with patient. She voices some relief of pain with PO Tramadol. Will plan for discharge home with short course of Tramadol for pain and encourage patient to start an over-the-counter anti-allergy medicine as her symptoms are consistent with persistent cough and sore throat related to postnasal drainage. Patient is to arrange follow up with her primary care physician at [facility]. Patient is agreeable with plan.    Labs Reviewed  RAPID STREP SCREEN   No results found.   No diagnosis found.    MDM  HPI/PE and clinical findings c/w 1. Sore throat 2. Cough  Patient's history of recent URI suggest persistent postnasal drainage causing sore throat and persistent cough. Chest x-ray and soft tissue neck are unremarkable. The          Leanne Chang, NP 12/27/11 2226

## 2011-12-29 ENCOUNTER — Telehealth: Payer: Self-pay | Admitting: Family Medicine

## 2011-12-29 ENCOUNTER — Encounter: Payer: Self-pay | Admitting: Family Medicine

## 2011-12-29 ENCOUNTER — Ambulatory Visit (INDEPENDENT_AMBULATORY_CARE_PROVIDER_SITE_OTHER): Payer: Medicare HMO | Admitting: Family Medicine

## 2011-12-29 VITALS — BP 136/78 | HR 72 | Temp 98.3°F | Ht 62.6 in | Wt 211.0 lb

## 2011-12-29 DIAGNOSIS — R05 Cough: Secondary | ICD-10-CM | POA: Insufficient documentation

## 2011-12-29 MED ORDER — CETIRIZINE HCL 10 MG PO TABS: 10.0000 mg | ORAL_TABLET | Freq: Every day | ORAL | Status: DC

## 2011-12-29 NOTE — Progress Notes (Signed)
  Subjective:    Patient ID: Kristine Boyer, female    DOB: Oct 29, 1940, 71 y.o.   MRN: [id number]  HPI  2 weeks of cough and sore throat.  Started with cough, rhinorrhea and nasal congestion, sneezing.  Went to [employer] on March 29 th, when air conditioner was on - became hoarse and worsening sore throat.  Improved when she takes benadryl.  Painful when swallowing.  No dyspnea.  I have reviewed patient's  PMH, FH, and Social history and Medications as related to this visit. Has history of allergic rhinitis  No fever, chills, nausea, diarrhea,. Review of Systems     Objective:   Physical Exam GEN: Alert & Oriented, No acute distress HEENT: [place]/AT. EOMI, PERRLA, no conjunctival injection or scleral icterus.  Bilateral tympanic membranes intact without erythema or effusion.  .  Nares with edema and rhinorrhea.  Significant postnasal drainage observed.  Oropharynx is without erythema or exudates.  No anterior or posterior cervical lymphadenopathy.         Assessment & Plan:

## 2011-12-29 NOTE — Assessment & Plan Note (Signed)
Cough due to post nasal drainage.  No acute sinusitis.  Will rx cetirizine and encouraged to use nasal saline irrigation twice daily until resolved.  Gave patient handout on usage and kit to buy.

## 2011-12-29 NOTE — Patient Instructions (Signed)
Use new allergy medicine- Cetirizine to help with drainage down the back of your throat.  Use nasal sinus rinse

## 2011-12-29 NOTE — Telephone Encounter (Signed)
Called and left a message to have pt call me directly at [facility].

## 2011-12-31 NOTE — Telephone Encounter (Signed)
Patient is calling to speak to Archie Patten about her Sleep Apnea Machine.  She needs to know where the place is that she is supposed to go to pick it up.

## 2012-01-04 NOTE — Telephone Encounter (Signed)
Called [name] and she stated that [name] will come out to help her with the machine.Kristine Boyer

## 2012-01-15 ENCOUNTER — Telehealth: Payer: Self-pay | Admitting: Family Medicine

## 2012-01-15 NOTE — Telephone Encounter (Signed)
Left a message again to have pt call me regarding recent clinic visit, f/u care, and lab results

## 2012-03-07 ENCOUNTER — Encounter: Payer: Self-pay | Admitting: Home Health Services

## 2012-03-21 ENCOUNTER — Other Ambulatory Visit: Payer: Self-pay | Admitting: Family Medicine

## 2012-03-21 NOTE — Telephone Encounter (Signed)
Pt needs to call [facility] to have them request her Rx from her previous pharmacy. No sure how to e prescribe to mail order pharmacy. If pt unable to get Rx transferred then please let me know and I will print them out for pt to pick up and send to [facility].

## 2012-03-21 NOTE — Telephone Encounter (Signed)
Patient is calling because she needs refill sent to her new mail order pharmacy, [facility].  She needs refills on all of her medications including her diabetic supplies.  Zyrtec, Lantus, Synthroid, Toprol XL, Protonix, Zocor, Diovan and diabetic supplies.

## 2012-03-22 NOTE — Telephone Encounter (Signed)
MD can print out hard copies of rx's and fax it to [phone]

## 2012-03-22 NOTE — Telephone Encounter (Signed)
Spoke with patient yesterday, she states pharmacy was supposed to be sending refill requests. I told her I would check and if we had not received them she gave me a number to call #[phone].

## 2012-03-24 ENCOUNTER — Other Ambulatory Visit: Payer: Self-pay | Admitting: Family Medicine

## 2012-03-24 MED ORDER — LEVOTHYROXINE SODIUM 175 MCG PO TABS: 175.0000 ug | ORAL_TABLET | Freq: Every day | ORAL | Status: DC

## 2012-03-24 MED ORDER — METOPROLOL SUCCINATE ER 50 MG PO TB24: 50.0000 mg | ORAL_TABLET | Freq: Every day | ORAL | Status: DC

## 2012-03-24 MED ORDER — INSULIN GLARGINE 100 UNIT/ML ~~LOC~~ SOLN: 20.0000 [IU] | Freq: Two times a day (BID) | SUBCUTANEOUS | Status: DC

## 2012-03-24 MED ORDER — SIMVASTATIN 40 MG PO TABS: 40.0000 mg | ORAL_TABLET | Freq: Every day | ORAL | Status: DC

## 2012-03-24 MED ORDER — PANTOPRAZOLE SODIUM 40 MG PO TBEC: 40.0000 mg | DELAYED_RELEASE_TABLET | Freq: Every day | ORAL | Status: DC

## 2012-03-24 MED ORDER — VALSARTAN 160 MG PO TABS: 160.0000 mg | ORAL_TABLET | Freq: Every day | ORAL | Status: DC

## 2012-03-29 ENCOUNTER — Telehealth: Payer: Self-pay | Admitting: Family Medicine

## 2012-03-29 NOTE — Telephone Encounter (Signed)
Spoke with patient, she states that she signed for [facility] and then decided she didn't want it and that the salesman is supposed to come fix this for her on July 8th but until this is cancelled Francine Graven will not send her meds. She is primarily concerned about being out of her insulin, informed her that we had lantus solostar samples that I would leave for her. Patient expressed understanding.

## 2012-03-29 NOTE — Telephone Encounter (Signed)
Pt is upset about how [employer] has switched her insurance and she didn't want to switch from [facility].  Can't seem to get anyone to change it back and now is having a hard time getting her meds.  Wants to speak with Dr Konrad Dolores or his nurse

## 2012-04-05 ENCOUNTER — Ambulatory Visit (INDEPENDENT_AMBULATORY_CARE_PROVIDER_SITE_OTHER): Payer: Medicare HMO | Admitting: Family Medicine

## 2012-04-05 VITALS — BP 165/91 | HR 81 | Temp 98.6°F | Ht 62.6 in | Wt 210.0 lb

## 2012-04-05 DIAGNOSIS — I10 Essential (primary) hypertension: Secondary | ICD-10-CM

## 2012-04-05 DIAGNOSIS — G4733 Obstructive sleep apnea (adult) (pediatric): Secondary | ICD-10-CM

## 2012-04-05 DIAGNOSIS — E119 Type 2 diabetes mellitus without complications: Secondary | ICD-10-CM

## 2012-04-05 LAB — POCT GLYCOSYLATED HEMOGLOBIN (HGB A1C): Hemoglobin A1C: 7.8

## 2012-04-05 NOTE — Patient Instructions (Addendum)
Thank you for coming into clinic today You are doing great. Please take the medications prescribed today Please make an appointment to see me after 04/28/2012 so we can revisit your overall health, medications and recommended testing.  Please call with any concerns or questions Please make sure to call me with the number for your insurance agent.  Have a blessed day

## 2012-04-07 ENCOUNTER — Telehealth: Payer: Self-pay | Admitting: Family Medicine

## 2012-04-07 NOTE — Telephone Encounter (Signed)
C PAP has been denied because the patient is not using it at all and if the MD wants to discuss this further then have him call her, but if not, he doesn't need to call.

## 2012-04-07 NOTE — Telephone Encounter (Signed)
Called and left a voicemail w/ pt to inform that I have contacted life insurance agent Mr. Pollie Friar and am awaiting a call back I also let her know that I received a message from my staff that her CPAP had been denied and would like to talk to her about what is going on.

## 2012-04-08 NOTE — Assessment & Plan Note (Signed)
Significant improvement in overall wakefullness, energy level, and sleep pattern now that pt using CPAP. Pt thoat dry in am so will discuss titration of humidity w/ home nurse and pt to stop taking Mucinex QHS.

## 2012-04-11 NOTE — Assessment & Plan Note (Addendum)
Given additional Lantus pen today. This will provide pt w/ enough to make it to next month when insurance starts again.  Concern for diabetic retinopathy. Refer to optho

## 2012-04-11 NOTE — Progress Notes (Signed)
  Subjective:    Patient ID: Kristine Boyer, female    DOB: Apr 21, 1941, 71 y.o.   MRN: [id number]  HPI CC: fatigue, DM supplies,   OSA: significantly better since starting CPAP. Feels well rested in am and w/o HA. Taking Mucinex at night to help clear up her sinuses. Only complaint on CPAP in am is dry throat. Not feeling tired during the day. Denies HA, SOB, dyspnea  DM: Can not find BG meter for past several weeks since moving. Still taking 50 lantus in am and 20 at night. Lost insurance coverage for 30 days so unable to obtain insulin. Pt received Lantus here once this month. Has one Lantus pen for the rest of the month. Denies sensation changes. Some deterioration of vision. Has not been to optometrist in several years. Denies, shaking/tremouslesness, low energy, altered mentation, HA, n/v/d/c.  HTN: Taking Metop, Valsartan, and HCTZ. Denies HA, CP, Palpitations, SOB, diaphoresis, DOE, syncope.  ONe episode of near syncope w/ dizzyness upon standing after a day of very little PO that occurred late at night.    Review of Systems Per HPI    Objective:   Physical Exam BP 165/91  Pulse 81  Temp 98.6 F (37 C) (Oral)  Ht 5' 2.6" (1.59 m)  Wt 210 lb (95.255 kg)  BMI 37.68 kg/m2  Gen: NAD HEENT:  R 20/60, L 20/80, B 20/30 CV: RRR no m/r/g Res: CTAB, normal effort      Assessment & Plan:

## 2012-04-11 NOTE — Addendum Note (Signed)
Addended by: Konrad Dolores, Tameisha Covell J on: 04/11/2012 04:26 PM   Modules accepted: Orders

## 2012-04-11 NOTE — Assessment & Plan Note (Signed)
BP elevated. HCTZ not in MAR. Pt not to take due to h/o hypokalemia. WIll increase Metop to 100mg  BID. Red flags reviewed.

## 2012-04-18 ENCOUNTER — Telehealth: Payer: Self-pay | Admitting: Family Medicine

## 2012-04-18 NOTE — Telephone Encounter (Signed)
Pt had a lot of questions re: keeping her CPAP machine as well as being out of insulin (should've had enough until end of mo) and wanting something other than metoprolol. Advised pt to sched appt to discuss all these issues, pt agreed.

## 2012-04-22 ENCOUNTER — Ambulatory Visit (INDEPENDENT_AMBULATORY_CARE_PROVIDER_SITE_OTHER): Payer: Medicare HMO | Admitting: Family Medicine

## 2012-04-22 ENCOUNTER — Encounter: Payer: Self-pay | Admitting: Family Medicine

## 2012-04-22 VITALS — BP 162/92 | HR 75 | Temp 98.6°F | Ht 62.6 in | Wt 204.0 lb

## 2012-04-22 DIAGNOSIS — E785 Hyperlipidemia, unspecified: Secondary | ICD-10-CM

## 2012-04-22 DIAGNOSIS — R209 Unspecified disturbances of skin sensation: Secondary | ICD-10-CM

## 2012-04-22 DIAGNOSIS — G4733 Obstructive sleep apnea (adult) (pediatric): Secondary | ICD-10-CM

## 2012-04-22 DIAGNOSIS — I10 Essential (primary) hypertension: Secondary | ICD-10-CM

## 2012-04-22 DIAGNOSIS — M1990 Unspecified osteoarthritis, unspecified site: Secondary | ICD-10-CM

## 2012-04-22 MED ORDER — SIMVASTATIN 40 MG PO TABS: 40.0000 mg | ORAL_TABLET | Freq: Every day | ORAL | Status: DC

## 2012-04-22 MED ORDER — METOPROLOL SUCCINATE ER 100 MG PO TB24: 100.0000 mg | ORAL_TABLET | Freq: Every day | ORAL | Status: DC

## 2012-04-22 NOTE — Patient Instructions (Addendum)
Thank you for coming in today Please call your insurance when it starts in Aug to have them pay for your CPAP.  Please stop taking the pravastatin and Metoprolol Tartate from last appointment I have refilled your simvastatin and increased your previous dose of Metoprolol Succinate. There are other blood pressure options but I want to see how increasing your metoprolol works first.  Please have a blessed weekend, and keep up the great work with your weight loss.

## 2012-04-26 DIAGNOSIS — R209 Unspecified disturbances of skin sensation: Secondary | ICD-10-CM

## 2012-04-26 HISTORY — DX: Unspecified disturbances of skin sensation: R20.9

## 2012-04-26 NOTE — Assessment & Plan Note (Addendum)
No evidence of diabetic neuropathy, or true edema/swelling. No lesions. Likely due to medication side effect. Will stop pravastatin and metoprolol tartrate, and transition to pravastatin and metoprolol succinate.

## 2012-04-26 NOTE — Progress Notes (Signed)
  Subjective:    Patient ID: Kristine Boyer, female    DOB: Feb 22, 1941, 71 y.o.   MRN: [id number]  HPI  Chief complaint: Feet swelling:  Swelling: Patient reports sensation of lower extremity swelling while on medications that were started after last appointment, namely pravastatin, and metoprolol tartrate. Patient unsure which of these 2 medications is causing symptoms. Onset of symptoms shortly after taking medications which involve sensation of numbness in lower extremities, most predominantly feet along the tops of the feet. Symptoms resolve on the round. No other aggravating or alleviating factors. Sensation of swelling and numbness does not migrate. Patient's past medical history significant for treatment of blood pressure with metoprolol succinate without B. Symptoms. This has never happened to the patient before. Denies fever, weight loss, nausea, vomiting, diarrhea, headache, syncope, claudication.  Hypertension: Due to intolerance listed above the patient has not been taking metoprolol. Blood pressure in clinic today reviewed. Patient has been exercising regularly at her local pool, and feels her aerobic tolerance is increasing. Patient's weight is down today. Denies headache, palpitations, chest pain, shortness of breath, lightheadedness  OSA: Patient reports wearing CPAP every night. This has improved her symptoms of daytime sleepiness, morning headache, fatigue greatly. Patient reports point off mask typically every night after a minimum of 3-1/2-4 hours as she feels that when she wakes up she does not tolerate the mask. This is due to a feeling of claustrophobia. Concern that insurance won't pay for this in the future.  Review of Systems Per history of present illness    Objective:   Physical Exam  General: No acute distress, obese Musculoskeletal: No joint effusions, normal range of motion, lower extremities nonpainful to palpation Extremities: No edema of lower extremities     Assessment & Plan:

## 2012-04-26 NOTE — Assessment & Plan Note (Deleted)
Patient to reapply for CPAP through new insurance as of August 1. Patient with obvious benefit of CPAP. Patient to titrate O2 settings to improve tolerance. 

## 2012-04-26 NOTE — Assessment & Plan Note (Signed)
Due to intolerance will change back to metoprolol succinate. Will increase dose to 100 mg daily. Patient aware of signs and symptoms of severe hypotension and hypertension.

## 2012-04-26 NOTE — Assessment & Plan Note (Signed)
Due to intolerance will change back from pravastatin to simvastatin.

## 2012-04-26 NOTE — Assessment & Plan Note (Signed)
Patient to reapply for CPAP through new insurance as of August 1. Patient with obvious benefit of CPAP. Patient to titrate O2 settings to improve tolerance.

## 2012-04-28 ENCOUNTER — Ambulatory Visit: Payer: Medicare HMO | Admitting: Family Medicine

## 2012-06-04 ENCOUNTER — Encounter (HOSPITAL_COMMUNITY): Payer: Self-pay

## 2012-06-04 ENCOUNTER — Emergency Department (HOSPITAL_COMMUNITY): Payer: Medicare Other

## 2012-06-04 ENCOUNTER — Telehealth: Payer: Self-pay | Admitting: Internal Medicine

## 2012-06-04 ENCOUNTER — Emergency Department (HOSPITAL_COMMUNITY)
Admission: EM | Admit: 2012-06-04 | Discharge: 2012-06-04 | Disposition: A | Discharge status: Home / Self Care | Payer: Medicare Other | Attending: Emergency Medicine | Admitting: Emergency Medicine

## 2012-06-04 DIAGNOSIS — W19XXXA Unspecified fall, initial encounter: Secondary | ICD-10-CM

## 2012-06-04 DIAGNOSIS — Z794 Long term (current) use of insulin: Secondary | ICD-10-CM | POA: Insufficient documentation

## 2012-06-04 DIAGNOSIS — W010XXA Fall on same level from slipping, tripping and stumbling without subsequent striking against object, initial encounter: Secondary | ICD-10-CM | POA: Insufficient documentation

## 2012-06-04 DIAGNOSIS — E119 Type 2 diabetes mellitus without complications: Secondary | ICD-10-CM | POA: Insufficient documentation

## 2012-06-04 DIAGNOSIS — IMO0002 Reserved for concepts with insufficient information to code with codable children: Secondary | ICD-10-CM | POA: Insufficient documentation

## 2012-06-04 DIAGNOSIS — R109 Unspecified abdominal pain: Secondary | ICD-10-CM | POA: Insufficient documentation

## 2012-06-04 DIAGNOSIS — S301XXA Contusion of abdominal wall, initial encounter: Secondary | ICD-10-CM

## 2012-06-04 DIAGNOSIS — L02412 Cutaneous abscess of left axilla: Secondary | ICD-10-CM

## 2012-06-04 LAB — CBC WITH DIFFERENTIAL/PLATELET
Basophils Absolute: 0 10*3/uL (ref 0.0–0.1)
Lymphocytes Relative: 42 % (ref 12–46)
Neutro Abs: 5.3 10*3/uL (ref 1.7–7.7)
Neutrophils Relative %: 49 % (ref 43–77)
Platelets: 170 10*3/uL (ref 150–400)
RBC: 4.49 MIL/uL (ref 3.87–5.11)
RDW: 13 % (ref 11.5–15.5)
WBC: 10.9 10*3/uL — ABNORMAL HIGH (ref 4.0–10.5)

## 2012-06-04 LAB — BASIC METABOLIC PANEL
CO2: 32 mEq/L (ref 19–32)
Calcium: 9.2 mg/dL (ref 8.4–10.5)
Chloride: 105 mEq/L (ref 96–112)
Potassium: 2.8 mEq/L — ABNORMAL LOW (ref 3.5–5.1)
Sodium: 143 mEq/L (ref 135–145)

## 2012-06-04 LAB — GLUCOSE, CAPILLARY
Glucose-Capillary: 48 mg/dL — ABNORMAL LOW (ref 70–99)
Glucose-Capillary: 74 mg/dL (ref 70–99)
Glucose-Capillary: 60 mg/dL — ABNORMAL LOW (ref 70–99)

## 2012-06-04 MED ORDER — SULFAMETHOXAZOLE-TRIMETHOPRIM 800-160 MG PO TABS: 1.0000 | ORAL_TABLET | Freq: Two times a day (BID) | ORAL | Status: AC

## 2012-06-04 MED ORDER — MORPHINE SULFATE 4 MG/ML IJ SOLN
4.0000 mg | Freq: Once | INTRAMUSCULAR | Status: AC
  Administered 2012-06-04: 4 mg via INTRAVENOUS
  Filled 2012-06-04: qty 1

## 2012-06-04 MED ORDER — DEXTROSE 50 % IV SOLN
1.0000 | Freq: Once | INTRAVENOUS | Status: AC
  Administered 2012-06-04: 50 mL via INTRAVENOUS
  Filled 2012-06-04: qty 50

## 2012-06-04 MED ORDER — IOHEXOL 300 MG/ML  SOLN
100.0000 mL | Freq: Once | INTRAMUSCULAR | Status: AC | PRN
  Administered 2012-06-04: 100 mL via INTRAVENOUS

## 2012-06-04 MED ORDER — OXYCODONE-ACETAMINOPHEN 5-325 MG PO TABS: 1.0000 | ORAL_TABLET | Freq: Four times a day (QID) | ORAL | Status: AC | PRN

## 2012-06-04 NOTE — ED Provider Notes (Signed)
History   This chart was scribed for Geoffery Lyons, MD by Toya Smothers. The patient was seen in room TR06C/TR06C. Patient's care was started at 1251.  CSN: [id number]  Arrival date & time 06/04/12  1251   None     Chief Complaint  Patient presents with  . Abscess   HPI  Kristine Boyer is a 71 y.o. female who presents to the Emergency Department complaining of 3 days of sudden onset moderate abscess underneath the the left axilla. Pt reports that she has never experienced similar symptoms in the axilla region. She denies change in detergents and soaps. She denotes associated bloody drainage from the abscess.  Pt also c/o 1 week of abdominal pain as the result of injury sustained while slipping in the bath tub. Pain has increased acutly over the past day. Pt denies fever, chills, emesis, nausea, rash, and cough.  Past Medical History  Diagnosis Date  . Diabetes mellitus   . Immune deficiency disorder     Past Surgical History  Procedure Date  . Abdominal hysterectomy   . Tonsilectomy, adenoidectomy, bilateral myringotomy and tubes     Family History  Problem Relation Age of Onset  . Diabetes Mother     History  Substance Use Topics  . Smoking status: Former Smoker    Quit date: 02/09/1986  . Smokeless tobacco: Never Used  . Alcohol Use: 0.5 oz/week    1 drink(s) per week   Review of Systems  Constitutional: Negative for fever.       10 Systems reviewed and are negative for acute change except as noted in the HPI.  HENT: Negative for congestion.   Eyes: Negative for discharge and redness.  Respiratory: Negative for cough and shortness of breath.   Cardiovascular: Negative for chest pain.  Gastrointestinal: Negative for vomiting and abdominal pain.  Genitourinary: Positive for flank pain.  Musculoskeletal: Negative for back pain.  Skin: Negative for rash.       Abscess  Neurological: Negative for syncope, numbness and headaches.  Psychiatric/Behavioral:       No  behavior change.  All other systems reviewed and are negative.    Allergies  Vicodin and Neurontin  Home Medications   Current Outpatient Rx  Name Route Sig Dispense Refill  . ASPIRIN EC 81 MG PO TBEC Oral Take 81 mg by mouth daily.    [name] CALCIUM CARBONATE 600 MG PO TABS Oral Take 600 mg by mouth 2 (two) times daily with a meal.    . INSULIN GLARGINE 100 UNIT/ML [employer] SOLN Subcutaneous Inject 20-50 Units into the skin 2 (two) times daily. Take 50 units each morning and 20 units each evening 10 mL 6  . LEVOTHYROXINE SODIUM 175 MCG PO TABS Oral Take 1 tablet (175 mcg total) by mouth daily. 90 tablet 3  . METOPROLOL SUCCINATE ER 100 MG PO TB24 Oral Take 1 tablet (100 mg total) by mouth daily. 90 tablet 1  . ONE-DAILY MULTI VITAMINS PO TABS Oral Take 1 tablet by mouth daily.    [name] PANTOPRAZOLE SODIUM 40 MG PO TBEC Oral Take 1 tablet (40 mg total) by mouth daily. 90 tablet 3  . SIMVASTATIN 40 MG PO TABS Oral Take 1 tablet (40 mg total) by mouth daily. 90 tablet 1  . VALSARTAN 160 MG PO TABS Oral Take 1 tablet (160 mg total) by mouth daily. 90 tablet 3    BP 176/80  Pulse 102  Temp 98.2 F (36.8 C) (Oral)  Resp 20  SpO2 100%  Physical Exam  Musculoskeletal:       Tenderness to letfs side of abdomen, and chest wall.  Skin:       3 cm round abscess underneath the left axilla. Mild surrounding erythema.    ED Course  Procedures (including critical care time) DIAGNOSTIC STUDIES: Oxygen Saturation is 100% on room air, normal by my interpretation.    COORDINATION OF CARE: 14:20- Evaluated Pt. Pt is awake, alert, and oriented.   Labs Reviewed  CBC WITH DIFFERENTIAL - Abnormal; Notable for the following:    WBC 10.9 (*)     HCT 35.8 (*)     Lymphs Abs 4.6 (*)     All other components within normal limits  BASIC METABOLIC PANEL   No results found.   No diagnosis found.  INCISION AND DRAINAGE Performed by: Geoffery Lyons Consent: Verbal consent obtained. Risks and benefits:  risks, benefits and alternatives were discussed Type: abscess  Body area: left axilla  Anesthesia: local infiltration  Local anesthetic: lidocaine 1% without epinephrine  Anesthetic total: 2 ml  Complexity: complex Blunt dissection to break up loculations  Drainage: purulent  Drainage amount: moderate  Packing material: 1/4 in iodoform gauze  Patient tolerance: Patient tolerated the procedure well with no immediate complications.     MDM  The patient presents with an axillary abscess and complaints of abd pain since falling and striking her abdomen on the edge of the tub.  The abscess was I and Ded as above and the ct scan fails to reveal any intra-abdominal injuries or other pathology.  She was treated with pain medications, is feeling better, and appears stable for discharge.  She will be given antibiotics for the abscess along with pain medications and follow up in two days.        I personally performed the services described in this documentation, which was scribed in my presence. The recorded information has been reviewed and considered.      Geoffery Lyons, MD 06/06/12 [phone]

## 2012-06-04 NOTE — ED Notes (Signed)
Patient remains alert and oriented.  She is complaining of lower abd pain,  She now has IV for CT scan.  Will administer pain meds as well

## 2012-06-04 NOTE — ED Notes (Signed)
Left axilla

## 2012-06-04 NOTE — Telephone Encounter (Signed)
Called by Ms. Waldeck with concerns of a lump under her left breast and left sided chest and upper stomach pain.  She states that she has a lump on the left breast that has been present for about 2-3 weeks and has been getting progressively bigger since then.  It is now about the size of a quarter in her estimation.  She states that it has some redness around it and is tender to the touch.  She has been putting a warm compress on it and feels like yesterday it started to drain some.    She also states that she has had left sided chest pain over the last day or so.  The pain gets worse with movement and will get better when she sits down and takes a pain medication.  She states that it never goes away completely and also occasionally goes over to the right.  Not worse with deep breathing and she has no SOB or diaphoresis.  Plan: She needs to be seen and I encouraged her to present to either an Urgent care facility or to the emergency department.  She agrees and will get her son to drive her to the ER to be seen.

## 2012-06-04 NOTE — ED Notes (Signed)
Patient transported to CT 

## 2012-06-04 NOTE — ED Notes (Signed)
cbg checked per patient and family request,  cbg reported to edp as 48.  Patient given food/drink to tx  edp at bedside

## 2012-06-07 ENCOUNTER — Ambulatory Visit (INDEPENDENT_AMBULATORY_CARE_PROVIDER_SITE_OTHER): Payer: Medicare HMO | Admitting: Family Medicine

## 2012-06-07 ENCOUNTER — Encounter: Payer: Self-pay | Admitting: Family Medicine

## 2012-06-07 VITALS — BP 164/78 | HR 65 | Temp 97.7°F | Ht 62.6 in | Wt 207.0 lb

## 2012-06-07 DIAGNOSIS — M25552 Pain in left hip: Secondary | ICD-10-CM

## 2012-06-07 DIAGNOSIS — G4733 Obstructive sleep apnea (adult) (pediatric): Secondary | ICD-10-CM

## 2012-06-07 DIAGNOSIS — M25559 Pain in unspecified hip: Secondary | ICD-10-CM

## 2012-06-07 DIAGNOSIS — L0291 Cutaneous abscess, unspecified: Secondary | ICD-10-CM

## 2012-06-07 DIAGNOSIS — M7062 Trochanteric bursitis, left hip: Secondary | ICD-10-CM

## 2012-06-07 DIAGNOSIS — M76899 Other specified enthesopathies of unspecified lower limb, excluding foot: Secondary | ICD-10-CM

## 2012-06-07 NOTE — Patient Instructions (Addendum)
Thank you for coming in to see me today You are doing very well Please go to physical therapy Please come back to see me on Friday Please continue to apply antibiotic cream under your arm Please continue to take your antibiotics

## 2012-06-10 ENCOUNTER — Encounter: Payer: Self-pay | Admitting: Family Medicine

## 2012-06-10 ENCOUNTER — Ambulatory Visit (INDEPENDENT_AMBULATORY_CARE_PROVIDER_SITE_OTHER): Payer: Self-pay | Admitting: Family Medicine

## 2012-06-10 VITALS — BP 172/81 | HR 65 | Temp 98.3°F | Ht 62.6 in | Wt 207.0 lb

## 2012-06-10 DIAGNOSIS — L0291 Cutaneous abscess, unspecified: Secondary | ICD-10-CM

## 2012-06-10 DIAGNOSIS — I10 Essential (primary) hypertension: Secondary | ICD-10-CM

## 2012-06-10 DIAGNOSIS — M25559 Pain in unspecified hip: Secondary | ICD-10-CM

## 2012-06-10 DIAGNOSIS — M25552 Pain in left hip: Secondary | ICD-10-CM

## 2012-06-10 NOTE — Assessment & Plan Note (Signed)
Resolving. Pt to return on 9/13 fo evaluation. May need to change ABX vs additional drainage and bandaging

## 2012-06-10 NOTE — Assessment & Plan Note (Addendum)
Trochanteric bursitis of lt hip Injections have worked for pt in past w/ other musc complaints Solumedol/Lidocaine injection w/ relief w/in 5 min.  Pt referred to PT

## 2012-06-10 NOTE — Assessment & Plan Note (Addendum)
Trochanteric bursitis resolved Osteoarthritis. NSAIDs PRN PT

## 2012-06-10 NOTE — Progress Notes (Addendum)
Patient ID: Kristine Boyer, female   DOB: 1941/09/23, 71 y.o.   MRN: [id number]  Procedure: Trochanteric bursa injection Consent signed and scanned into record. Medication:  40mg  Solumedrol  2 cc Lidocaine 2% without epi Preparation: area cleansed with alcohol and betadine Time Out taken  Injection  Landmarks identified 2 cc of medication injected into joint space using a perpendicular approach Patient tolerated well without bleeding or paresthesias  Patient had good range of motion of joint after injection  Shelly Flatten, MD Family Medicine PGY-2 06/10/2012, 1:02 PM

## 2012-06-10 NOTE — Assessment & Plan Note (Signed)
Pt to start using CPAP Reiterated impotance ov CPAP and health risks associated w/ OSA

## 2012-06-10 NOTE — Progress Notes (Signed)
  Subjective:    Patient ID: Kristine Boyer, female    DOB: 05/11/1941, 71 y.o.   MRN: [id number]  HPI  CC: Abscess, hip pain  Abscess: I&D on 9/7 and started on Bactirm though did not start ABX until 9/9. Improved pain and swelling since that time. Packing came out on 9/9 when cleaning. Continues to drain and smell bad. Denies fever, n/v/d/c, HA. H/o similar abscess in groin area years ago  Hip pain: Pain in hip for years since fall at work. Exacerbated over past 2 weeks. Ache in nature bnut also sharp at times. Hurts w/ hip movement and exercise. Oxycodone in past w/ relief but makes sleepy. Denies any effusion, popping, fever. H/o arthritis in multiple locations  OSA: Does not use CPAP as told she had to pay by DME provider as not covered by insurance. Pt continues to snore at night and feel tired during day. WIll start using CPAP again.   PMHx reviewed  Review of Systems Per HPI    Objective:   Physical Exam Gen: NAD MUSC. Normal passive flexion of hip. Point tenderness to palpation of L greater trocanteric region of hip. Tenderness on ambulation. No crepitus Skin: 1cm incision under L axilla w/ purulent/bloody drainage. Approximately 5cc dainage w/ manual compression. 2x2com area of induration. No erythema of skin       Assessment & Plan:

## 2012-06-10 NOTE — Patient Instructions (Addendum)
Thank you for coming into clinic today  Your hip hurts from arthritis. Please take 600mg  of NSAIDs (ibuprofen, advil, motrin, or Aleve) up to 4 times a day. Please do not take these medications regularly for more than 1-2weeks. Continue taking your antibiotics. Please allow your abscess to breath w/o a bandage on for several hours a day.. Please let me know if you do not get better.  Osteoarthritis Osteoarthritis is the most common form of arthritis. It is redness, soreness, and swelling (inflammation) affecting the cartilage. Cartilage acts as a cushion, covering the ends of bones where they meet to form a joint. CAUSES  Over time, the cartilage begins to wear away. This causes bone to rub on bone. This produces pain and stiffness in the affected joints. Factors that contribute to this problem are:  Excessive body weight.   Age.   Overuse of joints.  SYMPTOMS   People with osteoarthritis usually experience joint pain, swelling, or stiffness.   Over time, the joint may lose its normal shape.   Small deposits of bone (osteophytes) may grow on the edges of the joint.   Bits of bone or cartilage can break off and float inside the joint space. This may cause more pain and damage.   Osteoarthritis can lead to depression, anxiety, feelings of helplessness, and limitations on daily activities.  The most commonly affected joints are in the:  Ends of the fingers.   Thumbs.   Neck.   Lower back.   Knees.   Hips.  DIAGNOSIS  Diagnosis is mostly based on your symptoms and exam. Tests may be helpful, including:  X-rays of the affected joint.   A computerized magnetic scan (MRI).   Blood tests to rule out other types of arthritis.   Joint fluid tests. This involves using a needle to draw fluid from the joint and examining the fluid under a microscope.  TREATMENT  Goals of treatment are to control pain, improve joint function, maintain a normal body weight, and maintain a healthy  lifestyle. Treatment approaches may include:  A prescribed exercise program with rest and joint relief.   Weight control with nutritional education.   Pain relief techniques such as:   Properly applied heat and cold.   Electric pulses delivered to nerve endings under the skin (transcutaneous electrical nerve stimulation, TENS).   Massage.   Certain supplements. Ask your caregiver before using any supplements, especially in combination with prescribed drugs.   Medicines to control pain, such as:   Acetaminophen.   Nonsteroidal anti-inflammatory drugs (NSAIDs), such as naproxen.   Narcotic or central-acting agents, such as tramadol. This drug carries a risk of addiction and is generally prescribed for short-term use.   Corticosteroids. These can be given orally or as injection. This is a short-term treatment, not recommended for routine use.   Surgery to reposition the bones and relieve pain (osteotomy) or to remove loose pieces of bone and cartilage. Joint replacement may be needed in advanced states of osteoarthritis.  HOME CARE INSTRUCTIONS  Your caregiver can recommend specific types of exercise. These may include:  Strengthening exercises. These are done to strengthen the muscles that support joints affected by arthritis. They can be performed with weights or with exercise bands to add resistance.   Aerobic activities. These are exercises, such as brisk walking or low-impact aerobics, that get your heart pumping. They can help keep your lungs and circulatory system in shape.   Range-of-motion activities. These keep your joints limber.   Balance  and agility exercises. These help you maintain daily living skills.  Learning about your condition and being actively involved in your care will help improve the course of your osteoarthritis. SEEK MEDICAL CARE IF:   You feel hot or your skin turns red.   You develop a rash in addition to your joint pain.   You have an oral  temperature above 102 F (38.9 C).  FOR MORE INFORMATION  National Institute of Arthritis and Musculoskeletal and Skin Diseases: www.niams.http://www.myers.net/ [employer] on Aging: https://walker.com/ American College of Rheumatology: www.rheumatology.org Document Released: 09/14/2005 Document Revised: 09/03/2011 Document Reviewed: 12/26/2009 [facility] Patient Information 2012 [place], [place].

## 2012-06-13 NOTE — Assessment & Plan Note (Signed)
3 more days of bactrim per ED. Continues to improve. No further evaluation unless condition worsens. Red flags discussed

## 2012-06-13 NOTE — Assessment & Plan Note (Signed)
Elevated today but pt in pain. WIll monitor. No changes

## 2012-06-13 NOTE — Progress Notes (Signed)
  Subjective:    Patient ID: Kristine Boyer, female    DOB: [birth year], 71 y.o.   MRN: [id number]  HPI  CC: Hip pain  Hip pain: Initially improved w/ indjection. Conintues to be painful for pt today. Worse w/ movement adn wt bearing. Improves w/ rest adn NSAIDs. Denies any fever, effusion.  Abscess,: Continues to take ABX, non painful. Continues to use triple antibiotic cream and bandage. Minimal drainage that is nonpuroulent. Denies fever, discharge, HA, n/v/d/c  HTN: Elevated today in clinic. Taking medications but anxious today over abscess and in pain due to Rt hip.   Review of Systems Per hpi    Objective:   Physical Exam  Gen: mildly anxious Skin: L axilla w/ 0.5 opening from previous I&D w/ minimal bloody drainage on expressing lesion. 1x1 cm induration. No erythema Musc: Mild pain on significant flexion of hip past 90 degrees and w/ internal and external rotation of hip. No point tendenrness over trochanteric region      Assessment & Plan:

## 2012-06-16 ENCOUNTER — Ambulatory Visit: Payer: Medicare Other | Attending: Family Medicine | Admitting: Physical Therapy

## 2012-06-16 DIAGNOSIS — M25559 Pain in unspecified hip: Secondary | ICD-10-CM | POA: Insufficient documentation

## 2012-06-16 DIAGNOSIS — IMO0001 Reserved for inherently not codable concepts without codable children: Secondary | ICD-10-CM | POA: Insufficient documentation

## 2012-06-16 DIAGNOSIS — M2569 Stiffness of other specified joint, not elsewhere classified: Secondary | ICD-10-CM | POA: Insufficient documentation

## 2012-06-20 ENCOUNTER — Ambulatory Visit: Payer: Medicare Other | Admitting: Physical Therapy

## 2012-06-22 ENCOUNTER — Ambulatory Visit: Payer: Medicare Other | Admitting: Physical Therapy

## 2012-06-27 ENCOUNTER — Ambulatory Visit: Payer: Medicare Other | Admitting: Physical Therapy

## 2012-06-28 DEATH — deceased

## 2012-06-29 ENCOUNTER — Ambulatory Visit: Payer: Medicare Other | Admitting: Physical Therapy

## 2012-06-30 ENCOUNTER — Ambulatory Visit: Payer: Medicare Other | Attending: Family Medicine | Admitting: Physical Therapy

## 2012-06-30 DIAGNOSIS — IMO0001 Reserved for inherently not codable concepts without codable children: Secondary | ICD-10-CM | POA: Insufficient documentation

## 2012-06-30 DIAGNOSIS — M25559 Pain in unspecified hip: Secondary | ICD-10-CM | POA: Insufficient documentation

## 2012-06-30 DIAGNOSIS — M2569 Stiffness of other specified joint, not elsewhere classified: Secondary | ICD-10-CM | POA: Insufficient documentation

## 2012-07-04 ENCOUNTER — Telehealth: Payer: Self-pay | Admitting: Family Medicine

## 2012-07-04 DIAGNOSIS — E119 Type 2 diabetes mellitus without complications: Secondary | ICD-10-CM

## 2012-07-04 MED ORDER — INSULIN PEN NEEDLE 31G X 5 MM MISC: Status: DC

## 2012-07-04 NOTE — Telephone Encounter (Signed)
Needs refill on her needles and is changing pharmacies to [facility]

## 2012-07-05 ENCOUNTER — Ambulatory Visit: Payer: Medicare Other | Admitting: Physical Therapy

## 2012-07-07 ENCOUNTER — Ambulatory Visit: Payer: Medicare Other | Admitting: Physical Therapy

## 2012-07-07 ENCOUNTER — Encounter: Payer: Medicare Other | Admitting: Physical Therapy

## 2012-07-12 ENCOUNTER — Ambulatory Visit: Payer: Medicare Other | Admitting: Physical Therapy

## 2012-07-14 ENCOUNTER — Other Ambulatory Visit: Payer: Self-pay | Admitting: Family Medicine

## 2012-07-14 ENCOUNTER — Ambulatory Visit: Payer: Medicare Other | Admitting: Physical Therapy

## 2012-07-14 DIAGNOSIS — G4733 Obstructive sleep apnea (adult) (pediatric): Secondary | ICD-10-CM

## 2012-07-14 NOTE — Assessment & Plan Note (Signed)
Received documentation from [employer] that pt not wearing CPAP due to intolerance to high pressure.  Orders written and sent to [employer] allowing downward titration of CPAP until pt able to tolerate Pt had initially undergone CPAP titration in conjunction w/ sleep study. While perhaps less than ideal, it is more important for pt to be receiving some CPAP as opposed to no CPAP

## 2012-08-03 ENCOUNTER — Ambulatory Visit: Payer: Medicare Other | Attending: Family Medicine | Admitting: Physical Therapy

## 2012-08-03 DIAGNOSIS — M25559 Pain in unspecified hip: Secondary | ICD-10-CM | POA: Insufficient documentation

## 2012-08-03 DIAGNOSIS — M2569 Stiffness of other specified joint, not elsewhere classified: Secondary | ICD-10-CM | POA: Insufficient documentation

## 2012-08-03 DIAGNOSIS — IMO0001 Reserved for inherently not codable concepts without codable children: Secondary | ICD-10-CM | POA: Insufficient documentation

## 2012-08-16 ENCOUNTER — Encounter: Payer: Self-pay | Admitting: Family Medicine

## 2012-08-16 ENCOUNTER — Ambulatory Visit (INDEPENDENT_AMBULATORY_CARE_PROVIDER_SITE_OTHER): Payer: Medicare Other | Admitting: Family Medicine

## 2012-08-16 VITALS — BP 196/109 | HR 80 | Temp 98.1°F | Ht 62.6 in | Wt 206.0 lb

## 2012-08-16 DIAGNOSIS — L0291 Cutaneous abscess, unspecified: Secondary | ICD-10-CM

## 2012-08-16 DIAGNOSIS — E876 Hypokalemia: Secondary | ICD-10-CM

## 2012-08-16 DIAGNOSIS — E119 Type 2 diabetes mellitus without complications: Secondary | ICD-10-CM

## 2012-08-16 DIAGNOSIS — G4733 Obstructive sleep apnea (adult) (pediatric): Secondary | ICD-10-CM

## 2012-08-16 DIAGNOSIS — M7918 Myalgia, other site: Secondary | ICD-10-CM

## 2012-08-16 DIAGNOSIS — L0390 Cellulitis, unspecified: Secondary | ICD-10-CM

## 2012-08-16 DIAGNOSIS — E785 Hyperlipidemia, unspecified: Secondary | ICD-10-CM

## 2012-08-16 DIAGNOSIS — I10 Essential (primary) hypertension: Secondary | ICD-10-CM

## 2012-08-16 DIAGNOSIS — IMO0001 Reserved for inherently not codable concepts without codable children: Secondary | ICD-10-CM

## 2012-08-16 DIAGNOSIS — Z23 Encounter for immunization: Secondary | ICD-10-CM

## 2012-08-16 HISTORY — DX: Myalgia, other site: M79.18

## 2012-08-16 LAB — POCT GLYCOSYLATED HEMOGLOBIN (HGB A1C): Hemoglobin A1C: 6.8

## 2012-08-16 MED ORDER — CLONIDINE HCL 0.1 MG PO TABS
0.1000 mg | ORAL_TABLET | Freq: Once | ORAL | Status: AC
  Administered 2012-08-16: 0.1 mg via ORAL

## 2012-08-16 MED ORDER — AMLODIPINE BESYLATE 5 MG PO TABS: 5.0000 mg | ORAL_TABLET | Freq: Every day | ORAL | Status: DC

## 2012-08-16 NOTE — Assessment & Plan Note (Signed)
Benign and very self limited case NSAIDs if worsens.  No further intervention at this time

## 2012-08-16 NOTE — Assessment & Plan Note (Addendum)
Reviewed last LDL, which is at goal Repeat direct LDL today Will consider changing to pravastatin due to side effects w/ amlodipine if elevated in future  11/27 Addendum: LDL reviewed. At goal. No change

## 2012-08-16 NOTE — Assessment & Plan Note (Signed)
resolved 

## 2012-08-16 NOTE — Assessment & Plan Note (Addendum)
Returning CPAP due to insurance. Per pt Will represcribe in December when pt changes to humana OSA reduction is likely improtant component to reducing HTN

## 2012-08-16 NOTE — Progress Notes (Signed)
Kristine Boyer is a 71 y.o. female who presents to [facility] today for dizzyness w/ HA.  Dizzyness. Started after las eye exam. Associated w/ HA occasionally. No vision change or LOC, or change in mental status or szr. Has not been consistent w/ her BP medications. Denies palpitations, SOB  Chest wall pain: associated when sleeping on l side. Present when awake in am. Ache pain that is relieved w/ repositioning and rubbing her L chest. Denies palpitations, SOB, syncope  DM: BS at home from 90-110s typically. 170 times 2 at its worst.   HTN: Takes meds most days. Not wearing CPAP due to insurance. Changing back to [facility].    The following portions of the patient's history were reviewed and updated as appropriate: allergies, current medications, past medical history, family and social history, and problem list.  Patient is a former smoker  Past Medical History  Diagnosis Date  . Diabetes mellitus   . Immune deficiency disorder     ROS as above otherwise neg.    Medications reviewed. Current Outpatient Prescriptions  Medication Sig Dispense Refill  . aspirin EC 81 MG tablet Take 81 mg by mouth daily.      . calcium carbonate (OS-CAL) 600 MG TABS Take 600 mg by mouth 2 (two) times daily with a meal.      . insulin glargine (LANTUS) 100 UNIT/ML injection Inject 20-50 Units into the skin 2 (two) times daily. Take 50 units each morning and 20 units each evening  10 mL  6  . Insulin Pen Needle 31G X 5 MM MISC Use with lantus insulin pen as directed twice daily.  100 each  3  . levothyroxine (SYNTHROID, LEVOTHROID) 175 MCG tablet Take 1 tablet (175 mcg total) by mouth daily.  90 tablet  3  . metoprolol succinate (TOPROL-XL) 100 MG 24 hr tablet Take 1 tablet (100 mg total) by mouth daily.  90 tablet  1  . Multiple Vitamin (MULTIVITAMIN) tablet Take 1 tablet by mouth daily.      . pantoprazole (PROTONIX) 40 MG tablet Take 1 tablet (40 mg total) by mouth daily.  90 tablet  3  . simvastatin (ZOCOR) 40 MG  tablet Take 1 tablet (40 mg total) by mouth daily.  90 tablet  1  . valsartan (DIOVAN) 160 MG tablet Take 1 tablet (160 mg total) by mouth daily.  90 tablet  3    Exam: BP 196/109  Pulse 80  Temp 98.1 F (36.7 C) (Oral)  Ht 5' 2.6" (1.59 m)  Wt 206 lb (93.441 kg)  BMI 36.96 kg/m2 Gen: Well NAD HEENT: EOMI,  MMM Lungs: CTABL Nl WOB Heart: RRR no MRG Abd: NABS, NT, ND Neuro: CN intact, Cerebellar function intact  Results for orders placed in visit on 08/16/12 (from the past 72 hour(s))  POCT GLYCOSYLATED HEMOGLOBIN (HGB A1C)     Status: Normal   Collection Time   08/16/12  2:52 PM      Component Value Range Comment   Hemoglobin A1C 6.8

## 2012-08-16 NOTE — Assessment & Plan Note (Addendum)
No taking Kdur BMP today No paplpitations  11/27: K low at 3.4. Called and left a message for pt to continue taking K

## 2012-08-16 NOTE — Assessment & Plan Note (Addendum)
A1c.  Pt doing very well. Pt doing very well on changing diet Continue lantus

## 2012-08-16 NOTE — Patient Instructions (Addendum)
Thank you for coming in today You are doing great. Please start using yoru amlodipine Continue all of your other medications Please keep up the great work with your blood sugar Keep trying to lose weight as possible.  Please come back to see me after you get your insurance changed, within 3 months. Have an awesome day

## 2012-08-16 NOTE — Assessment & Plan Note (Signed)
Symptomatic HTN today in office.  Clonidine w/ improvement in BP and resolution of dizziness and HA.  Starting AMlodipine. Side effects explained

## 2012-08-17 LAB — BASIC METABOLIC PANEL
Chloride: 104 mEq/L (ref 96–112)
Creat: 1.08 mg/dL (ref 0.50–1.10)
Potassium: 3.4 mEq/L — ABNORMAL LOW (ref 3.5–5.3)

## 2012-08-17 LAB — LDL CHOLESTEROL, DIRECT: Direct LDL: 85 mg/dL

## 2012-12-01 ENCOUNTER — Encounter: Payer: Self-pay | Admitting: Family Medicine

## 2012-12-01 ENCOUNTER — Ambulatory Visit (INDEPENDENT_AMBULATORY_CARE_PROVIDER_SITE_OTHER): Payer: Medicare HMO | Admitting: Family Medicine

## 2012-12-01 VITALS — BP 150/85 | HR 80 | Temp 98.3°F | Ht 62.6 in | Wt 208.0 lb

## 2012-12-01 DIAGNOSIS — I10 Essential (primary) hypertension: Secondary | ICD-10-CM

## 2012-12-01 DIAGNOSIS — E039 Hypothyroidism, unspecified: Secondary | ICD-10-CM

## 2012-12-01 DIAGNOSIS — E119 Type 2 diabetes mellitus without complications: Secondary | ICD-10-CM

## 2012-12-01 DIAGNOSIS — R222 Localized swelling, mass and lump, trunk: Secondary | ICD-10-CM

## 2012-12-01 DIAGNOSIS — L304 Erythema intertrigo: Secondary | ICD-10-CM | POA: Insufficient documentation

## 2012-12-01 DIAGNOSIS — R918 Other nonspecific abnormal finding of lung field: Secondary | ICD-10-CM

## 2012-12-01 MED ORDER — AMLODIPINE BESYLATE 5 MG PO TABS: 5.0000 mg | ORAL_TABLET | Freq: Every day | ORAL | Status: DC

## 2012-12-01 MED ORDER — PANTOPRAZOLE SODIUM 40 MG PO TBEC: 40.0000 mg | DELAYED_RELEASE_TABLET | Freq: Every day | ORAL | Status: DC

## 2012-12-01 MED ORDER — SIMVASTATIN 40 MG PO TABS: 40.0000 mg | ORAL_TABLET | Freq: Every day | ORAL | Status: DC

## 2012-12-01 MED ORDER — VALSARTAN 160 MG PO TABS: 160.0000 mg | ORAL_TABLET | Freq: Every day | ORAL | Status: DC

## 2012-12-01 MED ORDER — FLUCONAZOLE 150 MG PO TABS: 150.0000 mg | ORAL_TABLET | ORAL | Status: DC

## 2012-12-01 MED ORDER — METOPROLOL SUCCINATE ER 100 MG PO TB24: 100.0000 mg | ORAL_TABLET | Freq: Every day | ORAL | Status: DC

## 2012-12-01 MED ORDER — LEVOTHYROXINE SODIUM 175 MCG PO TABS: 175.0000 ug | ORAL_TABLET | Freq: Every day | ORAL | Status: DC

## 2012-12-01 NOTE — Patient Instructions (Addendum)
You have a fungal skin infection. Please take the fluconazole once a week for 6 weeks Please use hydrocortisone cream to help with the itch Please come in to get your labwork drawn adn then call the hospital to schedule your CT Please come back to see me in 4 weeks. I have refilled all of their other medicines.   Intertrigo Intertrigo is a skin condition that occurs in between folds of skin in places on the body that rub together a lot and do not get much ventilation. It is caused by heat, moisture, friction, sweat retention, and lack of air circulation, which produces red, irritated patches and, sometimes, scaling or drainage. People who have diabetes, who are obese, or who have treatment with antibiotics are at increased risk for intertrigo. The most common sites for intertrigo to occur include:  The groin.  The breasts.  The armpits.  Folds of abdominal skin.  Webbed spaces between the fingers or toes. Intertrigo may be aggravated by:  Sweat.  Feces.  Yeast or bacteria that are present near skin folds.  Urine.  Vaginal discharge. HOME CARE INSTRUCTIONS  The following steps can be taken to reduce friction and keep the affected area cool and dry:  Expose skin folds to the air.  Keep deep skin folds separated with cotton or linen cloth. Avoid tight fitting clothing that could cause chafing.  Wear open-toed shoes or sandals to help reduce moisture between the toes.  Apply absorbent powders to affected areas as directed by your caregiver.  Apply over-the-counter barrier pastes, such as zinc oxide, as directed by your caregiver.  If you develop a fungal infection in the affected area, your caregiver may have you use antifungal creams. SEEK MEDICAL CARE IF:   The rash is not improving after 1 week of treatment.  The rash is getting worse (more red, more swollen, more painful, or spreading).  You have a fever or chills. MAKE SURE YOU:   Understand these  instructions.  Will watch your condition.  Will get help right away if you are not doing well or get worse. Document Released: 09/14/2005 Document Revised: 12/07/2011 Document Reviewed: 02/27/2010 [facility] Patient Information 2013 [place], [place].

## 2012-12-01 NOTE — Assessment & Plan Note (Addendum)
Not taking medications Refill AMlodipine and Valsartan and metop

## 2012-12-01 NOTE — Assessment & Plan Note (Addendum)
Diflucan 15omg Qwkly for 6 wks Topical Hydrocortisone cream

## 2012-12-01 NOTE — Assessment & Plan Note (Addendum)
Will repeat CT to evaluate lung mass CMET to evaluate renal function

## 2012-12-01 NOTE — Progress Notes (Signed)
Kristine Boyer is a 72 y.o. female who presents to [facility] today for   HTN: Ran out of blood pressure medicine for more than 1 week. CP, SOB, palpitations, HA  Reflux: typically after meals. Wakes up at night. Symptoms started after running out of protonix  Rash : started in private area 7 days ago. Does not shave. Very itchy and mildly painful. Not sexually active. In the labial area, below belly and under breasts   Lung mass: 12 months since previous CT. Denies fevers, night sweats unintentional wt loss. H/o tobacco use.   The following portions of the patient's history were reviewed and updated as appropriate: allergies, current medications, past medical history, family and social history, and problem list.  Patient is a nonsmoker.  Past Medical History  Diagnosis Date  . Diabetes mellitus   . Immune deficiency disorder     ROS as above otherwise neg.    Medications reviewed. Current Outpatient Prescriptions  Medication Sig Dispense Refill  . amLODipine (NORVASC) 5 MG tablet Take 1 tablet (5 mg total) by mouth daily.  30 tablet  3  . aspirin EC 81 MG tablet Take 81 mg by mouth daily.      . calcium carbonate (OS-CAL) 600 MG TABS Take 600 mg by mouth 2 (two) times daily with a meal.      . insulin glargine (LANTUS) 100 UNIT/ML injection Inject 20-50 Units into the skin 2 (two) times daily. Take 50 units each morning and 20 units each evening  10 mL  6  . Insulin Pen Needle 31G X 5 MM MISC Use with lantus insulin pen as directed twice daily.  100 each  3  . levothyroxine (SYNTHROID, LEVOTHROID) 175 MCG tablet Take 1 tablet (175 mcg total) by mouth daily.  90 tablet  3  . metoprolol succinate (TOPROL-XL) 100 MG 24 hr tablet Take 1 tablet (100 mg total) by mouth daily.  90 tablet  1  . Multiple Vitamin (MULTIVITAMIN) tablet Take 1 tablet by mouth daily.      . pantoprazole (PROTONIX) 40 MG tablet Take 1 tablet (40 mg total) by mouth daily.  90 tablet  3  . simvastatin (ZOCOR) 40 MG tablet  Take 1 tablet (40 mg total) by mouth daily.  90 tablet  1  . valsartan (DIOVAN) 160 MG tablet Take 1 tablet (160 mg total) by mouth daily.  90 tablet  3   No current facility-administered medications for this visit.    Exam: BP 172/91  Pulse 80  Temp(Src) 98.3 F (36.8 C) (Oral)  Ht 5' 2.6" (1.59 m)  Wt 208 lb (94.348 kg)  BMI 37.32 kg/m2 Gen: Well NAD HEENT: EOMI,  MMM Lungs: CTABL Nl WOB Heart: RRR no MRG Abd: NABS, NT, ND SKin: Hyperpigmented scaly skin w/ some erythema on the inferior aspect of her pannus extending into the groin region. Similar presentation on underside of breasts Exts: Non edematous BL  LE, warm and well perfused.   Results for orders placed in visit on 12/01/12 (from the past 72 hour(s))  POCT GLYCOSYLATED HEMOGLOBIN (HGB A1C)     Status: None   Collection Time    12/01/12  3:48 AM      Result Value Range   Hemoglobin A1C 7.6

## 2012-12-02 NOTE — Assessment & Plan Note (Signed)
Elevation in A1c.  Pt under sig stress lately and not adhering as well to diet If continues to be elevated will discuss titration of dose

## 2012-12-08 ENCOUNTER — Ambulatory Visit (HOSPITAL_COMMUNITY)
Admission: RE | Admit: 2012-12-08 | Discharge: 2012-12-08 | Disposition: A | Discharge status: Home / Self Care | Payer: Medicare HMO | Source: Ambulatory Visit | Attending: Family Medicine | Admitting: Family Medicine

## 2012-12-08 DIAGNOSIS — R918 Other nonspecific abnormal finding of lung field: Secondary | ICD-10-CM | POA: Insufficient documentation

## 2012-12-08 DIAGNOSIS — Z09 Encounter for follow-up examination after completed treatment for conditions other than malignant neoplasm: Secondary | ICD-10-CM | POA: Insufficient documentation

## 2012-12-08 LAB — COMPREHENSIVE METABOLIC PANEL
ALT: 10 U/L (ref 0–35)
AST: 20 U/L (ref 0–37)
Alkaline Phosphatase: 114 U/L (ref 39–117)
GFR calc Af Amer: 69 mL/min — ABNORMAL LOW (ref >90)
Glucose, Bld: 79 mg/dL (ref 70–99)
Potassium: 3.5 mEq/L (ref 3.5–5.1)
Sodium: 142 mEq/L (ref 135–145)
Total Protein: 7.8 g/dL (ref 6.0–8.3)

## 2012-12-08 MED ORDER — IOHEXOL 300 MG/ML  SOLN
80.0000 mL | Freq: Once | INTRAMUSCULAR | Status: AC | PRN
  Administered 2012-12-08: 80 mL via INTRAVENOUS

## 2012-12-12 ENCOUNTER — Telehealth: Payer: Self-pay | Admitting: Family Medicine

## 2012-12-12 DIAGNOSIS — R918 Other nonspecific abnormal finding of lung field: Secondary | ICD-10-CM

## 2012-12-12 NOTE — Telephone Encounter (Signed)
Called and informed of lab and CT results. Will need f/u CT in November.   Shelly Flatten, MD Family Medicine PGY-2 12/12/2012, 12:01 PM

## 2012-12-16 ENCOUNTER — Other Ambulatory Visit: Payer: Self-pay | Admitting: Family Medicine

## 2012-12-16 ENCOUNTER — Telehealth: Payer: Self-pay | Admitting: *Deleted

## 2012-12-16 DIAGNOSIS — E119 Type 2 diabetes mellitus without complications: Secondary | ICD-10-CM

## 2012-12-16 MED ORDER — INSULIN GLARGINE 100 UNIT/ML ~~LOC~~ SOLN: 20.0000 [IU] | Freq: Two times a day (BID) | SUBCUTANEOUS | Status: DC

## 2012-12-16 NOTE — Telephone Encounter (Signed)
Called patient to notify her and she requests insulin pens and she wants 3 month supply.  . I called pharmacy and ask them to change to the lantus pens and give 3 months supply.  Will send to Dr. Konrad Dolores to ask him to please enter  this in as the Lantus Solostar pen .

## 2012-12-16 NOTE — Telephone Encounter (Signed)
Patient calls stating she has been out of insulin for 2 weeks.. Rx sent electronically to pharmacy.

## 2012-12-22 MED ORDER — INSULIN GLARGINE 100 UNITS/ML SOLOSTAR PEN: PEN_INJECTOR | SUBCUTANEOUS | Status: DC

## 2013-01-03 ENCOUNTER — Encounter: Payer: Self-pay | Admitting: Family Medicine

## 2013-01-03 ENCOUNTER — Encounter: Payer: Self-pay | Admitting: Gastroenterology

## 2013-01-03 ENCOUNTER — Telehealth: Payer: Self-pay | Admitting: Family Medicine

## 2013-01-03 ENCOUNTER — Ambulatory Visit (INDEPENDENT_AMBULATORY_CARE_PROVIDER_SITE_OTHER): Payer: Medicare HMO | Admitting: Family Medicine

## 2013-01-03 VITALS — BP 151/81 | HR 72 | Wt 211.0 lb

## 2013-01-03 DIAGNOSIS — Z1239 Encounter for other screening for malignant neoplasm of breast: Secondary | ICD-10-CM

## 2013-01-03 DIAGNOSIS — L0292 Furuncle, unspecified: Secondary | ICD-10-CM

## 2013-01-03 DIAGNOSIS — R928 Other abnormal and inconclusive findings on diagnostic imaging of breast: Secondary | ICD-10-CM

## 2013-01-03 DIAGNOSIS — N63 Unspecified lump in unspecified breast: Secondary | ICD-10-CM

## 2013-01-03 DIAGNOSIS — L0293 Carbuncle, unspecified: Secondary | ICD-10-CM

## 2013-01-03 NOTE — Assessment & Plan Note (Signed)
Left axillary boil drained.  No frank  pus, but loculations broken up.  No cellulitis.  Advised supportive care and discussed red flags for follow-up

## 2013-01-03 NOTE — Assessment & Plan Note (Signed)
Patient reported nodule on left breast.  Overdue for screening mammogram.  Will order screening mammo with diagnostic left breast for further evaluation.

## 2013-01-03 NOTE — Telephone Encounter (Signed)
Patient is calling because she has a boil over the area where he lanced the boil she had before.  She said it is like a cluster.  She wants to know if it is ok to get Boil Ease or if she needs to come in.  She said this is bigger than the one she had before.

## 2013-01-03 NOTE — Telephone Encounter (Signed)
Pt has appt today.  MD will discuss then.  Fleeger, Maryjo [place]

## 2013-01-03 NOTE — Patient Instructions (Addendum)
Wash area twice a day with antibacterial soap and water  Use warm compresses for 10 minutes 3-4 times a day to keep area open  Follow-up if does not improve or your notice signs of infection such as redness, worsening pain  Keep appointment for mammogram to check on area of your breast you notice lump

## 2013-01-03 NOTE — Progress Notes (Signed)
  Subjective:    Patient ID: Kristine Boyer, female    DOB: [birth year], 72 y.o.   MRN: [id number]  HPI  72 yo here for eval of left axillary "boil"  2 weeks in duration.  States this is her second boil.  Had previously had one lanced in similar area at her last appointment per patient.   Reports she has put some fatty meat on it to help it come to a head, but is now no longer draining.  Very tender.  No fever, chills.   Also reports nodule on left breast- present for a year, not changing.  I have reviewed patient's  PMH, FH, and Social history and Medications as related to this visit. diabetes  Review of Systems See HPI    Objective:   Physical Exam GEN: NAD approx 0.5 x 1 cm induration without cellulitis under left axiila  Procedure:  Incision and drainage of abscess Risks, benefits, and alternatives explained and consent obtained. Time out conducted. Surface cleaned with betadine and alcohol. 3 cc lidocaine with epinephine infiltrated around abscess. Adequate anesthesia ensured. Area prepped and draped in a sterile fashion. #15 blade used to make a stab incision into abscess. No pus expressed with pressure. Curved hemostat used to explore 4 quadrants and loculations broken up. Further purulence expressed.  NO packing Hemostasis achieved. Pt stable. Aftercare and follow-up advised.   Breast; Left breast  ?fibrocystic changes left breast 12 oclock, no discrete nodules felt.  Right breast wnl.       Assessment & Plan:

## 2013-01-16 ENCOUNTER — Ambulatory Visit
Admission: RE | Admit: 2013-01-16 | Discharge: 2013-01-16 | Disposition: A | Discharge status: Home / Self Care | Payer: Medicare HMO | Source: Ambulatory Visit | Attending: Family Medicine | Admitting: Family Medicine

## 2013-01-16 ENCOUNTER — Encounter: Payer: Self-pay | Admitting: Family Medicine

## 2013-01-16 DIAGNOSIS — N63 Unspecified lump in unspecified breast: Secondary | ICD-10-CM

## 2013-02-14 ENCOUNTER — Encounter: Payer: Self-pay | Admitting: Family Medicine

## 2013-02-14 ENCOUNTER — Ambulatory Visit (INDEPENDENT_AMBULATORY_CARE_PROVIDER_SITE_OTHER): Payer: Medicare HMO | Admitting: Family Medicine

## 2013-02-14 VITALS — BP 139/85 | HR 67 | Ht 62.5 in | Wt 207.2 lb

## 2013-02-14 DIAGNOSIS — L304 Erythema intertrigo: Secondary | ICD-10-CM

## 2013-02-14 DIAGNOSIS — E669 Obesity, unspecified: Secondary | ICD-10-CM

## 2013-02-14 DIAGNOSIS — M7989 Other specified soft tissue disorders: Secondary | ICD-10-CM

## 2013-02-14 DIAGNOSIS — L538 Other specified erythematous conditions: Secondary | ICD-10-CM

## 2013-02-14 DIAGNOSIS — E119 Type 2 diabetes mellitus without complications: Secondary | ICD-10-CM

## 2013-02-14 DIAGNOSIS — I10 Essential (primary) hypertension: Secondary | ICD-10-CM

## 2013-02-14 HISTORY — DX: Other specified soft tissue disorders: M79.89

## 2013-02-14 NOTE — Assessment & Plan Note (Signed)
Well controlled, continue current therapy May need to stop Amlodipine in the future if LE edema does not improve

## 2013-02-14 NOTE — Assessment & Plan Note (Signed)
Resolved after therapy w/ diflucan

## 2013-02-14 NOTE — Assessment & Plan Note (Signed)
Wt down 4lb since starting diet. Continue and start exercising  Encouraged pt to continue, pt very motivated

## 2013-02-14 NOTE — Assessment & Plan Note (Addendum)
Likely venous stasis.  Compression hose Exercise and wt loss.  May need to DC amlodipine No signs/symptoms of heart failure, liver, or renal failure

## 2013-02-14 NOTE — Patient Instructions (Addendum)
You are doing great adn are a blessing to visit with Please continue with your diet and exercise.  The fluid in your ankles may improve on its own or we may have to decrease Amlodipine. . Also try to decrease how much water you are drinking and avoid high salt foods Repeat your mammogram in 6 months Come back to see me in early July Continue taking your insulin as prescribed.

## 2013-02-14 NOTE — Progress Notes (Signed)
Kristine Boyer is a 72 y.o. female who presents to [facility] today for f/u  Wt loss. On an ocra and water diet. Feeling well. Minimal exercising secondary to foot pain from previous fracture, but will start walking w/ family members.   Rash: cleared since starting the diflucan  DM: Morning glucose around 99-166. 30 units of lantus in the am and ~20 at night based on glucose level.  Leg swelling: Started about 2-3 wks ago after starting the okra and water diet (still eats regular meals just added okra and significant increase in water. Denies SOB, CP, dyspnea, orthopnea, palpitations, scleral icterus.   The following portions of the patient's history were reviewed and updated as appropriate: allergies, current medications, past medical history, family and social history, and problem list.  Patient is a nonsmoker.   Past Medical History  Diagnosis Date  . Diabetes mellitus   . Immune deficiency disorder     ROS as above otherwise neg.    Medications reviewed. Current Outpatient Prescriptions  Medication Sig Dispense Refill  . amLODipine (NORVASC) 5 MG tablet Take 1 tablet (5 mg total) by mouth daily.  90 tablet  3  . aspirin EC 81 MG tablet Take 81 mg by mouth daily.      . calcium carbonate (OS-CAL) 600 MG TABS Take 600 mg by mouth 2 (two) times daily with a meal.      . fluconazole (DIFLUCAN) 150 MG tablet Take 1 tablet (150 mg total) by mouth once a week.  6 tablet  0  . insulin glargine (LANTUS) 100 units/mL SOLN Inject 50 units each morning and 20 units each evening,  3 mL  11  . Insulin Pen Needle 31G X 5 MM MISC Use with lantus insulin pen as directed twice daily.  100 each  3  . levothyroxine (SYNTHROID, LEVOTHROID) 175 MCG tablet Take 1 tablet (175 mcg total) by mouth daily.  90 tablet  3  . metoprolol succinate (TOPROL-XL) 100 MG 24 hr tablet Take 1 tablet (100 mg total) by mouth daily.  90 tablet  3  . Multiple Vitamin (MULTIVITAMIN) tablet Take 1 tablet by mouth daily.      .  pantoprazole (PROTONIX) 40 MG tablet Take 1 tablet (40 mg total) by mouth daily.  90 tablet  3  . simvastatin (ZOCOR) 40 MG tablet Take 1 tablet (40 mg total) by mouth daily.  90 tablet  3  . valsartan (DIOVAN) 160 MG tablet Take 1 tablet (160 mg total) by mouth daily.  90 tablet  3   No current facility-administered medications for this visit.    Exam: BP 139/85  Pulse 67  Ht 5' 2.5" (1.588 m)  Wt 207 lb 3.2 oz (93.985 kg)  BMI 37.27 kg/m2 Gen: Well NAD HEENT: EOMI,  MMM Exts: 1+ ankle and LE edema to mid calf.  Skin: warm, well perfused, intact  No results found for this or any previous visit (from the past 72 hour(s)).

## 2013-02-14 NOTE — Assessment & Plan Note (Signed)
Pt taking excellent care of herself.  CBG are the best I've seen in a while.  Pt to continue using lantus as prescribed adn to continue managing diet

## 2013-03-10 ENCOUNTER — Ambulatory Visit (INDEPENDENT_AMBULATORY_CARE_PROVIDER_SITE_OTHER): Payer: Medicare HMO | Admitting: Family Medicine

## 2013-03-10 VITALS — BP 138/78 | HR 75 | Wt 209.0 lb

## 2013-03-10 DIAGNOSIS — E785 Hyperlipidemia, unspecified: Secondary | ICD-10-CM

## 2013-03-10 DIAGNOSIS — M7989 Other specified soft tissue disorders: Secondary | ICD-10-CM

## 2013-03-10 DIAGNOSIS — I10 Essential (primary) hypertension: Secondary | ICD-10-CM

## 2013-03-10 DIAGNOSIS — E119 Type 2 diabetes mellitus without complications: Secondary | ICD-10-CM

## 2013-03-10 MED ORDER — INSULIN PEN NEEDLE 31G X 5 MM MISC: Status: DC

## 2013-03-10 MED ORDER — ATORVASTATIN CALCIUM 40 MG PO TABS: 40.0000 mg | ORAL_TABLET | Freq: Every day | ORAL | Status: DC

## 2013-03-10 MED ORDER — MEDICAL COMPRESSION STOCKINGS MISC: 1.0000 [IU] | Freq: Every day | Status: DC | PRN

## 2013-03-10 MED ORDER — METOPROLOL SUCCINATE ER 100 MG PO TB24: 100.0000 mg | ORAL_TABLET | Freq: Every day | ORAL | Status: DC

## 2013-03-10 MED ORDER — METOPROLOL SUCCINATE ER 100 MG PO TB24: 200.0000 mg | ORAL_TABLET | Freq: Every day | ORAL | Status: DC

## 2013-03-10 NOTE — Assessment & Plan Note (Addendum)
Excellent reading today Stopping amlodipine due swelling Pt to take at home and return in 2 wks for check Will decide at that time to either increase dose of metop or keep same.

## 2013-03-10 NOTE — Patient Instructions (Addendum)
I think your leg swelling is from the amlodipine Please stop this medicine Please go back to taking Lantus 50 units in the evening and 20 units in the morning Please start using the compression stokings as needed for the swelling in your legs Please check your blood pressure regularly and report back yoru numbers Please come back in 2 weeks for a blood pressure check with the nurse.  Please come back to see me in 1 month. Have a blessed weekend Please start taking lipitor

## 2013-03-10 NOTE — Assessment & Plan Note (Signed)
Not taking lantus as prescrived A1c elevated again Instructed pt to go back to 50 in the am and 20 in the pm

## 2013-03-10 NOTE — Progress Notes (Signed)
Kristine Boyer is a 72 y.o. female who presents to [facility] today for SD for foot swelling  Started a several months ago. Denies SOb, CP, syncope, palpitaitons, fevers. Improves w/ tight clothing (shoes or socks).   Rash: resolved  HTN: taking amlodipine , valsartan adn metoprolol.    DM: only taking lantus 30 units in the morning  20 units in the evening.   The following portions of the patient's history were reviewed and updated as appropriate: allergies, current medications, past medical history, family and social history, and problem list.  Patient is a nonsmoker.  Past Medical History  Diagnosis Date  . Diabetes mellitus   . Immune deficiency disorder     ROS as above otherwise neg.    Medications reviewed. Current Outpatient Prescriptions  Medication Sig Dispense Refill  . amLODipine (NORVASC) 5 MG tablet Take 1 tablet (5 mg total) by mouth daily.  90 tablet  3  . aspirin EC 81 MG tablet Take 81 mg by mouth daily.      . calcium carbonate (OS-CAL) 600 MG TABS Take 600 mg by mouth 2 (two) times daily with a meal.      . fluconazole (DIFLUCAN) 150 MG tablet Take 1 tablet (150 mg total) by mouth once a week.  6 tablet  0  . insulin glargine (LANTUS) 100 units/mL SOLN Inject 50 units each morning and 20 units each evening,  3 mL  11  . Insulin Pen Needle 31G X 5 MM MISC Use with lantus insulin pen as directed twice daily.  100 each  3  . levothyroxine (SYNTHROID, LEVOTHROID) 175 MCG tablet Take 1 tablet (175 mcg total) by mouth daily.  90 tablet  3  . metoprolol succinate (TOPROL-XL) 100 MG 24 hr tablet Take 1 tablet (100 mg total) by mouth daily.  90 tablet  3  . Multiple Vitamin (MULTIVITAMIN) tablet Take 1 tablet by mouth daily.      . pantoprazole (PROTONIX) 40 MG tablet Take 1 tablet (40 mg total) by mouth daily.  90 tablet  3  . simvastatin (ZOCOR) 40 MG tablet Take 1 tablet (40 mg total) by mouth daily.  90 tablet  3  . valsartan (DIOVAN) 160 MG tablet Take 1 tablet (160 mg  total) by mouth daily.  90 tablet  3   No current facility-administered medications for this visit.    Exam: BP 168/86  Pulse 75  Wt 209 lb (94.802 kg)  BMI 37.59 kg/m2 Gen: Well NAD HEENT: EOMI,  MMM Lungs: CTABL Nl WOB Heart: no JVD Ext: 1+ LE pitting edema, 2+ pulses.   No results found for this or any previous visit (from the past 72 hour(s)).

## 2013-03-10 NOTE — Assessment & Plan Note (Signed)
Likely secondary to venous stasis and amlodipine Stop amlodipine Compression stockings Encouraged diet and exercise for wt loss.

## 2013-04-06 ENCOUNTER — Other Ambulatory Visit: Payer: Self-pay

## 2013-06-07 ENCOUNTER — Ambulatory Visit: Payer: Medicare HMO | Admitting: Family Medicine

## 2013-07-18 ENCOUNTER — Ambulatory Visit (INDEPENDENT_AMBULATORY_CARE_PROVIDER_SITE_OTHER): Payer: Medicare HMO | Admitting: Family Medicine

## 2013-07-18 ENCOUNTER — Encounter: Payer: Self-pay | Admitting: Family Medicine

## 2013-07-18 ENCOUNTER — Ambulatory Visit: Payer: Medicare HMO | Admitting: Family Medicine

## 2013-07-18 VITALS — BP 130/88 | HR 72 | Temp 98.0°F | Ht 62.5 in | Wt 209.0 lb

## 2013-07-18 DIAGNOSIS — R6889 Other general symptoms and signs: Secondary | ICD-10-CM | POA: Insufficient documentation

## 2013-07-18 DIAGNOSIS — M545 Low back pain: Secondary | ICD-10-CM

## 2013-07-18 DIAGNOSIS — IMO0001 Reserved for inherently not codable concepts without codable children: Secondary | ICD-10-CM

## 2013-07-18 DIAGNOSIS — I10 Essential (primary) hypertension: Secondary | ICD-10-CM

## 2013-07-18 DIAGNOSIS — M7918 Myalgia, other site: Secondary | ICD-10-CM

## 2013-07-18 DIAGNOSIS — E119 Type 2 diabetes mellitus without complications: Secondary | ICD-10-CM

## 2013-07-18 DIAGNOSIS — Z23 Encounter for immunization: Secondary | ICD-10-CM

## 2013-07-18 LAB — POCT URINALYSIS DIPSTICK
Bilirubin, UA: NEGATIVE
Glucose, UA: NEGATIVE
Ketones, UA: NEGATIVE
Protein, UA: 30
Spec Grav, UA: 1.015

## 2013-07-18 LAB — POCT UA - MICROSCOPIC ONLY

## 2013-07-18 NOTE — Progress Notes (Signed)
Kristine Boyer is a 72 y.o. female who presents to [facility] today for f/u  Kidney pain: present for 4wks. Denies dysuria and  Frequency. Some lower abdominal pain  Taking her metformin for constipation.   HTN: taking metoprolol and diovan. Taking boyfriends lisinopril from time to time.   DM: not taking metformin accept when constipated. Causes upset GI. Lantus 50am/20PM. Unsure of glucose at home due to inability to check w/ meter. Difficult to use.   Boils near rectum for 2 wks. Improving. Minimal pain. No discharge.   The following portions of the patient's history were reviewed and updated as appropriate: allergies, current medications, past medical history, family and social history, and problem list.  Patient is a nonsmoker  Past Medical History  Diagnosis Date  . Diabetes mellitus   . Immune deficiency disorder     ROS as above otherwise neg.    Medications reviewed. Current Outpatient Prescriptions  Medication Sig Dispense Refill  . aspirin EC 81 MG tablet Take 81 mg by mouth daily.      [name] atorvastatin (LIPITOR) 40 MG tablet Take 1 tablet (40 mg total) by mouth daily.  90 tablet  3  . calcium carbonate (OS-CAL) 600 MG TABS Take 600 mg by mouth 2 (two) times daily with a meal.      . Elastic Bandages & Supports (MEDICAL COMPRESSION STOCKINGS) MISC 1 Units by Does not apply route daily as needed.  1 each  1  . insulin glargine (LANTUS) 100 units/mL SOLN Inject 50 units each morning and 20 units each evening,  3 mL  11  . Insulin Pen Needle 31G X 5 MM MISC Use with lantus insulin pen as directed twice daily.  100 each  3  . levothyroxine (SYNTHROID, LEVOTHROID) 175 MCG tablet Take 1 tablet (175 mcg total) by mouth daily.  90 tablet  3  . metoprolol succinate (TOPROL-XL) 100 MG 24 hr tablet Take 1 tablet (100 mg total) by mouth daily.  90 tablet  3  . Multiple Vitamin (MULTIVITAMIN) tablet Take 1 tablet by mouth daily.      . pantoprazole (PROTONIX) 40 MG tablet Take 1 tablet (40 mg  total) by mouth daily.  90 tablet  3  . valsartan (DIOVAN) 160 MG tablet Take 1 tablet (160 mg total) by mouth daily.  90 tablet  3   No current facility-administered medications for this visit.    Exam:  BP 130/88  Pulse 72  Temp(Src) 98 F (36.7 C) (Oral)  Ht 5' 2.5" (1.588 m)  Wt 209 lb (94.802 kg)  BMI 37.59 kg/m2 Gen: Well NAD HEENT: EOMI,  MMM Lungs: CTABL Nl WOB Heart: RRR no MRG Abd: NABS, NT, ND Neuro: CN 2-12 grossly intact, cerebellar fxn nml  Results for orders placed in visit on 07/18/13 (from the past 72 hour(s))  POCT GLYCOSYLATED HEMOGLOBIN (HGB A1C)     Status: None   Collection Time    07/18/13  9:55 AM      Result Value Range   Hemoglobin A1C 7.7

## 2013-07-18 NOTE — Patient Instructions (Signed)
You are doing well overall Please take the metformin as needed Please come back to see me at your earliest convenience to discuss your memory Please bring in all your medications in your pill box at your next visit

## 2013-07-18 NOTE — Assessment & Plan Note (Signed)
Taking Metop, Diovan and boyfriends lisinopril Told to stop lisinopril BP at goal.  Poor historian w/ medication mgt. Pt to obtain pill box w/ help of family members

## 2013-07-18 NOTE — Assessment & Plan Note (Signed)
Kidney pain complaint today likely secondary to MSK referred pain as UA nml

## 2013-07-18 NOTE — Assessment & Plan Note (Signed)
Lab Results  Component Value Date   HGBA1C 7.7 07/18/2013   Basically unchanged from previous Does not tolerate metformin Continue lantus, may address titration at next appt but not sure if pt can comprehend.

## 2013-07-18 NOTE — Assessment & Plan Note (Signed)
Worsening problem for pt.  MMSE at next appt

## 2013-07-28 ENCOUNTER — Ambulatory Visit (INDEPENDENT_AMBULATORY_CARE_PROVIDER_SITE_OTHER): Payer: Medicare HMO | Admitting: Family Medicine

## 2013-07-28 VITALS — BP 181/96 | HR 71 | Temp 98.9°F | Ht 62.5 in | Wt 207.0 lb

## 2013-07-28 DIAGNOSIS — K5900 Constipation, unspecified: Secondary | ICD-10-CM

## 2013-07-28 DIAGNOSIS — I10 Essential (primary) hypertension: Secondary | ICD-10-CM

## 2013-07-28 DIAGNOSIS — M7918 Myalgia, other site: Secondary | ICD-10-CM

## 2013-07-28 DIAGNOSIS — IMO0001 Reserved for inherently not codable concepts without codable children: Secondary | ICD-10-CM

## 2013-07-28 MED ORDER — CYCLOBENZAPRINE HCL 5 MG PO TABS: 5.0000 mg | ORAL_TABLET | Freq: Three times a day (TID) | ORAL | Status: DC | PRN

## 2013-07-28 MED ORDER — MELOXICAM 7.5 MG PO TABS: 7.5000 mg | ORAL_TABLET | Freq: Every day | ORAL | Status: DC

## 2013-07-28 MED ORDER — POLYETHYLENE GLYCOL 3350 17 GM/SCOOP PO POWD: 17.0000 g | Freq: Two times a day (BID) | ORAL | Status: DC | PRN

## 2013-07-28 MED ORDER — KETOROLAC TROMETHAMINE 60 MG/2ML IM SOLN
60.0000 mg | Freq: Once | INTRAMUSCULAR | Status: AC
  Administered 2013-07-28: 60 mg via INTRAMUSCULAR

## 2013-07-28 NOTE — Progress Notes (Signed)
Kristine Boyer is a 72 y.o. female who presents to [facility] today for side pain.   Lower abdominal and flank pain: Daily BM. Hurts worse w/ BM. Hard stool. Minimal improvement w/ pain pill. Located in LLQ w/ radiation to L flank and back. Tolerating PO. No fever. Improves w/ exercise. No significant changes to diet recently and no recent travel. No dysuria or frequency. Onset several weeks ago and thinkks this all started from exercises in pool. Acute onset.   HTN: did not take meds today and in pain.   Constipation: w/o metformin will go 3-4 days w/o BM. Hard. Uses metformin for BMs.   The following portions of the patient's history were reviewed and updated as appropriate: allergies, current medications, past medical history, family and social history, and problem list.  Patient is a nonsmoker.  Past Medical History  Diagnosis Date  . Diabetes mellitus   . Immune deficiency disorder     ROS as above otherwise neg.    Medications reviewed. Current Outpatient Prescriptions  Medication Sig Dispense Refill  . aspirin EC 81 MG tablet Take 81 mg by mouth daily.      [name] atorvastatin (LIPITOR) 40 MG tablet Take 1 tablet (40 mg total) by mouth daily.  90 tablet  3  . calcium carbonate (OS-CAL) 600 MG TABS Take 600 mg by mouth 2 (two) times daily with a meal.      . Elastic Bandages & Supports (MEDICAL COMPRESSION STOCKINGS) MISC 1 Units by Does not apply route daily as needed.  1 each  1  . insulin glargine (LANTUS) 100 units/mL SOLN Inject 50 units each morning and 20 units each evening,  3 mL  11  . Insulin Pen Needle 31G X 5 MM MISC Use with lantus insulin pen as directed twice daily.  100 each  3  . levothyroxine (SYNTHROID, LEVOTHROID) 175 MCG tablet Take 1 tablet (175 mcg total) by mouth daily.  90 tablet  3  . metoprolol succinate (TOPROL-XL) 100 MG 24 hr tablet Take 1 tablet (100 mg total) by mouth daily.  90 tablet  3  . Multiple Vitamin (MULTIVITAMIN) tablet Take 1 tablet by mouth daily.       . pantoprazole (PROTONIX) 40 MG tablet Take 1 tablet (40 mg total) by mouth daily.  90 tablet  3  . valsartan (DIOVAN) 160 MG tablet Take 1 tablet (160 mg total) by mouth daily.  90 tablet  3   No current facility-administered medications for this visit.    Exam: BP 181/96  Pulse 71  Ht 5' 2.5" (1.588 m)  Wt 207 lb (93.895 kg)  BMI 37.23 kg/m2 Gen: Well NAD HEENT: EOMI,  MMM Lungs: CTABL Nl WOB Heart: RRR no MRG Abd: NABS, NT, ND, no stool burden MSK: slow to lie down and sit up. Flank and rectus abdominal muscle pain on palpation and w/ movement. FROM. Hip flexion w/o pain bilat.  Exts: Non edematous BL  LE, warm and well perfused.   No results found for this or any previous visit (from the past 72 hour(s)).

## 2013-07-28 NOTE — Addendum Note (Signed)
Addended by: Jone Baseman D on: 07/28/2013 10:12 AM   Modules accepted: Orders

## 2013-07-28 NOTE — Assessment & Plan Note (Signed)
Elevated today./ In pain and did not take meds Monitor. No changes

## 2013-07-28 NOTE — Assessment & Plan Note (Signed)
Starting miralax

## 2013-07-28 NOTE — Assessment & Plan Note (Signed)
Truncal muscle weakness w/ spasm Exercises given Toradol in office Meloxicam starting tomorrow Flexeril F/u in 2-4 weeks

## 2013-07-28 NOTE — Patient Instructions (Addendum)
Thank you for coming in today You are doing well overall Your pain will improve with rest, exercises and medications Please take the flexeril daily Start the meloxicam tomorrow Please come back in 2-4 weeks.  Please start the miralax twice daily for the constipation.   Flank Pain Flank pain is pain in your side. The flank is the area of your side between your upper belly (abdomen) and your back. Pain in this area can be caused by many different things. HOME CARE Home care and treatment will depend on the cause of your pain.  Rest as told by your doctor.  Drink enough fluids to keep your pee (urine) clear or pale yellow.  Only take medicine as told by your doctor.  Tell your doctor about any changes in your pain.  Follow up with your doctor. GET HELP RIGHT AWAY IF:   Your pain does not get better with medicine.   You have new symptoms or your symptoms get worse.  Your pain gets worse.   You have belly (abdominal) pain.   You are short of breath.   You always feel sick to your stomach (nauseous).   You keep throwing up (vomiting).   You have puffiness (swelling) in your belly.   You feel lightheaded or you pass out (faint).   You have blood in your pee.  You have a fever or lasting symptoms for more than 2 3 days.  You have a fever and your symptoms suddenly get worse. MAKE SURE YOU:   Understand these instructions.  Will watch your condition.  Will get help right away if you are not doing well or get worse. Document Released: 06/23/2008 Document Revised: 06/08/2012 Document Reviewed: 04/28/2012 [facility] Patient Information 2014 [place], [place].

## 2013-08-04 ENCOUNTER — Encounter: Payer: Self-pay | Admitting: Family Medicine

## 2013-08-04 ENCOUNTER — Ambulatory Visit (INDEPENDENT_AMBULATORY_CARE_PROVIDER_SITE_OTHER): Payer: Medicare HMO | Admitting: Family Medicine

## 2013-08-04 VITALS — BP 170/100 | HR 72 | Temp 97.9°F | Ht 62.5 in | Wt 208.0 lb

## 2013-08-04 DIAGNOSIS — I10 Essential (primary) hypertension: Secondary | ICD-10-CM

## 2013-08-04 DIAGNOSIS — M545 Low back pain, unspecified: Secondary | ICD-10-CM

## 2013-08-04 DIAGNOSIS — E039 Hypothyroidism, unspecified: Secondary | ICD-10-CM

## 2013-08-04 DIAGNOSIS — N63 Unspecified lump in unspecified breast: Secondary | ICD-10-CM

## 2013-08-04 DIAGNOSIS — IMO0001 Reserved for inherently not codable concepts without codable children: Secondary | ICD-10-CM

## 2013-08-04 DIAGNOSIS — M7918 Myalgia, other site: Secondary | ICD-10-CM

## 2013-08-04 DIAGNOSIS — R928 Other abnormal and inconclusive findings on diagnostic imaging of breast: Secondary | ICD-10-CM

## 2013-08-04 LAB — TSH: TSH: 2.867 u[IU]/mL (ref 0.350–4.500)

## 2013-08-04 NOTE — Assessment & Plan Note (Signed)
Will send for f/u US as previous showed  1. Follow-up left axillary ultrasound is suggested in 6 months to  assess the lymph nodes.  2. Persistent thickened cortices may warrant ultrasound-guided  core biopsy. However, I would expect improvement following  improvement of axillary infection.  3. If the patient continues to have infections in the left axilla,  Dermatology referral could be considered.  Pt w/ continued mass,

## 2013-08-04 NOTE — Assessment & Plan Note (Addendum)
TSH today  Lab Results  Component Value Date   TSH 2.867 08/04/2013   At goal. No change

## 2013-08-04 NOTE — Progress Notes (Signed)
Kristine Boyer is a 72 y.o. female who presents to [facility] today for Hip pain  Hip pain: present since fall at [facility] in 1989. Unable to pick up flexeril yet. Will do so this week. Toradol helped signicantly. Meloxicam w/ only mild benefit. Worse w/ certain movements. Worse after exercises but felt like she over did it. H/o PT several years ago w/ benefit. Heat and massage w/ benefit.   HT: In pain and stressed today as tried to make it in for early appt.   Breast nodule: no recent mammography. L Breast lump. No discharge.   THyroid: No palpitations, cold or heat intolerance. Takes meds.    The following portions of the patient's history were reviewed and updated as appropriate: allergies, current medications, past medical history, family and social history, and problem list.  Patient is a nonsmoker.   Past Medical History  Diagnosis Date  . Diabetes mellitus   . Immune deficiency disorder     ROS as above otherwise neg.    Medications reviewed. Current Outpatient Prescriptions  Medication Sig Dispense Refill  . aspirin EC 81 MG tablet Take 81 mg by mouth daily.      [name] atorvastatin (LIPITOR) 40 MG tablet Take 1 tablet (40 mg total) by mouth daily.  90 tablet  3  . calcium carbonate (OS-CAL) 600 MG TABS Take 600 mg by mouth 2 (two) times daily with a meal.      . cyclobenzaprine (FLEXERIL) 5 MG tablet Take 1-2 tablets (5-10 mg total) by mouth 3 (three) times daily as needed for muscle spasms.  30 tablet  1  . Elastic Bandages & Supports (MEDICAL COMPRESSION STOCKINGS) MISC 1 Units by Does not apply route daily as needed.  1 each  1  . insulin glargine (LANTUS) 100 units/mL SOLN Inject 50 units each morning and 20 units each evening,  3 mL  11  . Insulin Pen Needle 31G X 5 MM MISC Use with lantus insulin pen as directed twice daily.  100 each  3  . levothyroxine (SYNTHROID, LEVOTHROID) 175 MCG tablet Take 1 tablet (175 mcg total) by mouth daily.  90 tablet  3  . meloxicam (MOBIC) 7.5 MG  tablet Take 1-2 tablets (7.5-15 mg total) by mouth daily.  30 tablet  1  . metoprolol succinate (TOPROL-XL) 100 MG 24 hr tablet Take 1 tablet (100 mg total) by mouth daily.  90 tablet  3  . Multiple Vitamin (MULTIVITAMIN) tablet Take 1 tablet by mouth daily.      . pantoprazole (PROTONIX) 40 MG tablet Take 1 tablet (40 mg total) by mouth daily.  90 tablet  3  . polyethylene glycol powder (GLYCOLAX/MIRALAX) powder Take 17 g by mouth 2 (two) times daily as needed.  3350 g  1  . valsartan (DIOVAN) 160 MG tablet Take 1 tablet (160 mg total) by mouth daily.  90 tablet  3   No current facility-administered medications for this visit.    Exam: BP 170/100  Pulse 72  Temp(Src) 97.9 F (36.6 C) (Oral)  Ht 5' 2.5" (1.588 m)  Wt 208 lb (94.348 kg)  BMI 37.41 kg/m2 Gen: Well NAD HEENT: EOMI,  MMM Breast: L breast w/ moveable well circumscribed 2x2cm lesion at the 1 O'clock position. No discharge. No axillary lymphnodes palpable.   No results found for this or any previous visit (from the past 72 hour(s)).

## 2013-08-04 NOTE — Patient Instructions (Addendum)
You are doing well overall  Please go to the breast center for further evaluation of your left breast and armpit area Please start physical therapy Please start taking 2 meloxicam every morning for your pain Please start the flexeril when able Please come back to see me in 2-4 weeks.

## 2013-08-08 ENCOUNTER — Other Ambulatory Visit: Payer: Medicare HMO

## 2013-08-08 NOTE — Assessment & Plan Note (Signed)
Elevated today. Previously at goal. Pt stressed adn in pain today. No change

## 2013-08-08 NOTE — Assessment & Plan Note (Signed)
Pt w/ numerous MSK pain complaints. Many of which stem from fall at [employer] in late 80s Pt has not started flexeril and gets some benefit from Meloxicam Pt to continue with home exercises and to start flexeril and inc meloxicam Will refer to PT again as this was very beneficial for pt in past.   

## 2013-08-08 NOTE — Assessment & Plan Note (Signed)
Pt w/ numerous MSK pain complaints. Many of which stem from fall at [facility] in late 80s Pt has not started flexeril and gets some benefit from Meloxicam Pt to continue with home exercises and to start flexeril and inc meloxicam Will refer to PT again as this was very beneficial for pt in past.

## 2013-08-09 ENCOUNTER — Ambulatory Visit
Admission: RE | Admit: 2013-08-09 | Discharge: 2013-08-09 | Disposition: A | Discharge status: Home / Self Care | Payer: Commercial Managed Care - HMO | Source: Ambulatory Visit | Attending: Family Medicine | Admitting: Family Medicine

## 2013-08-09 DIAGNOSIS — N63 Unspecified lump in unspecified breast: Secondary | ICD-10-CM

## 2013-08-16 ENCOUNTER — Ambulatory Visit: Payer: Medicare HMO | Admitting: Family Medicine

## 2013-08-28 ENCOUNTER — Encounter: Payer: Self-pay | Admitting: Family Medicine

## 2013-08-28 ENCOUNTER — Ambulatory Visit (INDEPENDENT_AMBULATORY_CARE_PROVIDER_SITE_OTHER): Payer: Medicare HMO | Admitting: Family Medicine

## 2013-08-28 VITALS — BP 165/87 | HR 92 | Temp 98.0°F | Wt 208.0 lb

## 2013-08-28 DIAGNOSIS — R109 Unspecified abdominal pain: Secondary | ICD-10-CM

## 2013-08-28 DIAGNOSIS — E039 Hypothyroidism, unspecified: Secondary | ICD-10-CM

## 2013-08-28 DIAGNOSIS — M7918 Myalgia, other site: Secondary | ICD-10-CM

## 2013-08-28 DIAGNOSIS — R1310 Dysphagia, unspecified: Secondary | ICD-10-CM

## 2013-08-28 DIAGNOSIS — I10 Essential (primary) hypertension: Secondary | ICD-10-CM

## 2013-08-28 DIAGNOSIS — IMO0001 Reserved for inherently not codable concepts without codable children: Secondary | ICD-10-CM

## 2013-08-28 HISTORY — DX: Dysphagia, unspecified: R13.10

## 2013-08-28 HISTORY — DX: Unspecified abdominal pain: R10.9

## 2013-08-28 LAB — POCT URINALYSIS DIPSTICK
Glucose, UA: NEGATIVE
Nitrite, UA: NEGATIVE
Protein, UA: 30
Spec Grav, UA: 1.025
Urobilinogen, UA: 0.2
pH, UA: 6

## 2013-08-28 LAB — TSH: TSH: 8.92 u[IU]/mL — ABNORMAL HIGH (ref 0.350–4.500)

## 2013-08-28 MED ORDER — CYCLOBENZAPRINE HCL 5 MG PO TABS: 5.0000 mg | ORAL_TABLET | Freq: Three times a day (TID) | ORAL | Status: DC | PRN

## 2013-08-28 MED ORDER — METOPROLOL SUCCINATE ER 200 MG PO TB24: 200.0000 mg | ORAL_TABLET | Freq: Every day | ORAL | Status: DC

## 2013-08-28 MED ORDER — PANTOPRAZOLE SODIUM 40 MG PO TBEC: 40.0000 mg | DELAYED_RELEASE_TABLET | Freq: Every day | ORAL | Status: DC

## 2013-08-28 MED ORDER — VALSARTAN 160 MG PO TABS: 160.0000 mg | ORAL_TABLET | Freq: Every day | ORAL | Status: DC

## 2013-08-28 MED ORDER — CEPHALEXIN 500 MG PO CAPS: 500.0000 mg | ORAL_CAPSULE | Freq: Three times a day (TID) | ORAL | Status: DC

## 2013-08-28 NOTE — Assessment & Plan Note (Signed)
Dry mouth and skin Dysphagia TSH today

## 2013-08-28 NOTE — Patient Instructions (Addendum)
Thank you for coming in today Please come back to see me sometime in the next few weeks for your memory Please start the Keflex for a possible urinary tract infection Please follow up with the ENT doctor for your difficulty swallowing.

## 2013-08-28 NOTE — Assessment & Plan Note (Signed)
Elevated for several visits e3ven w/ rechecks Taking medications as prescribed Inc metop to 200mg 

## 2013-08-28 NOTE — Assessment & Plan Note (Signed)
H/o smoking and dysphagia for solids and liquids that is getting worse ENT referral

## 2013-08-28 NOTE — Progress Notes (Signed)
Kristine Boyer is a 72 y.o. female who presents to [facility] today for flank pain.  Flank pain: started >7 days ago. Comes and goes. Worse w/ ambulation and sitting and improves w/ rest. Primarily L sided. Sharp and achy. Dysuria. Frequency. Improves after urination.   HTN: taking meds. No CP, paliptations.   Difficulty swallowing: feels like food goes in nostril when tries to swallow. Present for >33yr. Occurs w/ food and liquid. Will sometimes blow nose and have food come out. H/o smoking. Getting worse  Dry mouth and skin: present for a couple months  The following portions of the patient's history were reviewed and updated as appropriate: allergies, current medications, past medical history, family and social history, and problem list.  Patient is a nonsmoker.  Past Medical History  Diagnosis Date  . Diabetes mellitus   . Immune deficiency disorder    ROS as above otherwise neg.    Medications reviewed. Current Outpatient Prescriptions  Medication Sig Dispense Refill  . aspirin EC 81 MG tablet Take 81 mg by mouth daily.      [name] atorvastatin (LIPITOR) 40 MG tablet Take 1 tablet (40 mg total) by mouth daily.  90 tablet  3  . calcium carbonate (OS-CAL) 600 MG TABS Take 600 mg by mouth 2 (two) times daily with a meal.      . cyclobenzaprine (FLEXERIL) 5 MG tablet Take 1-2 tablets (5-10 mg total) by mouth 3 (three) times daily as needed for muscle spasms.  30 tablet  1  . Elastic Bandages & Supports (MEDICAL COMPRESSION STOCKINGS) MISC 1 Units by Does not apply route daily as needed.  1 each  1  . insulin glargine (LANTUS) 100 units/mL SOLN Inject 50 units each morning and 20 units each evening,  3 mL  11  . Insulin Pen Needle 31G X 5 MM MISC Use with lantus insulin pen as directed twice daily.  100 each  3  . levothyroxine (SYNTHROID, LEVOTHROID) 175 MCG tablet Take 1 tablet (175 mcg total) by mouth daily.  90 tablet  3  . meloxicam (MOBIC) 7.5 MG tablet Take 1-2 tablets (7.5-15 mg total) by  mouth daily.  30 tablet  1  . metoprolol succinate (TOPROL-XL) 100 MG 24 hr tablet Take 1 tablet (100 mg total) by mouth daily.  90 tablet  3  . Multiple Vitamin (MULTIVITAMIN) tablet Take 1 tablet by mouth daily.      . pantoprazole (PROTONIX) 40 MG tablet Take 1 tablet (40 mg total) by mouth daily.  90 tablet  3  . polyethylene glycol powder (GLYCOLAX/MIRALAX) powder Take 17 g by mouth 2 (two) times daily as needed.  3350 g  1  . valsartan (DIOVAN) 160 MG tablet Take 1 tablet (160 mg total) by mouth daily.  90 tablet  3   No current facility-administered medications for this visit.    Exam: BP 165/87  Pulse 92  Temp(Src) 98 F (36.7 C) (Oral)  Wt 208 lb (94.348 kg) Gen: Well NAD HEENT: EOMI,  MMM   No results found for this or any previous visit (from the past 72 hour(s)).

## 2013-08-28 NOTE — Assessment & Plan Note (Signed)
UTI/ early pyelo vs hip OA Keflex

## 2013-08-28 NOTE — Addendum Note (Signed)
Addended by: Henri Medal on: 08/28/2013 04:57 PM   Modules accepted: Orders

## 2013-08-30 ENCOUNTER — Telehealth: Payer: Self-pay | Admitting: Family Medicine

## 2013-08-30 NOTE — Telephone Encounter (Signed)
Attempted to call pt regarding thyriod studies. Unable to leave VM on both nuimbers in chart Pts thyroid numbers are high and either needs to increase thyroid medicine or to take medicine as prescribed  Shelly Flatten, MD Family Medicine PGY-3 08/30/2013, 10:59 AM

## 2013-09-06 ENCOUNTER — Ambulatory Visit: Payer: Medicare HMO | Admitting: Home Health Services

## 2013-09-08 ENCOUNTER — Emergency Department (HOSPITAL_COMMUNITY): Payer: Medicare HMO

## 2013-09-08 ENCOUNTER — Emergency Department (HOSPITAL_COMMUNITY)
Admission: EM | Admit: 2013-09-08 | Discharge: 2013-09-08 | Disposition: A | Discharge status: Home / Self Care | Payer: Medicare HMO | Attending: Emergency Medicine | Admitting: Emergency Medicine

## 2013-09-08 ENCOUNTER — Encounter (HOSPITAL_COMMUNITY): Payer: Self-pay | Admitting: Emergency Medicine

## 2013-09-08 ENCOUNTER — Telehealth: Payer: Self-pay | Admitting: Family Medicine

## 2013-09-08 DIAGNOSIS — G8929 Other chronic pain: Secondary | ICD-10-CM | POA: Insufficient documentation

## 2013-09-08 DIAGNOSIS — E119 Type 2 diabetes mellitus without complications: Secondary | ICD-10-CM | POA: Insufficient documentation

## 2013-09-08 DIAGNOSIS — E876 Hypokalemia: Secondary | ICD-10-CM | POA: Insufficient documentation

## 2013-09-08 DIAGNOSIS — R1032 Left lower quadrant pain: Secondary | ICD-10-CM | POA: Insufficient documentation

## 2013-09-08 DIAGNOSIS — Z90710 Acquired absence of both cervix and uterus: Secondary | ICD-10-CM | POA: Insufficient documentation

## 2013-09-08 DIAGNOSIS — Z794 Long term (current) use of insulin: Secondary | ICD-10-CM | POA: Insufficient documentation

## 2013-09-08 DIAGNOSIS — Z792 Long term (current) use of antibiotics: Secondary | ICD-10-CM | POA: Insufficient documentation

## 2013-09-08 DIAGNOSIS — Z87891 Personal history of nicotine dependence: Secondary | ICD-10-CM | POA: Insufficient documentation

## 2013-09-08 DIAGNOSIS — R109 Unspecified abdominal pain: Secondary | ICD-10-CM

## 2013-09-08 DIAGNOSIS — Z791 Long term (current) use of non-steroidal anti-inflammatories (NSAID): Secondary | ICD-10-CM | POA: Insufficient documentation

## 2013-09-08 DIAGNOSIS — I10 Essential (primary) hypertension: Secondary | ICD-10-CM | POA: Insufficient documentation

## 2013-09-08 DIAGNOSIS — Z79899 Other long term (current) drug therapy: Secondary | ICD-10-CM | POA: Insufficient documentation

## 2013-09-08 HISTORY — DX: Essential (primary) hypertension: I10

## 2013-09-08 LAB — CBC WITH DIFFERENTIAL/PLATELET
Basophils Absolute: 0 10*3/uL (ref 0.0–0.1)
Basophils Relative: 0 % (ref 0–1)
Eosinophils Absolute: 0.1 10*3/uL (ref 0.0–0.7)
Eosinophils Relative: 1 % (ref 0–5)
HCT: 36.1 % (ref 36.0–46.0)
MCH: 27.4 pg (ref 26.0–34.0)
MCHC: 34.6 g/dL (ref 30.0–36.0)
Monocytes Absolute: 0.9 10*3/uL (ref 0.1–1.0)
Neutro Abs: 6.8 10*3/uL (ref 1.7–7.7)
Neutrophils Relative %: 64 % (ref 43–77)
RDW: 13.3 % (ref 11.5–15.5)

## 2013-09-08 LAB — COMPREHENSIVE METABOLIC PANEL
AST: 17 U/L (ref 0–37)
Albumin: 3.9 g/dL (ref 3.5–5.2)
Calcium: 9.2 mg/dL (ref 8.4–10.5)
Chloride: 100 mEq/L (ref 96–112)
Creatinine, Ser: 0.89 mg/dL (ref 0.50–1.10)
Total Bilirubin: 0.5 mg/dL (ref 0.3–1.2)
Total Protein: 8 g/dL (ref 6.0–8.3)

## 2013-09-08 LAB — URINALYSIS, ROUTINE W REFLEX MICROSCOPIC
Bilirubin Urine: NEGATIVE
Glucose, UA: >1000 mg/dL — AB
Leukocytes, UA: NEGATIVE
Protein, ur: 100 mg/dL — AB
Specific Gravity, Urine: 1.021 (ref 1.005–1.030)
pH: 6 (ref 5.0–8.0)

## 2013-09-08 LAB — URINE MICROSCOPIC-ADD ON

## 2013-09-08 MED ORDER — IOHEXOL 300 MG/ML  SOLN
25.0000 mL | INTRAMUSCULAR | Status: AC
  Administered 2013-09-08 (×2): 25 mL via ORAL

## 2013-09-08 MED ORDER — POTASSIUM CHLORIDE CRYS ER 20 MEQ PO TBCR
40.0000 meq | EXTENDED_RELEASE_TABLET | Freq: Once | ORAL | Status: AC
  Administered 2013-09-08: 40 meq via ORAL
  Filled 2013-09-08: qty 2

## 2013-09-08 MED ORDER — METOPROLOL SUCCINATE ER 100 MG PO TB24
100.0000 mg | ORAL_TABLET | Freq: Every day | ORAL | Status: DC
  Administered 2013-09-08: 100 mg via ORAL
  Filled 2013-09-08: qty 1

## 2013-09-08 MED ORDER — OXYCODONE-ACETAMINOPHEN 5-325 MG PO TABS: 2.0000 | ORAL_TABLET | ORAL | Status: DC | PRN

## 2013-09-08 MED ORDER — IRBESARTAN 150 MG PO TABS
150.0000 mg | ORAL_TABLET | Freq: Every day | ORAL | Status: DC
  Administered 2013-09-08: 150 mg via ORAL
  Filled 2013-09-08: qty 1

## 2013-09-08 MED ORDER — HYDROMORPHONE HCL PF 1 MG/ML IJ SOLN
0.5000 mg | INTRAMUSCULAR | Status: AC
  Administered 2013-09-08: 0.5 mg via INTRAMUSCULAR
  Filled 2013-09-08: qty 1

## 2013-09-08 NOTE — ED Notes (Signed)
Pt. reports LLQ pain for several days , nausea and diarrhea denies emesis , no fever or chills.

## 2013-09-08 NOTE — ED Provider Notes (Signed)
CSN: [id number]     Arrival date & time 09/08/13  0204 History   First MD Initiated Contact with Patient 09/08/13 0256     Chief Complaint  Patient presents with  . Abdominal Pain   (Consider location/radiation/quality/duration/timing/severity/associated sxs/prior Treatment) HPI Comments: 72 year old female with a history of chronic left lower quadrant pain presents with a complaint of abdominal pain, she states that she has had this pain ever since having a colonoscopy over one year ago. She was told that his colonoscopy was normal. However ever since that colonoscopy she has had intermittent left lower cord was at times  Patient is a 72 y.o. female presenting with abdominal pain. The history is provided by the patient and a relative.  Abdominal Pain   Past Medical History  Diagnosis Date  . Diabetes mellitus   . Immune deficiency disorder   . Hypertension    Past Surgical History  Procedure Laterality Date  . Abdominal hysterectomy    . Tonsilectomy, adenoidectomy, bilateral myringotomy and tubes     Family History  Problem Relation Age of Onset  . Diabetes Mother    History  Substance Use Topics  . Smoking status: Former Smoker    Quit date: 02/09/1986  . Smokeless tobacco: Never Used  . Alcohol Use: 0.5 oz/week    1 drink(s) per week   OB History   Grav Para Term Preterm Abortions TAB SAB Ect Mult Living                 Review of Systems  Gastrointestinal: Positive for abdominal pain.    Allergies  Vicodin and Neurontin  Home Medications   Current Outpatient Rx  Name  Route  Sig  Dispense  Refill  . EXPIRED: aspirin EC 81 MG tablet   Oral   Take 81 mg by mouth daily.         [name] atorvastatin (LIPITOR) 40 MG tablet   Oral   Take 1 tablet (40 mg total) by mouth daily.   90 tablet   3   . calcium carbonate (OS-CAL) 600 MG TABS   Oral   Take 600 mg by mouth 2 (two) times daily with a meal.         . cephALEXin (KEFLEX) 500 MG capsule   Oral    Take 1 capsule (500 mg total) by mouth 3 (three) times daily.   30 capsule   0   . cyclobenzaprine (FLEXERIL) 5 MG tablet   Oral   Take 1-2 tablets (5-10 mg total) by mouth 3 (three) times daily as needed for muscle spasms.   90 tablet   1   . Elastic Bandages & Supports (MEDICAL COMPRESSION STOCKINGS) MISC   Does not apply   1 Units by Does not apply route daily as needed.   1 each   1   . insulin glargine (LANTUS) 100 units/mL SOLN      Inject 50 units each morning and 20 units each evening,   3 mL   11   . Insulin Pen Needle 31G X 5 MM MISC      Use with lantus insulin pen as directed twice daily.   100 each   3   . levothyroxine (SYNTHROID, LEVOTHROID) 175 MCG tablet   Oral   Take 1 tablet (175 mcg total) by mouth daily.   90 tablet   3   . meloxicam (MOBIC) 7.5 MG tablet   Oral   Take 1-2 tablets (7.5-15 mg total) by  mouth daily.   30 tablet   1   . metoprolol (TOPROL-XL) 200 MG 24 hr tablet   Oral   Take 1 tablet (200 mg total) by mouth daily.   90 tablet   3   . Multiple Vitamin (MULTIVITAMIN) tablet   Oral   Take 1 tablet by mouth daily.         [name] oxyCODONE-acetaminophen (PERCOCET/ROXICET) 5-325 MG per tablet   Oral   Take 2 tablets by mouth every 4 (four) hours as needed for severe pain.   6 tablet   0   . pantoprazole (PROTONIX) 40 MG tablet   Oral   Take 1 tablet (40 mg total) by mouth daily.   90 tablet   3   . polyethylene glycol powder (GLYCOLAX/MIRALAX) powder   Oral   Take 17 g by mouth 2 (two) times daily as needed.   3350 g   1   . valsartan (DIOVAN) 160 MG tablet   Oral   Take 1 tablet (160 mg total) by mouth daily.   90 tablet   3    BP 181/95  Pulse 87  Temp(Src) 98 F (36.7 C) (Oral)  Resp 16  Ht 5\' 2"  (1.575 m)  Wt 207 lb (93.895 kg)  BMI 37.85 kg/m2  SpO2 100% Physical Exam  Nursing note and vitals reviewed. Constitutional: She appears well-developed and well-nourished. No distress.  HENT:  Head:  Normocephalic and atraumatic.  Mouth/Throat: Oropharynx is clear and moist. No oropharyngeal exudate.  Eyes: Conjunctivae and EOM are normal. Pupils are equal, round, and reactive to light. Right eye exhibits no discharge. Left eye exhibits no discharge. No scleral icterus.  Neck: Normal range of motion. Neck supple. No JVD present. No thyromegaly present.  Cardiovascular: Normal rate, regular rhythm, normal heart sounds and intact distal pulses.  Exam reveals no gallop and no friction rub.   No murmur heard. Pulmonary/Chest: Effort normal and breath sounds normal. No respiratory distress. She has no wheezes. She has no rales.  Abdominal: Soft. Bowel sounds are normal. She exhibits no distension and no mass. There is no tenderness.  No abdominal tenderness on exam while patient is talking and distracted  Musculoskeletal: Normal range of motion. She exhibits no edema and no tenderness.  Lymphadenopathy:    She has no cervical adenopathy.  Neurological: She is alert. Coordination normal.  Skin: Skin is warm and dry. No rash noted. No erythema.  Psychiatric: She has a normal mood and affect. Her behavior is normal.    ED Course  Procedures (including critical care time) Labs Review Labs Reviewed  COMPREHENSIVE METABOLIC PANEL - Abnormal; Notable for the following:    Potassium 2.9 (*)    Glucose, Bld 288 (*)    Alkaline Phosphatase 127 (*)    GFR calc non Af Amer 64 (*)    GFR calc Af Amer 74 (*)    All other components within normal limits  URINALYSIS, ROUTINE W REFLEX MICROSCOPIC - Abnormal; Notable for the following:    Glucose, UA >1000 (*)    Hgb urine dipstick SMALL (*)    Ketones, ur 15 (*)    Protein, ur 100 (*)    All other components within normal limits  URINE MICROSCOPIC-ADD ON - Abnormal; Notable for the following:    Squamous Epithelial / LPF FEW (*)    All other components within normal limits  CBC WITH DIFFERENTIAL   Imaging Review Ct Abdomen Pelvis Wo  Contrast  09/08/2013  CLINICAL DATA:  Left lower quadrant abdominal pain, nausea and diarrhea.  EXAM: CT ABDOMEN AND PELVIS WITHOUT CONTRAST  TECHNIQUE: Multidetector CT imaging of the abdomen and pelvis was performed following the standard protocol without intravenous contrast.  COMPARISON:  CT of the abdomen and pelvis performed 06/04/2012  FINDINGS: A 4 mm nodule at the left lung base (image 16 of 30) is grossly unchanged from 2013 and likely benign. The visualized lung bases are otherwise clear.  The liver and spleen are unremarkable in appearance. The gallbladder is within normal limits. The pancreas and adrenal glands are unremarkable.  The kidneys are unremarkable in appearance. There is no evidence of hydronephrosis. No renal or ureteral stones are seen. No perinephric stranding is appreciated. The lobulated appearance of the lower pole of the right kidney remains within normal limits, on correlation with the prior contrast-enhanced study.  No free fluid is identified. The small bowel is unremarkable in appearance. The stomach is within normal limits. No acute vascular abnormalities are seen. Scattered calcification is noted along the distal abdominal aorta and its branches.  A tiny umbilical hernia is noted, containing only fat.  The appendix is normal in caliber and contains air, without evidence for appendicitis. A single contrast filled diverticulum is noted at the cecum. The colon is unremarkable in appearance.  The bladder is mildly distended and grossly unremarkable in appearance. The patient is status post hysterectomy. No suspicious adnexal masses are seen. No inguinal lymphadenopathy is seen.  No acute osseous abnormalities are identified. Diffuse facet disease is noted along the lumbar spine.  IMPRESSION: 1. No acute abnormality seen within the abdomen or pelvis. 2. Tiny umbilical hernia, containing only fat. 3. Scattered calcification along the distal abdominal aorta and its branches.    Electronically Signed   By: Roanna Raider M.D.   On: 09/08/2013 06:33    EKG Interpretation   None       MDM   1. Abdominal pain   2.  Hypertension 3.  hypokalemia The patient has no significant tenderness, labs showed mild hypokalemia, slight glucosuria, she is hypertensive but this has improved, she is unsure of the last time she took her medications, she has medications at home, she was given antihypertensives here. She appears stable for discharge home she can followup in the outpatient setting for her chronic abdominal pain which according to the CAT scan shows no acute findings.  Meds given in ED:  Medications  metoprolol succinate (TOPROL-XL) 24 hr tablet 100 mg (100 mg Oral Given 09/08/13 0526)  irbesartan (AVAPRO) tablet 150 mg (150 mg Oral Given 09/08/13 0526)  iohexol (OMNIPAQUE) 300 MG/ML solution 25 mL (25 mLs Oral Contrast Given 09/08/13 0435)  potassium chloride SA (K-DUR,KLOR-CON) CR tablet 40 mEq (40 mEq Oral Given 09/08/13 0504)  HYDROmorphone (DILAUDID) injection 0.5 mg (0.5 mg Intramuscular Given 09/08/13 0504)    New Prescriptions   OXYCODONE-ACETAMINOPHEN (PERCOCET/ROXICET) 5-325 MG PER TABLET    Take 2 tablets by mouth every 4 (four) hours as needed for severe pain.        Vida Roller, MD 09/08/13 [phone]

## 2013-09-08 NOTE — Telephone Encounter (Signed)
[facility] line Call  Patient started the call off by stating that she is coming to the ED to be evaluated. She notes a sharp pain in her side and states shes been constipated. Notes dry mouth and hard to open her mouth due to this issue. Also states she felt a lump moving in her side that has caused a sharp pain especially when she leans over to the side. Notes shes been drinking lots of water and gatorade to hydrate herself. Advised patient that they would evaluate her in the ED and if she needed to be admitted I would be happy to come see her.   Marikay Alar, MD [facility] PGY-2

## 2013-09-20 ENCOUNTER — Ambulatory Visit: Payer: Medicare HMO | Admitting: Family Medicine

## 2013-10-04 ENCOUNTER — Ambulatory Visit (INDEPENDENT_AMBULATORY_CARE_PROVIDER_SITE_OTHER): Payer: Medicare HMO | Admitting: Family Medicine

## 2013-10-04 ENCOUNTER — Encounter: Payer: Self-pay | Admitting: Family Medicine

## 2013-10-04 VITALS — BP 172/101 | HR 84 | Temp 98.1°F | Ht 62.0 in | Wt 200.0 lb

## 2013-10-04 DIAGNOSIS — I10 Essential (primary) hypertension: Secondary | ICD-10-CM

## 2013-10-04 DIAGNOSIS — E785 Hyperlipidemia, unspecified: Secondary | ICD-10-CM

## 2013-10-04 DIAGNOSIS — M7918 Myalgia, other site: Secondary | ICD-10-CM

## 2013-10-04 DIAGNOSIS — IMO0001 Reserved for inherently not codable concepts without codable children: Secondary | ICD-10-CM

## 2013-10-04 DIAGNOSIS — R109 Unspecified abdominal pain: Secondary | ICD-10-CM

## 2013-10-04 DIAGNOSIS — E119 Type 2 diabetes mellitus without complications: Secondary | ICD-10-CM

## 2013-10-04 DIAGNOSIS — E876 Hypokalemia: Secondary | ICD-10-CM

## 2013-10-04 DIAGNOSIS — K219 Gastro-esophageal reflux disease without esophagitis: Secondary | ICD-10-CM

## 2013-10-04 HISTORY — DX: Unspecified abdominal pain: R10.9

## 2013-10-04 LAB — POCT GLYCOSYLATED HEMOGLOBIN (HGB A1C): Hemoglobin A1C: 8.8

## 2013-10-04 LAB — POCT H PYLORI SCREEN: H PYLORI SCREEN, POC: POSITIVE

## 2013-10-04 MED ORDER — ATORVASTATIN CALCIUM 40 MG PO TABS: 40.0000 mg | ORAL_TABLET | Freq: Every day | ORAL | Status: DC

## 2013-10-04 MED ORDER — POTASSIUM CHLORIDE CRYS ER 10 MEQ PO TBCR: 40.0000 meq | EXTENDED_RELEASE_TABLET | Freq: Once | ORAL | Status: DC

## 2013-10-04 MED ORDER — PANTOPRAZOLE SODIUM 40 MG PO TBEC: 40.0000 mg | DELAYED_RELEASE_TABLET | Freq: Every day | ORAL | Status: DC

## 2013-10-04 MED ORDER — VALSARTAN 160 MG PO TABS: 160.0000 mg | ORAL_TABLET | Freq: Every day | ORAL | Status: DC

## 2013-10-04 MED ORDER — LEVOTHYROXINE SODIUM 175 MCG PO TABS: 175.0000 ug | ORAL_TABLET | Freq: Every day | ORAL | Status: DC

## 2013-10-04 MED ORDER — CYCLOBENZAPRINE HCL 5 MG PO TABS: 5.0000 mg | ORAL_TABLET | Freq: Three times a day (TID) | ORAL | Status: DC | PRN

## 2013-10-04 MED ORDER — INSULIN GLARGINE 100 UNITS/ML SOLOSTAR PEN: PEN_INJECTOR | SUBCUTANEOUS | Status: DC

## 2013-10-04 MED ORDER — METOPROLOL SUCCINATE ER 200 MG PO TB24: 200.0000 mg | ORAL_TABLET | Freq: Every day | ORAL | Status: DC

## 2013-10-04 NOTE — Assessment & Plan Note (Signed)
Refill protonix 

## 2013-10-04 NOTE — Progress Notes (Signed)
Kristine Boyer is a 73 y.o. female who presents to [facility] today for abd pain.  Abd pain: seen in ED on 12/12 for abd pain. Reviewed ED records. CT nml. Present for > 1 mo. Worsening. Worse after meals. Taking protonix. Sharp pain. Feels food in throat. Intermittent. Pt w/o protonix for greater than 2 months. Occasional difficulty swallowing food. No night sweats or wt loss.   HTN: out at family friends for sevaral weeks and did not take meds with her. Feels it is elevated. Has pills at home.   TSH: significantly elevated. Lost meds.   DM: eating more during holidays and taking less insulin.   The following portions of the patient's history were reviewed and updated as appropriate: allergies, current medications, past medical history, family and social history, and problem list.    Past Medical History  Diagnosis Date  . Diabetes mellitus   . Immune deficiency disorder   . Hypertension    ROS as above otherwise neg.    Medications reviewed. Current Outpatient Prescriptions  Medication Sig Dispense Refill  . aspirin EC 81 MG tablet Take 81 mg by mouth daily.      [name] atorvastatin (LIPITOR) 40 MG tablet Take 1 tablet (40 mg total) by mouth daily.  90 tablet  3  . calcium carbonate (OS-CAL) 600 MG TABS Take 600 mg by mouth 2 (two) times daily with a meal.      . cyclobenzaprine (FLEXERIL) 5 MG tablet Take 1-2 tablets (5-10 mg total) by mouth 3 (three) times daily as needed for muscle spasms.  90 tablet  1  . Elastic Bandages & Supports (MEDICAL COMPRESSION STOCKINGS) MISC 1 Units by Does not apply route daily as needed.  1 each  1  . insulin glargine (LANTUS) 100 units/mL SOLN Inject 50 units each morning and 20 units each evening,  3 mL  11  . Insulin Pen Needle 31G X 5 MM MISC Use with lantus insulin pen as directed twice daily.  100 each  3  . levothyroxine (SYNTHROID, LEVOTHROID) 175 MCG tablet Take 1 tablet (175 mcg total) by mouth daily.  90 tablet  3  . meloxicam (MOBIC) 7.5 MG tablet  Take 1-2 tablets (7.5-15 mg total) by mouth daily.  30 tablet  1  . metoprolol (TOPROL-XL) 200 MG 24 hr tablet Take 1 tablet (200 mg total) by mouth daily.  90 tablet  3  . Multiple Vitamin (MULTIVITAMIN) tablet Take 1 tablet by mouth daily.      . pantoprazole (PROTONIX) 40 MG tablet Take 1 tablet (40 mg total) by mouth daily.  90 tablet  3  . polyethylene glycol powder (GLYCOLAX/MIRALAX) powder Take 17 g by mouth 2 (two) times daily as needed.  3350 g  1  . valsartan (DIOVAN) 160 MG tablet Take 1 tablet (160 mg total) by mouth daily.  90 tablet  3   Current Facility-Administered Medications  Medication Dose Route Frequency Provider Last Rate Last Dose  . potassium chloride (K-DUR,KLOR-CON) CR tablet 40 mEq  40 mEq Oral Once Waldemar Dickens, MD        Exam:  BP 172/101  Pulse 84  Temp(Src) 98.1 F (36.7 C) (Oral)  Ht 5\' 2"  (1.575 m)  Wt 200 lb (90.719 kg)  BMI 36.57 kg/m2 Gen: Well NAD HEENT: EOMI,  MMM Lungs: CTABL Nl WOB Heart: RRR no MRG Abd: NABS, mild epigastric pain on palpation Exts: LE, warm and well perfused.   Results for orders placed in visit  on 10/04/13 (from the past 72 hour(s))  POCT GLYCOSYLATED HEMOGLOBIN (HGB A1C)     Status: Abnormal   Collection Time    10/04/13  3:18 PM      Result Value Range   Hemoglobin A1C 8.8      A/P (as seen in Problem list)  Abdominal  pain, other specified site H/o GERD Associated w/ food. No concern for cholecystitis or appendicitis H.pylori today Restart protonix  HYPERTENSION, BENIGN SYSTEMIC asymtpomatic Not aty goal off therapy Restarting meds  DIABETES MELLITUS II, UNCOMPLICATED Elevated today due to holidays and not taking insulin as prescribed Reiterated importance of meds Pt to be compliant   Hypokalemia Refill Kdur 2.9 in ED No cardiac s/s  GASTROESOPHAGEAL REFLUX, NO ESOPHAGITIS Refill protonix

## 2013-10-04 NOTE — Assessment & Plan Note (Signed)
Refill Kdur 2.9 in ED No cardiac s/s

## 2013-10-04 NOTE — Assessment & Plan Note (Signed)
asymtpomatic Not aty goal off therapy Restarting meds

## 2013-10-04 NOTE — Patient Instructions (Signed)
You are doing well overall Please restart all of your medications as prescribed Please come back 4 weeks for a memory appointment.

## 2013-10-04 NOTE — Assessment & Plan Note (Signed)
Elevated today due to holidays and not taking insulin as prescribed Reiterated importance of meds Pt to be compliant

## 2013-10-04 NOTE — Assessment & Plan Note (Addendum)
H/o GERD Associated w/ food. No concern for cholecystitis or appendicitis H.pylori today Restart protonix  ------------------- ADDENDUM  H.Pylori + Clarithro 500mg  BID Amox 1gm BID  Cont protonix

## 2013-10-05 ENCOUNTER — Telehealth: Payer: Self-pay | Admitting: *Deleted

## 2013-10-05 MED ORDER — AMOXICILLIN 500 MG PO CAPS
1000.0000 mg | ORAL_CAPSULE | Freq: Two times a day (BID) | ORAL | Status: DC
Start: 2013-10-05 — End: 2013-10-06

## 2013-10-05 MED ORDER — CLARITHROMYCIN 500 MG PO TABS: 500.0000 mg | ORAL_TABLET | Freq: Two times a day (BID) | ORAL | Status: DC

## 2013-10-05 NOTE — Telephone Encounter (Signed)
Message copied by Valerie Roys on Thu Oct 05, 2013 11:33 AM ------      Message from: [facility], DAVID J      Created: Thu Oct 05, 2013 11:11 AM       Please call pt and infrom of + H.Pylori test and she needs to start 2 wk therapy with amox and clarithro as well as PPI which she is already on. ABX Rx sent to pharmacy and waiting for pick up ------

## 2013-10-05 NOTE — Addendum Note (Signed)
Addended by: Marily Memos, Sallyann Kinnaird J on: 10/05/2013 11:11 AM   Modules accepted: Orders

## 2013-10-05 NOTE — Telephone Encounter (Signed)
LM for pt to call back.  Jazmin Hartsell,CMA

## 2013-10-06 MED ORDER — AMOXICILLIN 500 MG PO CAPS: 1000.0000 mg | ORAL_CAPSULE | Freq: Two times a day (BID) | ORAL | Status: DC

## 2013-10-06 MED ORDER — CLARITHROMYCIN 500 MG PO TABS: 500.0000 mg | ORAL_TABLET | Freq: Two times a day (BID) | ORAL | Status: DC

## 2013-10-06 NOTE — Telephone Encounter (Signed)
Pt and daughter are aware of results.  Please send abx to [facility] on [address].  It was initially sent to her mail order. Lulani Bour,CMA

## 2013-10-06 NOTE — Telephone Encounter (Signed)
Done

## 2013-10-19 ENCOUNTER — Encounter: Payer: Self-pay | Admitting: Family Medicine

## 2013-10-19 ENCOUNTER — Telehealth: Payer: Self-pay | Admitting: Family Medicine

## 2013-10-19 ENCOUNTER — Ambulatory Visit (INDEPENDENT_AMBULATORY_CARE_PROVIDER_SITE_OTHER): Payer: Medicare HMO | Admitting: Family Medicine

## 2013-10-19 VITALS — BP 176/82 | HR 72 | Temp 98.1°F | Wt 196.0 lb

## 2013-10-19 DIAGNOSIS — I10 Essential (primary) hypertension: Secondary | ICD-10-CM

## 2013-10-19 DIAGNOSIS — R109 Unspecified abdominal pain: Secondary | ICD-10-CM

## 2013-10-19 LAB — POCT URINALYSIS DIPSTICK
BILIRUBIN UA: NEGATIVE
Blood, UA: NEGATIVE
GLUCOSE UA: NEGATIVE
Ketones, UA: NEGATIVE
LEUKOCYTES UA: NEGATIVE
NITRITE UA: NEGATIVE
Protein, UA: NEGATIVE
Spec Grav, UA: 1.02
Urobilinogen, UA: 0.2
pH, UA: 7

## 2013-10-19 MED ORDER — VALSARTAN 160 MG PO TABS
160.0000 mg | ORAL_TABLET | Freq: Every day | ORAL | Status: DC
Start: 2013-10-19 — End: 2014-02-12

## 2013-10-19 NOTE — Telephone Encounter (Signed)
Spoke with daughter and let her know that these names are the same medication.  States that mom has been taking this medication and they have it with them. Jazmin Hartsell,CMA

## 2013-10-19 NOTE — Assessment & Plan Note (Signed)
Elevated today due to pt not taking medications and pain Symptomatic w/ dizziness Clonidine 0.1mg  in office w/ improvement Brand Diovan ordered

## 2013-10-19 NOTE — Progress Notes (Addendum)
Kristine Boyer is a 73 y.o. female who presents to [facility] today for flank pain  Daughter endorses medical compliance  Flank pain: LLQ pain. Some improvement on H.Pylori treatment. Still very severe after meals. Doubles over in pain. Daily BM that are nml in color, caliber, and w/o blood.  Never took Keflex from original Dx of UTI on 09/17/13. Worse when laying on R side. Radiates around the back. Denies fevers, dysuria, frequency.  Daughter w/ cholecystitis w/ presenting symptom of LLQ pain w/ meals.   Reflux/H.pylori: finished triple therapy. Reflux improved w/ ppi. Continues to have chunks of food that come up. Occurs every morning an throughout the day. Present for several months. Some improvement.   HTN: did not take any of BP meds this am. Feeling slightly dizzy. Denies CP, HA, SOB, syncope. Generic valsartan gives HA but diovan does not.   The following portions of the patient's history were reviewed and updated as appropriate: allergies, current medications, past medical history, family and social history, and problem list.  Patient is a nonsmoker.  Past Medical History  Diagnosis Date  . Diabetes mellitus   . Immune deficiency disorder   . Hypertension     ROS as above otherwise neg.    Medications reviewed. Current Outpatient Prescriptions  Medication Sig Dispense Refill  . amoxicillin (AMOXIL) 500 MG capsule Take 2 capsules (1,000 mg total) by mouth 2 (two) times daily.  56 capsule  0  . aspirin EC 81 MG tablet Take 81 mg by mouth daily.      [name] atorvastatin (LIPITOR) 40 MG tablet Take 1 tablet (40 mg total) by mouth daily.  90 tablet  3  . calcium carbonate (OS-CAL) 600 MG TABS Take 600 mg by mouth 2 (two) times daily with a meal.      . clarithromycin (BIAXIN) 500 MG tablet Take 1 tablet (500 mg total) by mouth 2 (two) times daily.  28 tablet  0  . cyclobenzaprine (FLEXERIL) 5 MG tablet Take 1-2 tablets (5-10 mg total) by mouth 3 (three) times daily as needed for muscle spasms.   90 tablet  1  . Elastic Bandages & Supports (MEDICAL COMPRESSION STOCKINGS) MISC 1 Units by Does not apply route daily as needed.  1 each  1  . insulin glargine (LANTUS) 100 units/mL SOLN Inject 50 units each morning and 20 units each evening,  3 mL  11  . Insulin Pen Needle 31G X 5 MM MISC Use with lantus insulin pen as directed twice daily.  100 each  3  . levothyroxine (SYNTHROID, LEVOTHROID) 175 MCG tablet Take 1 tablet (175 mcg total) by mouth daily.  90 tablet  3  . meloxicam (MOBIC) 7.5 MG tablet Take 1-2 tablets (7.5-15 mg total) by mouth daily.  30 tablet  1  . metoprolol (TOPROL-XL) 200 MG 24 hr tablet Take 1 tablet (200 mg total) by mouth daily.  90 tablet  3  . Multiple Vitamin (MULTIVITAMIN) tablet Take 1 tablet by mouth daily.      . pantoprazole (PROTONIX) 40 MG tablet Take 1 tablet (40 mg total) by mouth daily.  90 tablet  3  . polyethylene glycol powder (GLYCOLAX/MIRALAX) powder Take 17 g by mouth 2 (two) times daily as needed.  3350 g  1  . valsartan (DIOVAN) 160 MG tablet Take 1 tablet (160 mg total) by mouth daily.  90 tablet  3   No current facility-administered medications for this visit.    Exam:  BP 176/82  Pulse 72  Temp(Src) 98.1 F (36.7 C) (Oral)  Wt 196 lb (88.905 kg) Gen: Well NAD HEENT: EOMI,  MMM Abd: NABS, Murphy sign neg, mcBurny's point w/o pain. LLQ pain on palpation.     Results for orders placed in visit on 10/19/13 (from the past 72 hour(s))  POCT URINALYSIS DIPSTICK     Status: None   Collection Time    10/19/13 10:16 AM      Result Value Range   Color, UA YELLOW     Clarity, UA CLEAR     Glucose, UA NEG     Bilirubin, UA NEG     Ketones, UA NEG     Spec Grav, UA 1.020     Blood, UA NEG     pH, UA 7.0     Protein, UA NEG     Urobilinogen, UA 0.2     Nitrite, UA NEG     Leukocytes, UA Negative      A/P (as seen in Problem list)  HYPERTENSION, BENIGN SYSTEMIC Elevated today due to pt not taking medications and pain Symptomatic w/  dizziness Clonidine 0.1mg  in office w/ improvement Brand Diovan ordered  Flank pain Odd presentation Previous CT from 09/08/13 w/o evidence of renal or gallbladder stones. No diverticuli. No gross abnormality of the abdomen noted UA today Unlikiely constipation as daily soft BM GERD vs msk vs psychosomatic Gastrocullt study at home x3. If blood present will likely send to GI.   Cont tramadol and flexeril   Abdominal pain, other specified site Improving after triple therapy  Continue PPI

## 2013-10-19 NOTE — Telephone Encounter (Signed)
Please call pt and inform that if she truly has been taking the medication then she needs to increase to BID dosing

## 2013-10-19 NOTE — Telephone Encounter (Signed)
Daughter is aware of this. Jazmin Hartsell,CMA  

## 2013-10-19 NOTE — Assessment & Plan Note (Signed)
Odd presentation Previous CT from 09/08/13 w/o evidence of renal or gallbladder stones. No diverticuli. No gross abnormality of the abdomen noted UA today Unlikiely constipation as daily soft BM GERD vs msk vs psychosomatic Gastrocullt study at home x3. If blood present will likely send to GI.   Cont tramadol and flexeril

## 2013-10-19 NOTE — Patient Instructions (Signed)
You are doing well overall Please use the pill box for your meds Please bring back the samples of vomit Please come back in 2 weeks Please continue your protonix Please make a separate memory appoitnment

## 2013-10-19 NOTE — Assessment & Plan Note (Signed)
Improving after triple therapy  Continue PPI

## 2013-10-19 NOTE — Telephone Encounter (Signed)
Mother was given pangoprazol (sp). Is this a generi  Prontoncic? If not what is it for? Please advsise.

## 2013-10-19 NOTE — Assessment & Plan Note (Signed)
Increase protonix to BID

## 2013-10-20 ENCOUNTER — Ambulatory Visit: Payer: Medicare HMO | Admitting: Home Health Services

## 2013-10-20 MED ORDER — POTASSIUM CHLORIDE CRYS ER 20 MEQ PO TBCR
20.0000 meq | EXTENDED_RELEASE_TABLET | Freq: Every day | ORAL | Status: DC
Start: 2013-10-20 — End: 2016-07-03

## 2013-10-20 NOTE — Addendum Note (Signed)
Addended by: Waldemar Dickens on: 10/20/2013 07:44 PM   Modules accepted: Orders

## 2013-10-23 ENCOUNTER — Telehealth: Payer: Self-pay | Admitting: Family Medicine

## 2013-10-23 NOTE — Telephone Encounter (Signed)
Pt strained yesterday having a BM. There was some  Blood in her stool. She is in a lot of pain.  She cannot tolerate the pain  Please advise

## 2013-10-24 ENCOUNTER — Ambulatory Visit (INDEPENDENT_AMBULATORY_CARE_PROVIDER_SITE_OTHER): Payer: Medicare HMO | Admitting: Home Health Services

## 2013-10-24 ENCOUNTER — Ambulatory Visit (INDEPENDENT_AMBULATORY_CARE_PROVIDER_SITE_OTHER): Payer: Medicare HMO | Admitting: Family Medicine

## 2013-10-24 ENCOUNTER — Encounter: Payer: Self-pay | Admitting: Home Health Services

## 2013-10-24 ENCOUNTER — Other Ambulatory Visit: Payer: Self-pay | Admitting: *Deleted

## 2013-10-24 ENCOUNTER — Encounter: Payer: Self-pay | Admitting: Family Medicine

## 2013-10-24 ENCOUNTER — Encounter: Payer: Self-pay | Admitting: Gastroenterology

## 2013-10-24 VITALS — BP 150/88 | HR 70 | Temp 98.1°F | Ht 63.0 in | Wt 197.0 lb

## 2013-10-24 DIAGNOSIS — Z0000 Encounter for general adult medical examination without abnormal findings: Secondary | ICD-10-CM

## 2013-10-24 DIAGNOSIS — E785 Hyperlipidemia, unspecified: Secondary | ICD-10-CM

## 2013-10-24 DIAGNOSIS — E119 Type 2 diabetes mellitus without complications: Secondary | ICD-10-CM

## 2013-10-24 DIAGNOSIS — Z23 Encounter for immunization: Secondary | ICD-10-CM

## 2013-10-24 DIAGNOSIS — R109 Unspecified abdominal pain: Secondary | ICD-10-CM

## 2013-10-24 DIAGNOSIS — I10 Essential (primary) hypertension: Secondary | ICD-10-CM

## 2013-10-24 MED ORDER — OXYCODONE-ACETAMINOPHEN 10-325 MG PO TABS: 1.0000 | ORAL_TABLET | Freq: Three times a day (TID) | ORAL | Status: DC | PRN

## 2013-10-24 MED ORDER — DISPOSABLE ENEMA 19-7 GM/118ML RE ENEM: 1.0000 | ENEMA | Freq: Once | RECTAL | Status: DC

## 2013-10-24 MED ORDER — SENNOSIDES 8.6 MG PO TABS: 2.0000 | ORAL_TABLET | Freq: Two times a day (BID) | ORAL | Status: AC

## 2013-10-24 MED ORDER — GLYCERIN (LAXATIVE) 2 G RE SUPP: 1.0000 | Freq: Once | RECTAL | Status: DC

## 2013-10-24 NOTE — Progress Notes (Signed)
Patient here for annual wellness visit, patient reports: Risk Factors/Conditions needing evaluation or treatment: Pt does not have any new risk factors that need evaluation. Home Safety: Pt lives with son in 2 story home.  Pt reports having smoke detectors and does not have adaptive equipment in the bathroom. Other Information: Corrective lens: Pt wears reading glasses.  Reports having problems with vision and will schedule appointment with the [facility] on [address]. Dentures: Pt has full upper partial.  Memory: Pt reports some memory problems. Patient's Mini Mental Score (recorded in doc. flowsheet): 29 Completed montreal cognitive assessment. Pt scored 35/30  Balance/Gait: Pt reports pain in right hip  Balance Abnormal Patient value  Sitting balance    Sit to stand    Attempts to arise    Immediate standing balance    Standing balance    Nudge    Eyes closed- Romberg x dizzy  Tandem stance    Back lean x unable  Neck Rotation    360 degree turn    Sitting down     Gait Abnormal Patient value  Initiation of gait    Step length-left    Step length-right x   Step height-left    Step height-right x   Step symmetry x   Step continuity    Path deviation    Trunk movement    Walking stance        Annual Wellness Visit Requirements Recorded Today In  Medical, family, social history Past Medical, Family, Social History Section  Current providers Care team  Current medications Medications  Wt, BP, Ht, BMI Vital signs  Tobacco, alcohol, illicit drug use History  ADL Nurse Assessment  Depression Screening Nurse Assessment  Cognitive impairment Nurse Assessment  Mini Mental Status Document Flowsheet  Fall Risk Fall/Depression  Home Safety Progress Note  End of Life Planning (welcome visit) Social Documentation  Medicare preventative services Progress Note  Risk factors/conditions needing evaluation/treatment Progress Note  Personalized health advice Patient  Instructions, goals, letter  Diet & Exercise Social Documentation  Emergency Contact Social Documentation  Seat Belts Social Documentation  Sun exposure/protection Social Documentation

## 2013-10-24 NOTE — Progress Notes (Signed)
Kristine Boyer is a 73 y.o. female who presents to [facility] today for SD appt for back pain.   Abd pain: ongoing. And comes on worse w/ BM. Still in LLQ. Small blood w/ BM and firm. Protonix BID w/o improvement. Pain worse at night. Last colonoscopy 3 years ago and was told to return in 1 year for repeat. Pt simply did not go back. Worse w/ dairy products and w/ all foods.    The following portions of the patient's history were reviewed and updated as appropriate: allergies, current medications, past medical history, family and social history, and problem list.  Patient is a nonsmoker.   Health Maintenance: Scheduled for colonoscopy , prevnar today  Past Medical History  Diagnosis Date  . Diabetes mellitus   . Immune deficiency disorder   . Hypertension     ROS as above otherwise neg.    Medications reviewed.  Current Outpatient Prescriptions  Medication Sig Dispense Refill  . amoxicillin (AMOXIL) 500 MG capsule Take 2 capsules (1,000 mg total) by mouth 2 (two) times daily.  56 capsule  0  . aspirin EC 81 MG tablet Take 81 mg by mouth daily.      [name] atorvastatin (LIPITOR) 40 MG tablet Take 1 tablet (40 mg total) by mouth daily.  90 tablet  3  . calcium carbonate (OS-CAL) 600 MG TABS Take 600 mg by mouth 2 (two) times daily with a meal.      . clarithromycin (BIAXIN) 500 MG tablet Take 1 tablet (500 mg total) by mouth 2 (two) times daily.  28 tablet  0  . cyclobenzaprine (FLEXERIL) 5 MG tablet Take 1-2 tablets (5-10 mg total) by mouth 3 (three) times daily as needed for muscle spasms.  90 tablet  1  . Elastic Bandages & Supports (MEDICAL COMPRESSION STOCKINGS) MISC 1 Units by Does not apply route daily as needed.  1 each  1  . glycerin adult (GLYCERIN ADULT) 2 G SUPP Place 1 suppository rectally once.  10 suppository  0  . insulin glargine (LANTUS) 100 units/mL SOLN Inject 50 units each morning and 20 units each evening,  3 mL  11  . Insulin Pen Needle 31G X 5 MM MISC Use with lantus insulin  pen as directed twice daily.  100 each  3  . levothyroxine (SYNTHROID, LEVOTHROID) 175 MCG tablet Take 1 tablet (175 mcg total) by mouth daily.  90 tablet  3  . meloxicam (MOBIC) 7.5 MG tablet Take 1-2 tablets (7.5-15 mg total) by mouth daily.  30 tablet  1  . metoprolol (TOPROL-XL) 200 MG 24 hr tablet Take 1 tablet (200 mg total) by mouth daily.  90 tablet  3  . Multiple Vitamin (MULTIVITAMIN) tablet Take 1 tablet by mouth daily.      . pantoprazole (PROTONIX) 40 MG tablet Take 1 tablet (40 mg total) by mouth daily.  90 tablet  3  . polyethylene glycol powder (GLYCOLAX/MIRALAX) powder Take 17 g by mouth 2 (two) times daily as needed.  3350 g  1  . potassium chloride SA (K-DUR,KLOR-CON) 20 MEQ tablet Take 1 tablet (20 mEq total) by mouth daily.  90 tablet  3  . senna (SENOKOT) 8.6 MG tablet Take 2 tablets (17.2 mg total) by mouth 2 (two) times daily. Once achieve daily BM decrease to one tablet daily  30 tablet  0  . sodium phosphate (FLEET) enema Place 1 enema rectally once. follow package directions  135 mL  0  . valsartan (DIOVAN)  160 MG tablet Take 1 tablet (160 mg total) by mouth daily.  90 tablet  3   No current facility-administered medications for this visit.    Exam: BP 150/88  Pulse 70  Temp(Src) 98.1 F (36.7 C) (Oral)  Ht 5\' 3"  (1.6 m)  Wt 197 lb (89.359 kg)  BMI 34.91 kg/m2 Gen: Well NAD HEENT: EOMI,  MMM   No results found for this or any previous visit (from the past 60 hour(s)).  A/P (as seen in Problem list)  Abdominal pain, other specified site Once again, etiology unclear.  Diverticulosis/itis, vs constipation, vs psychosomatic vs malignancy Colon clean out (miralax, senna, and suppository) enema ordered Pt states that her last Colonoscopy was 3 years ago and was told to have repeat study in 1 year. This cannot be confirmed Scheduled for upper and lower endoscopy w/ GI Due to severe pain complaint will try percocet (allergy to hydrocodone) x 15 pills only w/o  refill. Pt aware that this will likely make any constipation worse.    HYPERTENSION, BENIGN SYSTEMIC Elevation today likely from pain. No change at this time.

## 2013-10-24 NOTE — Patient Instructions (Addendum)
Thank you for coming in today Please start the following regimen for 1-2 days until you have multiple soft bowel movements daily Miralax 2 packets twice a day Sennokot 2 tabs twice a day Glycerine suppository once as needed Fleet enema  Please follow up with the GI doctor for both of your upper and lower scope studies Please see Dr. Fuller Plan on February 20th at [place]. Try calling to get appointment moved up or on the cancellation list Please start the percocet for pain but remember this may make your constipation worse.  Have a great day

## 2013-10-24 NOTE — Assessment & Plan Note (Signed)
Elevation today likely from pain. No change at this time.

## 2013-10-24 NOTE — Addendum Note (Signed)
Addended by: Valerie Roys on: 10/24/2013 01:29 PM   Modules accepted: Orders

## 2013-10-24 NOTE — Addendum Note (Signed)
Addended by: Valerie Roys on: 10/24/2013 11:13 AM   Modules accepted: Orders

## 2013-10-24 NOTE — Assessment & Plan Note (Signed)
Once again, etiology unclear.  Diverticulosis/itis, vs constipation, vs psychosomatic vs malignancy Colon clean out (miralax, senna, and suppository) enema ordered Pt states that her last Colonoscopy was 3 years ago and was told to have repeat study in 1 year. This cannot be confirmed Scheduled for upper and lower endoscopy w/ GI Due to severe pain complaint will try percocet (allergy to hydrocodone) x 15 pills only w/o refill. Pt aware that this will likely make any constipation worse.

## 2013-10-24 NOTE — Addendum Note (Signed)
Addended by: Lorenza Cambridge on: 10/24/2013 01:59 PM   Modules accepted: Orders

## 2013-10-25 ENCOUNTER — Ambulatory Visit: Payer: Medicare HMO | Admitting: Home Health Services

## 2013-10-25 ENCOUNTER — Other Ambulatory Visit: Payer: Commercial Managed Care - HMO

## 2013-10-25 DIAGNOSIS — E039 Hypothyroidism, unspecified: Secondary | ICD-10-CM

## 2013-10-25 DIAGNOSIS — E785 Hyperlipidemia, unspecified: Secondary | ICD-10-CM

## 2013-10-25 DIAGNOSIS — R918 Other nonspecific abnormal finding of lung field: Secondary | ICD-10-CM

## 2013-10-25 LAB — COMPREHENSIVE METABOLIC PANEL
ALBUMIN: 4 g/dL (ref 3.5–5.2)
ALT: 10 U/L (ref 0–35)
AST: 17 U/L (ref 0–37)
Alkaline Phosphatase: 105 U/L (ref 39–117)
BUN: 12 mg/dL (ref 6–23)
CALCIUM: 8.9 mg/dL (ref 8.4–10.5)
CHLORIDE: 104 meq/L (ref 96–112)
CO2: 31 mEq/L (ref 19–32)
Creat: 0.97 mg/dL (ref 0.50–1.10)
Glucose, Bld: 98 mg/dL (ref 70–99)
POTASSIUM: 3.8 meq/L (ref 3.5–5.3)
SODIUM: 144 meq/L (ref 135–145)
TOTAL PROTEIN: 7.5 g/dL (ref 6.0–8.3)
Total Bilirubin: 0.6 mg/dL (ref 0.2–1.2)

## 2013-10-25 LAB — LIPID PANEL
Cholesterol: 134 mg/dL (ref 0–200)
HDL: 52 mg/dL (ref >39)
LDL CALC: 68 mg/dL (ref 0–99)
Total CHOL/HDL Ratio: 2.6 Ratio
Triglycerides: 71 mg/dL (ref <150)
VLDL: 14 mg/dL (ref 0–40)

## 2013-10-25 LAB — TSH: TSH: 0.487 u[IU]/mL (ref 0.350–4.500)

## 2013-10-25 NOTE — Progress Notes (Signed)
CMP,TSH AND FLP DONE TODAY Kristine Boyer

## 2013-10-30 ENCOUNTER — Telehealth: Payer: Self-pay | Admitting: Family Medicine

## 2013-10-30 MED ORDER — ACCU-CHEK AVIVA PLUS W/DEVICE KIT: PACK | Status: DC

## 2013-10-30 MED ORDER — GLUCOSE BLOOD VI STRP: ORAL_STRIP | Status: DC

## 2013-10-30 MED ORDER — LANCET DEVICE MISC: Status: DC

## 2013-10-30 NOTE — Telephone Encounter (Signed)
Spoke to pt and daughter by phone  Pt glucose this am 57. Pt not symptomatic Pt unsure of dosing for insulin Reviewed dosing of lantus of 50 units in am and 20 units in pm Titration instructions given Pharmacy called and meter and test strips called in at pharmacy  Linna Darner, MD Family Medicine PGY-3 10/30/2013, 1:44 PM

## 2013-10-30 NOTE — Telephone Encounter (Signed)
Daughter called and needs some advice or instructions for her mother. When does her mother need to take insulin? She is unsure and also doesn't think that her mother knows the proper time or dose to take. She would also like to request a new meter for her mother because the one they are using is not working properly. jw

## 2013-11-01 ENCOUNTER — Encounter (HOSPITAL_COMMUNITY): Payer: Self-pay | Admitting: Emergency Medicine

## 2013-11-01 ENCOUNTER — Emergency Department (HOSPITAL_COMMUNITY)
Admission: EM | Admit: 2013-11-01 | Discharge: 2013-11-02 | Disposition: A | Discharge status: Home / Self Care | Payer: Medicare HMO | Attending: Emergency Medicine | Admitting: Emergency Medicine

## 2013-11-01 DIAGNOSIS — E119 Type 2 diabetes mellitus without complications: Secondary | ICD-10-CM | POA: Insufficient documentation

## 2013-11-01 DIAGNOSIS — Z7982 Long term (current) use of aspirin: Secondary | ICD-10-CM | POA: Insufficient documentation

## 2013-11-01 DIAGNOSIS — I10 Essential (primary) hypertension: Secondary | ICD-10-CM | POA: Insufficient documentation

## 2013-11-01 DIAGNOSIS — Z794 Long term (current) use of insulin: Secondary | ICD-10-CM | POA: Insufficient documentation

## 2013-11-01 DIAGNOSIS — K5900 Constipation, unspecified: Secondary | ICD-10-CM | POA: Insufficient documentation

## 2013-11-01 DIAGNOSIS — Z87891 Personal history of nicotine dependence: Secondary | ICD-10-CM | POA: Insufficient documentation

## 2013-11-01 DIAGNOSIS — Z79899 Other long term (current) drug therapy: Secondary | ICD-10-CM | POA: Insufficient documentation

## 2013-11-01 DIAGNOSIS — E039 Hypothyroidism, unspecified: Secondary | ICD-10-CM | POA: Insufficient documentation

## 2013-11-01 DIAGNOSIS — Z8659 Personal history of other mental and behavioral disorders: Secondary | ICD-10-CM | POA: Insufficient documentation

## 2013-11-01 DIAGNOSIS — Z8669 Personal history of other diseases of the nervous system and sense organs: Secondary | ICD-10-CM | POA: Insufficient documentation

## 2013-11-01 HISTORY — DX: Unspecified convulsions: R56.9

## 2013-11-01 HISTORY — DX: Hypothyroidism, unspecified: E03.9

## 2013-11-01 HISTORY — DX: Bipolar disorder, unspecified: F31.9

## 2013-11-01 LAB — COMPREHENSIVE METABOLIC PANEL
ALT: 13 U/L (ref 0–35)
AST: 20 U/L (ref 0–37)
Albumin: 3.4 g/dL — ABNORMAL LOW (ref 3.5–5.2)
Alkaline Phosphatase: 112 U/L (ref 39–117)
BUN: 10 mg/dL (ref 6–23)
CALCIUM: 8.8 mg/dL (ref 8.4–10.5)
CO2: 30 mEq/L (ref 19–32)
Chloride: 105 mEq/L (ref 96–112)
Creatinine, Ser: 0.94 mg/dL (ref 0.50–1.10)
GFR calc non Af Amer: 59 mL/min — ABNORMAL LOW (ref >90)
GFR, EST AFRICAN AMERICAN: 69 mL/min — AB (ref >90)
GLUCOSE: 227 mg/dL — AB (ref 70–99)
Potassium: 4 mEq/L (ref 3.7–5.3)
Sodium: 145 mEq/L (ref 137–147)
TOTAL PROTEIN: 7.5 g/dL (ref 6.0–8.3)
Total Bilirubin: 0.2 mg/dL — ABNORMAL LOW (ref 0.3–1.2)

## 2013-11-01 LAB — LIPASE, BLOOD: LIPASE: 14 U/L (ref 11–59)

## 2013-11-01 LAB — CBC WITH DIFFERENTIAL/PLATELET
BASOS ABS: 0 10*3/uL (ref 0.0–0.1)
Basophils Relative: 0 % (ref 0–1)
EOS PCT: 2 % (ref 0–5)
Eosinophils Absolute: 0.1 10*3/uL (ref 0.0–0.7)
HEMATOCRIT: 34.9 % — AB (ref 36.0–46.0)
HEMOGLOBIN: 11.8 g/dL — AB (ref 12.0–15.0)
LYMPHS ABS: 2.4 10*3/uL (ref 0.7–4.0)
Lymphocytes Relative: 27 % (ref 12–46)
MCH: 26.9 pg (ref 26.0–34.0)
MCHC: 33.8 g/dL (ref 30.0–36.0)
MCV: 79.7 fL (ref 78.0–100.0)
Monocytes Absolute: 0.8 10*3/uL (ref 0.1–1.0)
Monocytes Relative: 9 % (ref 3–12)
NEUTROS ABS: 5.6 10*3/uL (ref 1.7–7.7)
Neutrophils Relative %: 62 % (ref 43–77)
Platelets: 166 10*3/uL (ref 150–400)
RBC: 4.38 MIL/uL (ref 3.87–5.11)
RDW: 13.4 % (ref 11.5–15.5)
WBC: 9.1 10*3/uL (ref 4.0–10.5)

## 2013-11-01 NOTE — ED Provider Notes (Signed)
CSN: [id number]     Arrival date & time 11/01/13  1700 History   First MD Initiated Contact with Patient 11/01/13 1903     Chief Complaint  Patient presents with  . Abdominal Pain    HPI: Ms. Ramseur is a 73 yo F with history of DM, HTN, hypothyroidism and seizures who presents with abdominal pain. She has a history of chronic constipation. For the last 2-3 months she has had daily LLQ pain. She has been evaluated in our ED and by her PCP several times. She last had a colonoscopy 5 years ago. She has chronic back pain which she takes narcotics for. She does take Senna daily. Today she feels as if she needs to have a BM which is causing her pain. Described as aching, non-radiating, no exacerbating factors. Pain is similar to pain she has had multiple times in the past. She gave herself an enema and had a small BM. She does endorse seeing blood in her stool. She last had a BM one week ago before today, there was dark red blood mixed in with the stool. Today she is unsure if she had blood in her stool or not. She is scheduled for EGD and colonoscopy later this month.    Past Medical History  Diagnosis Date  . Diabetes mellitus   . Immune deficiency disorder   . Hypertension   . Hypothyroidism   . Bipolar affective   . Seizures    Past Surgical History  Procedure Laterality Date  . Abdominal hysterectomy    . Tonsilectomy, adenoidectomy, bilateral myringotomy and tubes     Family History  Problem Relation Age of Onset  . Diabetes Mother    History  Substance Use Topics  . Smoking status: Former Smoker    Quit date: 02/09/1986  . Smokeless tobacco: Never Used  . Alcohol Use: 0.5 oz/week    1 drink(s) per week   OB History   Grav Para Term Preterm Abortions TAB SAB Ect Mult Living                 Review of Systems  Constitutional: Negative for fever, chills, appetite change and fatigue.  Eyes: Negative for photophobia and visual disturbance.  Respiratory: Negative for cough and  shortness of breath.   Cardiovascular: Negative for chest pain and leg swelling.  Gastrointestinal: Positive for nausea, abdominal pain and blood in stool. Negative for vomiting, diarrhea and constipation.  Genitourinary: Negative for dysuria, frequency and decreased urine volume.  Musculoskeletal: Negative for arthralgias, back pain, gait problem and myalgias.  Skin: Negative for color change and wound.  Neurological: Negative for dizziness, syncope, light-headedness and headaches.  Psychiatric/Behavioral: Negative for confusion and agitation.  All other systems reviewed and are negative.    Allergies  Lactose intolerance (gi); Vicodin; and Neurontin  Home Medications   Current Outpatient Rx  Name  Route  Sig  Dispense  Refill  . aspirin EC 81 MG tablet   Oral   Take 81 mg by mouth daily.         [name] atorvastatin (LIPITOR) 40 MG tablet   Oral   Take 1 tablet (40 mg total) by mouth daily.   90 tablet   3   . calcium carbonate (OS-CAL) 600 MG TABS   Oral   Take 600 mg by mouth 2 (two) times daily with a meal.         . cyclobenzaprine (FLEXERIL) 5 MG tablet   Oral   Take 1-2  tablets (5-10 mg total) by mouth 3 (three) times daily as needed for muscle spasms.   90 tablet   1   . insulin glargine (LANTUS) 100 units/mL SOLN      Inject 50 units each morning and 20 units each evening,   3 mL   11   . levothyroxine (SYNTHROID, LEVOTHROID) 175 MCG tablet   Oral   Take 1 tablet (175 mcg total) by mouth daily.   90 tablet   3   . meloxicam (MOBIC) 7.5 MG tablet   Oral   Take 1-2 tablets (7.5-15 mg total) by mouth daily.   30 tablet   1   . metoprolol (TOPROL-XL) 200 MG 24 hr tablet   Oral   Take 1 tablet (200 mg total) by mouth daily.   90 tablet   3   . pantoprazole (PROTONIX) 40 MG tablet   Oral   Take 1 tablet (40 mg total) by mouth daily.   90 tablet   3   . polyethylene glycol powder (GLYCOLAX/MIRALAX) powder   Oral   Take 17 g by mouth 2 (two)  times daily as needed.   3350 g   1   . potassium chloride SA (K-DUR,KLOR-CON) 20 MEQ tablet   Oral   Take 1 tablet (20 mEq total) by mouth daily.   90 tablet   3   . senna (SENOKOT) 8.6 MG tablet   Oral   Take 2 tablets (17.2 mg total) by mouth 2 (two) times daily. Once achieve daily BM decrease to one tablet daily   30 tablet   0   . sodium phosphate (FLEET) enema   Rectal   Place 1 enema rectally once. follow package directions   135 mL   0   . valsartan (DIOVAN) 160 MG tablet   Oral   Take 1 tablet (160 mg total) by mouth daily.   90 tablet   3    BP 148/69  Pulse 87  Temp(Src) 98.7 F (37.1 C) (Oral)  Resp 16  SpO2 94% Physical Exam  Nursing note and vitals reviewed. Constitutional: She is oriented to person, place, and time. No distress.  Elderly female, laying in bed, appears comfortable.   HENT:  Head: Normocephalic and atraumatic.  Mouth/Throat: Oropharynx is clear and moist.  Eyes: Conjunctivae and EOM are normal. Pupils are equal, round, and reactive to light.  Neck: Normal range of motion. Neck supple.  Cardiovascular: Normal rate, regular rhythm, normal heart sounds and intact distal pulses.   Pulmonary/Chest: Effort normal and breath sounds normal. No respiratory distress.  Abdominal: Soft. Bowel sounds are normal. There is tenderness in the periumbilical area and left lower quadrant. There is no rebound and no guarding.  Musculoskeletal: Normal range of motion. She exhibits no edema and no tenderness.  Neurological: She is alert and oriented to person, place, and time. No cranial nerve deficit. Coordination normal.  Skin: Skin is warm and dry. No rash noted.  Psychiatric: She has a normal mood and affect. Her behavior is normal.    ED Course  Procedures (including critical care time) Labs Review Labs Reviewed  CBC WITH DIFFERENTIAL - Abnormal; Notable for the following:    Hemoglobin 11.8 (*)    HCT 34.9 (*)    All other components within  normal limits  COMPREHENSIVE METABOLIC PANEL - Abnormal; Notable for the following:    Glucose, Bld 227 (*)    Albumin 3.4 (*)    Total Bilirubin 0.2 (*)  GFR calc non Af Amer 59 (*)    GFR calc Af Amer 69 (*)    All other components within normal limits  LIPASE, BLOOD   Imaging Review No results found.  EKG Interpretation   None       MDM   1. Constipation    73 yo F with history of DM, HTN, hypothyroidism and seizures who presents with abdominal pain. Long history of the same. She is well appearing, HD stable, afebrile, low suspicion for intraabdominal infection. I suspect constipation is the cause of her symptoms today. Low suspicion for SBO as she continues to tolerate PO normally, passing flatus. Labs reassuring: WBC 9.1, Hgb 11.8, normal lytes and cr. Glucose 227 but CO2 30. Lipase 14. Doubt GI bleed as her Hgb is stable, HD stable. She was given a soap suds enema, she had a large BM. Her symptoms markedly improved. Abdomen no longer tender. Given her improvement, felt she was stable for outpatient management. I had a long discussion with the patient and her daughter about constipation, importance of being active and drinking water. Advised her to add colace to senna. She is scheduled for outpatient colonoscopy in a few weeks. She was given strict return precautions. The patient and her daughter were in agreement with plan.   Reviewed labs and previous medical records, utilized in MDM  Discussed case with Dr. Tomi Bamberger  Clinical Impression 1. Abdominal pain due to constipation.     Louretta Shorten, MD 11/02/13 [phone]

## 2013-11-01 NOTE — ED Notes (Signed)
The pt has had abd  Pain for 2-3 months with constipation.  She has been here numerus times for the same.  She is not getting any better.  Her last bm was one week ago. For the past week she has had bloody bms.  She is scheduled for an upper and lower endos this month.  Her pain is worse at night

## 2013-11-01 NOTE — Discharge Instructions (Signed)
Continue to take Senokot as directed by your doctor. Start taking Colace 100mg  once daily as well, this will help you have soft bowel movements.   Constipation, Adult Constipation is when a person:  Poops (bowel movement) less than 3 times a week.  Has a hard time pooping.  Has poop that is dry, hard, or bigger than normal. HOME CARE   Eat more fiber, such as fruits, vegetables, whole grains like brown rice, and beans.  Eat less fatty foods and sugar. This includes [place] fries, hamburgers, cookies, candy, and soda.  If you are not getting enough fiber from food, take products with added fiber in them (supplements).  Drink enough fluid to keep your pee (urine) clear or pale yellow.  Go to the restroom when you feel like you need to poop. Do not hold it.  Only take medicine as told by your doctor. Do not take medicines that help you poop (laxatives) without talking to your doctor first.  Exercise on a regular basis, or as told by your doctor. GET HELP RIGHT AWAY IF:   You have bright red blood in your poop (stool).  Your constipation lasts more than 4 days or gets worse.  You have belly (abdomen) or butt (rectal) pain.  You have thin poop (as thin as a pencil).  You lose weight, and it cannot be explained. MAKE SURE YOU:   Understand these instructions.  Will watch your condition.  Will get help right away if you are not doing well or get worse. Document Released: 03/02/2008 Document Revised: 12/07/2011 Document Reviewed: 06/26/2013 [facility] Patient Information 2014 [place], [place].

## 2013-11-01 NOTE — ED Notes (Signed)
Knapp MD at bedside  

## 2013-11-01 NOTE — ED Notes (Signed)
Asked family to step out to [place] so pt can receive enema.

## 2013-11-01 NOTE — ED Notes (Signed)
The pain is worse with movement

## 2013-11-02 NOTE — ED Provider Notes (Signed)
I saw and evaluated the patient, reviewed the resident's note and I agree with the findings and plan.  Pt improved after a laxative in the ED. Labs Reviewed  CBC WITH DIFFERENTIAL - Abnormal; Notable for the following:    Hemoglobin 11.8 (*)    HCT 34.9 (*)    All other components within normal limits  COMPREHENSIVE METABOLIC PANEL - Abnormal; Notable for the following:    Glucose, Bld 227 (*)    Albumin 3.4 (*)    Total Bilirubin 0.2 (*)    GFR calc non Af Amer 59 (*)    GFR calc Af Amer 69 (*)    All other components within normal limits  LIPASE, BLOOD   At this time there does not appear to be any evidence of an acute emergency medical condition and the patient appears stable for discharge with appropriate outpatient follow up.   Kathalene Frames, MD 11/02/13 [phone]

## 2013-11-02 NOTE — ED Notes (Signed)
Pt states understanding of discharge instructions 

## 2013-11-03 ENCOUNTER — Ambulatory Visit (INDEPENDENT_AMBULATORY_CARE_PROVIDER_SITE_OTHER): Payer: Medicare HMO | Admitting: Family Medicine

## 2013-11-03 ENCOUNTER — Encounter: Payer: Self-pay | Admitting: Family Medicine

## 2013-11-03 VITALS — BP 142/92 | HR 80 | Temp 97.9°F | Ht 63.0 in | Wt 199.0 lb

## 2013-11-03 DIAGNOSIS — I10 Essential (primary) hypertension: Secondary | ICD-10-CM

## 2013-11-03 DIAGNOSIS — K5900 Constipation, unspecified: Secondary | ICD-10-CM

## 2013-11-03 DIAGNOSIS — R6889 Other general symptoms and signs: Secondary | ICD-10-CM

## 2013-11-03 MED ORDER — DONEPEZIL HCL 5 MG PO TABS: 5.0000 mg | ORAL_TABLET | Freq: Every day | ORAL | Status: DC

## 2013-11-03 NOTE — Patient Instructions (Signed)
You are doing well overall Please try to stay on a regimen to clean out your bowels and have daily bowel movements Please use 2 caps of miralax twice a day, 2 senokot tabs twice a day, please use up to a liter of fluid in your enema, please use the suppositories for a couple of days. Please consider using mineral oils as a last resort Please start the aricept for memory.  Please come back in 4 weeks

## 2013-11-03 NOTE — Assessment & Plan Note (Signed)
Pt not doing home bowel regimen as discussed.  Daughter wp pt today at appt and will assist w/ medications Start miralax BID, senokot BID, large volume enema, suppository, and mineral oil if needed to assist.

## 2013-11-03 NOTE — Assessment & Plan Note (Signed)
Pt w/ GED level education Progressive issue for pt over years.  MOCA score of 22/30.  After lengthy discussion w/ family will start Aricept

## 2013-11-03 NOTE — Assessment & Plan Note (Signed)
Near goal No change today

## 2013-11-03 NOTE — Progress Notes (Signed)
Kristine Boyer is a 73 y.o. female who presents to [facility] today for abd pain  Abd pain: seein in ED on Wed. Received enema w/ large evacuation of stool w/ some improvement. Resolution of pain but now returning. BMs only small hard stool. Not taking miralax or senna as prescribed. Tried enema x1 prior to ED of only 8oz or less of total fluid.   Forgetfullness: oingoing for years. Very forgetfull. Able to perform ADLs. Family very concerned.   The following portions of the patient's history were reviewed and updated as appropriate: allergies, current medications, past medical history, family and social history, and problem list.  Patient is a nonsmoker   Past Medical History  Diagnosis Date  . Diabetes mellitus   . Immune deficiency disorder   . Hypertension   . Hypothyroidism   . Bipolar affective   . Seizures     ROS as above otherwise neg.    Medications reviewed. Current Outpatient Prescriptions  Medication Sig Dispense Refill  . aspirin EC 81 MG tablet Take 81 mg by mouth daily.      [name] atorvastatin (LIPITOR) 40 MG tablet Take 1 tablet (40 mg total) by mouth daily.  90 tablet  3  . calcium carbonate (OS-CAL) 600 MG TABS Take 600 mg by mouth 2 (two) times daily with a meal.      . cyclobenzaprine (FLEXERIL) 5 MG tablet Take 1-2 tablets (5-10 mg total) by mouth 3 (three) times daily as needed for muscle spasms.  90 tablet  1  . donepezil (ARICEPT) 5 MG tablet Take 1 tablet (5 mg total) by mouth at bedtime.  30 tablet  1  . insulin glargine (LANTUS) 100 units/mL SOLN Inject 50 units each morning and 20 units each evening,  3 mL  11  . levothyroxine (SYNTHROID, LEVOTHROID) 175 MCG tablet Take 1 tablet (175 mcg total) by mouth daily.  90 tablet  3  . meloxicam (MOBIC) 7.5 MG tablet Take 1-2 tablets (7.5-15 mg total) by mouth daily.  30 tablet  1  . metoprolol (TOPROL-XL) 200 MG 24 hr tablet Take 1 tablet (200 mg total) by mouth daily.  90 tablet  3  . pantoprazole (PROTONIX) 40 MG tablet  Take 1 tablet (40 mg total) by mouth daily.  90 tablet  3  . polyethylene glycol powder (GLYCOLAX/MIRALAX) powder Take 17 g by mouth 2 (two) times daily as needed.  3350 g  1  . potassium chloride SA (K-DUR,KLOR-CON) 20 MEQ tablet Take 1 tablet (20 mEq total) by mouth daily.  90 tablet  3  . senna (SENOKOT) 8.6 MG tablet Take 2 tablets (17.2 mg total) by mouth 2 (two) times daily. Once achieve daily BM decrease to one tablet daily  30 tablet  0  . sodium phosphate (FLEET) enema Place 1 enema rectally once. follow package directions  135 mL  0  . valsartan (DIOVAN) 160 MG tablet Take 1 tablet (160 mg total) by mouth daily.  90 tablet  3   No current facility-administered medications for this visit.    Exam: BP 142/92  Pulse 80  Temp(Src) 97.9 F (36.6 C) (Oral)  Ht 5' 3"  (1.6 m)  Wt 199 lb (90.266 kg)  BMI 35.26 kg/m2 Gen: Well NAD HEENT: EOMI,  MMM   Results for orders placed during the hospital encounter of 11/01/13 (from the past 72 hour(s))  CBC WITH DIFFERENTIAL     Status: Abnormal   Collection Time    11/01/13  5:12 PM  Result Value Range   WBC 9.1  4.0 - 10.5 K/uL   RBC 4.38  3.87 - 5.11 MIL/uL   Hemoglobin 11.8 (*) 12.0 - 15.0 g/dL   HCT 34.9 (*) 36.0 - 46.0 %   MCV 79.7  78.0 - 100.0 fL   MCH 26.9  26.0 - 34.0 pg   MCHC 33.8  30.0 - 36.0 g/dL   RDW 13.4  11.5 - 15.5 %   Platelets 166  150 - 400 K/uL   Neutrophils Relative % 62  43 - 77 %   Neutro Abs 5.6  1.7 - 7.7 K/uL   Lymphocytes Relative 27  12 - 46 %   Lymphs Abs 2.4  0.7 - 4.0 K/uL   Monocytes Relative 9  3 - 12 %   Monocytes Absolute 0.8  0.1 - 1.0 K/uL   Eosinophils Relative 2  0 - 5 %   Eosinophils Absolute 0.1  0.0 - 0.7 K/uL   Basophils Relative 0  0 - 1 %   Basophils Absolute 0.0  0.0 - 0.1 K/uL  COMPREHENSIVE METABOLIC PANEL     Status: Abnormal   Collection Time    11/01/13  5:12 PM      Result Value Range   Sodium 145  137 - 147 mEq/L   Potassium 4.0  3.7 - 5.3 mEq/L   Chloride 105  96  - 112 mEq/L   CO2 30  19 - 32 mEq/L   Glucose, Bld 227 (*) 70 - 99 mg/dL   BUN 10  6 - 23 mg/dL   Creatinine, Ser 0.94  0.50 - 1.10 mg/dL   Calcium 8.8  8.4 - 10.5 mg/dL   Total Protein 7.5  6.0 - 8.3 g/dL   Albumin 3.4 (*) 3.5 - 5.2 g/dL   AST 20  0 - 37 U/L   ALT 13  0 - 35 U/L   Alkaline Phosphatase 112  39 - 117 U/L   Total Bilirubin 0.2 (*) 0.3 - 1.2 mg/dL   GFR calc non Af Amer 59 (*) >90 mL/min   GFR calc Af Amer 69 (*) >90 mL/min   Comment: (NOTE)     The eGFR has been calculated using the CKD EPI equation.     This calculation has not been validated in all clinical situations.     eGFR's persistently <90 mL/min signify possible Chronic Kidney     Disease.  LIPASE, BLOOD     Status: None   Collection Time    11/01/13  5:12 PM      Result Value Range   Lipase 14  11 - 59 U/L    A/P (as seen in Problem list)  Forgetfulness Pt w/ GED level education Progressive issue for pt over years.  MOCA score of 22/30.  After lengthy discussion w/ family will start Aricept    HYPERTENSION, BENIGN SYSTEMIC Near goal No change today  Unspecified constipation Pt not doing home bowel regimen as discussed.  Daughter wp pt today at appt and will assist w/ medications Start miralax BID, senokot BID, large volume enema, suppository, and mineral oil if needed to assist.

## 2013-11-17 ENCOUNTER — Encounter: Payer: Self-pay | Admitting: Gastroenterology

## 2013-11-17 ENCOUNTER — Telehealth: Payer: Self-pay | Admitting: Family Medicine

## 2013-11-17 ENCOUNTER — Ambulatory Visit (INDEPENDENT_AMBULATORY_CARE_PROVIDER_SITE_OTHER): Payer: Medicare HMO | Admitting: Gastroenterology

## 2013-11-17 VITALS — BP 160/100 | HR 77 | Ht 63.0 in | Wt 193.0 lb

## 2013-11-17 DIAGNOSIS — R109 Unspecified abdominal pain: Secondary | ICD-10-CM

## 2013-11-17 DIAGNOSIS — R1032 Left lower quadrant pain: Secondary | ICD-10-CM

## 2013-11-17 DIAGNOSIS — K5900 Constipation, unspecified: Secondary | ICD-10-CM

## 2013-11-17 DIAGNOSIS — R1319 Other dysphagia: Secondary | ICD-10-CM

## 2013-11-17 MED ORDER — PEG-KCL-NACL-NASULF-NA ASC-C 100 G PO SOLR: 1.0000 | Freq: Once | ORAL | Status: DC

## 2013-11-17 NOTE — Telephone Encounter (Signed)
Son wanted to discuss about mom's health.  Mother there with him and can verify verbally authorization to discuss.  He will have you speak with her while he is also listening.  Please call back asap.

## 2013-11-17 NOTE — Patient Instructions (Signed)
Stop taking your metformin and discuss changing this medication with your PCP. Also follow up with your PCP regarding your left sided pain.   You have been scheduled for a colonoscopy with propofol. Please follow written instructions given to you at your visit today.  Please pick up your prep kit at the pharmacy within the next 1-3 days. If you use inhalers (even only as needed), please bring them with you on the day of your procedure.  You have been scheduled for a Barium Esophogram at [facility] (1st floor of the hospital) on 11/21/13 at 10:00am. Please arrive 15 minutes prior to your appointment for registration. Make certain not to have anything to eat or drink 6 hours prior to your test. If you need to reschedule for any reason, please contact radiology at [phone] to do so. __________________________________________________________________ A barium swallow is an examination that concentrates on views of the esophagus. This tends to be a double contrast exam (barium and two liquids which, when combined, create a gas to distend the wall of the oesophagus) or single contrast (non-ionic iodine based). The study is usually tailored to your symptoms so a good history is essential. Attention is paid during the study to the form, structure and configuration of the esophagus, looking for functional disorders (such as aspiration, dysphagia, achalasia, motility and reflux) EXAMINATION You may be asked to change into a gown, depending on the type of swallow being performed. A radiologist and radiographer will perform the procedure. The radiologist will advise you of the type of contrast selected for your procedure and direct you during the exam. You will be asked to stand, sit or lie in several different positions and to hold a small amount of fluid in your mouth before being asked to swallow while the imaging is performed .In some instances you may be asked to swallow barium coated marshmallows  to assess the motility of a solid food bolus. The exam can be recorded as a digital or video fluoroscopy procedure. POST PROCEDURE It will take 1-2 days for the barium to pass through your system. To facilitate this, it is important, unless otherwise directed, to increase your fluids for the next 24-48hrs and to resume your normal diet.  This test typically takes about 30 minutes to perform. __________________________________________________________________________________  Thank you for choosing me and [facility].  Pricilla Riffle. Dagoberto Ligas., MD., Marval Regal

## 2013-11-17 NOTE — Telephone Encounter (Signed)
Called and discussed w/ son Voiced understanding concerning current condition and possible early dementia

## 2013-11-17 NOTE — Progress Notes (Addendum)
    History of Present Illness: This is a 73 year old female accompanied by 2 family members. She relates a 3 month history of left lower quadrant, left flank and left back pain exacerbated by certain movements and positions. In addition she has constipation. Constipation has been adequately treated with MiraLax. She states every time she takes metformin she has severe diarrhea so she rarely takes it. She has difficulty swallowing very large pills such as potassium and occasionally with large pieces of meat over the past several years. She localizes the symptoms to her neck. 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 She underwent upper endoscopy in 2003 for GERD and it was normal. Her reflux symptoms are well controlled on daily pantoprazole. Denies weight loss,  diarrhea, change in stool caliber, melena, hematochezia, nausea, vomiting, reflux symptoms, chest pain.  Abd/Pelvic CT IMPRESSION:  1. No acute abnormality seen within the abdomen or pelvis.  2. Tiny umbilical hernia, containing only fat.  3. Scattered calcification along the distal abdominal aorta and its branches.   Review of Systems: Pertinent positive and negative review of systems were noted in the above HPI section. All other review of systems were otherwise negative.  Current Medications, Allergies, Past Medical History, Past Surgical History, Family History and Social History were reviewed in [employer] record.  Physical Exam: General: Well developed , well nourished, no acute distress Head: Normocephalic and atraumatic Eyes:  sclerae anicteric, EOMI Ears: Normal auditory acuity Mouth: No deformity or lesions Neck: Supple, no masses or thyromegaly Lungs: Clear throughout to auscultation Heart: Regular rate and rhythm; no murmurs, rubs or bruits Abdomen: Soft, non tender and non distended. No masses,  hepatosplenomegaly or hernias noted. Normal Bowel sounds Rectal: deferred to colonoscopy Musculoskeletal: Symmetrical with no gross deformities  Skin: No lesions on visible extremities Pulses:  Normal pulses noted Extremities: No clubbing, cyanosis, edema or deformities noted Neurological: Alert oriented x 4, grossly nonfocal Cervical Nodes:  No significant cervical adenopathy Inguinal Nodes: No significant inguinal adenopathy Psychological:  Alert and cooperative. Normal mood and affect  Assessment and Recommendations:  1. LLQ, left flank and left back pain. Abdominal/pelvic CT scan unremarkable. The symptoms are clearly exacerbated by movement and positioning-they are musculoskeletal in etiology. Further evaluation with her primary physician.  2. Constipation. Colorectal cancer screening. Continue high fiber diet with adequate daily water intake and MiraLax twice a day as needed. I've advised her to discontinue metformin since it leads to severe diarrhea and to contact her primary physician to notify him that she will be off metformin Schedule colonoscopy. The risks, benefits, and alternatives to colonoscopy with possible biopsy and possible polypectomy were discussed with the patient and they consent to proceed.   3. Chronic GERD. Well controlled on daily pantoprazole.  4. Dysphasia with large pills and large pieces of meat. I suspect this is oropharyngeal. Rule out esophageal disorders Schedule barium esophagram. Schedule endoscopy if indicated by results of barium esophagram.

## 2013-11-21 ENCOUNTER — Ambulatory Visit (HOSPITAL_COMMUNITY): Payer: Medicare HMO

## 2013-11-23 ENCOUNTER — Ambulatory Visit (HOSPITAL_COMMUNITY)
Admission: RE | Admit: 2013-11-23 | Discharge: 2013-11-23 | Disposition: A | Discharge status: Home / Self Care | Payer: Medicare HMO | Source: Ambulatory Visit | Attending: Gastroenterology | Admitting: Gastroenterology

## 2013-11-23 DIAGNOSIS — R1310 Dysphagia, unspecified: Secondary | ICD-10-CM | POA: Insufficient documentation

## 2013-11-23 DIAGNOSIS — R059 Cough, unspecified: Secondary | ICD-10-CM | POA: Insufficient documentation

## 2013-11-23 DIAGNOSIS — M5380 Other specified dorsopathies, site unspecified: Secondary | ICD-10-CM | POA: Insufficient documentation

## 2013-11-23 DIAGNOSIS — R05 Cough: Secondary | ICD-10-CM | POA: Insufficient documentation

## 2013-11-23 DIAGNOSIS — R1319 Other dysphagia: Secondary | ICD-10-CM

## 2013-12-05 ENCOUNTER — Ambulatory Visit: Payer: Medicare HMO | Admitting: Family Medicine

## 2013-12-11 ENCOUNTER — Ambulatory Visit (INDEPENDENT_AMBULATORY_CARE_PROVIDER_SITE_OTHER): Payer: Medicare HMO | Admitting: Family Medicine

## 2013-12-11 ENCOUNTER — Encounter: Payer: Self-pay | Admitting: Family Medicine

## 2013-12-11 VITALS — BP 142/80 | HR 72 | Temp 97.4°F | Wt 196.0 lb

## 2013-12-11 DIAGNOSIS — E119 Type 2 diabetes mellitus without complications: Secondary | ICD-10-CM

## 2013-12-11 DIAGNOSIS — R6889 Other general symptoms and signs: Secondary | ICD-10-CM

## 2013-12-11 DIAGNOSIS — K219 Gastro-esophageal reflux disease without esophagitis: Secondary | ICD-10-CM

## 2013-12-11 DIAGNOSIS — I10 Essential (primary) hypertension: Secondary | ICD-10-CM

## 2013-12-11 LAB — GLUCOSE, CAPILLARY: Glucose-Capillary: 81 mg/dL (ref 70–99)

## 2013-12-11 MED ORDER — METOPROLOL SUCCINATE ER 200 MG PO TB24: 200.0000 mg | ORAL_TABLET | Freq: Every day | ORAL | Status: DC
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
## 2013-12-11 NOTE — Assessment & Plan Note (Signed)
This is an area of significant concern. Pt continues to use insulin w/o proper CBG monitoring.  Had frank discussion w/ pt concerning glucose monitoring and insulin dosing. Pt expressed understanding and son to assist in educating pt on proper use of insulin and will assist in setting up monitor.  Glucose 81 today. Pt feels well and took insulin this am. Pt to eat snacks that she has on hand in the car.

## 2013-12-11 NOTE — Assessment & Plan Note (Signed)
Mildly above goal again today Pt informed of metop waiting at pharmacy Pt to restart metop and cont valsartan Cont ASA 81

## 2013-12-11 NOTE — Assessment & Plan Note (Signed)
Cont daily PPI

## 2013-12-11 NOTE — Assessment & Plan Note (Signed)
Pt to defer treatment w/ Aricept at this time Discussed how this needs to be her decision not the families. Pt may start if she feels particularly forgetful. Aware that this may take some time to fully work

## 2013-12-11 NOTE — Patient Instructions (Signed)
You are doing well overall Please make sure to take time for yourself Please get your sugar meter up and working and check your sugar every morning Please follow your titration instructions for your insulin Please bring in all medications you have at home for review Please come back in 4 weeks for a diabetes check Please restart your metoprolol Please have a goal of one bowel movement daily

## 2013-12-22 ENCOUNTER — Telehealth: Payer: Self-pay | Admitting: Family Medicine

## 2013-12-22 NOTE — Telephone Encounter (Signed)
Pt called and needs her meter today. She would like Dr. Marily Memos to call the pharmacy and explain the situation. jw

## 2013-12-22 NOTE — Telephone Encounter (Signed)
Pt needs a rx for a meter and her medication sent to [facility].  I asked her which medications she needed and she states that you should now what she needs refilled.   Apparently they have the list of all her medications but just need to know what she needs now.  Please advise. Loan Oguin,CMA

## 2013-12-23 MED ORDER — ONETOUCH ULTRA SYSTEM W/DEVICE KIT: 1.0000 | PACK | Freq: Once | Status: DC

## 2013-12-23 NOTE — Telephone Encounter (Signed)
Glucose meter sent in.  At last visit pt had stated she did not need one and she and son would go pick one up at the pharmacy Other meds refilled at last visit o stated they were not needed  Linna Darner, MD Family Medicine PGY-3 12/23/2013, 1:14 PM

## 2013-12-26 ENCOUNTER — Telehealth: Payer: Self-pay | Admitting: *Deleted

## 2013-12-26 NOTE — Telephone Encounter (Signed)
Message copied by Valerie Roys on Tue Dec 26, 2013  2:05 PM ------      Message from: [facility], DAVID J      Created: Tue Dec 26, 2013 12:29 PM      Regarding: FW: What's the next step w/ this pt?      Contact: [phone]       Not sure what she needs but can you call to clarify. No new labs to review?????                  ----- Message -----         From: Kennith Center, RD         Sent: 12/25/2013   4:04 PM           To: Waldemar Dickens, MD      Subject: What's the next step w/ this pt?                         Waunita Schooner,       Ms. Dusseau called me b/c someone (?) told her she should be on a special diet for the spurs in her neck!  She asked me to ask you to contact her re. results of recent tests.  Pls advise!      Thanks,       Jeannie       ------

## 2013-12-26 NOTE — Telephone Encounter (Signed)
Prior Authorization received from [place] for Cyclobenzaprine HCl 5 mg tab. Formulary and PA form placed in provider box for completion. Possible alternative is baclofen.   Derl Barrow, RN

## 2013-12-26 NOTE — Telephone Encounter (Signed)
LM for patient to call back.  She has had no recent labs since February and not sure what kind of diet she is needing.  Per nutritionist she is also unsure of a diet for bone spurs.  Dao Mearns,CMA

## 2013-12-27 MED ORDER — BACLOFEN 10 MG PO TABS
10.0000 mg | ORAL_TABLET | Freq: Three times a day (TID) | ORAL | Status: DC | PRN
Start: 2013-12-27 — End: 2016-06-20

## 2013-12-27 NOTE — Telephone Encounter (Signed)
Rx changed to baclofen

## 2013-12-27 NOTE — Telephone Encounter (Signed)
Flexeril changed to baclofen due to insurance coverage

## 2014-01-01 ENCOUNTER — Telehealth: Payer: Self-pay | Admitting: Family Medicine

## 2014-01-01 NOTE — Telephone Encounter (Signed)
Please send all rxs to [facility] mail order pharmacy.

## 2014-01-01 NOTE — Telephone Encounter (Signed)
I did this sometime 1-2 weeks ago - al to [facility] Which prescriptions does she need specifically?

## 2014-01-01 NOTE — Telephone Encounter (Signed)
Spoke with pharmacist Leah to clarify baclofen rx and to call in thyroid rx due to them not having it on file.  She states that they just mailed out medications for patient on 12-26-13.  Pt is aware and states that she has received 3 rxs.  Jazmin Hartsell,CMA

## 2014-01-09 ENCOUNTER — Encounter: Payer: Self-pay | Admitting: Family Medicine

## 2014-01-09 ENCOUNTER — Ambulatory Visit (INDEPENDENT_AMBULATORY_CARE_PROVIDER_SITE_OTHER): Payer: Commercial Managed Care - HMO | Admitting: Family Medicine

## 2014-01-09 VITALS — BP 150/70 | HR 61 | Temp 98.0°F | Wt 194.0 lb

## 2014-01-09 DIAGNOSIS — R1310 Dysphagia, unspecified: Secondary | ICD-10-CM

## 2014-01-09 DIAGNOSIS — R6889 Other general symptoms and signs: Secondary | ICD-10-CM

## 2014-01-09 DIAGNOSIS — E039 Hypothyroidism, unspecified: Secondary | ICD-10-CM

## 2014-01-09 DIAGNOSIS — I10 Essential (primary) hypertension: Secondary | ICD-10-CM

## 2014-01-09 DIAGNOSIS — E119 Type 2 diabetes mellitus without complications: Secondary | ICD-10-CM

## 2014-01-09 LAB — POCT GLYCOSYLATED HEMOGLOBIN (HGB A1C): HEMOGLOBIN A1C: 6.5

## 2014-01-09 NOTE — Assessment & Plan Note (Signed)
Seeing GI. improving

## 2014-01-09 NOTE — Assessment & Plan Note (Signed)
Continue current dose of synthroid 

## 2014-01-09 NOTE — Patient Instructions (Signed)
YOU ARE DOING FANTASTIC Please continue to diet and exercise Please call me if your sugar remains above 200 for more than 7 days or is above 300 on any given day as you may need to go back on insulin Please come back to see me prior to July for your next appointment as we may need to change your blood pressure

## 2014-01-09 NOTE — Progress Notes (Signed)
Kristine Boyer is a 73 y.o. female who presents to [facility] today for f/u for DM:    DM: found meter. Sugars in the mid to low 100s. Not taking insulin. Controlling diet.   Memory: took aricept w/o any change   HTN: taking medications. No CP.   Thyroid: taking synthroid. No palpitations    The following portions of the patient's history were reviewed and updated as appropriate: allergies, current medications, past medical history, family and social history, and problem list.  Patient is a nonsmoker.  Past Medical History  Diagnosis Date  . Diabetes mellitus   . Immune deficiency disorder   . Hypertension   . Hypothyroidism   . Bipolar affective   . Seizures   . Alzheimer disease     ROS as above otherwise neg.    Medications reviewed. Current Outpatient Prescriptions  Medication Sig Dispense Refill  . atorvastatin (LIPITOR) 40 MG tablet Take 1 tablet (40 mg total) by mouth daily.  90 tablet  3  . baclofen (LIORESAL) 10 MG tablet Take 1 tablet (10 mg total) by mouth 3 (three) times daily as needed for muscle spasms.  30 each  3  . aspirin EC 81 MG tablet Take 81 mg by mouth daily.      . Blood Glucose Monitoring Suppl (ONE TOUCH ULTRA SYSTEM KIT) W/DEVICE KIT 1 kit by Does not apply route once.  1 each  0  . calcium carbonate (OS-CAL) 600 MG TABS Take 600 mg by mouth 2 (two) times daily with a meal.      . insulin glargine (LANTUS) 100 units/mL SOLN Inject 50 units each morning and 20 units each evening,  3 mL  11  . levothyroxine (SYNTHROID, LEVOTHROID) 175 MCG tablet Take 1 tablet (175 mcg total) by mouth daily.  90 tablet  3  . meloxicam (MOBIC) 7.5 MG tablet Take 1-2 tablets (7.5-15 mg total) by mouth daily.  30 tablet  1  . metoprolol (TOPROL-XL) 200 MG 24 hr tablet Take 1 tablet (200 mg total) by mouth daily.  90 tablet  3  . pantoprazole (PROTONIX) 40 MG tablet Take 1 tablet (40 mg total) by mouth daily.  90 tablet  3  . peg 3350 powder (MOVIPREP) 100 G SOLR Take 1 kit (200 g  total) by mouth once.  1 kit  0  . polyethylene glycol powder (GLYCOLAX/MIRALAX) powder Take 17 g by mouth 2 (two) times daily as needed.  3350 g  1  . potassium chloride SA (K-DUR,KLOR-CON) 20 MEQ tablet Take 1 tablet (20 mEq total) by mouth daily.  90 tablet  3  . senna (SENOKOT) 8.6 MG tablet Take 2 tablets (17.2 mg total) by mouth 2 (two) times daily. Once achieve daily BM decrease to one tablet daily  30 tablet  0  . sodium phosphate (FLEET) enema Place 1 enema rectally once. follow package directions  135 mL  0  . valsartan (DIOVAN) 160 MG tablet Take 1 tablet (160 mg total) by mouth daily.  90 tablet  3   No current facility-administered medications for this visit.    Exam:  BP 150/70  Pulse 61  Temp(Src) 98 F (36.7 C) (Oral)  Wt 194 lb (87.998 kg) Gen: Well NAD HEENT: EOMI,  MMM   Results for orders placed in visit on 01/09/14 (from the past 72 hour(s))  POCT GLYCOSYLATED HEMOGLOBIN (HGB A1C)     Status: None   Collection Time    01/09/14  1:55 PM  Result Value Ref Range   Hemoglobin A1C 6.5      A/P (as seen in Problem list)  Dysphagia, unspecified(787.20) Seeing GI. improving  HYPOTHYROIDISM, UNSPECIFIED Continue current dose of synthroid  DIABETES MELLITUS II, UNCOMPLICATED Fantastic decrease in A1c. Pt very motivated adn dieting and exercising No insulin at this time Pt to restart insulin if sugar stays above 200 for greater than 1 wk but to call first.     Forgetfulness DC aricept as pt not taking and no apparent memory concerns at this time.   HYPERTENSION, BENIGN SYSTEMIC Above goal. But no change  Will consider changes at future appt if remains elevated

## 2014-01-09 NOTE — Assessment & Plan Note (Signed)
Above goal. But no change  Will consider changes at future appt if remains elevated

## 2014-01-09 NOTE — Assessment & Plan Note (Signed)
DC aricept as pt not taking and no apparent memory concerns at this time.

## 2014-01-09 NOTE — Assessment & Plan Note (Signed)
Fantastic decrease in A1c. Pt very motivated adn dieting and exercising No insulin at this time Pt to restart insulin if sugar stays above 200 for greater than 1 wk but to call first.

## 2014-01-12 ENCOUNTER — Telehealth: Payer: Self-pay | Admitting: Family Medicine

## 2014-01-12 NOTE — Telephone Encounter (Signed)
Pt called to know what the status was of the forms/papers that were going to be faxed to her insurance company. jw

## 2014-01-12 NOTE — Telephone Encounter (Signed)
Spoke with patient and let her know that we faxed letter to [facility] who is dealing with her insurance company.  She would like to know if you still have to card she gave you with his number on it because she doesn't have his number.  She wants to check and see if he has faxed the paper through and completed his part.  Thanks [employer]

## 2014-01-16 NOTE — Telephone Encounter (Signed)
Don't have the card. Remember her showing me in clinic but thought she put it back in her purse. I don't typically take things like that. Can we provide her with the number you used to call?

## 2014-01-23 ENCOUNTER — Ambulatory Visit (AMBULATORY_SURGERY_CENTER): Payer: Medicare HMO | Admitting: Gastroenterology

## 2014-01-23 ENCOUNTER — Encounter: Payer: Self-pay | Admitting: Gastroenterology

## 2014-01-23 VITALS — BP 179/93 | HR 59 | Temp 97.5°F | Resp 14 | Ht 63.0 in | Wt 193.0 lb

## 2014-01-23 DIAGNOSIS — D126 Benign neoplasm of colon, unspecified: Secondary | ICD-10-CM

## 2014-01-23 DIAGNOSIS — Z1211 Encounter for screening for malignant neoplasm of colon: Secondary | ICD-10-CM

## 2014-01-23 DIAGNOSIS — R1032 Left lower quadrant pain: Secondary | ICD-10-CM

## 2014-01-23 LAB — GLUCOSE, CAPILLARY
Glucose-Capillary: 123 mg/dL — ABNORMAL HIGH (ref 70–99)
Glucose-Capillary: 111 mg/dL — ABNORMAL HIGH (ref 70–99)

## 2014-01-23 MED ORDER — SODIUM CHLORIDE 0.9 % IV SOLN: 500.0000 mL | INTRAVENOUS | Status: DC

## 2014-01-23 NOTE — Progress Notes (Signed)
Called to room to assist during endoscopic procedure.  Patient ID and intended procedure confirmed with present staff. Received instructions for my participation in the procedure from the performing physician.  

## 2014-01-23 NOTE — Patient Instructions (Signed)
Discharge instructions given with verbal understanding. Handouts on polyps and hemorrhoids. Resume previous medications. YOU HAD AN ENDOSCOPIC PROCEDURE TODAY AT THE [facility]: Refer to the procedure report that was given to you for any specific questions about what was found during the examination.  If the procedure report does not answer your questions, please call your gastroenterologist to clarify.  If you requested that your care partner not be given the details of your procedure findings, then the procedure report has been included in a sealed envelope for you to review at your convenience later.  YOU SHOULD EXPECT: Some feelings of bloating in the abdomen. Passage of more gas than usual.  Walking can help get rid of the air that was put into your GI tract during the procedure and reduce the bloating. If you had a lower endoscopy (such as a colonoscopy or flexible sigmoidoscopy) you may notice spotting of blood in your stool or on the toilet paper. If you underwent a bowel prep for your procedure, then you may not have a normal bowel movement for a few days.  DIET: Your first meal following the procedure should be a light meal and then it is ok to progress to your normal diet.  A half-sandwich or bowl of soup is an example of a good first meal.  Heavy or fried foods are harder to digest and may make you feel nauseous or bloated.  Likewise meals heavy in dairy and vegetables can cause extra gas to form and this can also increase the bloating.  Drink plenty of fluids but you should avoid alcoholic beverages for 24 hours.  ACTIVITY: Your care partner should take you home directly after the procedure.  You should plan to take it easy, moving slowly for the rest of the day.  You can resume normal activity the day after the procedure however you should NOT DRIVE or use heavy machinery for 24 hours (because of the sedation medicines used during the test).    SYMPTOMS TO REPORT  IMMEDIATELY: A gastroenterologist can be reached at any hour.  During normal business hours, 8:30 AM to 5:00 PM Monday through Friday, call [phone].  After hours and on weekends, please call the GI answering service at [phone] who will take a message and have the physician on call contact you.   Following lower endoscopy (colonoscopy or flexible sigmoidoscopy):  Excessive amounts of blood in the stool  Significant tenderness or worsening of abdominal pains  Swelling of the abdomen that is new, acute  Fever of 100F or higher  FOLLOW UP: If any biopsies were taken you will be contacted by phone or by letter within the next 1-3 weeks.  Call your gastroenterologist if you have not heard about the biopsies in 3 weeks.  Our staff will call the home number listed on your records the next business day following your procedure to check on you and address any questions or concerns that you may have at that time regarding the information given to you following your procedure. This is a courtesy call and so if there is no answer at the home number and we have not heard from you through the emergency physician on call, we will assume that you have returned to your regular daily activities without incident.  SIGNATURES/CONFIDENTIALITY: You and/or your care partner have signed paperwork which will be entered into your electronic medical record.  These signatures attest to the fact that that the information above on your After Visit Summary   has been reviewed and is understood.  Full responsibility of the confidentiality of this discharge information lies with you and/or your care-partner. 

## 2014-01-23 NOTE — Progress Notes (Signed)
Report to pacu rn, vss, bbs=clear 

## 2014-01-23 NOTE — Op Note (Addendum)
[place]  [address] [place], [id number]   COLONOSCOPY PROCEDURE REPORT PATIENT: Kristine, Boyer  MR#: [id number] BIRTHDATE: 1941-03-04 , 72  yrs. old GENDER: Female ENDOSCOPIST: Ladene Artist, MD, [facility] REFERRED [place]:YWVPX Merrell, M.D. PROCEDURE DATE:  01/23/2014 PROCEDURE:   Colonoscopy with biopsy and snare polypectomy First Screening Colonoscopy - Avg.  risk and is 50 yrs.  old or older - No.  Prior Negative Screening - Now for repeat screening. 10 or more years since last screening  History of Adenoma - Now for follow-up colonoscopy & has been > or = to 3 yrs.  N/A  Polyps Removed Today? Yes. ASA CLASS:   Class II INDICATIONS:average risk screening. MEDICATIONS: MAC sedation, administered by CRNA and propofol (Diprivan) 180mg  IV DESCRIPTION OF PROCEDURE:   After the risks benefits and alternatives of the procedure were thoroughly explained, informed consent was obtained.  A digital rectal exam revealed no abnormalities of the rectum.   The LB [id number]  endoscope was introduced through the anus and advanced to the cecum, which was identified by both the appendix and ileocecal valve. No adverse events experienced.   The quality of the prep was excellent, using MoviPrep  The instrument was then slowly withdrawn as the colon was fully examined.  COLON FINDINGS: Two sessile polyps measuring 5 mm in size were found at the cecum and in the ascending colon.  A polypectomy was performed with cold forceps.  The resection was complete and the polyp tissue was completely retrieved. A sessile polyp measuring 6 mm in size was found in the rectum.  A polypectomy was performed with a cold snare.  The resection was complete and the polyp tissue was completely retrieved.   Medium sized lipoma was found in the transverse colon.   The colon was otherwise normal.  There was no diverticulosis, inflammation, polyps or cancers unless previously stated. Retroflexed  views revealed small internal hemorrhoids. The time to cecum=4 minutes 08 seconds.  Withdrawal time=11 minutes 30 seconds.  The scope was withdrawn and the procedure completed. COMPLICATIONS: There were no complications. ENDOSCOPIC IMPRESSION: 1.   Two sessile polyps measuring 5 mm at the cecum and in the ascending colon; polypectomy performed with cold forceps 2.   Sessile polyp measuring 6 mm in size in the rectum; polypectomy performed with a cold snare 3.   Medium sized lipoma in the transverse colon 4.   Small internal hemorrhoids  RECOMMENDATIONS: 1.  Await pathology results 2.  Repeat colonoscopy in 5 years if polyp(s) adenomatous; otherwise, given your age, you will not need another colonoscopy for colon cancer screening or polyp surveillance.  These types of tests usually stop around the age 54.  eSigned:  Ladene Artist, MD, [facility] 01/23/2014 11:02 AM

## 2014-01-24 ENCOUNTER — Telehealth: Payer: Self-pay | Admitting: *Deleted

## 2014-01-24 NOTE — Telephone Encounter (Signed)
Number unavailable, follow-up

## 2014-01-29 ENCOUNTER — Encounter: Payer: Self-pay | Admitting: Gastroenterology

## 2014-02-12 ENCOUNTER — Telehealth: Payer: Self-pay | Admitting: *Deleted

## 2014-02-12 ENCOUNTER — Encounter: Payer: Self-pay | Admitting: Family Medicine

## 2014-02-12 ENCOUNTER — Other Ambulatory Visit: Payer: Self-pay

## 2014-02-12 ENCOUNTER — Ambulatory Visit (INDEPENDENT_AMBULATORY_CARE_PROVIDER_SITE_OTHER): Payer: Medicare HMO | Admitting: Family Medicine

## 2014-02-12 VITALS — BP 162/70 | HR 73 | Temp 99.2°F | Wt 187.0 lb

## 2014-02-12 DIAGNOSIS — E119 Type 2 diabetes mellitus without complications: Secondary | ICD-10-CM

## 2014-02-12 DIAGNOSIS — K219 Gastro-esophageal reflux disease without esophagitis: Secondary | ICD-10-CM

## 2014-02-12 DIAGNOSIS — Z1231 Encounter for screening mammogram for malignant neoplasm of breast: Secondary | ICD-10-CM

## 2014-02-12 DIAGNOSIS — E2839 Other primary ovarian failure: Secondary | ICD-10-CM

## 2014-02-12 DIAGNOSIS — I10 Essential (primary) hypertension: Secondary | ICD-10-CM

## 2014-02-12 MED ORDER — METOPROLOL SUCCINATE ER 200 MG PO TB24: 200.0000 mg | ORAL_TABLET | Freq: Every day | ORAL | Status: DC

## 2014-02-12 MED ORDER — VALSARTAN 160 MG PO TABS: 160.0000 mg | ORAL_TABLET | Freq: Every day | ORAL | Status: DC

## 2014-02-12 NOTE — Assessment & Plan Note (Signed)
Elevated today but not taking any BP medications. Refilled today

## 2014-02-12 NOTE — Assessment & Plan Note (Signed)
Doing well but unlikely to be able wean as has failed recently.  Bone scan for preventative maintenance and due to long term ppi use.

## 2014-02-12 NOTE — Telephone Encounter (Signed)
Received message from [place] needing a call back to change diagnosis for the Bone Density Test that was ordered. Please call [phone].  Derl Barrow, RN

## 2014-02-12 NOTE — Progress Notes (Signed)
Kristine Boyer is a 73 y.o. female who presents to [facility] today for HTN f/u  HTN: not taking metoprolol or valsartan. Occasional HA. Denies CP, SOB, palpitations.   DM: CBG in am around 130-150 range. Continues to be off insulin.   GERD: doing well. Tried to wean off protonix w/ return of symptoms.   The following portions of the patient's history were reviewed and updated as appropriate: allergies, current medications, past medical history, family and social history, and problem list.  Patient is a nonsmoker.  Past Medical History  Diagnosis Date  . Diabetes mellitus   . Immune deficiency disorder   . Hypertension   . Hypothyroidism   . Bipolar affective   . Alzheimer disease   . Allergy   . Anemia   . Blood transfusion without reported diagnosis   . Cataract   . GERD (gastroesophageal reflux disease)   . Seizures   . Hyperlipidemia     ROS as above otherwise neg.    Medications reviewed. Current Outpatient Prescriptions  Medication Sig Dispense Refill  . aspirin EC 81 MG tablet Take 81 mg by mouth daily.      [name] atorvastatin (LIPITOR) 40 MG tablet Take 1 tablet (40 mg total) by mouth daily.  90 tablet  3  . baclofen (LIORESAL) 10 MG tablet Take 1 tablet (10 mg total) by mouth 3 (three) times daily as needed for muscle spasms.  30 each  3  . Blood Glucose Monitoring Suppl (ONE TOUCH ULTRA SYSTEM KIT) W/DEVICE KIT 1 kit by Does not apply route once.  1 each  0  . calcium carbonate (OS-CAL) 600 MG TABS Take 600 mg by mouth 2 (two) times daily with a meal.      . levothyroxine (SYNTHROID, LEVOTHROID) 175 MCG tablet Take 1 tablet (175 mcg total) by mouth daily.  90 tablet  3  . meloxicam (MOBIC) 7.5 MG tablet Take 1-2 tablets (7.5-15 mg total) by mouth daily.  30 tablet  1  . metoprolol (TOPROL-XL) 200 MG 24 hr tablet Take 1 tablet (200 mg total) by mouth daily.  90 tablet  3  . pantoprazole (PROTONIX) 40 MG tablet Take 1 tablet (40 mg total) by mouth daily.  90 tablet  3  .  polyethylene glycol powder (GLYCOLAX/MIRALAX) powder Take 17 g by mouth 2 (two) times daily as needed.  3350 g  1  . potassium chloride SA (K-DUR,KLOR-CON) 20 MEQ tablet Take 1 tablet (20 mEq total) by mouth daily.  90 tablet  3  . senna (SENOKOT) 8.6 MG tablet Take 2 tablets (17.2 mg total) by mouth 2 (two) times daily. Once achieve daily BM decrease to one tablet daily  30 tablet  0  . sodium phosphate (FLEET) enema Place 1 enema rectally once. follow package directions  135 mL  0  . valsartan (DIOVAN) 160 MG tablet Take 1 tablet (160 mg total) by mouth daily.  90 tablet  3   No current facility-administered medications for this visit.     Exam:  BP 162/70  Pulse 73  Temp(Src) 99.2 F (37.3 C) (Oral)  Wt 187 lb (84.823 kg) Gen: Well NAD HEENT: EOMI,  MMM   No results found for this or any previous visit (from the past 72 hour(s)).  A/P (as seen in Problem list)  HYPERTENSION, BENIGN SYSTEMIC Elevated today but not taking any BP medications. Refilled today   GASTROESOPHAGEAL REFLUX, NO ESOPHAGITIS Doing well but unlikely to be able wean as has failed  recently.  Bone scan for preventative maintenance and due to long term ppi use.     DIABETES MELLITUS II, UNCOMPLICATED Off insulin and all oral medications A1c in July

## 2014-02-12 NOTE — Assessment & Plan Note (Signed)
Off insulin and all oral medications A1c in July

## 2014-02-12 NOTE — Patient Instructions (Signed)
You are doing great Please take your medications as outlined Please come back in early July to meet your new doctor and to have your next diabetes check Come back sooner if needed.

## 2014-02-20 NOTE — Addendum Note (Signed)
Addended by: Marily Memos, Lashanna Angelo J on: 02/20/2014 12:36 PM   Modules accepted: Orders

## 2014-02-20 NOTE — Telephone Encounter (Signed)
Spoke to staff at [facility].  Changed order to reflect estrogen deficiency as Dx code for procedure

## 2014-03-21 ENCOUNTER — Ambulatory Visit (INDEPENDENT_AMBULATORY_CARE_PROVIDER_SITE_OTHER): Payer: Commercial Managed Care - HMO | Admitting: Family Medicine

## 2014-03-21 ENCOUNTER — Encounter: Payer: Self-pay | Admitting: Family Medicine

## 2014-03-21 VITALS — BP 142/78 | HR 65 | Temp 98.1°F | Wt 191.0 lb

## 2014-03-21 DIAGNOSIS — R300 Dysuria: Secondary | ICD-10-CM

## 2014-03-21 DIAGNOSIS — I10 Essential (primary) hypertension: Secondary | ICD-10-CM

## 2014-03-21 DIAGNOSIS — E118 Type 2 diabetes mellitus with unspecified complications: Secondary | ICD-10-CM

## 2014-03-21 HISTORY — DX: Dysuria: R30.0

## 2014-03-21 LAB — POCT URINALYSIS DIPSTICK
Bilirubin, UA: NEGATIVE
Blood, UA: NEGATIVE
Glucose, UA: NEGATIVE
KETONES UA: NEGATIVE
Nitrite, UA: NEGATIVE
PH UA: 6
Protein, UA: 30
SPEC GRAV UA: 1.02
Urobilinogen, UA: 0.2

## 2014-03-21 LAB — POCT UA - MICROSCOPIC ONLY

## 2014-03-21 MED ORDER — PHENAZOPYRIDINE HCL 100 MG PO TABS: 100.0000 mg | ORAL_TABLET | Freq: Three times a day (TID) | ORAL | Status: DC | PRN

## 2014-03-21 MED ORDER — OXYBUTYNIN CHLORIDE 5 MG PO TABS: 5.0000 mg | ORAL_TABLET | Freq: Three times a day (TID) | ORAL | Status: DC

## 2014-03-21 NOTE — Patient Instructions (Signed)
You are doing well overall There is no sign of bladder infection but you are suffering from urge and stress incontinence The pyridium will help with the pain The oxybutinin will help with the urge to urinate. Stop this if it preventsyou from urinating Follow up with Dr. Lonny Prude in 1 month Have a blessed day.

## 2014-03-21 NOTE — Assessment & Plan Note (Signed)
At goal.  No change 

## 2014-03-21 NOTE — Assessment & Plan Note (Signed)
Unlikely UTI. Improving so no need for ABX Pyridium for relief Urge incontinence - oxybutinin Stress incontinence - Kegal exercises Call if dysuria worsens to start ABX

## 2014-03-21 NOTE — Progress Notes (Signed)
Kristine Boyer is a 73 y.o. female who presents to [facility] today for dysuria and referral  UTI: stinking urine, dysuria, and frequency. 5x at night. Urgency. Incontinence. Some lower back pain. ongiong for 7 days and improving overall w/ increase water intake.   Referral for eye doctor: pt trying to see optho but needs referral. Needs DM eye exam. Unable to make it to last appt due to infection. Now resolved.   HTN: No CP, sob, syncope, palpitations, taking all medications  The following portions of the patient's history were reviewed and updated as appropriate: allergies, current medications, past medical history, family and social history, and problem list.  Patient is a nonsmoker  Past Medical History  Diagnosis Date  . Diabetes mellitus   . Immune deficiency disorder   . Hypertension   . Hypothyroidism   . Bipolar affective   . Alzheimer disease   . Allergy   . Anemia   . Blood transfusion without reported diagnosis   . Cataract   . GERD (gastroesophageal reflux disease)   . Seizures   . Hyperlipidemia     ROS as above otherwise neg.    Medications reviewed. Current Outpatient Prescriptions  Medication Sig Dispense Refill  . aspirin EC 81 MG tablet Take 81 mg by mouth daily.      [name] atorvastatin (LIPITOR) 40 MG tablet Take 1 tablet (40 mg total) by mouth daily.  90 tablet  3  . baclofen (LIORESAL) 10 MG tablet Take 1 tablet (10 mg total) by mouth 3 (three) times daily as needed for muscle spasms.  30 each  3  . Blood Glucose Monitoring Suppl (ONE TOUCH ULTRA SYSTEM KIT) W/DEVICE KIT 1 kit by Does not apply route once.  1 each  0  . calcium carbonate (OS-CAL) 600 MG TABS Take 600 mg by mouth 2 (two) times daily with a meal.      . levothyroxine (SYNTHROID, LEVOTHROID) 175 MCG tablet Take 1 tablet (175 mcg total) by mouth daily.  90 tablet  3  . meloxicam (MOBIC) 7.5 MG tablet Take 1-2 tablets (7.5-15 mg total) by mouth daily.  30 tablet  1  . metoprolol (TOPROL-XL) 200 MG 24 hr  tablet Take 1 tablet (200 mg total) by mouth daily.  90 tablet  3  . oxybutynin (DITROPAN) 5 MG tablet Take 1 tablet (5 mg total) by mouth 3 (three) times daily. For overactive bladder  90 tablet  1  . pantoprazole (PROTONIX) 40 MG tablet Take 1 tablet (40 mg total) by mouth daily.  90 tablet  3  . phenazopyridine (PYRIDIUM) 100 MG tablet Take 1 tablet (100 mg total) by mouth 3 (three) times daily as needed for pain.  30 tablet  0  . polyethylene glycol powder (GLYCOLAX/MIRALAX) powder Take 17 g by mouth 2 (two) times daily as needed.  3350 g  1  . potassium chloride SA (K-DUR,KLOR-CON) 20 MEQ tablet Take 1 tablet (20 mEq total) by mouth daily.  90 tablet  3  . senna (SENOKOT) 8.6 MG tablet Take 2 tablets (17.2 mg total) by mouth 2 (two) times daily. Once achieve daily BM decrease to one tablet daily  30 tablet  0  . sodium phosphate (FLEET) enema Place 1 enema rectally once. follow package directions  135 mL  0  . valsartan (DIOVAN) 160 MG tablet Take 1 tablet (160 mg total) by mouth daily.  90 tablet  3   No current facility-administered medications for this visit.  Exam:  BP 142/78  Pulse 65  Temp(Src) 98.1 F (36.7 C) (Oral)  Wt 191 lb (86.637 kg) Gen: Well NAD HEENT: EOMI,  MMM No CVA or suprapubic tenderness to palpation   Results for orders placed in visit on 03/21/14 (from the past 72 hour(s))  POCT URINALYSIS DIPSTICK     Status: Abnormal   Collection Time    03/21/14  4:34 PM      Result Value Ref Range   Color, UA YELLOW     Clarity, UA CLEAR     Glucose, UA NEG     Bilirubin, UA NEG     Ketones, UA NEG     Spec Grav, UA 1.020     Blood, UA NEG     pH, UA 6.0     Protein, UA 30     Urobilinogen, UA 0.2     Nitrite, UA NEG     Leukocytes, UA Trace    POCT UA - MICROSCOPIC ONLY     Status: None   Collection Time    03/21/14  4:34 PM      Result Value Ref Range   WBC, Ur, HPF, POC 1-3     RBC, urine, microscopic RARE     Bacteria, U Microscopic TRACE      Epithelial cells, urine per micros 1-5      A/P (as seen in Problem list)  Dysuria Unlikely UTI. Improving so no need for ABX Pyridium for relief Urge incontinence - oxybutinin Stress incontinence - Kegal exercises Call if dysuria worsens to start ABX   HYPERTENSION, BENIGN SYSTEMIC At goal. No change

## 2014-03-26 ENCOUNTER — Telehealth: Payer: Self-pay | Admitting: *Deleted

## 2014-03-26 NOTE — Telephone Encounter (Signed)
LM for patient to call back.  Original referral for opthalmology was for Dr. Marrion Coy but he is not in network with [facility].  They would like to change her to Dr. Schuyler Amor who is in network.  Please inform patient of this.  If she would like to see someone else then she needs to call her insurance company and see who else is in network.  Thanks [employer]

## 2014-04-25 ENCOUNTER — Telehealth: Payer: Self-pay | Admitting: Family Medicine

## 2014-04-25 NOTE — Telephone Encounter (Signed)
Pt called and would like Janett Billow to call her ASAP. jw

## 2014-04-27 NOTE — Telephone Encounter (Signed)
Pt wanted info on her appt with Dr. Katy Fitch.  Info given . Fleeger, Salome Spotted

## 2014-05-01 LAB — HM DIABETES EYE EXAM

## 2014-05-04 ENCOUNTER — Ambulatory Visit: Payer: Commercial Managed Care - HMO | Admitting: Family Medicine

## 2014-05-14 ENCOUNTER — Encounter: Payer: Self-pay | Admitting: Family Medicine

## 2014-05-14 ENCOUNTER — Ambulatory Visit (INDEPENDENT_AMBULATORY_CARE_PROVIDER_SITE_OTHER): Payer: Commercial Managed Care - HMO | Admitting: Family Medicine

## 2014-05-14 VITALS — BP 168/80 | HR 61 | Temp 98.2°F | Ht 63.0 in | Wt 189.0 lb

## 2014-05-14 DIAGNOSIS — R220 Localized swelling, mass and lump, head: Secondary | ICD-10-CM

## 2014-05-14 DIAGNOSIS — I10 Essential (primary) hypertension: Secondary | ICD-10-CM

## 2014-05-14 DIAGNOSIS — J392 Other diseases of pharynx: Secondary | ICD-10-CM

## 2014-05-14 DIAGNOSIS — R221 Localized swelling, mass and lump, neck: Secondary | ICD-10-CM

## 2014-05-14 DIAGNOSIS — M436 Torticollis: Secondary | ICD-10-CM

## 2014-05-14 DIAGNOSIS — E119 Type 2 diabetes mellitus without complications: Secondary | ICD-10-CM

## 2014-05-14 LAB — BASIC METABOLIC PANEL
BUN: 14 mg/dL (ref 6–23)
CO2: 32 meq/L (ref 19–32)
Calcium: 9 mg/dL (ref 8.4–10.5)
Chloride: 101 mEq/L (ref 96–112)
Creat: 0.86 mg/dL (ref 0.50–1.10)
GLUCOSE: 248 mg/dL — AB (ref 70–99)
POTASSIUM: 3.7 meq/L (ref 3.5–5.3)
Sodium: 138 mEq/L (ref 135–145)

## 2014-05-14 LAB — POCT GLYCOSYLATED HEMOGLOBIN (HGB A1C): Hemoglobin A1C: 8.5

## 2014-05-14 MED ORDER — VALSARTAN 320 MG PO TABS: 320.0000 mg | ORAL_TABLET | Freq: Every day | ORAL | Status: DC

## 2014-05-14 MED ORDER — TRIAMCINOLONE ACETONIDE 55 MCG/ACT NA AERO: 2.0000 | INHALATION_SPRAY | Freq: Every day | NASAL | Status: DC

## 2014-05-14 MED ORDER — CETIRIZINE HCL 10 MG PO TABS: 10.0000 mg | ORAL_TABLET | Freq: Every day | ORAL | Status: DC

## 2014-05-14 NOTE — Patient Instructions (Addendum)
Francene Finders, it was a pleasure seeing you today. Today we talked about your neck, throat, blood pressure and diabetes.   1. Neck: I want you to start using the Baclofen. Please let me know if this helps your neck. If not, we may need to try something else  2. Post-nasal drip: I think the reason you are having the feeling of something in your throat that you need to cough up, especially in the morning, is due to something called post nasal drip. I will prescribe Zyrtec and Nasacort to hopefully help your symptoms.  3. Throat mass: I am unsure what this is. I will get some imaging and refer you to Ear Nose Throat. Someone should give you a call.  4. Blood pressure: your blood pressure was elevated. I will increase your Valsartan to 320mg  daily.  5. Diabetes: Your A1c was elevated today. I have included the value here. We will have to discuss this at your next visit.  Hemoglobin A1C  Date Value Ref Range Status  05/14/2014 8.5   Final  01/09/2014 6.5   Final  10/04/2013 8.8   Final   Please make an appointment to see me in 4 weeks.  If you have any questions or concerns, please do not hesitate to call the office at [phone].  Sincerely,  Cordelia Poche, MD

## 2014-05-14 NOTE — Progress Notes (Signed)
    Subjective   Kristine Boyer is a 73 y.o. female that presents for a follow-up visit for chronic issues.  1. Trouble swallowing: has remained unchanged. Has recently been worked up and was told she had a bone spur in her cervical spine that was pushing against esophagus.  2. Cough: Patient has had a chronic cough for a few months. It is worse in the morning with the feeling of trying to cough something out of her throat. She coughs/clears her throat throughout the day. She has allergies but is not taking medication. No fevers, but has runny nose and sneezing.  3. Neck stiffness: chronic issue. Has not improved. Appears to have been prescribed baclofen but has not used prescription. Pain located on neck and shoulders with difficulty turning her head  4. Hypertension: patient is adherent to medication. Complaints of mild headaches at time. No chest pain or shortness of breath.  Past Medical History  Diagnosis Date  . Diabetes mellitus   . Immune deficiency disorder   . Hypertension   . Hypothyroidism   . Bipolar affective   . Alzheimer disease   . Allergy   . Anemia   . Blood transfusion without reported diagnosis   . Cataract   . GERD (gastroesophageal reflux disease)   . Seizures   . Hyperlipidemia     History  Substance Use Topics  . Smoking status: Former Smoker    Quit date: 02/09/1986  . Smokeless tobacco: Never Used  . Alcohol Use: No   Allergies  Allergen Reactions  . Lactose Intolerance (Gi) Other (See Comments)    constipation  . Vicodin [Hydrocodone-Acetaminophen] Itching  . Neurontin [Gabapentin] Other (See Comments)    Drowsy. Makes her feel "funny"   ROS Per HPI  Objective   BP 168/80  Pulse 61  Temp(Src) 98.2 F (36.8 C) (Oral)  Ht 5\' 3"  (1.6 m)  Wt 189 lb (85.73 kg)  BMI 33.49 kg/m2   General: No distress HEENT: Marble sized mass in back of oropharynx with no ulcerations or heterogenicity. Neck feels tight all around. Decreased right and  left lateral rotation. No masses felt. Respiratory/chest: Clear to auscultation bilaterally Cardiovascular: Regular rate and rhythm  Assessment and Plan   Please refer to problem based charting of assessment and plan

## 2014-05-15 DIAGNOSIS — J392 Other diseases of pharynx: Secondary | ICD-10-CM

## 2014-05-15 HISTORY — DX: Other diseases of pharynx: J39.2

## 2014-05-16 ENCOUNTER — Ambulatory Visit (HOSPITAL_COMMUNITY): Payer: Medicare HMO

## 2014-05-16 ENCOUNTER — Ambulatory Visit (HOSPITAL_COMMUNITY)
Admission: RE | Admit: 2014-05-16 | Discharge: 2014-05-16 | Disposition: A | Discharge status: Home / Self Care | Payer: Medicare HMO | Source: Ambulatory Visit | Attending: Family Medicine | Admitting: Family Medicine

## 2014-05-16 DIAGNOSIS — M436 Torticollis: Secondary | ICD-10-CM | POA: Insufficient documentation

## 2014-05-16 DIAGNOSIS — R221 Localized swelling, mass and lump, neck: Secondary | ICD-10-CM | POA: Diagnosis present

## 2014-05-16 DIAGNOSIS — R220 Localized swelling, mass and lump, head: Secondary | ICD-10-CM | POA: Diagnosis present

## 2014-05-16 DIAGNOSIS — J392 Other diseases of pharynx: Secondary | ICD-10-CM

## 2014-05-16 DIAGNOSIS — M5380 Other specified dorsopathies, site unspecified: Secondary | ICD-10-CM | POA: Insufficient documentation

## 2014-05-16 DIAGNOSIS — R1310 Dysphagia, unspecified: Secondary | ICD-10-CM | POA: Diagnosis present

## 2014-05-16 HISTORY — DX: Torticollis: M43.6

## 2014-05-16 MED ORDER — IOHEXOL 300 MG/ML  SOLN
75.0000 mL | Freq: Once | INTRAMUSCULAR | Status: AC | PRN
  Administered 2014-05-16: 75 mL via INTRAVENOUS

## 2014-05-16 NOTE — Assessment & Plan Note (Signed)
A1C 8.5 today. Will follow-up at next visit.

## 2014-05-16 NOTE — Assessment & Plan Note (Signed)
Appears to be musculoskeletal. Patient has not used prescribed baclofen. Encouraged to use. Hopefully this will improve use of neck.

## 2014-05-16 NOTE — Assessment & Plan Note (Signed)
Unsure what this is. Patient with history of smoking. Finding was incidental, although patient has had history of dysphagia. Will obtain neck CT and refer to ENT for evaluation.

## 2014-05-16 NOTE — Assessment & Plan Note (Signed)
Uncontrolled. Will increase valsartan to 320mg  daily.

## 2014-05-18 ENCOUNTER — Telehealth: Payer: Self-pay | Admitting: Family Medicine

## 2014-05-18 NOTE — Telephone Encounter (Signed)
LM for patient to call back.  All information regarding referral was told to her and documented under the referral.  Please ask patient what she has questions about.  Thanks [employer]

## 2014-05-18 NOTE — Telephone Encounter (Signed)
Pt is calling would like Jazmin to call her back concerning the early call . jw

## 2014-06-01 ENCOUNTER — Telehealth: Payer: Self-pay | Admitting: Family Medicine

## 2014-06-01 NOTE — Telephone Encounter (Signed)
Called patient to update her on results and follow-up on if she has seen ENT. Left message for patient to call back.

## 2014-06-18 ENCOUNTER — Telehealth: Payer: Self-pay | Admitting: Family Medicine

## 2014-06-18 NOTE — Telephone Encounter (Signed)
Pt called and needs a refill on her acid reflux medication called in. This needs to be in 90 day qty for the insurance to pay. jw

## 2014-06-19 MED ORDER — PANTOPRAZOLE SODIUM 40 MG PO TBEC: 40.0000 mg | DELAYED_RELEASE_TABLET | Freq: Every day | ORAL | Status: DC

## 2014-06-19 NOTE — Telephone Encounter (Signed)
LVM for patient to call back. ?

## 2014-06-19 NOTE — Telephone Encounter (Signed)
Protonix refilled

## 2014-06-20 NOTE — Telephone Encounter (Signed)
LVM for patient to call back to inform of below 

## 2014-06-25 NOTE — Telephone Encounter (Signed)
Pt is returning call/Kristine Boyer

## 2014-06-25 NOTE — Telephone Encounter (Signed)
LMOVM that "Dr Lonny Prude called in the refill she requested". Fleeger, Kristine Boyer

## 2014-07-20 ENCOUNTER — Ambulatory Visit (INDEPENDENT_AMBULATORY_CARE_PROVIDER_SITE_OTHER): Payer: Commercial Managed Care - HMO | Admitting: *Deleted

## 2014-07-20 DIAGNOSIS — Z23 Encounter for immunization: Secondary | ICD-10-CM

## 2014-08-09 ENCOUNTER — Encounter: Payer: Self-pay | Admitting: *Deleted

## 2014-08-14 ENCOUNTER — Ambulatory Visit (INDEPENDENT_AMBULATORY_CARE_PROVIDER_SITE_OTHER): Payer: Commercial Managed Care - HMO | Admitting: Gastroenterology

## 2014-08-14 ENCOUNTER — Encounter: Payer: Self-pay | Admitting: Gastroenterology

## 2014-08-14 VITALS — BP 168/92 | HR 72 | Ht 63.0 in | Wt 189.6 lb

## 2014-08-14 DIAGNOSIS — Z860101 Personal history of adenomatous and serrated colon polyps: Secondary | ICD-10-CM

## 2014-08-14 DIAGNOSIS — K648 Other hemorrhoids: Secondary | ICD-10-CM

## 2014-08-14 DIAGNOSIS — Z86010 Personal history of colonic polyps: Secondary | ICD-10-CM

## 2014-08-14 DIAGNOSIS — K5901 Slow transit constipation: Secondary | ICD-10-CM

## 2014-08-14 NOTE — Patient Instructions (Addendum)
Please continue Miralax 1-2 times daily for constipation.  Make sure to drink plenty of fluids, especially water!  Please purchase the following medications over the counter and take as directed: Preparation H suppositories-Use twice daily as needed for hemorrhoids  Please follow a high fiber diet.  Please follow up with your primary care physician for ongoing care of your hemorrhoids.  CC: Cordelia Poche, MD  High-Fiber Diet Fiber is found in fruits, vegetables, and grains. A high-fiber diet encourages the addition of more whole grains, legumes, fruits, and vegetables in your diet. The recommended amount of fiber for adult males is 38 g per day. For adult females, it is 25 g per day. Pregnant and lactating women should get 28 g of fiber per day. If you have a digestive or bowel problem, ask your caregiver for advice before adding high-fiber foods to your diet. Eat a variety of high-fiber foods instead of only a select few type of foods.  PURPOSE  To increase stool bulk.  To make bowel movements more regular to prevent constipation.  To lower cholesterol.  To prevent overeating. WHEN IS THIS DIET USED?  It may be used if you have constipation and hemorrhoids.  It may be used if you have uncomplicated diverticulosis (intestine condition) and irritable bowel syndrome.  It may be used if you need help with weight management.  It may be used if you want to add it to your diet as a protective measure against atherosclerosis, diabetes, and cancer. SOURCES OF FIBER  Whole-grain breads and cereals.  Fruits, such as apples, oranges, bananas, berries, prunes, and pears.  Vegetables, such as green peas, carrots, sweet potatoes, beets, broccoli, cabbage, spinach, and artichokes.  Legumes, such split peas, soy, lentils.  Almonds. FIBER CONTENT IN FOODS Starches and Grains / Dietary Fiber (g)  Cheerios, 1 cup / 3 g  Corn Flakes cereal, 1 cup / 0.7 g  Rice crispy treat cereal, 1  cup / 0.3 g  Instant oatmeal (cooked),  cup / 2 g  Frosted wheat cereal, 1 cup / 5.1 g  Brown, long-grain rice (cooked), 1 cup / 3.5 g  White, long-grain rice (cooked), 1 cup / 0.6 g  Enriched macaroni (cooked), 1 cup / 2.5 g Legumes / Dietary Fiber (g)  Baked beans (canned, plain, or vegetarian),  cup / 5.2 g  Kidney beans (canned),  cup / 6.8 g  Pinto beans (cooked),  cup / 5.5 g Breads and Crackers / Dietary Fiber (g)  Plain or honey graham crackers, 2 squares / 0.7 g  Saltine crackers, 3 squares / 0.3 g  Plain, salted pretzels, 10 pieces / 1.8 g  Whole-wheat bread, 1 slice / 1.9 g  White bread, 1 slice / 0.7 g  Raisin bread, 1 slice / 1.2 g  Plain bagel, 3 oz / 2 g  Flour tortilla, 1 oz / 0.9 g  Corn tortilla, 1 small / 1.5 g  Hamburger or hotdog bun, 1 small / 0.9 g Fruits / Dietary Fiber (g)  Apple with skin, 1 medium / 4.4 g  Sweetened applesauce,  cup / 1.5 g  Banana,  medium / 1.5 g  Grapes, 10 grapes / 0.4 g  Orange, 1 small / 2.3 g  Raisin, 1.5 oz / 1.6 g  Melon, 1 cup / 1.4 g Vegetables / Dietary Fiber (g)  Green beans (canned),  cup / 1.3 g  Carrots (cooked),  cup / 2.3 g  Broccoli (cooked),  cup / 2.8 g  Peas (cooked),  cup / 4.4 g  Mashed potatoes,  cup / 1.6 g  Lettuce, 1 cup / 0.5 g  Corn (canned),  cup / 1.6 g  Tomato,  cup / 1.1 g Document Released: 09/14/2005 Document Revised: 03/15/2012 Document Reviewed: 12/17/2011 [facility] Patient Information 2015 [place], [place]. This information is not intended to replace advice given to you by your health care provider. Make sure you discuss any questions you have with your health care provider.

## 2014-08-14 NOTE — Progress Notes (Signed)
    History of Present Illness: This is a 73 year old female complaining of intermittent small-volume hematochezia constipation. She often notes lower abd pain when she is constipated. She underwent colonoscopy in April 2015 showing small adenomatous colon polyps and moderate sized internal hemorrhoids.  Current Medications, Allergies, Past Medical History, Past Surgical History, Family History and Social History were reviewed in [employer] record.  Physical Exam: General: Well developed , well nourished, no acute distress Head: Normocephalic and atraumatic Eyes:  sclerae anicteric, EOMI Ears: Normal auditory acuity Mouth: No deformity or lesions Lungs: Clear throughout to auscultation Heart: Regular rate and rhythm; no murmurs, rubs or bruits Abdomen: Soft, non tender and non distended. No masses, hepatosplenomegaly or hernias noted. Normal Bowel sounds Musculoskeletal: Symmetrical with no gross deformities  Pulses:  Normal pulses noted Extremities: No clubbing, cyanosis, edema or deformities noted Neurological: Alert oriented x 4, grossly nonfocal Psychological:  Alert and cooperative. Normal mood and affect  Assessment and Recommendations:  1. Intermittent small-volume hematochezia secondary to internal hemorrhoids. Preparation H suppositories twice daily as needed. Follow-up with PCP.  2. Constipation. Increase dietary fiber and water intake. Continue MiraLAX once or twice daily, every day, titrated for adequate bowel movements. Follow-up with PCP.  3. Personal history of adenomatous colon polyps. Surveillance colonoscopy recommended a 5 year interval in April 2020.  4. GERD. Continue pantoprazole 40 mg daily and standard antireflux measures. Follow-up with PCP.

## 2014-09-26 ENCOUNTER — Encounter: Payer: Self-pay | Admitting: Family Medicine

## 2014-09-26 ENCOUNTER — Ambulatory Visit (INDEPENDENT_AMBULATORY_CARE_PROVIDER_SITE_OTHER): Payer: Commercial Managed Care - HMO | Admitting: Family Medicine

## 2014-09-26 VITALS — BP 173/92 | HR 71 | Temp 98.4°F | Ht 63.0 in | Wt 185.9 lb

## 2014-09-26 DIAGNOSIS — M5431 Sciatica, right side: Secondary | ICD-10-CM

## 2014-09-26 DIAGNOSIS — IMO0002 Reserved for concepts with insufficient information to code with codable children: Secondary | ICD-10-CM

## 2014-09-26 DIAGNOSIS — R300 Dysuria: Secondary | ICD-10-CM

## 2014-09-26 DIAGNOSIS — E1165 Type 2 diabetes mellitus with hyperglycemia: Secondary | ICD-10-CM

## 2014-09-26 DIAGNOSIS — I10 Essential (primary) hypertension: Secondary | ICD-10-CM

## 2014-09-26 DIAGNOSIS — N898 Other specified noninflammatory disorders of vagina: Secondary | ICD-10-CM

## 2014-09-26 LAB — POCT WET PREP (WET MOUNT)
CLUE CELLS WET PREP WHIFF POC: NEGATIVE
WBC, Wet Prep HPF POC: >20

## 2014-09-26 LAB — POCT URINALYSIS DIPSTICK
Bilirubin, UA: NEGATIVE
Glucose, UA: NEGATIVE
Ketones, UA: NEGATIVE
Leukocytes, UA: NEGATIVE
NITRITE UA: NEGATIVE
PH UA: 6
PROTEIN UA: 30
RBC UA: NEGATIVE
Spec Grav, UA: 1.02
Urobilinogen, UA: 0.2

## 2014-09-26 LAB — POCT UA - MICROSCOPIC ONLY

## 2014-09-26 LAB — POCT GLYCOSYLATED HEMOGLOBIN (HGB A1C): Hemoglobin A1C: 7.6

## 2014-09-26 MED ORDER — ONETOUCH ULTRA SYSTEM W/DEVICE KIT: 1.0000 | PACK | Freq: Once | Status: DC

## 2014-09-26 MED ORDER — PREGABALIN 50 MG PO CAPS: 50.0000 mg | ORAL_CAPSULE | Freq: Three times a day (TID) | ORAL | Status: DC

## 2014-09-26 NOTE — Progress Notes (Signed)
    Subjective    Kristine Boyer is a 73 y.o. female that presents for an office visit.   1. Leg pain: Symptoms have been present for at least 6 months and have been increasing. No weakness. Some numbness that radiates from right hip down to right foot. Applying pressure stockings helps with symptoms. Standing makes symptoms worse. No falls.  2. Diabetes: Patient has been using an old prescription of Lantus, taking 30 u in the morning and 40 u in the evening until three weeks ago. No nausea, vomiting or seizure activity  3. Urine odor: Associated dysuria, urgency and hesitancy. No fever. No nausea or vomiting. 4. Hypertension: No chest pain, shortness of breath. Adherent with medication  History  Substance Use Topics  . Smoking status: Former Smoker    Quit date: 02/09/1986  . Smokeless tobacco: Never Used  . Alcohol Use: No    Allergies  Allergen Reactions  . Lactose Intolerance (Gi) Other (See Comments)    constipation  . Vicodin [Hydrocodone-Acetaminophen] Itching  . Neurontin [Gabapentin] Other (See Comments)    Drowsy. Makes her feel "funny"    Meds ordered this encounter  Medications  . pregabalin (LYRICA) 50 MG capsule    Sig: Take 1 capsule (50 mg total) by mouth 3 (three) times daily.    Dispense:  90 capsule    Refill:  0  . Blood Glucose Monitoring Suppl (ONE TOUCH ULTRA SYSTEM KIT) W/DEVICE KIT    Sig: 1 kit by Does not apply route once.    Dispense:  1 each    Refill:  0    ROS  Per HPI   Objective   BP 173/92 mmHg  Pulse 71  Temp(Src) 98.4 F (36.9 C) (Oral)  Ht $R'5\' 3"'ME$  (1.6 m)  Wt 185 lb 14.4 oz (84.324 kg)  BMI 32.94 kg/m2  General: Well appearing female. Musculoskeletal: Negative straight leg Neuro: 5/5 LE strength bilaterally. 2+ reflexes bilaterally  Assessment and Plan   Please refer to problem based charting of assessment and plan

## 2014-09-26 NOTE — Patient Instructions (Addendum)
Thank you for coming to see me today. It was a pleasure. Today we talked about:   Leg numbness: your strength was excellent. For your numbness, I will prescribe Lyrica to see if that helps.  Diabetes: I am checking your A1C today. [facility] Please [facility] there to pick it up. I will call you about further management of your diabetes  Urine symptoms: I am checking your urine for infection.  High blood pressure: Your blood pressure was slightly high today. I will not make any changes today and will reassess at our next visit  Please make an appointment to see me in 4 weeks for follow-up.  If you have any questions or concerns, please do not hesitate to call the office at [phone].  Sincerely,  Kristine Poche, MD   Pregabalin capsules What is this medicine? PREGABALIN (pre GAB a lin) is used to treat nerve pain from diabetes, shingles, spinal cord injury, and fibromyalgia. It is also used to control seizures in epilepsy. This medicine may be used for other purposes; ask your health care provider or pharmacist if you have questions. COMMON BRAND NAME(S): Lyrica What should I tell my health care provider before I take this medicine? They need to know if you have any of these conditions: -bleeding problems -heart disease, including heart failure -history of alcohol or drug abuse -kidney disease -suicidal thoughts, plans, or attempt; a previous suicide attempt by you or a family member -an unusual or allergic reaction to pregabalin, gabapentin, other medicines, foods, dyes, or preservatives -pregnant or trying to get pregnant or trying to conceive with your partner -breast-feeding How should I use this medicine? Take this medicine by mouth with a glass of water. Follow the directions on the prescription label. You can take this medicine with or without food. Take your doses at regular intervals. Do not take your medicine more often than directed. Do  not stop taking except on your doctor's advice. A special MedGuide will be given to you by the pharmacist with each prescription and refill. Be sure to read this information carefully each time. Talk to your pediatrician regarding the use of this medicine in children. Special care may be needed. Overdosage: If you think you have taken too much of this medicine contact a poison control center or emergency room at once. NOTE: This medicine is only for you. Do not share this medicine with others. What if I miss a dose? If you miss a dose, take it as soon as you can. If it is almost time for your next dose, take only that dose. Do not take double or extra doses. What may interact with this medicine? -alcohol -certain medicines for blood pressure like captopril, enalapril, or lisinopril -certain medicines for diabetes, like pioglitazone or rosiglitazone -certain medicines for anxiety or sleep -narcotic medicines for pain This list may not describe all possible interactions. Give your health care provider a list of all the medicines, herbs, non-prescription drugs, or dietary supplements you use. Also tell them if you smoke, drink alcohol, or use illegal drugs. Some items may interact with your medicine. What should I watch for while using this medicine? Tell your doctor or healthcare professional if your symptoms do not start to get better or if they get worse. Visit your doctor or health care professional for regular checks on your progress. Do not stop taking except on your doctor's advice. You may develop a severe reaction. Your doctor will tell  you how much medicine to take. Wear a medical identification bracelet or chain if you are taking this medicine for seizures, and carry a card that describes your disease and details of your medicine and dosage times. You may get drowsy or dizzy. Do not drive, use machinery, or do anything that needs mental alertness until you know how this medicine affects you.  Do not stand or sit up quickly, especially if you are an older patient. This reduces the risk of dizzy or fainting spells. Alcohol may interfere with the effect of this medicine. Avoid alcoholic drinks. If you have a heart condition, like congestive heart failure, and notice that you are retaining water and have swelling in your hands or feet, contact your health care provider immediately. The use of this medicine may increase the chance of suicidal thoughts or actions. Pay special attention to how you are responding while on this medicine. Any worsening of mood, or thoughts of suicide or dying should be reported to your health care professional right away. This medicine has caused reduced sperm counts in some men. This may interfere with the ability to father a child. You should talk to your doctor or health care professional if you are concerned about your fertility. Women who become pregnant while using this medicine for seizures may enroll in the [place] Pregnancy Registry by calling [id number]. This registry collects information about the safety of antiepileptic drug use during pregnancy. What side effects may I notice from receiving this medicine? Side effects that you should report to your doctor or health care professional as soon as possible: -allergic reactions like skin rash, itching or hives, swelling of the face, lips, or tongue -breathing problems -changes in vision -chest pain -confusion -jerking or unusual movements of any part of your body -loss of memory -muscle pain, tenderness, or weakness -suicidal thoughts or other mood changes -swelling of the ankles, feet, hands -unusual bruising or bleeding Side effects that usually do not require medical attention (Report these to your doctor or health care professional if they continue or are bothersome.): -dizziness -drowsiness -dry mouth -headache -nausea -tremors -trouble sleeping -weight  gain This list may not describe all possible side effects. Call your doctor for medical advice about side effects. You may report side effects to FDA at 1-800-FDA-1088. Where should I keep my medicine? Keep out of the reach of children. This medicine can be abused. Keep your medicine in a safe place to protect it from theft. Do not share this medicine with anyone. Selling or giving away this medicine is dangerous and against the law. Store at room temperature between 15 and 30 degrees C (59 and 86 degrees F). Throw away any unused medicine after the expiration date. NOTE: This sheet is a summary. It may not cover all possible information. If you have questions about this medicine, talk to your doctor, pharmacist, or health care provider.  2015, [employer]vier/[employer] Standard. (2011-03-19 20:00:36)

## 2014-10-08 DIAGNOSIS — M5430 Sciatica, unspecified side: Secondary | ICD-10-CM

## 2014-10-08 HISTORY — DX: Sciatica, unspecified side: M54.30

## 2014-10-08 NOTE — Assessment & Plan Note (Signed)
No current red flags. No weakness.  Start Lyrica 50mg  TID

## 2014-10-08 NOTE — Assessment & Plan Note (Signed)
Unsure if patient needs to be on Lantus currently as she has not been taking it.  Advised to pick up glucose meter from pharmacy  Will check A1C today  Will call patient regarding updated plan for diabetes management.

## 2014-10-08 NOTE — Assessment & Plan Note (Signed)
Uncontrolled today.  Will reassess at next visit and likely modify regimen at that time.

## 2014-10-08 NOTE — Assessment & Plan Note (Signed)
Symptoms somewhat suggesting UTI, but could also be due to hyper glycemia  Urinalysis today and treat if needed

## 2014-10-17 ENCOUNTER — Telehealth: Payer: Self-pay | Admitting: Internal Medicine

## 2014-10-17 NOTE — Telephone Encounter (Signed)
Patient is having continued constipation.  She has not been taking her Miralax due to a few days of loose stools.  I reviewed with her the importance of increasing the amount of fluid in her diet.  She is asked to follow up with her primary care for ongoing constipation.

## 2014-10-17 NOTE — Telephone Encounter (Signed)
Left message for patient to call back  

## 2014-10-17 NOTE — Telephone Encounter (Signed)
Saw her with SO at colonoscopy and she asked me to relay a message to Dr. Fuller Plan - "he helped me a lot but I need some more advice"  I told her I would have his RN call her and ask her what she needs

## 2014-11-15 ENCOUNTER — Ambulatory Visit: Payer: Commercial Managed Care - HMO | Admitting: Family Medicine

## 2014-11-15 ENCOUNTER — Encounter: Payer: Self-pay | Admitting: Family Medicine

## 2014-11-15 ENCOUNTER — Ambulatory Visit (INDEPENDENT_AMBULATORY_CARE_PROVIDER_SITE_OTHER): Payer: Medicare HMO | Admitting: Family Medicine

## 2014-11-15 VITALS — BP 142/88 | Temp 98.2°F | Ht 63.0 in | Wt 180.0 lb

## 2014-11-15 DIAGNOSIS — E1165 Type 2 diabetes mellitus with hyperglycemia: Secondary | ICD-10-CM

## 2014-11-15 DIAGNOSIS — E2839 Other primary ovarian failure: Secondary | ICD-10-CM | POA: Diagnosis not present

## 2014-11-15 DIAGNOSIS — I10 Essential (primary) hypertension: Secondary | ICD-10-CM | POA: Diagnosis not present

## 2014-11-15 DIAGNOSIS — M5431 Sciatica, right side: Secondary | ICD-10-CM

## 2014-11-15 DIAGNOSIS — IMO0002 Reserved for concepts with insufficient information to code with codable children: Secondary | ICD-10-CM

## 2014-11-15 LAB — CBC WITH DIFFERENTIAL/PLATELET
Basophils Absolute: 0 10*3/uL (ref 0.0–0.1)
Basophils Relative: 0 % (ref 0–1)
Eosinophils Absolute: 0.1 10*3/uL (ref 0.0–0.7)
Eosinophils Relative: 1 % (ref 0–5)
HEMATOCRIT: 37.9 % (ref 36.0–46.0)
HEMOGLOBIN: 12.2 g/dL (ref 12.0–15.0)
LYMPHS PCT: 35 % (ref 12–46)
Lymphs Abs: 2.8 10*3/uL (ref 0.7–4.0)
MCH: 26.6 pg (ref 26.0–34.0)
MCHC: 32.2 g/dL (ref 30.0–36.0)
MCV: 82.8 fL (ref 78.0–100.0)
MONO ABS: 0.6 10*3/uL (ref 0.1–1.0)
MPV: 12.2 fL (ref 8.6–12.4)
Monocytes Relative: 7 % (ref 3–12)
NEUTROS PCT: 57 % (ref 43–77)
Neutro Abs: 4.6 10*3/uL (ref 1.7–7.7)
Platelets: 150 10*3/uL (ref 150–400)
RBC: 4.58 MIL/uL (ref 3.87–5.11)
RDW: 13.9 % (ref 11.5–15.5)
WBC: 8 10*3/uL (ref 4.0–10.5)

## 2014-11-15 LAB — COMPREHENSIVE METABOLIC PANEL
ALBUMIN: 3.9 g/dL (ref 3.5–5.2)
ALT: 11 U/L (ref 0–35)
AST: 17 U/L (ref 0–37)
Alkaline Phosphatase: 111 U/L (ref 39–117)
BUN: 12 mg/dL (ref 6–23)
CHLORIDE: 103 meq/L (ref 96–112)
CO2: 31 mEq/L (ref 19–32)
Calcium: 9 mg/dL (ref 8.4–10.5)
Creat: 0.85 mg/dL (ref 0.50–1.10)
Glucose, Bld: 257 mg/dL — ABNORMAL HIGH (ref 70–99)
POTASSIUM: 3.7 meq/L (ref 3.5–5.3)
Sodium: 141 mEq/L (ref 135–145)
TOTAL PROTEIN: 7.1 g/dL (ref 6.0–8.3)
Total Bilirubin: 0.5 mg/dL (ref 0.2–1.2)

## 2014-11-15 MED ORDER — PREGABALIN 25 MG PO CAPS: 25.0000 mg | ORAL_CAPSULE | Freq: Three times a day (TID) | ORAL | Status: DC

## 2014-11-15 NOTE — Progress Notes (Signed)
    Subjective    Kristine Boyer is a 74 y.o. female that presents for an office visit.   1. Right leg pain: Chronic. She has not had any falls. Lyrica has helped with her pain. She has been more sleepy with the Lyrica. She is taking it once per day, which helps with her pain.  2. Diabetes mellitus: She is not checking her blood sugar. She is not taking Lantus or Novolog. No hypoglycemia. No nausea or vomiting.  3. Hypertension: She has not taking her blood pressure today. No chest pain or shortness of breath.  History  Substance Use Topics  . Smoking status: Former Smoker    Quit date: 02/09/1986  . Smokeless tobacco: Never Used  . Alcohol Use: No    Allergies  Allergen Reactions  . Lactose Intolerance (Gi) Other (See Comments)    constipation  . Vicodin [Hydrocodone-Acetaminophen] Itching  . Neurontin [Gabapentin] Other (See Comments)    Drowsy. Makes her feel "funny"    No orders of the defined types were placed in this encounter.    ROS  Per HPI   Objective   BP 162/90 mmHg  Temp(Src) 98.2 F (36.8 C) (Oral)  Ht 5\' 3"  (1.6 m)  Wt 180 lb (81.647 kg)  BMI 31.89 kg/m2  General: Well appearing Musculoskeletal: See diabetic foot exam Neuro: See diabetic foot exam  Assessment and Plan   Please refer to problem based charting of assessment and plan

## 2014-11-15 NOTE — Patient Instructions (Signed)
Thank you for coming to see me today. It was a pleasure. Today we talked about:   High blood pressure: I will not make any changes to your medication today. We will get lab work today.  Diabetes: Please get your glucose meter. [facility] [address] Please call my office tomorrow with your morning sugars (before eating).  Leg pain: I will refer you to the orthopedic surgeon as you are requesting. I am also cutting down your Lyrica to 25mg  three times per day  Please make an appointment to see me in 4 weeks for follow-up.  If you have any questions or concerns, please do not hesitate to call the office at [phone].  Sincerely,  Cordelia Poche, MD

## 2014-11-16 ENCOUNTER — Encounter: Payer: Self-pay | Admitting: Family Medicine

## 2014-11-16 LAB — TSH: TSH: 5.763 u[IU]/mL — ABNORMAL HIGH (ref 0.350–4.500)

## 2014-11-19 NOTE — Assessment & Plan Note (Signed)
Patient with uncontrolled blood pressure today, although did not take her medication this morning.  Encouraged to take medication as directed  Follow-up at next visit

## 2014-11-19 NOTE — Assessment & Plan Note (Addendum)
Patient not currently taking any insulin. Last A1C of 7.6.  Will have patient obtain her glucose meter  Daily fasting CBGs  Recheck A1C in April

## 2014-11-19 NOTE — Assessment & Plan Note (Signed)
Patient with bulging disc causing her sciatica pain. Patient has improved symptoms with Lyrica, however, medication is making her sleepy.  Reduce to 25mg  TID; if tolerates, can continue at this dose

## 2014-11-21 ENCOUNTER — Telehealth: Payer: Self-pay | Admitting: Family Medicine

## 2014-11-21 NOTE — Telephone Encounter (Signed)
Outpatient Authorization [phone].Rockne Coons

## 2014-11-21 NOTE — Telephone Encounter (Signed)
[facility] called and needs a silverback for the patients visit on 12/07/14. Please fax this to [phone]. Blima Rich

## 2014-11-22 ENCOUNTER — Telehealth: Payer: Self-pay | Admitting: Family Medicine

## 2014-11-22 NOTE — Telephone Encounter (Signed)
Spoke with patient and informed her that she has an appointment on 12/17/2014 for her dexa scan at 9am and 10:45am with Dr. Lonny Prude

## 2014-11-22 NOTE — Telephone Encounter (Signed)
What type of appt does pt have on the 21 besides dr Lonny Prude?

## 2014-11-27 ENCOUNTER — Other Ambulatory Visit: Payer: Medicare HMO

## 2014-12-03 ENCOUNTER — Ambulatory Visit: Payer: Commercial Managed Care - HMO | Admitting: Family Medicine

## 2014-12-07 ENCOUNTER — Encounter: Payer: Self-pay | Admitting: Podiatry

## 2014-12-07 ENCOUNTER — Ambulatory Visit (INDEPENDENT_AMBULATORY_CARE_PROVIDER_SITE_OTHER): Payer: Commercial Managed Care - HMO | Admitting: Podiatry

## 2014-12-07 VITALS — BP 164/87 | HR 64 | Resp 12

## 2014-12-07 DIAGNOSIS — M79676 Pain in unspecified toe(s): Secondary | ICD-10-CM

## 2014-12-07 DIAGNOSIS — B351 Tinea unguium: Secondary | ICD-10-CM | POA: Diagnosis not present

## 2014-12-07 DIAGNOSIS — E1149 Type 2 diabetes mellitus with other diabetic neurological complication: Secondary | ICD-10-CM

## 2014-12-07 DIAGNOSIS — E1140 Type 2 diabetes mellitus with diabetic neuropathy, unspecified: Secondary | ICD-10-CM | POA: Diagnosis not present

## 2014-12-07 NOTE — Patient Instructions (Signed)
Diabetic Neuropathy Diabetic neuropathy is a nerve disease or nerve damage that is caused by diabetes mellitus. About half of all people with diabetes mellitus have some form of nerve damage. Nerve damage is more common in those who have had diabetes mellitus for many years and who generally have not had good control of their blood sugar (glucose) level. Diabetic neuropathy is a common complication of diabetes mellitus. There are three more common types of diabetic neuropathy and a fourth type that is less common and less understood:   Peripheral neuropathy--This is the most common type of diabetic neuropathy. It causes damage to the nerves of the feet and legs first and then eventually the hands and arms.The damage affects the ability to sense touch.  Autonomic neuropathy--This type causes damage to the autonomic nervous system, which controls the following functions:  Heartbeat.  Body temperature.  Blood pressure.  Urination.  Digestion.  Sweating.  Sexual function.  Focal neuropathy--Focal neuropathy can be painful and unpredictable and occurs most often in older adults with diabetes mellitus. It involves a specific nerve or one area and often comes on suddenly. It usually does not cause long-term problems.  Radiculoplexus neuropathy-- Sometimes called lumbosacral radiculoplexus neuropathy, radiculoplexus neuropathy affects the nerves of the thighs, hips, buttocks, or legs. It is more common in people with type 2 diabetes mellitus and in older men. It is characterized by debilitating pain, weakness, and atrophy, usually in the thigh muscles. CAUSES  The cause of peripheral, autonomic, and focal neuropathies is diabetes mellitus that is uncontrolled and high glucose levels. The cause of radiculoplexus neuropathy is unknown. However, it is thought to be caused by inflammation related to uncontrolled glucose levels. SIGNS AND SYMPTOMS  Peripheral Neuropathy Peripheral neuropathy develops  slowly over time. When the nerves of the feet and legs no longer work there may be:   Burning, stabbing, or aching pain in the legs or feet.  Inability to feel pressure or pain in your feet. This can lead to:  Thick calluses over pressure areas.  Pressure sores.  Ulcers.  Foot deformities.  Reduced ability to feel temperature changes.  Muscle weakness. Autonomic Neuropathy The symptoms of autonomic neuropathy vary depending on which nerves are affected. Symptoms may include:  Problems with digestion, such as:  Feeling sick to your stomach (nausea).  Vomiting.  Bloating.  Constipation.  Diarrhea.  Abdominal pain.  Difficulty with urination. This occurs if you lose your ability to sense when your bladder is full. Problems include:  Urine leakage (incontinence).  Inability to empty your bladder completely (retention).  Rapid or irregular heartbeat (palpitations).  Blood pressure drops when you stand up (orthostatic hypotension). When you stand up you may feel:  Dizzy.  Weak.  Faint.  In men, inability to attain and maintain an erection.  In women, vaginal dryness and problems with decreased sexual desire and arousal.  Problems with body temperature regulation.  Increased or decreased sweating. Focal Neuropathy  Abnormal eye movements or abnormal alignment of both eyes.  Weakness in the wrist.  Foot drop. This results in an inability to lift the foot properly and abnormal walking or foot movement.  Paralysis on one side of your face (Bell palsy).  Chest or abdominal pain. Radiculoplexus Neuropathy  Sudden, severe pain in your hip, thigh, or buttocks.  Weakness and wasting of thigh muscles.  Difficulty rising from a seated position.  Abdominal swelling.  Unexplained weight loss (usually more than 10 lb [4.5 kg]). DIAGNOSIS  Peripheral Neuropathy Your senses may   be tested. Sensory function testing can be done with:  A light touch using a  monofilament.  A vibration with tuning fork.  A sharp sensation with a pin prick. Other tests that can help diagnose neuropathy are:  Nerve conduction velocity. This test checks the transmission of an electrical current through a nerve.  Electromyography. This shows how muscles respond to electrical signals transmitted by nearby nerves.  Quantitative sensory testing. This is used to assess how your nerves respond to vibrations and changes in temperature. Autonomic Neuropathy Diagnosis is often based on reported symptoms. Tell your health care provider if you experience:   Dizziness.   Constipation.   Diarrhea.   Inappropriate urination or inability to urinate.   Inability to get or maintain an erection.  Tests that may be done include:   Electrocardiography or Holter monitor. These are tests that can help show problems with the heart rate or heart rhythm.   An X-ray exam may be done. Focal Neuropathy Diagnosis is made based on your symptoms and what your health care provider finds during your exam. Other tests may be done. They may include:  Nerve conduction velocities. This checks the transmission of electrical current through a nerve.  Electromyography. This shows how muscles respond to electrical signals transmitted by nearby nerves.  Quantitative sensory testing. This test is used to assess how your nerves respond to vibration and changes in temperature. Radiculoplexus Neuropathy  Often the first thing is to eliminate any other issue or problems that might be the cause, as there is no stick test for diagnosis.  X-ray exam of your spine and lumbar region.  Spinal tap to rule out cancer.  MRI to rule out other lesions. TREATMENT  Once nerve damage occurs, it cannot be reversed. The goal of treatment is to keep the disease or nerve damage from getting worse and affecting more nerve fibers. Controlling your blood glucose level is the key. Most people with  radiculoplexus neuropathy see at least a partial improvement over time. You will need to keep your blood glucose and HbA1c levels in the target range determined by your health care provider. Things that help control blood glucose levels include:   Blood glucose monitoring.   Meal planning.   Physical activity.   Diabetes medicine.  Over time, maintaining lower blood glucose levels helps lessen symptoms. Sometimes, prescription pain medicine is needed. HOME CARE INSTRUCTIONS:  Do not smoke.  Keep your blood glucose level in the range that you and your health care provider have determined acceptable for you.  Keep your blood pressure level in the range that you and your health care provider have determined acceptable for you.  Eat a well-balanced diet.  Be active every day.  Check your feet every day. SEEK MEDICAL CARE IF:   You have burning, stabbing, or aching pain in the legs or feet.  You are unable to feel pressure or pain in your feet.  You develop problems with digestion such as:  Nausea.  Vomiting.  Bloating.  Constipation.  Diarrhea.  Abdominal pain.  You have difficulty with urination, such as:  Incontinence.  Retention.  You have palpitations.  You develop orthostatic hypotension. When you stand up you may feel:  Dizzy.  Weak.  Faint.  You cannot attain and maintain an erection (in men).  You have vaginal dryness and problems with decreased sexual desire and arousal (in women).  You have severe pain in your thighs, legs, or buttocks.  You have unexplained weight loss.   Document Released: 11/23/2001 Document Revised: 07/05/2013 Document Reviewed: 02/23/2013 [facility] Patient Information 2015 [place], [place]. This information is not intended to replace advice given to you by your health care provider. Make sure you discuss any questions you have with your health care provider. Diabetes and Foot Care Diabetes may cause you to have problems  because of poor blood supply (circulation) to your feet and legs. This may cause the skin on your feet to become thinner, break easier, and heal more slowly. Your skin may become dry, and the skin may peel and crack. You may also have nerve damage in your legs and feet causing decreased feeling in them. You may not notice minor injuries to your feet that could lead to infections or more serious problems. Taking care of your feet is one of the most important things you can do for yourself.  HOME CARE INSTRUCTIONS  Wear shoes at all times, even in the house. Do not go barefoot. Bare feet are easily injured.  Check your feet daily for blisters, cuts, and redness. If you cannot see the bottom of your feet, use a mirror or ask someone for help.  Wash your feet with warm water (do not use hot water) and mild soap. Then pat your feet and the areas between your toes until they are completely dry. Do not soak your feet as this can dry your skin.  Apply a moisturizing lotion or petroleum jelly (that does not contain alcohol and is unscented) to the skin on your feet and to dry, brittle toenails. Do not apply lotion between your toes.  Trim your toenails straight across. Do not dig under them or around the cuticle. File the edges of your nails with an emery board or nail file.  Do not cut corns or calluses or try to remove them with medicine.  Wear clean socks or stockings every day. Make sure they are not too tight. Do not wear knee-high stockings since they may decrease blood flow to your legs.  Wear shoes that fit properly and have enough cushioning. To break in new shoes, wear them for just a few hours a day. This prevents you from injuring your feet. Always look in your shoes before you put them on to be sure there are no objects inside.  Do not cross your legs. This may decrease the blood flow to your feet.  If you find a minor scrape, cut, or break in the skin on your feet, keep it and the skin  around it clean and dry. These areas may be cleansed with mild soap and water. Do not cleanse the area with peroxide, alcohol, or iodine.  When you remove an adhesive bandage, be sure not to damage the skin around it.  If you have a wound, look at it several times a day to make sure it is healing.  Do not use heating pads or hot water bottles. They may burn your skin. If you have lost feeling in your feet or legs, you may not know it is happening until it is too late.  Make sure your health care provider performs a complete foot exam at least annually or more often if you have foot problems. Report any cuts, sores, or bruises to your health care provider immediately. SEEK MEDICAL CARE IF:   You have an injury that is not healing.  You have cuts or breaks in the skin.  You have an ingrown nail.  You notice redness on your legs or feet.  You feel burning or tingling in your legs or feet.  You have pain or cramps in your legs and feet.  Your legs or feet are numb.  Your feet always feel cold. SEEK IMMEDIATE MEDICAL CARE IF:   There is increasing redness, swelling, or pain in or around a wound.  There is a red line that goes up your leg.  Pus is coming from a wound.  You develop a fever or as directed by your health care provider.  You notice a bad smell coming from an ulcer or wound. Document Released: 09/11/2000 Document Revised: 05/17/2013 Document Reviewed: 02/21/2013 [facility] Patient Information 2015 [place], [place]. This information is not intended to replace advice given to you by your health care provider. Make sure you discuss any questions you have with your health care provider.

## 2014-12-07 NOTE — Progress Notes (Signed)
   Subjective:    Patient ID: Kristine Boyer, female    DOB: 06-04-1941, 74 y.o.   MRN: [id number]  HPI 74 year old female presents the office today for diabetic risk assessment and for painful, elongated, thickened toenails. Denies any redness or drainage on the nail sites. Her last HbA1c was 7.6 in December 2015. She denies any history of ulceration how she does have tingling and numbness for which she takes Lyrica for period she denies any claudication symptoms. No other complaints at this time.  Review of Systems  Eyes: Positive for visual disturbance.  Musculoskeletal: Positive for back pain and gait problem.  All other systems reviewed and are negative.      Objective:   Physical Exam AAO 3, NAD DP/PT pulses palpable, CRT less than 3 seconds Protective sensation decreased with Simms Weinstein monofilament, vibratory sensation decreased, Achilles tendon reflex intact. Nails are hypertrophic, dystrophic, elongated, brittle, discolored 10. There is subjective tenderness upon palpation of nails 1-5 bilaterally. There is no swelling erythema or drainage to the nail sites. There is a small hyperkeratotic lesion on dorsal lateral aspect of bilateral fifth digits. 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 No other open lesions or pre-ulcer lesions identified bilaterally. No interdigital maceration. No other areas of tenderness to bilateral lower extremities. No overlying edema, erythema, increase in warmth bilaterally. No pain with calf compression, swelling, warmth, erythema.      Assessment & Plan:  74 year old female with symptomatic onychomycosis. -Treatment options discussed including alternatives, risks, complications. -Nail sharply debrided 10 without complication/bleeding. -Discussed the importance of daily foot inspection. -Follow-up in 3 months or sooner if any problems are to arise. In the meantime encouraged to call the  office with any questions, concerns, changes symptoms.

## 2014-12-17 ENCOUNTER — Encounter: Payer: Self-pay | Admitting: Family Medicine

## 2014-12-17 ENCOUNTER — Other Ambulatory Visit: Payer: Medicare HMO

## 2014-12-17 ENCOUNTER — Ambulatory Visit (INDEPENDENT_AMBULATORY_CARE_PROVIDER_SITE_OTHER): Payer: Commercial Managed Care - HMO | Admitting: Family Medicine

## 2014-12-17 VITALS — BP 182/83 | Temp 97.7°F | Ht 63.0 in | Wt 178.3 lb

## 2014-12-17 DIAGNOSIS — R300 Dysuria: Secondary | ICD-10-CM | POA: Diagnosis not present

## 2014-12-17 DIAGNOSIS — E1165 Type 2 diabetes mellitus with hyperglycemia: Secondary | ICD-10-CM

## 2014-12-17 DIAGNOSIS — M25551 Pain in right hip: Secondary | ICD-10-CM | POA: Diagnosis not present

## 2014-12-17 DIAGNOSIS — M5441 Lumbago with sciatica, right side: Secondary | ICD-10-CM | POA: Diagnosis not present

## 2014-12-17 DIAGNOSIS — IMO0002 Reserved for concepts with insufficient information to code with codable children: Secondary | ICD-10-CM

## 2014-12-17 DIAGNOSIS — J392 Other diseases of pharynx: Secondary | ICD-10-CM

## 2014-12-17 DIAGNOSIS — R221 Localized swelling, mass and lump, neck: Secondary | ICD-10-CM | POA: Diagnosis not present

## 2014-12-17 DIAGNOSIS — K5900 Constipation, unspecified: Secondary | ICD-10-CM | POA: Diagnosis not present

## 2014-12-17 LAB — POCT URINALYSIS DIPSTICK
Bilirubin, UA: NEGATIVE
Glucose, UA: 500
Ketones, UA: NEGATIVE
NITRITE UA: NEGATIVE
PH UA: 7
PROTEIN UA: NEGATIVE
SPEC GRAV UA: 1.01
UROBILINOGEN UA: 0.2

## 2014-12-17 LAB — POCT GLYCOSYLATED HEMOGLOBIN (HGB A1C): HEMOGLOBIN A1C: 11.4

## 2014-12-17 LAB — POCT UA - MICROSCOPIC ONLY

## 2014-12-17 MED ORDER — POLYETHYLENE GLYCOL 3350 17 GM/SCOOP PO POWD: 17.0000 g | Freq: Every day | ORAL | Status: AC

## 2014-12-17 MED ORDER — FLUCONAZOLE 150 MG PO TABS: 150.0000 mg | ORAL_TABLET | Freq: Once | ORAL | Status: DC

## 2014-12-17 MED ORDER — METFORMIN HCL 500 MG PO TABS: 500.0000 mg | ORAL_TABLET | Freq: Two times a day (BID) | ORAL | Status: DC

## 2014-12-17 MED ORDER — CEPHALEXIN 500 MG PO CAPS: 500.0000 mg | ORAL_CAPSULE | Freq: Two times a day (BID) | ORAL | Status: DC

## 2014-12-17 NOTE — Patient Instructions (Addendum)
Thank you for coming to see me today. It was a pleasure. Today we talked about:   Hip pain: we decided you will try the Lyrica  Dysuria: I will prescribe an antibiotic for you.   Diabetes: We will start back on Lantus. Please take metformin 500mg  twice daily for the next two weeks, then increase to 1000mg  twice daily. Please check your blood sugar every morning.  Constipation: Please take Miralax every night. Our goal is to have one easy bowel movement every day.   Please make an appointment to see me in one month for follow-up.  If you have any questions or concerns, please do not hesitate to call the office at [phone].  Sincerely,  Cordelia Poche, MD

## 2014-12-17 NOTE — Assessment & Plan Note (Signed)
   Miralax 17g every night until regular, soft stools

## 2014-12-17 NOTE — Assessment & Plan Note (Signed)
Urinalysis does not overtly suggest infection  Keflex 500mg  BID x7 days  Urine culture  Diflucan 150mg  x1 after course of antibiotics

## 2014-12-17 NOTE — Assessment & Plan Note (Signed)
Hemoglobin A1C elevated up to 11.4 today. Patient reports very poor dietary compliance.   Metformin 500mg  BID with increase to 1000mg  BID in two weeks  Follow-up in 4 weeks  CBGs daily in AM

## 2014-12-17 NOTE — Assessment & Plan Note (Signed)
Appears related to sciatica. Lyrica has helped in the past except made her too sleepy. Decreased Lyrica dosage last visit but patient has not restarted  Restart Lyrica at lower dosage of 25mg  TID  Discussed keeping as active as possible

## 2014-12-17 NOTE — Progress Notes (Signed)
    Subjective    Kristine Boyer is a 74 y.o. female that presents for an office visit.   1. Hip pain: Symptoms have been going on for years. Pain is intermittent and achy and does not radiate. No weakness. Symptoms worse when walking for some time. No falls.  Muscle relaxant helps with pain. Lyrica helps as well but she has not used it recently.  2. Dysuria: Symptoms started a few months ago. Symptoms of burning occur after urinating. Increased frequency, urgency.   3. Diabetes mellitus: Has not been checking her blood sugar. Now has her meter but has not been using it. She has symptoms of urgency, frequency and polydipsia.   4. Constipation: Bowel movements occur every other day and are difficult, sometimes causing bloody stools. Last occurrence of hematochezia was two months ago. She had a recent colonoscopy which did not show malignancy.  History  Substance Use Topics  . Smoking status: Former Smoker    Quit date: 02/09/1986  . Smokeless tobacco: Never Used  . Alcohol Use: No    Allergies  Allergen Reactions  . Lactose Intolerance (Gi) Other (See Comments)    constipation  . Vicodin [Hydrocodone-Acetaminophen] Itching  . Neurontin [Gabapentin] Other (See Comments)    Drowsy. Makes her feel "funny"    No orders of the defined types were placed in this encounter.    ROS  Per HPI   Objective   BP 182/83 mmHg  Temp(Src) 97.7 F (36.5 C) (Oral)  Ht 5\' 3"  (1.6 m)  Wt 178 lb 4.8 oz (80.876 kg)  BMI 31.59 kg/m2  General: Well appearing, no distress Gastrointestinal: Soft, diffuse mild tenderness, no rebound, no distention Genitourinary: patient declined genitourinary exam    Musculoskeletal: FABER and FADIR negative. No pain on internal/external rotation of hips bilaterally Neuro: Alert  Assessment and Plan   Please refer to problem based charting of assessment and plan

## 2014-12-18 LAB — URINE CULTURE
COLONY COUNT: NO GROWTH
ORGANISM ID, BACTERIA: NO GROWTH

## 2014-12-19 ENCOUNTER — Telehealth: Payer: Self-pay | Admitting: *Deleted

## 2014-12-19 NOTE — Telephone Encounter (Signed)
-----   Message from Mariel Aloe, MD sent at 12/19/2014 12:04 PM EDT ----- Urine culture with no growth. Patient sent message through [facility]. Will have staff call as well to discontinue antibiotics.

## 2014-12-19 NOTE — Telephone Encounter (Signed)
Pt is aware and will discontinue medication.  She is c/o of "being raw down there."  She states that it is from wiping and being wet down there. Pt requested that she see a female provider.  Travonte Byard,CMA

## 2014-12-20 ENCOUNTER — Emergency Department (INDEPENDENT_AMBULATORY_CARE_PROVIDER_SITE_OTHER)
Admission: EM | Admit: 2014-12-20 | Discharge: 2014-12-20 | Disposition: A | Discharge status: Home / Self Care | Payer: Commercial Managed Care - HMO | Source: Home / Self Care | Attending: Family Medicine | Admitting: Family Medicine

## 2014-12-20 ENCOUNTER — Other Ambulatory Visit (HOSPITAL_COMMUNITY)
Admission: RE | Admit: 2014-12-20 | Discharge: 2014-12-20 | Disposition: A | Discharge status: Home / Self Care | Payer: Commercial Managed Care - HMO | Source: Ambulatory Visit | Attending: Family Medicine | Admitting: Family Medicine

## 2014-12-20 ENCOUNTER — Encounter (HOSPITAL_COMMUNITY): Payer: Self-pay | Admitting: Emergency Medicine

## 2014-12-20 ENCOUNTER — Ambulatory Visit: Payer: Commercial Managed Care - HMO | Admitting: Family Medicine

## 2014-12-20 DIAGNOSIS — N952 Postmenopausal atrophic vaginitis: Secondary | ICD-10-CM

## 2014-12-20 DIAGNOSIS — N898 Other specified noninflammatory disorders of vagina: Secondary | ICD-10-CM

## 2014-12-20 DIAGNOSIS — N760 Acute vaginitis: Secondary | ICD-10-CM | POA: Insufficient documentation

## 2014-12-20 DIAGNOSIS — B372 Candidiasis of skin and nail: Secondary | ICD-10-CM

## 2014-12-20 LAB — POCT URINALYSIS DIP (DEVICE)
BILIRUBIN URINE: NEGATIVE
GLUCOSE, UA: 500 mg/dL — AB
HGB URINE DIPSTICK: NEGATIVE
Ketones, ur: NEGATIVE mg/dL
LEUKOCYTES UA: NEGATIVE
NITRITE: NEGATIVE
Protein, ur: NEGATIVE mg/dL
Specific Gravity, Urine: 1.01 (ref 1.005–1.030)
Urobilinogen, UA: 0.2 mg/dL (ref 0.0–1.0)
pH: 6 (ref 5.0–8.0)

## 2014-12-20 MED ORDER — CLOTRIMAZOLE-BETAMETHASONE 1-0.05 % EX CREA: TOPICAL_CREAM | CUTANEOUS | Status: DC

## 2014-12-20 MED ORDER — FLUCONAZOLE 150 MG PO TABS: ORAL_TABLET | ORAL | Status: DC

## 2014-12-20 NOTE — Discharge Instructions (Signed)
Cutaneous Candidiasis Cutaneous candidiasis is a condition in which there is an overgrowth of yeast (candida) on the skin. Yeast normally live on the skin, but in small enough numbers not to cause any symptoms. In certain cases, increased growth of the yeast may cause an actual yeast infection. This kind of infection usually occurs in areas of the skin that are constantly warm and moist, such as the armpits or the groin. Yeast is the most common cause of diaper rash in babies and in people who cannot control their bowel movements (incontinence). CAUSES  The fungus that most often causes cutaneous candidiasis is Candida albicans. Conditions that can increase the risk of getting a yeast infection of the skin include:  Obesity.  Pregnancy.  Diabetes.  Taking antibiotic medicine.  Taking birth control pills.  Taking steroid medicines.  Thyroid disease.  An iron or zinc deficiency.  Problems with the immune system. SYMPTOMS   Red, swollen area of the skin.  Bumps on the skin.  Itchiness. DIAGNOSIS  The diagnosis of cutaneous candidiasis is usually based on its appearance. Light scrapings of the skin may also be taken and viewed under a microscope to identify the presence of yeast. TREATMENT  Antifungal creams may be applied to the infected skin. In severe cases, oral medicines may be needed.  HOME CARE INSTRUCTIONS   Keep your skin clean and dry.  Maintain a healthy weight.  If you have diabetes, keep your blood sugar under control. SEEK IMMEDIATE MEDICAL CARE IF:  Your rash continues to spread despite treatment.  You have a fever, chills, or abdominal pain. Document Released: 06/02/2011 Document Revised: 12/07/2011 Document Reviewed: 06/02/2011 [facility] Patient Information 2015 [place], [place]. This information is not intended to replace advice given to you by your health care provider. Make sure you discuss any questions you have with your health care  provider.  Vaginitis Vaginitis is an inflammation of the vagina. It is most often caused by a change in the normal balance of the bacteria and yeast that live in the vagina. This change in balance causes an overgrowth of certain bacteria or yeast, which causes the inflammation. There are different types of vaginitis, but the most common types are:  Bacterial vaginosis.  Yeast infection (candidiasis).  Trichomoniasis vaginitis. This is a sexually transmitted infection (STI).  Viral vaginitis.  Atropic vaginitis.  Allergic vaginitis. CAUSES  The cause depends on the type of vaginitis. Vaginitis can be caused by:  Bacteria (bacterial vaginosis).  Yeast (yeast infection).  A parasite (trichomoniasis vaginitis)  A virus (viral vaginitis).  Low hormone levels (atrophic vaginitis). Low hormone levels can occur during pregnancy, breastfeeding, or after menopause.  Irritants, such as bubble baths, scented tampons, and feminine sprays (allergic vaginitis). Other factors can change the normal balance of the yeast and bacteria that live in the vagina. These include:  Antibiotic medicines.  Poor hygiene.  Diaphragms, vaginal sponges, spermicides, birth control pills, and intrauterine devices (IUD).  Sexual intercourse.  Infection.  Uncontrolled diabetes.  A weakened immune system. SYMPTOMS  Symptoms can vary depending on the cause of the vaginitis. Common symptoms include:  Abnormal vaginal discharge.  The discharge is white, gray, or yellow with bacterial vaginosis.  The discharge is thick, white, and cheesy with a yeast infection.  The discharge is frothy and yellow or greenish with trichomoniasis.  A bad vaginal odor.  The odor is fishy with bacterial vaginosis.  Vaginal itching, pain, or swelling.  Painful intercourse.  Pain or burning when urinating. Sometimes, there  are no symptoms. TREATMENT  Treatment will vary depending on the type of infection.    Bacterial vaginosis and trichomoniasis are often treated with antibiotic creams or pills.  Yeast infections are often treated with antifungal medicines, such as vaginal creams or suppositories.  Viral vaginitis has no cure, but symptoms can be treated with medicines that relieve discomfort. Your sexual partner should be treated as well.  Atrophic vaginitis may be treated with an estrogen cream, pill, suppository, or vaginal ring. If vaginal dryness occurs, lubricants and moisturizing creams may help. You may be told to avoid scented soaps, sprays, or douches.  Allergic vaginitis treatment involves quitting the use of the product that is causing the problem. Vaginal creams can be used to treat the symptoms. HOME CARE INSTRUCTIONS   Take all medicines as directed by your caregiver.  Keep your genital area clean and dry. Avoid soap and only rinse the area with water.  Avoid douching. It can remove the healthy bacteria in the vagina.  Do not use tampons or have sexual intercourse until your vaginitis has been treated. Use sanitary pads while you have vaginitis.  Wipe from front to back. This avoids the spread of bacteria from the rectum to the vagina.  Let air reach your genital area.  Wear cotton underwear to decrease moisture buildup.  Avoid wearing underwear while you sleep until your vaginitis is gone.  Avoid tight pants and underwear or nylons without a cotton panel.  Take off wet clothing (especially bathing suits) as soon as possible.  Use mild, non-scented products. Avoid using irritants, such as:  Scented feminine sprays.  Fabric softeners.  Scented detergents.  Scented tampons.  Scented soaps or bubble baths.  Practice safe sex and use condoms. Condoms may prevent the spread of trichomoniasis and viral vaginitis. SEEK MEDICAL CARE IF:   You have abdominal pain.  You have a fever or persistent symptoms for more than 2-3 days.  You have a fever and your  symptoms suddenly get worse. Document Released: 07/12/2007 Document Revised: 06/08/2012 Document Reviewed: 02/25/2012 [facility] Patient Information 2015 [place], [place]. This information is not intended to replace advice given to you by your health care provider. Make sure you discuss any questions you have with your health care provider.

## 2014-12-20 NOTE — ED Notes (Addendum)
Reports vaginal itching and hemorrhoids itching. Burning with urination, reports going more often than usual .  Drinks a lot of koolaid/lemon/citrus flavor

## 2014-12-20 NOTE — ED Provider Notes (Addendum)
CSN: [id number]     Arrival date & time 12/20/14  1520 History   First MD Initiated Contact with Patient 12/20/14 1643     Chief Complaint  Patient presents with  . Vaginal Itching   (Consider location/radiation/quality/duration/timing/severity/associated sxs/prior Treatment) HPI Comments: 74 year old female with history of type 2 diabetes mellitus and nonspecific immune deficiency disorder presents with a complaint of itching in the groin for 2 weeks. She is also complaining of an abnormal vaginal discharge.  Patient is a 74 y.o. female presenting with vaginal itching.  Vaginal Itching    Past Medical History  Diagnosis Date  . Diabetes mellitus   . Immune deficiency disorder   . Hypertension   . Hypothyroidism   . Bipolar affective   . Alzheimer disease   . Allergy   . Anemia   . Blood transfusion without reported diagnosis   . Cataract   . GERD (gastroesophageal reflux disease)   . Seizures   . Hyperlipidemia   . Internal hemorrhoids   . Adenomatous colon polyp    Past Surgical History  Procedure Laterality Date  . Abdominal hysterectomy    . Tonsilectomy, adenoidectomy, bilateral myringotomy and tubes     Family History  Problem Relation Age of Onset  . Diabetes Mother   . Colon cancer      grandmother  . Stomach cancer Sister    History  Substance Use Topics  . Smoking status: Former Smoker    Quit date: 02/09/1986  . Smokeless tobacco: Never Used  . Alcohol Use: No   OB History    No data available     Review of Systems  Constitutional: Negative.   Genitourinary: Positive for vaginal discharge. Negative for dysuria, frequency, hematuria, flank pain and pelvic pain.  Skin: Positive for color change and rash.  All other systems reviewed and are negative.   Allergies  Lactose intolerance (gi); Vicodin; and Neurontin  Home Medications   Prior to Admission medications   Medication Sig Start Date End Date Taking? Authorizing Provider  aspirin EC  81 MG tablet Take 81 mg by mouth daily. 10/14/11 11/01/13  Waldemar Dickens, MD  atorvastatin (LIPITOR) 40 MG tablet Take 1 tablet (40 mg total) by mouth daily. 10/04/13   Waldemar Dickens, MD  baclofen (LIORESAL) 10 MG tablet Take 1 tablet (10 mg total) by mouth 3 (three) times daily as needed for muscle spasms. 12/27/13   Waldemar Dickens, MD  Blood Glucose Monitoring Suppl (ONE TOUCH ULTRA SYSTEM KIT) W/DEVICE KIT 1 kit by Does not apply route once. 09/26/14   Mariel Aloe, MD  calcium carbonate (OS-CAL) 600 MG TABS Take 600 mg by mouth 2 (two) times daily with a meal. 10/14/11   Waldemar Dickens, MD  cephALEXin (KEFLEX) 500 MG capsule Take 1 capsule (500 mg total) by mouth 2 (two) times daily. 12/17/14   Mariel Aloe, MD  cetirizine (ZYRTEC) 10 MG tablet Take 1 tablet (10 mg total) by mouth daily. 05/14/14   Mariel Aloe, MD  clotrimazole-betamethasone (LOTRISONE) cream Apply to affected area 2 times daily prn 12/20/14   Janne Napoleon, NP  fluconazole (DIFLUCAN) 150 MG tablet 1 tab po x 1. May repeat in 72 hours if no improvement 12/20/14   Janne Napoleon, NP  levothyroxine (SYNTHROID, LEVOTHROID) 175 MCG tablet Take 1 tablet (175 mcg total) by mouth daily. 10/04/13   Waldemar Dickens, MD  meloxicam (MOBIC) 7.5 MG tablet Take 1-2 tablets (7.5-15 mg total) by mouth  daily. 07/28/13   Waldemar Dickens, MD  metFORMIN (GLUCOPHAGE) 500 MG tablet Take 1 tablet (500 mg total) by mouth 2 (two) times daily with a meal. 12/17/14   Mariel Aloe, MD  metoprolol (TOPROL-XL) 200 MG 24 hr tablet Take 1 tablet (200 mg total) by mouth daily. 02/12/14   Waldemar Dickens, MD  oxybutynin (DITROPAN) 5 MG tablet Take 1 tablet (5 mg total) by mouth 3 (three) times daily. For overactive bladder 03/21/14   Waldemar Dickens, MD  pantoprazole (PROTONIX) 40 MG tablet Take 1 tablet (40 mg total) by mouth daily. 06/19/14   Mariel Aloe, MD  phenazopyridine (PYRIDIUM) 100 MG tablet Take 1 tablet (100 mg total) by mouth 3 (three) times daily as needed  for pain. 03/21/14   Waldemar Dickens, MD  polyethylene glycol powder (GLYCOLAX/MIRALAX) powder Take 17 g by mouth daily. 12/17/14   Mariel Aloe, MD  potassium chloride SA (K-DUR,KLOR-CON) 20 MEQ tablet Take 1 tablet (20 mEq total) by mouth daily. 10/20/13   Waldemar Dickens, MD  pregabalin (LYRICA) 25 MG capsule Take 1 capsule (25 mg total) by mouth 3 (three) times daily. 11/15/14   Mariel Aloe, MD  senna (SENOKOT) 8.6 MG tablet Take 2 tablets (17.2 mg total) by mouth 2 (two) times daily. Once achieve daily BM decrease to one tablet daily 10/24/13   Waldemar Dickens, MD  sodium phosphate (FLEET) enema Place 1 enema rectally once. follow package directions 10/24/13   Waldemar Dickens, MD  triamcinolone (NASACORT AQ) 55 MCG/ACT AERO nasal inhaler Place 2 sprays into the nose daily. 05/14/14   Mariel Aloe, MD  valsartan (DIOVAN) 320 MG tablet Take 1 tablet (320 mg total) by mouth daily. 05/14/14   Mariel Aloe, MD   There were no vitals taken for this visit. Physical Exam  Constitutional: She is oriented to person, place, and time. She appears well-developed and well-nourished. No distress.  Neck: Normal range of motion.  Cardiovascular: Normal rate and normal heart sounds.   Pulmonary/Chest: Effort normal and breath sounds normal. No respiratory distress.  Genitourinary:  Normal external female genitalia for age. The skin folds of the inguina, medial thighs and towards the anus have a well marginated lighter brown to slightly red discoloration in appearance consistent with fungal intertrigo. Intravaginal exam reveals no cervix. Atrophic vaginal walls. Small amount of thin gray discharge.  Neurological: She is alert and oriented to person, place, and time.  Skin: Skin is warm and dry.  Psychiatric: She has a normal mood and affect.  Nursing note and vitals reviewed.   ED Course  Procedures (including critical care time) Labs Review Labs Reviewed  POCT URINALYSIS DIP (DEVICE) - Abnormal;  Notable for the following:    Glucose, UA 500 (*)    All other components within normal limits  CERVICOVAGINAL ANCILLARY ONLY   Results for orders placed or performed during the hospital encounter of 12/20/14  POCT urinalysis dip (device)  Result Value Ref Range   Glucose, UA 500 (A) NEGATIVE mg/dL   Bilirubin Urine NEGATIVE NEGATIVE   Ketones, ur NEGATIVE NEGATIVE mg/dL   Specific Gravity, Urine 1.010 1.005 - 1.030   Hgb urine dipstick NEGATIVE NEGATIVE   pH 6.0 5.0 - 8.0   Protein, ur NEGATIVE NEGATIVE mg/dL   Urobilinogen, UA 0.2 0.0 - 1.0 mg/dL   Nitrite NEGATIVE NEGATIVE   Leukocytes, UA NEGATIVE NEGATIVE    Imaging Review No results found.   MDM  1. Vaginal discharge   2. Candidal intertrigo   3. Postmenopausal atrophic vaginitis    Diflucan 150 mg one by mouth now may repeat in 3 days as needed Lotrisone cream to affected areas twice a day. Keep areas dry. Affirm cytology pending Flagyl bid   Janne Napoleon, NP 12/20/14 1713  Janne Napoleon, NP 12/25/14 2149

## 2014-12-21 LAB — CERVICOVAGINAL ANCILLARY ONLY
WET PREP (BD AFFIRM): NEGATIVE
Wet Prep (BD Affirm): NEGATIVE
Wet Prep (BD Affirm): POSITIVE — AB

## 2014-12-24 MED ORDER — METRONIDAZOLE 500 MG PO TABS: 500.0000 mg | ORAL_TABLET | Freq: Two times a day (BID) | ORAL | Status: DC

## 2014-12-25 ENCOUNTER — Telehealth (HOSPITAL_COMMUNITY): Payer: Self-pay | Admitting: *Deleted

## 2014-12-25 NOTE — ED Notes (Addendum)
Candida and Trich neg., Gardnerella pos.  3/28 Message sent to Janne Napoleon NP 3/29  He e-prescribed Flagyl to pt.'s pharmacy.  I called pt. and left a message to call. Call 1. Roselyn Meier 12/25/2014 Pt. called back.  Pt. verified x 2 and given results.  Pt. told she needs Flagyl for bacterial vaginosis.   Pt. instructed to no alcohol while taking this medication.  She said she can't get it until Friday.   Roselyn Meier 12/25/2014

## 2014-12-31 ENCOUNTER — Ambulatory Visit
Admission: RE | Admit: 2014-12-31 | Discharge: 2014-12-31 | Disposition: A | Discharge status: Home / Self Care | Payer: Commercial Managed Care - HMO | Source: Ambulatory Visit | Attending: Family Medicine | Admitting: Family Medicine

## 2014-12-31 ENCOUNTER — Encounter: Payer: Self-pay | Admitting: Family Medicine

## 2014-12-31 DIAGNOSIS — Z780 Asymptomatic menopausal state: Secondary | ICD-10-CM | POA: Diagnosis not present

## 2014-12-31 DIAGNOSIS — Z13820 Encounter for screening for osteoporosis: Secondary | ICD-10-CM | POA: Diagnosis not present

## 2014-12-31 DIAGNOSIS — E2839 Other primary ovarian failure: Secondary | ICD-10-CM

## 2014-12-31 DIAGNOSIS — Z90710 Acquired absence of both cervix and uterus: Secondary | ICD-10-CM | POA: Diagnosis not present

## 2015-01-15 ENCOUNTER — Ambulatory Visit (INDEPENDENT_AMBULATORY_CARE_PROVIDER_SITE_OTHER): Payer: Commercial Managed Care - HMO | Admitting: Family Medicine

## 2015-01-15 ENCOUNTER — Encounter: Payer: Self-pay | Admitting: Family Medicine

## 2015-01-15 VITALS — BP 136/83 | HR 85 | Temp 98.3°F | Ht 63.0 in | Wt 174.0 lb

## 2015-01-15 DIAGNOSIS — R51 Headache: Secondary | ICD-10-CM | POA: Diagnosis not present

## 2015-01-15 DIAGNOSIS — E1165 Type 2 diabetes mellitus with hyperglycemia: Secondary | ICD-10-CM | POA: Diagnosis not present

## 2015-01-15 DIAGNOSIS — IMO0002 Reserved for concepts with insufficient information to code with codable children: Secondary | ICD-10-CM

## 2015-01-15 DIAGNOSIS — K5900 Constipation, unspecified: Secondary | ICD-10-CM | POA: Diagnosis not present

## 2015-01-15 DIAGNOSIS — R519 Headache, unspecified: Secondary | ICD-10-CM

## 2015-01-15 MED ORDER — GLUCOSE BLOOD VI STRP: ORAL_STRIP | Status: DC

## 2015-01-15 NOTE — Progress Notes (Signed)
    Subjective    Kristine Boyer is a 74 y.o. female that presents for an office visit.   1. Diabetes mellitus: Patient is not adhere with metformin. Patient does not adhere to a proper diet. States she does not always remember to take her medications.  2. Scalp pain: Patient states she had a fall about 1-2 years ago and thinks this pain might be related. She has intermittent pain on her scalp that is random. Pain stays located at top of scalp with no radiation. Pain improves on its own. No vision disturbances. No nausea or vomiting.   3. Constipatation: has been taking Miralax intermittently. She has been using soap/water and a fleet enema less than once per week.   History  Substance Use Topics  . Smoking status: Former Smoker    Quit date: 02/09/1986  . Smokeless tobacco: Never Used  . Alcohol Use: No    Allergies  Allergen Reactions  . Lactose Intolerance (Gi) Other (See Comments)    constipation  . Vicodin [Hydrocodone-Acetaminophen] Itching  . Neurontin [Gabapentin] Other (See Comments)    Drowsy. Makes her feel "funny"    No orders of the defined types were placed in this encounter.    ROS Per HPI  Objective   BP 136/83 mmHg  Pulse 85  Temp(Src) 98.3 F (36.8 C) (Oral)  Ht 5\' 3"  (1.6 m)  Wt 174 lb (78.926 kg)  BMI 30.83 kg/m2  General: Well appearing, no distress. Daughter with her. HEENT: No obvious lesions on scalp. Area of scalp with thinned hair and absence of hair follicles. No rash or folliculitis. No dry skin noted  Assessment and Plan   Please refer to problem based charting of assessment and plan

## 2015-01-15 NOTE — Patient Instructions (Signed)
Thank you for coming to see me today. It was a pleasure. Today we talked about:   Diabetes: Please make sure to take your Metformin. I want you to take 1000mg  twice daily. Also, I have sent test strips to your pharmacy. Please make an appointment with [place] so you can be helped with setting it up (or ask your pharmacist to help you).  Head pain: I could not see anything worrisome. We will keep an eye out for more symptoms.  Constipation: I want you to continue taking Miralax. You can take it every other day, however, if you are not having regular bowel movements, I would like you to take it every day.  Please make an appointment to see me in 4 weeks for follow-up. Please schedule an appointment for [facility]iatric [facility].  If you have any questions or concerns, please do not hesitate to call the office at [phone].  Sincerely,  Cordelia Poche, MD

## 2015-01-16 DIAGNOSIS — R51 Headache: Secondary | ICD-10-CM

## 2015-01-16 DIAGNOSIS — R519 Headache, unspecified: Secondary | ICD-10-CM | POA: Insufficient documentation

## 2015-01-16 HISTORY — DX: Headache, unspecified: R51.9

## 2015-01-16 NOTE — Assessment & Plan Note (Signed)
Patient not adherent with Miralax.  Reinforced daily usage of Miralax

## 2015-01-16 NOTE — Assessment & Plan Note (Signed)
Patient not adherent with medication. She has also been eating heavy carb foods including candies and ice cream  Discussed decreasing intake of high carb foods  Reinforced proper adherence with medication regimen  Hesitant to restart patient on insulin since she is not very reliable with her current medication regimen  Will need to start thinning medication list as patient seems to be having difficulties with managing prescriptions  Follow-up in geriatric clinic in addition to follow-up with me in 4 weeks

## 2015-01-16 NOTE — Assessment & Plan Note (Signed)
No lesions noted. Intermittent. Scalp appears well moistened.  Patient advised to watch for recurrent symptoms

## 2015-01-18 ENCOUNTER — Telehealth: Payer: Self-pay | Admitting: Family Medicine

## 2015-01-18 DIAGNOSIS — Z1239 Encounter for other screening for malignant neoplasm of breast: Secondary | ICD-10-CM

## 2015-01-18 NOTE — Telephone Encounter (Signed)
Patient's insurance contacted her and ask that she get provider to set her up an appt for a screening mammogram.

## 2015-01-22 ENCOUNTER — Telehealth: Payer: Self-pay | Admitting: Family Medicine

## 2015-01-22 NOTE — Telephone Encounter (Signed)
Needs a referral sent to Dr Katy Fitch Has appt 02-01-15

## 2015-01-22 NOTE — Telephone Encounter (Signed)
Approval number [id number]

## 2015-01-23 ENCOUNTER — Telehealth: Payer: Self-pay | Admitting: Family Medicine

## 2015-01-23 NOTE — Telephone Encounter (Signed)
Needs an appointment at the breast center for a mammogram before June 23rd. Please inform patient when complete. Thank you Fonda Kinder, ASA

## 2015-01-23 NOTE — Telephone Encounter (Signed)
LVM for pt to return call to inform her that an order has been placed for her screening mammogram and that she can call and set up the appt at her earliest convenience. [place]  Katharina Caper, April D

## 2015-01-24 ENCOUNTER — Telehealth: Payer: Self-pay | Admitting: Family Medicine

## 2015-01-24 NOTE — Telephone Encounter (Signed)
Wants a list of her appts that she has pending

## 2015-01-24 NOTE — Telephone Encounter (Signed)
Spoke with patient and she was inquiring abut past referral appointments. I gave her the number to [facility] to follow up on the referral sent that(is has been approved by [facility] already). Informed her that order for screening mammogram has been put in and she can schedule at her convenience.

## 2015-01-24 NOTE — Telephone Encounter (Signed)
Spoke with patient and she was inquiring abut past referral appointments. I gave her the number to [facility] to follow up on the referral sent that(is has been approved by [facility] already). Informed her that order for screening mammogram has been put in and she can schedule at her convenience.

## 2015-01-29 ENCOUNTER — Ambulatory Visit: Payer: Commercial Managed Care - HMO

## 2015-01-30 DIAGNOSIS — M47812 Spondylosis without myelopathy or radiculopathy, cervical region: Secondary | ICD-10-CM | POA: Diagnosis not present

## 2015-01-30 DIAGNOSIS — K219 Gastro-esophageal reflux disease without esophagitis: Secondary | ICD-10-CM | POA: Diagnosis not present

## 2015-02-01 DIAGNOSIS — Z961 Presence of intraocular lens: Secondary | ICD-10-CM | POA: Diagnosis not present

## 2015-02-01 DIAGNOSIS — E119 Type 2 diabetes mellitus without complications: Secondary | ICD-10-CM | POA: Diagnosis not present

## 2015-02-01 LAB — HM DIABETES EYE EXAM

## 2015-02-04 ENCOUNTER — Other Ambulatory Visit: Payer: Self-pay | Admitting: *Deleted

## 2015-02-04 DIAGNOSIS — I10 Essential (primary) hypertension: Secondary | ICD-10-CM

## 2015-02-05 MED ORDER — METOPROLOL SUCCINATE ER 200 MG PO TB24: 200.0000 mg | ORAL_TABLET | Freq: Every day | ORAL | Status: DC

## 2015-02-07 ENCOUNTER — Other Ambulatory Visit: Payer: Self-pay | Admitting: *Deleted

## 2015-02-26 ENCOUNTER — Telehealth: Payer: Self-pay | Admitting: Family Medicine

## 2015-02-26 MED ORDER — ONETOUCH ULTRA SYSTEM W/DEVICE KIT: 1.0000 | PACK | Freq: Once | Status: DC

## 2015-02-26 NOTE — Telephone Encounter (Signed)
Pharmacy called to say that Kristine Boyer' ins will not pay for the One Touch meter, but will for the Accuchek.  Need new rx sent to pharmacy for lancets and strips also.

## 2015-02-26 NOTE — Telephone Encounter (Signed)
Will route request to Dr. Lonny Prude.  Burna Forts, BSN, RN-BC

## 2015-02-28 ENCOUNTER — Ambulatory Visit: Payer: Commercial Managed Care - HMO

## 2015-03-08 ENCOUNTER — Encounter: Payer: Self-pay | Admitting: Podiatry

## 2015-03-08 ENCOUNTER — Ambulatory Visit (INDEPENDENT_AMBULATORY_CARE_PROVIDER_SITE_OTHER): Payer: Medicare HMO | Admitting: Podiatry

## 2015-03-08 DIAGNOSIS — E1140 Type 2 diabetes mellitus with diabetic neuropathy, unspecified: Secondary | ICD-10-CM

## 2015-03-08 DIAGNOSIS — M79676 Pain in unspecified toe(s): Secondary | ICD-10-CM | POA: Diagnosis not present

## 2015-03-08 DIAGNOSIS — B351 Tinea unguium: Secondary | ICD-10-CM

## 2015-03-08 DIAGNOSIS — Q828 Other specified congenital malformations of skin: Secondary | ICD-10-CM

## 2015-03-08 DIAGNOSIS — E1149 Type 2 diabetes mellitus with other diabetic neurological complication: Secondary | ICD-10-CM

## 2015-03-12 ENCOUNTER — Ambulatory Visit
Admission: RE | Admit: 2015-03-12 | Discharge: 2015-03-12 | Disposition: A | Discharge status: Home / Self Care | Payer: Commercial Managed Care - HMO | Source: Ambulatory Visit | Attending: Family Medicine | Admitting: Family Medicine

## 2015-03-12 DIAGNOSIS — Z1231 Encounter for screening mammogram for malignant neoplasm of breast: Secondary | ICD-10-CM | POA: Diagnosis not present

## 2015-03-12 DIAGNOSIS — Z1239 Encounter for other screening for malignant neoplasm of breast: Secondary | ICD-10-CM

## 2015-03-12 NOTE — Progress Notes (Signed)
Patient ID: Kristine Boyer, female   DOB: 06/06/1941, 74 y.o.   MRN: [id number]  Subjective: 74 y.o.-year-old female returns the office today for painful, elongated, thickened toenails which she is unable to trim herself. Denies any redness or drainage around the nails. Denies any acute changes since last appointment and no new complaints today. Denies any systemic complaints such as fevers, chills, nausea, vomiting.   Objective: AAO 3, NAD DP/PT pulses palpable, CRT less than 3 seconds Protective sensation decreased with Simms Weinstein monofilament, Achilles tendon reflex intact.  Nails hypertrophic, dystrophic, elongated, brittle, discolored 10. There is tenderness overlying the nails 1-5 bilaterally. There is no surrounding erythema or drainage along the nail sites. Small hyperkeratotic lesions bilateral dorsal lateral fifth digit with mild adductovarus deformity of the digit. Hammertoe contractures identified. No open lesions or pre-ulcerative lesions are identified. No other areas of tenderness bilateral lower extremities. No overlying edema, erythema, increased warmth. No pain with calf compression, swelling, warmth, erythema.  Assessment: Patient presents with symptomatic onychomycosis  Plan: -Treatment options including alternatives, risks, complications were discussed -Nails sharply debrided 10 without complication/bleeding. -Hyperkeratotic lesions debrided 2 without complication/bleeding. -Discussed daily foot inspection. If there are any changes, to call the office immediately.  -Follow-up in 3 months or sooner if any problems are to arise. In the meantime, encouraged to call the office with any questions, concerns, changes symptoms.

## 2015-03-18 DIAGNOSIS — M5406 Panniculitis affecting regions of neck and back, lumbar region: Secondary | ICD-10-CM | POA: Diagnosis not present

## 2015-03-18 DIAGNOSIS — M2550 Pain in unspecified joint: Secondary | ICD-10-CM | POA: Diagnosis not present

## 2015-03-18 DIAGNOSIS — M25569 Pain in unspecified knee: Secondary | ICD-10-CM | POA: Diagnosis not present

## 2015-03-19 ENCOUNTER — Encounter: Payer: Self-pay | Admitting: Family Medicine

## 2015-03-19 ENCOUNTER — Ambulatory Visit (INDEPENDENT_AMBULATORY_CARE_PROVIDER_SITE_OTHER): Payer: Commercial Managed Care - HMO | Admitting: Family Medicine

## 2015-03-19 VITALS — BP 163/77 | HR 67 | Temp 98.0°F | Ht 63.0 in | Wt 178.0 lb

## 2015-03-19 DIAGNOSIS — R300 Dysuria: Secondary | ICD-10-CM | POA: Diagnosis not present

## 2015-03-19 DIAGNOSIS — R1030 Lower abdominal pain, unspecified: Secondary | ICD-10-CM

## 2015-03-19 DIAGNOSIS — IMO0002 Reserved for concepts with insufficient information to code with codable children: Secondary | ICD-10-CM

## 2015-03-19 DIAGNOSIS — E1165 Type 2 diabetes mellitus with hyperglycemia: Secondary | ICD-10-CM | POA: Diagnosis not present

## 2015-03-19 DIAGNOSIS — I10 Essential (primary) hypertension: Secondary | ICD-10-CM | POA: Diagnosis not present

## 2015-03-19 LAB — POCT URINALYSIS DIPSTICK
BILIRUBIN UA: NEGATIVE
GLUCOSE UA: NEGATIVE
Ketones, UA: NEGATIVE
Nitrite, UA: NEGATIVE
Protein, UA: 30
RBC UA: NEGATIVE
Spec Grav, UA: 1.02
UROBILINOGEN UA: 0.2
pH, UA: 5.5

## 2015-03-19 LAB — POCT GLYCOSYLATED HEMOGLOBIN (HGB A1C): Hemoglobin A1C: 8.8

## 2015-03-19 LAB — POCT UA - MICROSCOPIC ONLY

## 2015-03-19 MED ORDER — GLIPIZIDE 5 MG PO TABS: 5.0000 mg | ORAL_TABLET | Freq: Every day | ORAL | Status: DC

## 2015-03-19 NOTE — Patient Instructions (Addendum)
Thank you for coming to see me today. It was a pleasure. Today we talked about:   Diabetes: Your A1C is better today. Since metformin is causing you problems, I will discontinue that medication and start you on glipizide 5mg  daily  Abdominal pain: this is probably related to your constipation and maybe metformin  Dysuria: Your urine looked clean. It does not appear that you have a urinary tract infection]  Hypertension: no changes to medications  Please make an appointment to see me in 4 weeks and Geriatric clinic  If you have any questions or concerns, please do not hesitate to call the office at [phone].  Sincerely,  Cordelia Poche, MD

## 2015-03-19 NOTE — Progress Notes (Signed)
    Subjective    Kristine Boyer is a 74 y.o. female that presents for a follow-up visit for chronic issues.   1. Lower abdominal pain: Patient feels like she had a tear inside her abdomen. Pain is a stabbing pain. Pain started months ago and is unsure about when it worsened. Pain worse when straining for a bowel movement. She is having soft stools occuring about every 2-3 days. She has been taking an "arthritis medication" which has helped with her pain. She has associated burning flank pain. States she   2. Diabetes mellitus, type 2: She is not adherent with her metformin regimen as she states she sometimes has diarrhea. She is modifying her carbohydrate intake. No hypoglycemia.   3. Hypertension: She states she has some atypical, non-exertional sharp chest pain that resolved when she pressed on her chest. She has infrequent headaches. No shortness of breath.  History  Substance Use Topics  . Smoking status: Former Smoker    Quit date: 02/09/1986  . Smokeless tobacco: Never Used  . Alcohol Use: No    Allergies  Allergen Reactions  . Lactose Intolerance (Gi) Other (See Comments)    constipation  . Vicodin [Hydrocodone-Acetaminophen] Itching  . Neurontin [Gabapentin] Other (See Comments)    Drowsy. Makes her feel "funny"    Meds ordered this encounter  Medications  . glipiZIDE (GLUCOTROL) 5 MG tablet    Sig: Take 1 tablet (5 mg total) by mouth daily before breakfast.    Dispense:  60 tablet    Refill:  3    ROS  Per HPI   Objective   BP 163/77 mmHg  Pulse 67  Temp(Src) 98 F (36.7 C) (Oral)  Ht 5\' 3"  (1.6 m)  Wt 178 lb (80.74 kg)  BMI 31.54 kg/m2  General: Well appearing, no distress Gastrointestinal: RLQ, LLQ and suprapubic tenderness. No distension. Normal bowel sounds.  Neuro: Alert, oriented  Assessment and Plan   Please refer to problem based charting of assessment and plan

## 2015-03-20 MED ORDER — ACCU-CHEK AVIVA PLUS W/DEVICE KIT: PACK | Status: DC

## 2015-03-20 NOTE — Telephone Encounter (Signed)
Meter prescription sent to pharmacy.

## 2015-03-23 NOTE — Assessment & Plan Note (Signed)
Not controlled today, however, has been relatively controlled most visits. Will not make any change today and will recheck at next visit.

## 2015-03-23 NOTE — Assessment & Plan Note (Signed)
This appears to be more recurrent than patient leads me to believe. Nothing worrisome on exam. Possibly related to metformin vs constipation. Will stop metformin and switch antihyperglycemic.

## 2015-03-23 NOTE — Assessment & Plan Note (Signed)
A1C improved today. Patient still not checking blood sugars as she says her son took her meter. Will switch to glipizide 5mg  daily since patient not tolerating metformin.

## 2015-04-02 ENCOUNTER — Telehealth: Payer: Self-pay | Admitting: Family Medicine

## 2015-04-02 NOTE — Telephone Encounter (Signed)
Patient is calling to cancel her [facility] appt for tomorrow. She states that she will not reschedule until her PCP can call her and inform her as to what it is and what happens during these visits and she wants to know why she must attend this clinic. Additionally, she has several braces on her body and thinks that she needs to see someone who can teach her how to put them on. She would like to know who she should see. Thank you. Kristine Boyer, ASA

## 2015-04-04 ENCOUNTER — Ambulatory Visit: Payer: Commercial Managed Care - HMO

## 2015-04-05 NOTE — Telephone Encounter (Signed)
Called patient with no response. Left message to call back.

## 2015-04-24 ENCOUNTER — Telehealth: Payer: Self-pay | Admitting: Family Medicine

## 2015-04-24 NOTE — Telephone Encounter (Signed)
Pt called because is now using [facility] online [facility]. She would like the doctor to call in a new Meter and all the strips and supplies that she will need. The fax number is [phone] and the office number is [phone]. Blima Rich

## 2015-05-01 DIAGNOSIS — H04123 Dry eye syndrome of bilateral lacrimal glands: Secondary | ICD-10-CM | POA: Diagnosis not present

## 2015-05-01 DIAGNOSIS — Z961 Presence of intraocular lens: Secondary | ICD-10-CM | POA: Diagnosis not present

## 2015-05-23 MED ORDER — ACCU-CHEK FASTCLIX LANCETS MISC: 1.0000 | Freq: Every day | Status: DC

## 2015-05-23 MED ORDER — GLUCOSE BLOOD VI STRP: 1.0000 | ORAL_STRIP | Freq: Every day | Status: DC

## 2015-05-23 MED ORDER — ACCU-CHEK AVIVA PLUS W/DEVICE KIT
PACK | Status: DC
Start: 2015-05-23 — End: 2017-03-01

## 2015-06-14 ENCOUNTER — Ambulatory Visit (INDEPENDENT_AMBULATORY_CARE_PROVIDER_SITE_OTHER): Payer: Commercial Managed Care - HMO | Admitting: Podiatry

## 2015-06-14 ENCOUNTER — Encounter: Payer: Self-pay | Admitting: Podiatry

## 2015-06-14 DIAGNOSIS — E1149 Type 2 diabetes mellitus with other diabetic neurological complication: Secondary | ICD-10-CM

## 2015-06-14 DIAGNOSIS — M79676 Pain in unspecified toe(s): Secondary | ICD-10-CM

## 2015-06-14 DIAGNOSIS — Q828 Other specified congenital malformations of skin: Secondary | ICD-10-CM

## 2015-06-14 DIAGNOSIS — E1140 Type 2 diabetes mellitus with diabetic neuropathy, unspecified: Secondary | ICD-10-CM

## 2015-06-14 DIAGNOSIS — B351 Tinea unguium: Secondary | ICD-10-CM | POA: Diagnosis not present

## 2015-06-14 NOTE — Patient Instructions (Signed)
Over the counter moisturizer is called Amlactin. Do not apply between toes.  Over the counter medication for nail fungus is called funginail

## 2015-06-18 NOTE — Progress Notes (Signed)
Patient ID: Kristine Boyer, female   DOB: 24-Apr-1941, 74 y.o.   MRN: [id number]  Subjective: 74 year-old female returns the office today for painful, elongated, thickened toenails which she is unable to trim herself. Denies any redness or drainage around the nails. Denies any acute changes since last appointment and no new complaints today. Denies any systemic complaints such as fevers, chills, nausea, vomiting.   Objective: AAO 3, NAD DP/PT pulses palpable, CRT less than 3 seconds Protective sensation decreased with Simms Weinstein monofilament Nails hypertrophic, dystrophic, elongated, brittle, discolored 10. There is tenderness overlying the nails 1-5 bilaterally. There is no surrounding erythema or drainage along the nail sites. Small hyperkeratotic lesions bilateral dorsal lateral fifth digit with adductovarus deformity of the digits. Hammertoe contractures identified. No open lesions or pre-ulcerative lesions are identified. No other areas of tenderness bilateral lower extremities. No overlying edema, erythema, increased warmth. No pain with calf compression, swelling, warmth, erythema.  Assessment: Patient presents with symptomatic onychomycosis  Plan: -Treatment options including alternatives, risks, complications were discussed -Nails sharply debrided 10 without complication/bleeding. -Hyperkeratotic lesions debrided 2 without complication/bleeding. -Discussed daily foot inspection. If there are any changes, to call the office immediately.  -Follow-up in 3 months or sooner if any problems are to arise. In the meantime, encouraged to call the office with any questions, concerns, changes symptoms.  [name], DPM

## 2015-08-05 ENCOUNTER — Encounter: Payer: Self-pay | Admitting: Internal Medicine

## 2015-08-05 ENCOUNTER — Emergency Department (HOSPITAL_COMMUNITY)
Admission: EM | Admit: 2015-08-05 | Discharge: 2015-08-05 | Disposition: A | Discharge status: Home / Self Care | Payer: Commercial Managed Care - HMO | Attending: Emergency Medicine | Admitting: Emergency Medicine

## 2015-08-05 ENCOUNTER — Emergency Department (HOSPITAL_COMMUNITY): Payer: Commercial Managed Care - HMO

## 2015-08-05 ENCOUNTER — Other Ambulatory Visit: Payer: Self-pay

## 2015-08-05 ENCOUNTER — Ambulatory Visit (INDEPENDENT_AMBULATORY_CARE_PROVIDER_SITE_OTHER): Payer: Commercial Managed Care - HMO | Admitting: Internal Medicine

## 2015-08-05 ENCOUNTER — Encounter (HOSPITAL_COMMUNITY): Payer: Self-pay | Admitting: *Deleted

## 2015-08-05 ENCOUNTER — Ambulatory Visit (HOSPITAL_COMMUNITY)
Admission: RE | Admit: 2015-08-05 | Discharge: 2015-08-05 | Disposition: A | Discharge status: Home / Self Care | Payer: Commercial Managed Care - HMO | Source: Ambulatory Visit | Attending: Family Medicine | Admitting: Family Medicine

## 2015-08-05 VITALS — BP 142/98 | HR 71 | Temp 98.2°F | Ht 63.0 in | Wt 174.0 lb

## 2015-08-05 DIAGNOSIS — Z86010 Personal history of colonic polyps: Secondary | ICD-10-CM | POA: Insufficient documentation

## 2015-08-05 DIAGNOSIS — Z79899 Other long term (current) drug therapy: Secondary | ICD-10-CM | POA: Insufficient documentation

## 2015-08-05 DIAGNOSIS — D649 Anemia, unspecified: Secondary | ICD-10-CM | POA: Insufficient documentation

## 2015-08-05 DIAGNOSIS — I10 Essential (primary) hypertension: Secondary | ICD-10-CM | POA: Insufficient documentation

## 2015-08-05 DIAGNOSIS — H269 Unspecified cataract: Secondary | ICD-10-CM | POA: Insufficient documentation

## 2015-08-05 DIAGNOSIS — R079 Chest pain, unspecified: Secondary | ICD-10-CM | POA: Diagnosis not present

## 2015-08-05 DIAGNOSIS — R072 Precordial pain: Secondary | ICD-10-CM | POA: Diagnosis not present

## 2015-08-05 DIAGNOSIS — Z8659 Personal history of other mental and behavioral disorders: Secondary | ICD-10-CM | POA: Insufficient documentation

## 2015-08-05 DIAGNOSIS — E039 Hypothyroidism, unspecified: Secondary | ICD-10-CM | POA: Insufficient documentation

## 2015-08-05 DIAGNOSIS — Z7982 Long term (current) use of aspirin: Secondary | ICD-10-CM | POA: Insufficient documentation

## 2015-08-05 DIAGNOSIS — E785 Hyperlipidemia, unspecified: Secondary | ICD-10-CM | POA: Diagnosis not present

## 2015-08-05 DIAGNOSIS — K219 Gastro-esophageal reflux disease without esophagitis: Secondary | ICD-10-CM | POA: Insufficient documentation

## 2015-08-05 DIAGNOSIS — G309 Alzheimer's disease, unspecified: Secondary | ICD-10-CM | POA: Diagnosis not present

## 2015-08-05 DIAGNOSIS — E119 Type 2 diabetes mellitus without complications: Secondary | ICD-10-CM | POA: Insufficient documentation

## 2015-08-05 DIAGNOSIS — I209 Angina pectoris, unspecified: Secondary | ICD-10-CM | POA: Diagnosis not present

## 2015-08-05 DIAGNOSIS — Z87891 Personal history of nicotine dependence: Secondary | ICD-10-CM | POA: Diagnosis not present

## 2015-08-05 LAB — COMPREHENSIVE METABOLIC PANEL
ALBUMIN: 4 g/dL (ref 3.5–5.0)
ALT: 13 U/L — ABNORMAL LOW (ref 14–54)
ANION GAP: 8 (ref 5–15)
AST: 26 U/L (ref 15–41)
Alkaline Phosphatase: 87 U/L (ref 38–126)
BILIRUBIN TOTAL: 0.7 mg/dL (ref 0.3–1.2)
BUN: 8 mg/dL (ref 6–20)
CHLORIDE: 102 mmol/L (ref 101–111)
CO2: 32 mmol/L (ref 22–32)
Calcium: 9.5 mg/dL (ref 8.9–10.3)
Creatinine, Ser: 0.97 mg/dL (ref 0.44–1.00)
GFR calc Af Amer: >60 mL/min (ref >60)
GFR, EST NON AFRICAN AMERICAN: 57 mL/min — AB (ref >60)
Glucose, Bld: 107 mg/dL — ABNORMAL HIGH (ref 65–99)
POTASSIUM: 3.1 mmol/L — AB (ref 3.5–5.1)
Sodium: 142 mmol/L (ref 135–145)
TOTAL PROTEIN: 7.5 g/dL (ref 6.5–8.1)

## 2015-08-05 LAB — CBC WITH DIFFERENTIAL/PLATELET
BASOS PCT: 0 %
Basophils Absolute: 0 10*3/uL (ref 0.0–0.1)
EOS PCT: 2 %
Eosinophils Absolute: 0.1 10*3/uL (ref 0.0–0.7)
HEMATOCRIT: 36 % (ref 36.0–46.0)
Hemoglobin: 12.1 g/dL (ref 12.0–15.0)
Lymphocytes Relative: 40 %
Lymphs Abs: 3 10*3/uL (ref 0.7–4.0)
MCH: 27.1 pg (ref 26.0–34.0)
MCHC: 33.6 g/dL (ref 30.0–36.0)
MCV: 80.7 fL (ref 78.0–100.0)
MONO ABS: 0.6 10*3/uL (ref 0.1–1.0)
MONOS PCT: 8 %
NEUTROS ABS: 3.8 10*3/uL (ref 1.7–7.7)
Neutrophils Relative %: 50 %
PLATELETS: 175 10*3/uL (ref 150–400)
RBC: 4.46 MIL/uL (ref 3.87–5.11)
RDW: 13.1 % (ref 11.5–15.5)
WBC: 7.5 10*3/uL (ref 4.0–10.5)

## 2015-08-05 LAB — TROPONIN I: Troponin I: <0.03 ng/mL (ref <0.031)

## 2015-08-05 LAB — I-STAT TROPONIN, ED: Troponin i, poc: 0.01 ng/mL (ref 0.00–0.08)

## 2015-08-05 MED ORDER — ASPIRIN 81 MG PO CHEW
324.0000 mg | CHEWABLE_TABLET | Freq: Once | ORAL | Status: AC
  Administered 2015-08-05: 324 mg via ORAL
  Filled 2015-08-05: qty 4

## 2015-08-05 MED ORDER — IRBESARTAN 300 MG PO TABS
300.0000 mg | ORAL_TABLET | Freq: Every day | ORAL | Status: DC
  Administered 2015-08-05: 300 mg via ORAL
  Filled 2015-08-05: qty 1

## 2015-08-05 MED ORDER — METOPROLOL SUCCINATE ER 25 MG PO TB24
100.0000 mg | ORAL_TABLET | Freq: Every day | ORAL | Status: DC
  Administered 2015-08-05: 100 mg via ORAL
  Filled 2015-08-05: qty 4

## 2015-08-05 NOTE — ED Notes (Signed)
Pt arrives from [facility] via [facility]. Pt states she began having nonradiating substernal cp this morning at 0900 that continued to get worse throughout the day. Pt states when she was having the CP she had sob, dizziness, and lightheadedness.

## 2015-08-05 NOTE — ED Notes (Signed)
Patient verbalized understanding r/e discharge instructions and BP medications/home-health.  Family also verbalized understanding. VS stable.

## 2015-08-05 NOTE — Assessment & Plan Note (Signed)
Chest Pain: No current chest pain. This AM described typical chest pain worse with exertion, better with rest, substernal in nature, possibly cardiac.   - EKG with slight T wave inversions in V5/V6, possibly left ventricular hypertrophy (this is seen on previous EKG in the past)  - Will send to the ED for further assessments - Patient not currently with chest pain so will hold off on nitroglycerin and aspirin for now.

## 2015-08-05 NOTE — Discharge Instructions (Signed)
Hypertension Hypertension, commonly called high blood pressure, is when the force of blood pumping through your arteries is too strong. Your arteries are the blood vessels that carry blood from your heart throughout your body. A blood pressure reading consists of a higher number over a lower number, such as 110/72. The higher number (systolic) is the pressure inside your arteries when your heart pumps. The lower number (diastolic) is the pressure inside your arteries when your heart relaxes. Ideally you want your blood pressure below 120/80. Hypertension forces your heart to work harder to pump blood. Your arteries may become narrow or stiff. Having untreated or uncontrolled hypertension can cause heart attack, stroke, kidney disease, and other problems. RISK FACTORS Some risk factors for high blood pressure are controllable. Others are not.  Risk factors you cannot control include:   Race. You may be at higher risk if you are African American.  Age. Risk increases with age.  Gender. Men are at higher risk than women before age 45 years. After age 65, women are at higher risk than men. Risk factors you can control include:  Not getting enough exercise or physical activity.  Being overweight.  Getting too much fat, sugar, calories, or salt in your diet.  Drinking too much alcohol. SIGNS AND SYMPTOMS Hypertension does not usually cause signs or symptoms. Extremely high blood pressure (hypertensive crisis) may cause headache, anxiety, shortness of breath, and nosebleed. DIAGNOSIS To check if you have hypertension, your health care provider will measure your blood pressure while you are seated, with your arm held at the level of your heart. It should be measured at least twice using the same arm. Certain conditions can cause a difference in blood pressure between your right and left arms. A blood pressure reading that is higher than normal on one occasion does not mean that you need treatment. If  it is not clear whether you have high blood pressure, you may be asked to return on a different day to have your blood pressure checked again. Or, you may be asked to monitor your blood pressure at home for 1 or more weeks. TREATMENT Treating high blood pressure includes making lifestyle changes and possibly taking medicine. Living a healthy lifestyle can help lower high blood pressure. You may need to change some of your habits. Lifestyle changes may include:  Following the DASH diet. This diet is high in fruits, vegetables, and whole grains. It is low in salt, red meat, and added sugars.  Keep your sodium intake below 2,300 mg per day.  Getting at least 30-45 minutes of aerobic exercise at least 4 times per week.  Losing weight if necessary.  Not smoking.  Limiting alcoholic beverages.  Learning ways to reduce stress. Your health care provider may prescribe medicine if lifestyle changes are not enough to get your blood pressure under control, and if one of the following is true:  You are 18-59 years of age and your systolic blood pressure is above 140.  You are 60 years of age or older, and your systolic blood pressure is above 150.  Your diastolic blood pressure is above 90.  You have diabetes, and your systolic blood pressure is over 140 or your diastolic blood pressure is over 90.  You have kidney disease and your blood pressure is above 140/90.  You have heart disease and your blood pressure is above 140/90. Your personal target blood pressure may vary depending on your medical conditions, your age, and other factors. HOME CARE INSTRUCTIONS    Have your blood pressure rechecked as directed by your health care provider.   Take medicines only as directed by your health care provider. Follow the directions carefully. Blood pressure medicines must be taken as prescribed. The medicine does not work as well when you skip doses. Skipping doses also puts you at risk for  problems.  Do not smoke.   Monitor your blood pressure at home as directed by your health care provider. SEEK MEDICAL CARE IF:   You think you are having a reaction to medicines taken.  You have recurrent headaches or feel dizzy.  You have swelling in your ankles.  You have trouble with your vision. SEEK IMMEDIATE MEDICAL CARE IF:  You develop a severe headache or confusion.  You have unusual weakness, numbness, or feel faint.  You have severe chest or abdominal pain.  You vomit repeatedly.  You have trouble breathing. MAKE SURE YOU:   Understand these instructions.  Will watch your condition.  Will get help right away if you are not doing well or get worse.   This information is not intended to replace advice given to you by your health care provider. Make sure you discuss any questions you have with your health care provider.   Document Released: 09/14/2005 Document Revised: 01/29/2015 Document Reviewed: 07/07/2013 Elsevier Interactive Patient Education 2016 [employer]  

## 2015-08-05 NOTE — Patient Instructions (Signed)
Evaluation of Chest pain in the emergency room, with outpatient follow up

## 2015-08-05 NOTE — ED Provider Notes (Signed)
CSN: [id number]     Arrival date & time 08/05/15  1548 History   First MD Initiated Contact with Patient 08/05/15 1601     Chief Complaint  Patient presents with  . Chest Pain     (Consider location/radiation/quality/duration/timing/severity/associated sxs/prior Treatment) Patient is a 74 y.o. female presenting with chest pain. The history is provided by the patient.  Chest Pain Pain location:  Substernal area Pain quality: dull   Pain quality: not crushing, not radiating, not sharp, not shooting, not stabbing and not tearing   Pain radiates to:  Does not radiate Pain radiates to the back: no   Pain severity:  Mild Onset quality:  Gradual Timing:  Constant Progression:  Worsening Chronicity:  New Context: not breathing, no drug use and not lifting   Relieved by:  None tried Associated symptoms: dizziness   Associated symptoms: no abdominal pain, no diaphoresis, no fever, no nausea and no shortness of breath   Risk factors: diabetes mellitus and hypertension   Risk factors: no prior DVT/PE     Past Medical History  Diagnosis Date  . Diabetes mellitus   . Immune deficiency disorder ([place])   . Hypertension   . Hypothyroidism   . Bipolar affective ([place])   . Alzheimer disease   . Allergy   . Anemia   . Blood transfusion without reported diagnosis   . Cataract   . GERD (gastroesophageal reflux disease)   . Seizures ([place])   . Hyperlipidemia   . Internal hemorrhoids   . Adenomatous colon polyp    Past Surgical History  Procedure Laterality Date  . Abdominal hysterectomy    . Tonsilectomy, adenoidectomy, bilateral myringotomy and tubes     Family History  Problem Relation Age of Onset  . Diabetes Mother   . Colon cancer      grandmother  . Stomach cancer Sister    Social History  Substance Use Topics  . Smoking status: Former Smoker    Quit date: 10/13/2011  . Smokeless tobacco: Never Used  . Alcohol Use: No   OB History    No data available     Review of  Systems  Constitutional: Negative for fever and diaphoresis.  Respiratory: Negative for shortness of breath.   Cardiovascular: Positive for chest pain.  Gastrointestinal: Negative for nausea and abdominal pain.  Neurological: Positive for dizziness.    All systems reviewed and negative, other than as noted in HPI.\   Allergies  Lactose intolerance (gi); Vicodin; and Neurontin  Home Medications   Prior to Admission medications   Medication Sig Start Date End Date Taking? Authorizing Provider  aspirin 81 MG tablet Take 81 mg by mouth daily.   Yes Historical Provider, MD  atorvastatin (LIPITOR) 40 MG tablet Take 1 tablet (40 mg total) by mouth daily. 10/04/13  Yes Waldemar Dickens, MD  metoprolol (TOPROL-XL) 200 MG 24 hr tablet Take 1 tablet (200 mg total) by mouth daily. 02/05/15  Yes Mariel Aloe, MD  valsartan (DIOVAN) 320 MG tablet Take 1 tablet (320 mg total) by mouth daily. 05/14/14  Yes Mariel Aloe, MD  vitamin B-12 (CYANOCOBALAMIN) 1000 MCG tablet Take 1,000 mcg by mouth daily.   Yes Historical Provider, MD  ACCU-CHEK FASTCLIX LANCETS MISC 1 each by Does not apply route daily before breakfast. 05/23/15   Mariel Aloe, MD  aspirin EC 81 MG tablet Take 81 mg by mouth daily. 10/14/11 11/01/13  Waldemar Dickens, MD  baclofen (LIORESAL) 10 MG tablet Take  1 tablet (10 mg total) by mouth 3 (three) times daily as needed for muscle spasms. Patient not taking: Reported on 08/05/2015 12/27/13   Waldemar Dickens, MD  Blood Glucose Monitoring Suppl (ACCU-CHEK AVIVA PLUS) W/DEVICE KIT Please check your blood sugar once per day when you wake up. 05/23/15   Mariel Aloe, MD  cephALEXin (KEFLEX) 500 MG capsule Take 1 capsule (500 mg total) by mouth 2 (two) times daily. Patient not taking: Reported on 08/05/2015 12/17/14   Mariel Aloe, MD  cetirizine (ZYRTEC) 10 MG tablet Take 1 tablet (10 mg total) by mouth daily. Patient not taking: Reported on 08/05/2015 05/14/14   Mariel Aloe, MD   clotrimazole-betamethasone (LOTRISONE) cream Apply to affected area 2 times daily prn Patient not taking: Reported on 08/05/2015 12/20/14   Janne Napoleon, NP  fluconazole (DIFLUCAN) 150 MG tablet 1 tab po x 1. May repeat in 72 hours if no improvement Patient not taking: Reported on 08/05/2015 12/20/14   Janne Napoleon, NP  glipiZIDE (GLUCOTROL) 5 MG tablet Take 1 tablet (5 mg total) by mouth daily before breakfast. Patient not taking: Reported on 08/05/2015 03/19/15   Mariel Aloe, MD  glucose blood test strip 1 each by Other route daily before breakfast. 05/23/15   Mariel Aloe, MD  levothyroxine (SYNTHROID, LEVOTHROID) 175 MCG tablet Take 1 tablet (175 mcg total) by mouth daily. Patient not taking: Reported on 08/05/2015 10/04/13   Waldemar Dickens, MD  meloxicam (MOBIC) 7.5 MG tablet Take 1-2 tablets (7.5-15 mg total) by mouth daily. Patient not taking: Reported on 08/05/2015 07/28/13   Waldemar Dickens, MD  metroNIDAZOLE (FLAGYL) 500 MG tablet Take 1 tablet (500 mg total) by mouth 2 (two) times daily. X 7 days Patient not taking: Reported on 08/05/2015 12/24/14   Janne Napoleon, NP  pantoprazole (PROTONIX) 40 MG tablet Take 1 tablet (40 mg total) by mouth daily. Patient not taking: Reported on 08/05/2015 06/19/14   Mariel Aloe, MD  phenazopyridine (PYRIDIUM) 100 MG tablet Take 1 tablet (100 mg total) by mouth 3 (three) times daily as needed for pain. Patient not taking: Reported on 08/05/2015 03/21/14   Waldemar Dickens, MD  polyethylene glycol powder (GLYCOLAX/MIRALAX) powder Take 17 g by mouth daily. Patient not taking: Reported on 08/05/2015 12/17/14   Mariel Aloe, MD  potassium chloride SA (K-DUR,KLOR-CON) 20 MEQ tablet Take 1 tablet (20 mEq total) by mouth daily. Patient not taking: Reported on 08/05/2015 10/20/13   Waldemar Dickens, MD  pregabalin (LYRICA) 25 MG capsule Take 1 capsule (25 mg total) by mouth 3 (three) times daily. Patient not taking: Reported on 08/05/2015 11/15/14   Mariel Aloe, MD  senna  (SENOKOT) 8.6 MG tablet Take 2 tablets (17.2 mg total) by mouth 2 (two) times daily. Once achieve daily BM decrease to one tablet daily Patient not taking: Reported on 08/05/2015 10/24/13   Waldemar Dickens, MD  sodium phosphate (FLEET) enema Place 1 enema rectally once. follow package directions Patient not taking: Reported on 08/05/2015 10/24/13   Waldemar Dickens, MD  triamcinolone (NASACORT AQ) 55 MCG/ACT AERO nasal inhaler Place 2 sprays into the nose daily. Patient not taking: Reported on 08/05/2015 05/14/14   Mariel Aloe, MD   BP 175/93 mmHg  Pulse 61  Temp(Src) 98.1 F (36.7 C) (Oral)  Resp 12  Ht 5' 2.5" (1.588 m)  Wt 174 lb 6 oz (79.096 kg)  BMI 31.37 kg/m2  SpO2 100% Physical Exam  Constitutional: She appears well-developed and well-nourished. No distress.  HENT:  Head: Normocephalic and atraumatic.  Eyes: Conjunctivae are normal. Right eye exhibits no discharge. Left eye exhibits no discharge.  Neck: Neck supple.  Cardiovascular: Normal rate, regular rhythm and normal heart sounds.  Exam reveals no gallop and no friction rub.   No murmur heard. Pulmonary/Chest: Effort normal and breath sounds normal. No respiratory distress. She exhibits no tenderness.  Abdominal: Soft. She exhibits no distension. There is no tenderness.  Musculoskeletal: She exhibits no edema or tenderness.  Lower extremities symmetric as compared to each other. No calf tenderness. Negative Homan's. No palpable cords.   Neurological: She is alert.  Skin: Skin is warm and dry.  Psychiatric: She has a normal mood and affect. Her behavior is normal. Thought content normal.  Nursing note and vitals reviewed.   ED Course  Procedures (including critical care time) Labs Review Labs Reviewed  COMPREHENSIVE METABOLIC PANEL - Abnormal; Notable for the following:    Potassium 3.1 (*)    Glucose, Bld 107 (*)    ALT 13 (*)    GFR calc non Af Amer 57 (*)    All other components within normal limits  CBC WITH  DIFFERENTIAL/PLATELET  TROPONIN I  Randolm Idol, ED    Imaging Review Dg Chest 2 View  08/05/2015  CLINICAL DATA:  Acute chest pain EXAM: CHEST  2 VIEW COMPARISON:  12/09/2011, 12/08/2012 FINDINGS: The heart size and mediastinal contours are within normal limits. Both lungs are clear. Diffuse DISH and degenerative changes of the thoracic spine. No compression fracture. Mild scoliosis at the thoracolumbar junction. IMPRESSION: No active cardiopulmonary disease. Electronically Signed   By: Jerilynn Mages.  Shick M.D.   On: 08/05/2015 16:58   I have personally reviewed and evaluated these images and lab results as part of my medical decision-making.   EKG Interpretation   Date/Time:  Monday August 05 2015 15:53:32 EST Ventricular Rate:  63 PR Interval:  335 QRS Duration: 101 QT Interval:  446 QTC Calculation: 457 R Axis:   15 Text Interpretation:  Sinus or ectopic atrial rhythm Prolonged PR interval  Non-specific ST-t changes Confirmed by Wilson Singer  MD, Daylen Hack ([id number]) on  08/05/2015 4:04:08 PM      MDM   Final diagnoses:  Chest pain, unspecified chest pain type  Essential hypertension        Virgel Manifold, MD 08/18/15 1551

## 2015-08-05 NOTE — Progress Notes (Signed)
Patient ID: Kristine Boyer, female   DOB: 1940-10-13, 74 y.o.   MRN: [id number]    [facility] Kerrin Mo, MD Phone: [phone]  Reason For Visit: Same Day Appointment for Chest Pain             Patient with pmhx of T2DM, HTN, HLD presenting with chest pain. Sudden chest pain around 9:30-10 AM this morning associated with exertion. After this exertional onset, patient lay down and took a nap. After the nap, patients chest pain resolved. However patient decide to come to the family medicine clinic to get herself checked.   Chest pain was midsternal to substernal in nature. Pain was worse with anxiety, frustration and movement. Pain improved with rest. Pain lasted for about an 1 hour. It had an exertional onset. Pain radiating to the back. Patient with a remote hx of chest pain in the past, however not is the last several years.  Symptoms History of Trauma/lifting: no  Nausea/vomiting: no  Diaphoresis: no  Shortness of breath: yes  Pleuritic: yes  Cough: No  Edema: no  Orthopnea: yes  PND: no  Dizziness: Yes, noted being dizzy with chest pain  Palpitations: no  Syncope: no  Indigestion: Patient has hx of heartburn, but states that this feels different   Red Flags Worse with exertion: yes  Recent Immobility: no  Cancer history: no  Tearing/radiation to back: no  Past Medical History Reviewed problem list.  Medications- reviewed and updated No additions to family history Social history- patient is a not smoker, however her friend Fritz Pickerel smokes all the time   Objective: BP 142/98 mmHg  Pulse 71  Temp(Src) 98.2 F (36.8 C) (Oral)  Ht 5\' 3"  (1.6 m)  Wt 174 lb (78.926 kg)  BMI 30.83 kg/m2  SpO2 98% Gen: NAD, alert, cooperative with exam CV: RRR, good S1/S2, no murmur, 2+ radial pulses, chest pain with palpation.  Resp: CTABL, no wheezes, non-labored Abd: SNTND, BS present, no guarding or organomegaly Ext: No edema,no lower extremity swelling or  redness, negative homan's sign  Neuro: Alert and oriented, No gross deficits Skin: no rashes no lesions  EKG: slight T wave inversions in V5, V6, Left ventricular hypertrophy (seen on previous EKG in 2012)  Assessment/Plan: See problem based a/p  Angina pectoris, unspecified ([place]) Chest Pain: No current chest pain. This AM described typical chest pain worse with exertion, better with rest, substernal in nature, possibly cardiac.   - EKG with slight T wave inversions in V5/V6, possibly left ventricular hypertrophy (this is seen on previous EKG in the past)  - Will send to the ED for further assessments - Patient not currently with chest pain so will hold off on nitroglycerin and aspirin for now.

## 2015-08-08 DIAGNOSIS — E785 Hyperlipidemia, unspecified: Secondary | ICD-10-CM | POA: Diagnosis not present

## 2015-08-08 DIAGNOSIS — I209 Angina pectoris, unspecified: Secondary | ICD-10-CM | POA: Diagnosis not present

## 2015-08-08 DIAGNOSIS — Z7982 Long term (current) use of aspirin: Secondary | ICD-10-CM | POA: Diagnosis not present

## 2015-08-08 DIAGNOSIS — R0602 Shortness of breath: Secondary | ICD-10-CM | POA: Diagnosis not present

## 2015-08-08 DIAGNOSIS — E119 Type 2 diabetes mellitus without complications: Secondary | ICD-10-CM | POA: Diagnosis not present

## 2015-08-08 DIAGNOSIS — I10 Essential (primary) hypertension: Secondary | ICD-10-CM | POA: Diagnosis not present

## 2015-08-13 DIAGNOSIS — I209 Angina pectoris, unspecified: Secondary | ICD-10-CM | POA: Diagnosis not present

## 2015-08-13 DIAGNOSIS — I10 Essential (primary) hypertension: Secondary | ICD-10-CM | POA: Diagnosis not present

## 2015-08-13 DIAGNOSIS — Z7982 Long term (current) use of aspirin: Secondary | ICD-10-CM | POA: Diagnosis not present

## 2015-08-13 DIAGNOSIS — R0602 Shortness of breath: Secondary | ICD-10-CM | POA: Diagnosis not present

## 2015-08-13 DIAGNOSIS — E785 Hyperlipidemia, unspecified: Secondary | ICD-10-CM | POA: Diagnosis not present

## 2015-08-13 DIAGNOSIS — E119 Type 2 diabetes mellitus without complications: Secondary | ICD-10-CM | POA: Diagnosis not present

## 2015-08-15 DIAGNOSIS — R0602 Shortness of breath: Secondary | ICD-10-CM | POA: Diagnosis not present

## 2015-08-15 DIAGNOSIS — E119 Type 2 diabetes mellitus without complications: Secondary | ICD-10-CM | POA: Diagnosis not present

## 2015-08-15 DIAGNOSIS — E785 Hyperlipidemia, unspecified: Secondary | ICD-10-CM | POA: Diagnosis not present

## 2015-08-15 DIAGNOSIS — I209 Angina pectoris, unspecified: Secondary | ICD-10-CM | POA: Diagnosis not present

## 2015-08-15 DIAGNOSIS — Z7982 Long term (current) use of aspirin: Secondary | ICD-10-CM | POA: Diagnosis not present

## 2015-08-15 DIAGNOSIS — I10 Essential (primary) hypertension: Secondary | ICD-10-CM | POA: Diagnosis not present

## 2015-08-21 ENCOUNTER — Ambulatory Visit: Payer: Commercial Managed Care - HMO | Admitting: Family Medicine

## 2015-09-27 ENCOUNTER — Ambulatory Visit: Payer: Medicare HMO | Admitting: Podiatry

## 2015-10-15 ENCOUNTER — Telehealth: Payer: Self-pay | Admitting: *Deleted

## 2015-10-15 NOTE — Telephone Encounter (Signed)
Called patient to offer flu vaccine, however, VM picked up.  Left message on patient's voice mail to return call. DUCATTE, LAURENZE L, RN   

## 2015-11-19 NOTE — Telephone Encounter (Signed)
Attempted to reach patient to offer flu vaccine, however, message states that number or code is incorrect. Reached daughter Smiley Houseman at [phone] who states patient now has alzheimer's.  Please use daughter's number for all future calls. Anderson Malta will call back to schedule flu vaccine if patient says okay. Velora Heckler, RN

## 2016-01-07 ENCOUNTER — Encounter (HOSPITAL_COMMUNITY): Payer: Self-pay | Admitting: Emergency Medicine

## 2016-01-07 ENCOUNTER — Ambulatory Visit (HOSPITAL_COMMUNITY)
Admission: EM | Admit: 2016-01-07 | Discharge: 2016-01-07 | Disposition: A | Discharge status: Home / Self Care | Payer: No Typology Code available for payment source | Attending: Family Medicine | Admitting: Family Medicine

## 2016-01-07 ENCOUNTER — Ambulatory Visit (INDEPENDENT_AMBULATORY_CARE_PROVIDER_SITE_OTHER): Payer: No Typology Code available for payment source

## 2016-01-07 DIAGNOSIS — I10 Essential (primary) hypertension: Secondary | ICD-10-CM

## 2016-01-07 DIAGNOSIS — M542 Cervicalgia: Secondary | ICD-10-CM | POA: Diagnosis not present

## 2016-01-07 MED ORDER — VALSARTAN 320 MG PO TABS: 320.0000 mg | ORAL_TABLET | Freq: Every day | ORAL | Status: DC

## 2016-01-07 MED ORDER — METOPROLOL SUCCINATE ER 200 MG PO TB24
200.0000 mg | ORAL_TABLET | Freq: Every day | ORAL | Status: DC
Start: 2016-01-07 — End: 2016-03-16

## 2016-01-07 MED ORDER — LEVOTHYROXINE SODIUM 175 MCG PO TABS: 175.0000 ug | ORAL_TABLET | Freq: Every day | ORAL | Status: DC
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
## 2016-01-07 NOTE — Discharge Instructions (Signed)

## 2016-01-07 NOTE — ED Notes (Signed)
D/c by Frank Patrick, PA  

## 2016-01-14 ENCOUNTER — Encounter: Payer: Commercial Managed Care - HMO | Admitting: Family Medicine

## 2016-01-24 ENCOUNTER — Encounter: Payer: Commercial Managed Care - HMO | Admitting: Family Medicine

## 2016-01-24 ENCOUNTER — Other Ambulatory Visit: Payer: Self-pay | Admitting: Family Medicine

## 2016-03-16 ENCOUNTER — Other Ambulatory Visit: Payer: Self-pay | Admitting: *Deleted

## 2016-03-16 MED ORDER — METOPROLOL SUCCINATE ER 200 MG PO TB24: 200.0000 mg | ORAL_TABLET | Freq: Every day | ORAL | Status: DC

## 2016-03-16 NOTE — Telephone Encounter (Signed)
Will approve one refill. Patient requires a follow-up visit for future refills.

## 2016-03-17 MED ORDER — METOPROLOL SUCCINATE ER 200 MG PO TB24: 200.0000 mg | ORAL_TABLET | Freq: Every day | ORAL | Status: DC

## 2016-03-17 NOTE — Addendum Note (Signed)
Addended by: Derl Barrow on: 03/17/2016 02:00 PM   Modules accepted: Orders

## 2016-03-17 NOTE — Telephone Encounter (Signed)
Received another refill request for metoprolol succ ER.  Refilled was approved, however it stated print.  Rx resent electronically.  Derl Barrow, RN

## 2016-05-29 ENCOUNTER — Ambulatory Visit (HOSPITAL_COMMUNITY)
Admission: EM | Admit: 2016-05-29 | Discharge: 2016-05-29 | Disposition: A | Discharge status: Home / Self Care | Payer: Commercial Managed Care - HMO | Attending: Family Medicine | Admitting: Family Medicine

## 2016-05-29 ENCOUNTER — Ambulatory Visit (INDEPENDENT_AMBULATORY_CARE_PROVIDER_SITE_OTHER): Payer: Commercial Managed Care - HMO

## 2016-05-29 ENCOUNTER — Encounter (HOSPITAL_COMMUNITY): Payer: Self-pay | Admitting: Emergency Medicine

## 2016-05-29 DIAGNOSIS — S92911A Unspecified fracture of right toe(s), initial encounter for closed fracture: Secondary | ICD-10-CM

## 2016-05-29 DIAGNOSIS — S92511A Displaced fracture of proximal phalanx of right lesser toe(s), initial encounter for closed fracture: Secondary | ICD-10-CM | POA: Diagnosis not present

## 2016-05-29 NOTE — ED Triage Notes (Signed)
Reports she stumped her 4th right toe 2 days ago against a chair.... Sx include: swelling, pain... Hurts to bear wt  A&O x4... NAD

## 2016-05-29 NOTE — ED Provider Notes (Signed)
CSN: [id number]     Arrival date & time 05/29/16  1539 History   First MD Initiated Contact with Patient 05/29/16 1635     Chief Complaint  Patient presents with  . Toe Injury   (Consider location/radiation/quality/duration/timing/severity/associated sxs/prior Treatment) 75 year old female complaining of right foot pain after she struck her foot against the base of a rocking chair 2-3 days ago. She points to the right fourth toe as the source of pain. She demonstrates movement and palpation of all of the toes with the exception of fourth toe without pain or limited range of motion. Ambulatory with full weightbearing.      Past Medical History:  Diagnosis Date  . Adenomatous colon polyp   . Allergy   . Alzheimer disease   . Anemia   . Bipolar affective ([place])   . Blood transfusion without reported diagnosis   . Cataract   . Diabetes mellitus   . GERD (gastroesophageal reflux disease)   . Hyperlipidemia   . Hypertension   . Hypothyroidism   . Immune deficiency disorder ([place])   . Internal hemorrhoids   . Seizures ([place])    Past Surgical History:  Procedure Laterality Date  . ABDOMINAL HYSTERECTOMY    . TONSILECTOMY, ADENOIDECTOMY, BILATERAL MYRINGOTOMY AND TUBES     Family History  Problem Relation Age of Onset  . Diabetes Mother   . Stomach cancer Sister   . Colon cancer      grandmother   Social History  Substance Use Topics  . Smoking status: Former Smoker    Quit date: 10/13/2011  . Smokeless tobacco: Never Used  . Alcohol use No   OB History    No data available     Review of Systems  Constitutional: Negative.   Respiratory: Negative.   Musculoskeletal:       Pain and tenderness right 4th toe base and distal forefoot.  Skin: Negative.   Neurological: Negative.   All other systems reviewed and are negative.   Allergies  Lactose intolerance (gi); Vicodin [hydrocodone-acetaminophen]; and Neurontin [gabapentin]  Home Medications   Prior to Admission  medications   Medication Sig Start Date End Date Taking? Authorizing Provider  ACCU-CHEK FASTCLIX LANCETS MISC 1 each by Does not apply route daily before breakfast. 05/23/15   Mariel Aloe, MD  aspirin 81 MG tablet Take 81 mg by mouth daily.    Historical Provider, MD  aspirin EC 81 MG tablet Take 81 mg by mouth daily. 10/14/11 11/01/13  Waldemar Dickens, MD  atorvastatin (LIPITOR) 40 MG tablet Take 1 tablet (40 mg total) by mouth daily. 10/04/13   Waldemar Dickens, MD  baclofen (LIORESAL) 10 MG tablet Take 1 tablet (10 mg total) by mouth 3 (three) times daily as needed for muscle spasms. Patient not taking: Reported on 08/05/2015 12/27/13   Waldemar Dickens, MD  Blood Glucose Monitoring Suppl (ACCU-CHEK AVIVA PLUS) W/DEVICE KIT Please check your blood sugar once per day when you wake up. 05/23/15   Mariel Aloe, MD  cephALEXin (KEFLEX) 500 MG capsule Take 1 capsule (500 mg total) by mouth 2 (two) times daily. Patient not taking: Reported on 08/05/2015 12/17/14   Mariel Aloe, MD  cetirizine (ZYRTEC) 10 MG tablet Take 1 tablet (10 mg total) by mouth daily. Patient not taking: Reported on 08/05/2015 05/14/14   Mariel Aloe, MD  clotrimazole-betamethasone (LOTRISONE) cream Apply to affected area 2 times daily prn Patient not taking: Reported on 08/05/2015 12/20/14   Janne Napoleon,  NP  fluconazole (DIFLUCAN) 150 MG tablet 1 tab po x 1. May repeat in 72 hours if no improvement Patient not taking: Reported on 08/05/2015 12/20/14   Janne Napoleon, NP  glipiZIDE (GLUCOTROL) 5 MG tablet Take 1 tablet (5 mg total) by mouth daily before breakfast. Patient not taking: Reported on 08/05/2015 03/19/15   Mariel Aloe, MD  glucose blood test strip 1 each by Other route daily before breakfast. 05/23/15   Mariel Aloe, MD  levothyroxine (SYNTHROID, LEVOTHROID) 175 MCG tablet Take 1 tablet (175 mcg total) by mouth daily. Patient not taking: Reported on 08/05/2015 10/04/13   Waldemar Dickens, MD  levothyroxine (SYNTHROID, LEVOTHROID)  175 MCG tablet Take 1 tablet (175 mcg total) by mouth daily before breakfast. 01/07/16   Konrad Felix, PA  meloxicam (MOBIC) 7.5 MG tablet Take 1-2 tablets (7.5-15 mg total) by mouth daily. Patient not taking: Reported on 08/05/2015 07/28/13   Waldemar Dickens, MD  metoprolol (TOPROL-XL) 200 MG 24 hr tablet Take 1 tablet (200 mg total) by mouth daily. 03/17/16   Mariel Aloe, MD  metroNIDAZOLE (FLAGYL) 500 MG tablet Take 1 tablet (500 mg total) by mouth 2 (two) times daily. X 7 days Patient not taking: Reported on 08/05/2015 12/24/14   Janne Napoleon, NP  pantoprazole (PROTONIX) 40 MG tablet Take 1 tablet (40 mg total) by mouth daily. Patient not taking: Reported on 08/05/2015 06/19/14   Mariel Aloe, MD  phenazopyridine (PYRIDIUM) 100 MG tablet Take 1 tablet (100 mg total) by mouth 3 (three) times daily as needed for pain. Patient not taking: Reported on 08/05/2015 03/21/14   Waldemar Dickens, MD  polyethylene glycol powder (GLYCOLAX/MIRALAX) powder Take 17 g by mouth daily. Patient not taking: Reported on 08/05/2015 12/17/14   Mariel Aloe, MD  potassium chloride SA (K-DUR,KLOR-CON) 20 MEQ tablet Take 1 tablet (20 mEq total) by mouth daily. Patient not taking: Reported on 08/05/2015 10/20/13   Waldemar Dickens, MD  pregabalin (LYRICA) 25 MG capsule Take 1 capsule (25 mg total) by mouth 3 (three) times daily. Patient not taking: Reported on 08/05/2015 11/15/14   Mariel Aloe, MD  senna (SENOKOT) 8.6 MG tablet Take 2 tablets (17.2 mg total) by mouth 2 (two) times daily. Once achieve daily BM decrease to one tablet daily Patient not taking: Reported on 08/05/2015 10/24/13   Waldemar Dickens, MD  sodium phosphate (FLEET) enema Place 1 enema rectally once. follow package directions Patient not taking: Reported on 08/05/2015 10/24/13   Waldemar Dickens, MD  triamcinolone (NASACORT AQ) 55 MCG/ACT AERO nasal inhaler Place 2 sprays into the nose daily. Patient not taking: Reported on 08/05/2015 05/14/14   Mariel Aloe,  MD  valsartan (DIOVAN) 320 MG tablet Take 1 tablet (320 mg total) by mouth daily. 01/07/16   Konrad Felix, PA  vitamin B-12 (CYANOCOBALAMIN) 1000 MCG tablet Take 1,000 mcg by mouth daily.    Historical Provider, MD   Meds Ordered and Administered this Visit  Medications - No data to display  BP 175/76 (BP Location: Right Arm)   Pulse 80   Temp 97.2 F (36.2 C) (Oral)   Resp 16   SpO2 99%  No data found.   Physical Exam  Constitutional: She is oriented to person, place, and time. She appears well-developed and well-nourished. No distress.  HENT:  Head: Normocephalic and atraumatic.  Eyes: EOM are normal.  Neck: Neck supple.  Cardiovascular: Normal rate.   Pulmonary/Chest: Effort normal.  Musculoskeletal: Normal range of motion.  Tenderness at the base of the right fourth toe. No tenderness to the digit itself. No discoloration or swelling. No deformity. There is tenderness to the dorsal aspect of the distal forefoot.  Neurological: She is alert and oriented to person, place, and time. No cranial nerve deficit.  Skin: Skin is warm and dry.  Psychiatric: She has a normal mood and affect.  Nursing note and vitals reviewed.   Urgent Care Course   Clinical Course    Procedures (including critical care time)  Labs Review Labs Reviewed - No data to display  Imaging Review Dg Foot Complete Right  Result Date: 05/29/2016 CLINICAL DATA:  Right foot pain/injury EXAM: RIGHT FOOT COMPLETE - 3+ VIEW COMPARISON:  None. FINDINGS: Mild cortical irregularity involving the base of the 3rd proximal phalanx on the oblique view, equivocal. Minimally displaced fracture involving the shaft of the 4th proximal phalanx on the lateral view. Mild cortical irregularity involving the midshaft of the 5th proximal phalanx on the AP view. Mild soft tissue swelling involving the forefoot. IMPRESSION: Minimally displaced fracture involving the shaft of the 4th proximal phalanx. Suspected nondisplaced  fracture involving the shaft of the 5th proximal phalanx. Mild cortical irregularity involving the base of the 3rd proximal phalanx, equivocal. Correlate for point tenderness to exclude nondisplaced fracture. Electronically Signed   By: Julian Hy M.D.   On: 05/29/2016 17:12     Visual Acuity Review  Right Eye Distance:   Left Eye Distance:   Bilateral Distance:    Right Eye Near:   Left Eye Near:    Bilateral Near:         MDM   1. Toe fracture, right, closed, initial encounter    Toe fractures right 4 and 5th, possible 3rd.  Rest foot, elevate and apply ice. May need to wear the hard sole shoe for 2 to 3 weeks for healing. Keep toes buddy taped for about 2 weeks. Follow with your PCP as needed, may return prn      Janne Napoleon, NP 05/29/16 1738

## 2016-05-29 NOTE — Discharge Instructions (Signed)
Rest foot, elevate and apply ice. May need to wear the hard sole shoe for 2 to 3 weeks for healing. Keep toes buddy taped for bout 2 weeks.

## 2016-06-05 ENCOUNTER — Telehealth: Payer: Self-pay | Admitting: Internal Medicine

## 2016-06-05 NOTE — Telephone Encounter (Signed)
Daughter called and would like to speak to Dr. Juleen China about her mother. She is concerned about her mother since she is very forgetful. She would like to know what else she can do and what steps can be taken to get her mother tested for dementia. jw

## 2016-06-08 NOTE — Telephone Encounter (Signed)
Please call daughter back and ask her to make an appointment with her mother so that we can discuss this further.   Phill Myron, D.O. 06/08/2016, 3:42 PM PGY-2, [place]

## 2016-06-09 NOTE — Telephone Encounter (Signed)
Called daughter, LVM for her to call the office. If she calls, please make an appt for Kristine Boyer to see Dr. Juleen China. Ottis Stain, CMA

## 2016-06-16 NOTE — Telephone Encounter (Signed)
Spoke with daughter and informed of the below.  She will bring her in on Monday (06/22/16) but request that MD speak with her w/o her mom present because her mom will deny everything. .. Kristine Boyer, [place], [place]

## 2016-06-18 ENCOUNTER — Encounter (HOSPITAL_COMMUNITY): Payer: Self-pay

## 2016-06-18 ENCOUNTER — Observation Stay (HOSPITAL_COMMUNITY)
Admission: EM | Admit: 2016-06-18 | Discharge: 2016-06-20 | Disposition: A | Discharge status: Home / Self Care | Payer: Commercial Managed Care - HMO | Attending: Family Medicine | Admitting: Family Medicine

## 2016-06-18 DIAGNOSIS — Z87891 Personal history of nicotine dependence: Secondary | ICD-10-CM | POA: Insufficient documentation

## 2016-06-18 DIAGNOSIS — R1310 Dysphagia, unspecified: Secondary | ICD-10-CM

## 2016-06-18 DIAGNOSIS — I10 Essential (primary) hypertension: Secondary | ICD-10-CM | POA: Diagnosis not present

## 2016-06-18 DIAGNOSIS — E039 Hypothyroidism, unspecified: Secondary | ICD-10-CM | POA: Diagnosis present

## 2016-06-18 DIAGNOSIS — Z7984 Long term (current) use of oral hypoglycemic drugs: Secondary | ICD-10-CM | POA: Insufficient documentation

## 2016-06-18 DIAGNOSIS — G309 Alzheimer's disease, unspecified: Secondary | ICD-10-CM | POA: Insufficient documentation

## 2016-06-18 DIAGNOSIS — E119 Type 2 diabetes mellitus without complications: Secondary | ICD-10-CM | POA: Insufficient documentation

## 2016-06-18 DIAGNOSIS — H8110 Benign paroxysmal vertigo, unspecified ear: Secondary | ICD-10-CM | POA: Diagnosis present

## 2016-06-18 DIAGNOSIS — IMO0002 Reserved for concepts with insufficient information to code with codable children: Secondary | ICD-10-CM | POA: Diagnosis present

## 2016-06-18 DIAGNOSIS — I480 Paroxysmal atrial fibrillation: Secondary | ICD-10-CM | POA: Diagnosis not present

## 2016-06-18 DIAGNOSIS — R079 Chest pain, unspecified: Secondary | ICD-10-CM | POA: Diagnosis not present

## 2016-06-18 DIAGNOSIS — I4891 Unspecified atrial fibrillation: Secondary | ICD-10-CM | POA: Diagnosis present

## 2016-06-18 DIAGNOSIS — K5900 Constipation, unspecified: Secondary | ICD-10-CM | POA: Diagnosis present

## 2016-06-18 DIAGNOSIS — E669 Obesity, unspecified: Secondary | ICD-10-CM

## 2016-06-18 DIAGNOSIS — Z7982 Long term (current) use of aspirin: Secondary | ICD-10-CM | POA: Diagnosis not present

## 2016-06-18 DIAGNOSIS — H8111 Benign paroxysmal vertigo, right ear: Secondary | ICD-10-CM

## 2016-06-18 DIAGNOSIS — E876 Hypokalemia: Secondary | ICD-10-CM | POA: Diagnosis present

## 2016-06-18 DIAGNOSIS — E785 Hyperlipidemia, unspecified: Secondary | ICD-10-CM | POA: Diagnosis present

## 2016-06-18 DIAGNOSIS — R0789 Other chest pain: Secondary | ICD-10-CM | POA: Diagnosis not present

## 2016-06-18 DIAGNOSIS — Z79899 Other long term (current) drug therapy: Secondary | ICD-10-CM | POA: Diagnosis not present

## 2016-06-18 DIAGNOSIS — I481 Persistent atrial fibrillation: Secondary | ICD-10-CM | POA: Diagnosis not present

## 2016-06-18 DIAGNOSIS — R42 Dizziness and giddiness: Secondary | ICD-10-CM | POA: Diagnosis present

## 2016-06-18 DIAGNOSIS — E1165 Type 2 diabetes mellitus with hyperglycemia: Secondary | ICD-10-CM | POA: Diagnosis present

## 2016-06-18 HISTORY — DX: Chest pain, unspecified: R07.9

## 2016-06-18 LAB — CBC
HEMATOCRIT: 34.4 % — AB (ref 36.0–46.0)
Hemoglobin: 11.1 g/dL — ABNORMAL LOW (ref 12.0–15.0)
MCH: 26.4 pg (ref 26.0–34.0)
MCHC: 32.3 g/dL (ref 30.0–36.0)
MCV: 81.9 fL (ref 78.0–100.0)
PLATELETS: 185 10*3/uL (ref 150–400)
RBC: 4.2 MIL/uL (ref 3.87–5.11)
RDW: 13.3 % (ref 11.5–15.5)
WBC: 8 10*3/uL (ref 4.0–10.5)

## 2016-06-18 LAB — BASIC METABOLIC PANEL
Anion gap: 8 (ref 5–15)
BUN: 12 mg/dL (ref 6–20)
CHLORIDE: 103 mmol/L (ref 101–111)
CO2: 28 mmol/L (ref 22–32)
CREATININE: 1.18 mg/dL — AB (ref 0.44–1.00)
Calcium: 9.1 mg/dL (ref 8.9–10.3)
GFR, EST AFRICAN AMERICAN: 51 mL/min — AB (ref >60)
GFR, EST NON AFRICAN AMERICAN: 44 mL/min — AB (ref >60)
Glucose, Bld: 239 mg/dL — ABNORMAL HIGH (ref 65–99)
POTASSIUM: 3.4 mmol/L — AB (ref 3.5–5.1)
SODIUM: 139 mmol/L (ref 135–145)

## 2016-06-18 LAB — I-STAT TROPONIN, ED: TROPONIN I, POC: 0.01 ng/mL (ref 0.00–0.08)

## 2016-06-18 LAB — URINALYSIS, ROUTINE W REFLEX MICROSCOPIC
Bilirubin Urine: NEGATIVE
Glucose, UA: 250 mg/dL — AB
Hgb urine dipstick: NEGATIVE
KETONES UR: NEGATIVE mg/dL
NITRITE: NEGATIVE
PROTEIN: NEGATIVE mg/dL
Specific Gravity, Urine: 1.007 (ref 1.005–1.030)
pH: 6.5 (ref 5.0–8.0)

## 2016-06-18 LAB — TROPONIN I: TROPONIN I: 0.03 ng/mL — AB (ref <0.03)

## 2016-06-18 LAB — URINE MICROSCOPIC-ADD ON: RBC / HPF: NONE SEEN RBC/hpf (ref 0–5)

## 2016-06-18 LAB — MAGNESIUM: MAGNESIUM: 1.9 mg/dL (ref 1.7–2.4)

## 2016-06-18 LAB — GLUCOSE, CAPILLARY: GLUCOSE-CAPILLARY: 143 mg/dL — AB (ref 65–99)

## 2016-06-18 LAB — T4, FREE: FREE T4: 0.76 ng/dL (ref 0.61–1.12)

## 2016-06-18 LAB — CBG MONITORING, ED: GLUCOSE-CAPILLARY: 199 mg/dL — AB (ref 65–99)

## 2016-06-18 LAB — TSH: TSH: 6.576 u[IU]/mL — ABNORMAL HIGH (ref 0.350–4.500)

## 2016-06-18 MED ORDER — CLOTRIMAZOLE 1 % EX CREA
TOPICAL_CREAM | Freq: Two times a day (BID) | CUTANEOUS | Status: DC
  Administered 2016-06-18 – 2016-06-20 (×4): via TOPICAL
  Filled 2016-06-18: qty 15

## 2016-06-18 MED ORDER — LEVOTHYROXINE SODIUM 75 MCG PO TABS
175.0000 ug | ORAL_TABLET | Freq: Every day | ORAL | Status: DC
  Administered 2016-06-19 – 2016-06-20 (×2): 175 ug via ORAL
  Filled 2016-06-18 (×3): qty 1

## 2016-06-18 MED ORDER — MECLIZINE HCL 25 MG PO TABS
25.0000 mg | ORAL_TABLET | Freq: Three times a day (TID) | ORAL | Status: DC | PRN
  Administered 2016-06-19: 25 mg via ORAL
  Filled 2016-06-18: qty 1

## 2016-06-18 MED ORDER — POTASSIUM CHLORIDE CRYS ER 20 MEQ PO TBCR: 20.0000 meq | EXTENDED_RELEASE_TABLET | Freq: Every day | ORAL | Status: DC

## 2016-06-18 MED ORDER — GLIPIZIDE 5 MG PO TABS
5.0000 mg | ORAL_TABLET | Freq: Every day | ORAL | Status: DC
  Administered 2016-06-19 – 2016-06-20 (×2): 5 mg via ORAL
  Filled 2016-06-18 (×3): qty 1

## 2016-06-18 MED ORDER — ACETAMINOPHEN 325 MG PO TABS
650.0000 mg | ORAL_TABLET | Freq: Four times a day (QID) | ORAL | Status: DC | PRN
  Administered 2016-06-19: 650 mg via ORAL
  Filled 2016-06-18: qty 2

## 2016-06-18 MED ORDER — APIXABAN 5 MG PO TABS
5.0000 mg | ORAL_TABLET | Freq: Two times a day (BID) | ORAL | Status: DC
  Administered 2016-06-18 – 2016-06-20 (×4): 5 mg via ORAL
  Filled 2016-06-18 (×5): qty 1

## 2016-06-18 MED ORDER — METOPROLOL SUCCINATE ER 100 MG PO TB24
200.0000 mg | ORAL_TABLET | Freq: Every day | ORAL | Status: DC
  Administered 2016-06-19 – 2016-06-20 (×2): 200 mg via ORAL
  Filled 2016-06-18 (×2): qty 2

## 2016-06-18 MED ORDER — POTASSIUM CHLORIDE CRYS ER 20 MEQ PO TBCR
20.0000 meq | EXTENDED_RELEASE_TABLET | Freq: Every day | ORAL | Status: DC
  Administered 2016-06-19: 20 meq via ORAL
  Filled 2016-06-18: qty 1

## 2016-06-18 MED ORDER — SODIUM CHLORIDE 0.9% FLUSH
3.0000 mL | Freq: Two times a day (BID) | INTRAVENOUS | Status: DC
  Administered 2016-06-18 – 2016-06-19 (×3): 3 mL via INTRAVENOUS

## 2016-06-18 MED ORDER — ONDANSETRON HCL 4 MG/2ML IJ SOLN: 4.0000 mg | Freq: Four times a day (QID) | INTRAMUSCULAR | Status: DC | PRN

## 2016-06-18 MED ORDER — IRBESARTAN 150 MG PO TABS
150.0000 mg | ORAL_TABLET | Freq: Every day | ORAL | Status: DC
  Administered 2016-06-19 – 2016-06-20 (×2): 150 mg via ORAL
  Filled 2016-06-18 (×2): qty 1

## 2016-06-18 MED ORDER — POLYETHYLENE GLYCOL 3350 17 GM/SCOOP PO POWD
17.0000 g | Freq: Every day | ORAL | Status: DC
  Filled 2016-06-18: qty 255

## 2016-06-18 MED ORDER — ONDANSETRON HCL 4 MG PO TABS
4.0000 mg | ORAL_TABLET | Freq: Four times a day (QID) | ORAL | Status: DC | PRN
  Administered 2016-06-19: 4 mg via ORAL
  Filled 2016-06-18: qty 1

## 2016-06-18 MED ORDER — SENNA 8.6 MG PO TABS
2.0000 | ORAL_TABLET | Freq: Two times a day (BID) | ORAL | Status: DC
  Administered 2016-06-18: 17.2 mg via ORAL
  Administered 2016-06-19: 8.6 mg via ORAL
  Filled 2016-06-18 (×6): qty 2

## 2016-06-18 MED ORDER — SODIUM CHLORIDE 0.9 % IV BOLUS (SEPSIS)
1000.0000 mL | Freq: Once | INTRAVENOUS | Status: AC
  Administered 2016-06-18: 1000 mL via INTRAVENOUS

## 2016-06-18 MED ORDER — MAGNESIUM SULFATE 2 GM/50ML IV SOLN
2.0000 g | Freq: Once | INTRAVENOUS | Status: AC
  Administered 2016-06-19: 2 g via INTRAVENOUS
  Filled 2016-06-18: qty 50

## 2016-06-18 MED ORDER — PANTOPRAZOLE SODIUM 40 MG PO TBEC
40.0000 mg | DELAYED_RELEASE_TABLET | Freq: Every day | ORAL | Status: DC
  Administered 2016-06-19 – 2016-06-20 (×2): 40 mg via ORAL
  Filled 2016-06-18 (×2): qty 1

## 2016-06-18 MED ORDER — POLYETHYLENE GLYCOL 3350 17 G PO PACK
17.0000 g | PACK | Freq: Every day | ORAL | Status: DC
  Filled 2016-06-18 (×2): qty 1

## 2016-06-18 NOTE — H&P (Signed)
History and Physical    Kristine Boyer QAS:[id number] DOB: Sep 01, 1941 DOA: 06/18/2016  PCP: Melina Schools, DO   Patient coming from: Home.  Chief Complaint: Vertigo.  HPI: Kristine Boyer is a 75 y.o. female with medical history significant of colon polyps, allergies, Alzheimer's disease, anemia, bipolar affective disorder, cataracts, type 2 diabetes, GERD, hyperlipidemia, hypertension, hypothyroidism, knee and deficiency disorder, internal hemorrhoids, seizure disorder was brought to the emergency department today by her daughter due to dizziness and vertigo since this morning. Symptoms are worsened by shifting positions or ambulating.  She also states that earlier today she also had precordial chest pain, pressure-like that radiates to her neck without dyspnea, palpitations, diaphoresis, nausea or emesis. She denies PND, orthopnea or recent pitting edema of the lower extremities.  ED Course: She was given a normal saline 1 L bolus and started on anticoagulation. EKG shows atrial fibrillation. Troponin level was negative. Potassium level was 3.4 mmol/ML.  Review of Systems: As per HPI otherwise 10 point review of systems negative.    Past Medical History:  Diagnosis Date  . Adenomatous colon polyp   . Allergy   . Alzheimer disease   . Anemia   . Bipolar affective ([place])   . Blood transfusion without reported diagnosis   . Cataract   . Diabetes mellitus   . GERD (gastroesophageal reflux disease)   . Hyperlipidemia   . Hypertension   . Hypothyroidism   . Immune deficiency disorder ([place])   . Internal hemorrhoids   . Seizures ([place])     Past Surgical History:  Procedure Laterality Date  . ABDOMINAL HYSTERECTOMY    . TONSILECTOMY, ADENOIDECTOMY, BILATERAL MYRINGOTOMY AND TUBES       reports that she quit smoking about 4 years ago. She has never used smokeless tobacco. She reports that she does not drink alcohol or use drugs.  Allergies  Allergen Reactions  . Lactose  Intolerance (Gi) Other (See Comments)    constipation  . Vicodin [Hydrocodone-Acetaminophen] Itching  . Neurontin [Gabapentin] Other (See Comments)    Drowsy. Makes her feel "funny"    Family History  Problem Relation Age of Onset  . Diabetes Mother   . Stomach cancer Sister   . Colon cancer      grandmother    Prior to Admission medications   Medication Sig Start Date End Date Taking? Authorizing Provider  metoprolol (TOPROL-XL) 200 MG 24 hr tablet Take 1 tablet (200 mg total) by mouth daily. 03/17/16  Yes Mariel Aloe, MD  ACCU-CHEK FASTCLIX LANCETS MISC 1 each by Does not apply route daily before breakfast. 05/23/15   Mariel Aloe, MD  aspirin EC 81 MG tablet Take 81 mg by mouth daily. 10/14/11 11/01/13  Waldemar Dickens, MD  atorvastatin (LIPITOR) 40 MG tablet Take 1 tablet (40 mg total) by mouth daily. Patient not taking: Reported on 06/18/2016 10/04/13   Waldemar Dickens, MD  baclofen (LIORESAL) 10 MG tablet Take 1 tablet (10 mg total) by mouth 3 (three) times daily as needed for muscle spasms. Patient not taking: Reported on 06/18/2016 12/27/13   Waldemar Dickens, MD  Blood Glucose Monitoring Suppl (ACCU-CHEK AVIVA PLUS) W/DEVICE KIT Please check your blood sugar once per day when you wake up. 05/23/15   Mariel Aloe, MD  cephALEXin (KEFLEX) 500 MG capsule Take 1 capsule (500 mg total) by mouth 2 (two) times daily. Patient not taking: Reported on 06/18/2016 12/17/14   Mariel Aloe, MD  cetirizine Alethia Berthold)  10 MG tablet Take 1 tablet (10 mg total) by mouth daily. Patient not taking: Reported on 06/18/2016 05/14/14   Mariel Aloe, MD  clotrimazole-betamethasone (LOTRISONE) cream Apply to affected area 2 times daily prn Patient not taking: Reported on 06/18/2016 12/20/14   Janne Napoleon, NP  fluconazole (DIFLUCAN) 150 MG tablet 1 tab po x 1. May repeat in 72 hours if no improvement Patient not taking: Reported on 06/18/2016 12/20/14   Janne Napoleon, NP  glipiZIDE (GLUCOTROL) 5 MG tablet Take 1  tablet (5 mg total) by mouth daily before breakfast. Patient not taking: Reported on 06/18/2016 03/19/15   Mariel Aloe, MD  glucose blood test strip 1 each by Other route daily before breakfast. 05/23/15   Mariel Aloe, MD  levothyroxine (SYNTHROID, LEVOTHROID) 175 MCG tablet Take 1 tablet (175 mcg total) by mouth daily. Patient not taking: Reported on 06/18/2016 10/04/13   Waldemar Dickens, MD  levothyroxine (SYNTHROID, LEVOTHROID) 175 MCG tablet Take 1 tablet (175 mcg total) by mouth daily before breakfast. Patient not taking: Reported on 06/18/2016 01/07/16   Konrad Felix, PA  meloxicam (MOBIC) 7.5 MG tablet Take 1-2 tablets (7.5-15 mg total) by mouth daily. Patient not taking: Reported on 06/18/2016 07/28/13   Waldemar Dickens, MD  metroNIDAZOLE (FLAGYL) 500 MG tablet Take 1 tablet (500 mg total) by mouth 2 (two) times daily. X 7 days Patient not taking: Reported on 06/18/2016 12/24/14   Janne Napoleon, NP  pantoprazole (PROTONIX) 40 MG tablet Take 1 tablet (40 mg total) by mouth daily. Patient not taking: Reported on 06/18/2016 06/19/14   Mariel Aloe, MD  phenazopyridine (PYRIDIUM) 100 MG tablet Take 1 tablet (100 mg total) by mouth 3 (three) times daily as needed for pain. Patient not taking: Reported on 06/18/2016 03/21/14   Waldemar Dickens, MD  polyethylene glycol powder (GLYCOLAX/MIRALAX) powder Take 17 g by mouth daily. Patient not taking: Reported on 06/18/2016 12/17/14   Mariel Aloe, MD  potassium chloride SA (K-DUR,KLOR-CON) 20 MEQ tablet Take 1 tablet (20 mEq total) by mouth daily. Patient not taking: Reported on 06/18/2016 10/20/13   Waldemar Dickens, MD  pregabalin (LYRICA) 25 MG capsule Take 1 capsule (25 mg total) by mouth 3 (three) times daily. Patient not taking: Reported on 06/18/2016 11/15/14   Mariel Aloe, MD  senna (SENOKOT) 8.6 MG tablet Take 2 tablets (17.2 mg total) by mouth 2 (two) times daily. Once achieve daily BM decrease to one tablet daily Patient not taking: Reported on  06/18/2016 10/24/13   Waldemar Dickens, MD  sodium phosphate (FLEET) enema Place 1 enema rectally once. follow package directions Patient not taking: Reported on 06/18/2016 10/24/13   Waldemar Dickens, MD  triamcinolone (NASACORT AQ) 55 MCG/ACT AERO nasal inhaler Place 2 sprays into the nose daily. Patient not taking: Reported on 06/18/2016 05/14/14   Mariel Aloe, MD  valsartan (DIOVAN) 320 MG tablet Take 1 tablet (320 mg total) by mouth daily. Patient not taking: Reported on 06/18/2016 01/07/16   Konrad Felix, PA    Physical Exam:  Constitutional: NAD, calm, comfortable Vitals:   06/18/16 1715 06/18/16 1745 06/18/16 1836 06/18/16 1915  BP: (!) 192/133 191/90 93/76 (!) 171/105  Pulse: 79 79 99 84  Resp: 15 15 21 13   Temp:      TempSrc:      SpO2: 100% 99% 98% 100%   Eyes: PERRL, lids and conjunctivae normal ENMT: Mucous membranes are moist. Posterior pharynx shows  visible protruding bone spur. Dentures present.  Neck: normal, supple, no masses, no thyromegaly Respiratory: clear to auscultation bilaterally, no wheezing, no crackles. Normal respiratory effort. No accessory muscle use.  Cardiovascular: Irregularly irregular,  No rubs / gallops. No extremity edema. 2+ pedal pulses. No carotid bruits.  Abdomen: Bowel sounds positive. Soft, no tenderness, no masses palpated. No hepatosplenomegaly.  Musculoskeletal: no clubbing / cyanosis. Good ROM, no contractures. Normal muscle tone.  Skin: Hyprepigmented, 3x2 cm scaly plaque on occipital cervical area.  Neurologic: CN 2-12 grossly intact. Sensation intact, DTR normal. Strength 5/5 in all 4.  Psychiatric: Alert, oriented 2, partially oriented to time and situation. Often confused about events and corrected by her daughter and niece.     Labs on Admission: I have personally reviewed following labs and imaging studies  CBC:  Recent Labs Lab 06/18/16 1431  WBC 8.0  HGB 11.1*  HCT 34.4*  MCV 81.9  PLT 379   Basic Metabolic  Panel:  Recent Labs Lab 06/18/16 1431  NA 139  K 3.4*  CL 103  CO2 28  GLUCOSE 239*  BUN 12  CREATININE 1.18*  CALCIUM 9.1   GFR: CrCl cannot be calculated (Unknown ideal weight.). Liver Function Tests: No results for input(s): AST, ALT, ALKPHOS, BILITOT, PROT, ALBUMIN in the last 168 hours. No results for input(s): LIPASE, AMYLASE in the last 168 hours. No results for input(s): AMMONIA in the last 168 hours. Coagulation Profile: No results for input(s): INR, PROTIME in the last 168 hours. Cardiac Enzymes: No results for input(s): CKTOTAL, CKMB, CKMBINDEX, TROPONINI in the last 168 hours. BNP (last 3 results) No results for input(s): PROBNP in the last 8760 hours. HbA1C: No results for input(s): HGBA1C in the last 72 hours. CBG:  Recent Labs Lab 06/18/16 1630  GLUCAP 199*   Lipid Profile: No results for input(s): CHOL, HDL, LDLCALC, TRIG, CHOLHDL, LDLDIRECT in the last 72 hours. Thyroid Function Tests:  Recent Labs  06/18/16 1645  TSH 6.576*  FREET4 0.76   Anemia Panel: No results for input(s): VITAMINB12, FOLATE, FERRITIN, TIBC, IRON, RETICCTPCT in the last 72 hours. Urine analysis:    Component Value Date/Time   COLORURINE YELLOW 06/18/2016 1718   APPEARANCEUR HAZY (A) 06/18/2016 1718   LABSPEC 1.007 06/18/2016 1718   PHURINE 6.5 06/18/2016 1718   GLUCOSEU 250 (A) 06/18/2016 1718   HGBUR NEGATIVE 06/18/2016 1718   BILIRUBINUR NEGATIVE 06/18/2016 1718   BILIRUBINUR NEG 03/19/2015 1441   KETONESUR NEGATIVE 06/18/2016 1718   PROTEINUR NEGATIVE 06/18/2016 1718   UROBILINOGEN 0.2 03/19/2015 1441   UROBILINOGEN 0.2 12/20/2014 1636   NITRITE NEGATIVE 06/18/2016 1718   LEUKOCYTESUR SMALL (A) 06/18/2016 1718    Radiological Exams on Admission: No results found.  EKG: Independently reviewed. Vent. rate 90 BPM PR interval * ms QRS duration 92 ms QT/QTc 350/428 ms P-R-T axes * -1 106 Atrial fibrillation ST & T wave abnormality, consider lateral  ischemia Abnormal ECG No acute changes TWI in the lateral leads  Assessment/Plan Principal Problem:   Chest pain Admit to observation/telemetry Supplemental oxygen as needed. Trend troponin levels. Check EKG in a.m. Check echocardiogram in the morning. Continue apixaban and metoprolol.  Active Problems:   BPPV (benign paroxysmal positional vertigo) The patient has had meclizine in the past with improvement. Meclizine 25 mg by mouth 3 times a day when necessary.    Hypothyroidism Resume levothyroxine. Monitor TSH periodically.    Diabetes mellitus type 2, uncontrolled (HCC) Carbohydrate modified diet. Resume glipizide.  Hyperlipidemia Not on atorvastatin. Follow-up as an outpatient.    HYPERTENSION, BENIGN SYSTEMIC Resume metoprolol, 200 mg by mouth daily. Resume valsartan 320 mg by mouth daily or generic equivalent.    Hypokalemia Replaced. Follow-up potassium level.    Dysphagia Secondary to pressure from inoperable, per patient's daughter, C-spine bone spur. Dysphagia diet.    CN (constipation) Continue daily MiraLAX as needed. Continue Senokot 1 tab by mouth twice a day.    Atrial fibrillation (HCC) CHA2DS2-VASc Score of at least 4. Continue anticoagulation started in ED. Resume beta blocker.    DVT prophylaxis: On Eliquis Code Status: Full code. Family Communication: Her daughter Lurline Hare was  Disposition Plan: Admit for overnight observation and further evaluation. Consults called:  Admission status: Observation/telemetry.   Reubin Milan MD [facility] Pager [phone].  If 7PM-7AM, please contact night-coverage [email or web address] Password [email or web address]  06/18/2016, 8:59 PM

## 2016-06-18 NOTE — ED Notes (Signed)
MD at bedside. 

## 2016-06-18 NOTE — ED Notes (Signed)
Bedside commode placed at bedside for pt comfort.  Pt given warm blankets, repositioned in bed.

## 2016-06-18 NOTE — ED Notes (Signed)
Pt taken to restroom.  Given a cup, but patient "forgot" to obtain a sample.  States she will try again shortly.

## 2016-06-18 NOTE — ED Provider Notes (Signed)
[facility] DEPT Provider Note   CSN: [id number] Arrival date & time: 06/18/16  1307     History   Chief Complaint Chief Complaint  Patient presents with  . Dizziness    HPI Kristine Boyer is a 75 y.o. female.  HPI  Patient is a 75 year old female with a comp gated past medical history notable for Alzheimer's, diabetes, hypertension, hypothyroidism who comes in today complaining of lightheadedness and dizziness.  Patient states that she woke this morning with these symptoms.  Patient states that she believed to be vertigo which she states she has a history of.  Patient states that her symptoms did not abate prompting her to come to the ED.  Patient denies alleviating or aggravating factors.  Patient also had some associated chest pain at the time without radiation.  Patient states her chest pain has since resolved.  Patient denies fevers chills nausea vomiting. Past Medical History:  Diagnosis Date  . Adenomatous colon polyp   . Allergy   . Alzheimer disease   . Anemia   . Bipolar affective ([place])   . Blood transfusion without reported diagnosis   . Cataract   . Diabetes mellitus   . GERD (gastroesophageal reflux disease)   . Hyperlipidemia   . Hypertension   . Hypothyroidism   . Immune deficiency disorder ([place])   . Internal hemorrhoids   . Seizures [facility])     Patient Active Problem List   Diagnosis Date Noted  . Atrial fibrillation ([place]) 06/18/2016  . Chest pain 06/18/2016  . Angina pectoris, unspecified ([place]) 08/05/2015  . Scalp pain 01/16/2015  . CN (constipation) 12/17/2014  . Sciatica 10/08/2014  . Stiffness of neck 05/16/2014  . Mass of oropharynx 05/15/2014  . Dysuria 03/21/2014  . Abdominal pain 10/04/2013  . Flank pain 08/28/2013  . Dysphagia 08/28/2013  . Unspecified constipation 07/28/2013  . Forgetfulness 07/18/2013  . Leg swelling 02/14/2013  . Breast nodule 01/03/2013  . Musculoskeletal pain 08/16/2012  . Altered sensation, foot 04/26/2012  .  Lung mass 08/05/2011  . OSA (obstructive sleep apnea) 06/12/2011  . Bulging disc 06/12/2011  . BPPV (benign paroxysmal positional vertigo) 05/18/2011  . Hypokalemia 03/18/2011  . De Quervain's syndrome (tenosynovitis) 02/23/2011  . Foot pain, right 11/11/2010  . BACK PAIN, LUMBAR 11/28/2009  . OBESITY 09/11/2008  . ALLERGIC RHINITIS 01/19/2008  . Hypothyroidism 11/25/2006  . Diabetes mellitus type 2, uncontrolled ([place]) 11/25/2006  . Hyperlipidemia 11/25/2006  . HYPERTENSION, BENIGN SYSTEMIC 11/25/2006  . GASTROESOPHAGEAL REFLUX, NO ESOPHAGITIS 11/25/2006  . OSTEOARTHRITIS, MULTI SITES 11/25/2006    Past Surgical History:  Procedure Laterality Date  . ABDOMINAL HYSTERECTOMY    . TONSILECTOMY, ADENOIDECTOMY, BILATERAL MYRINGOTOMY AND TUBES      OB History    No data available       Home Medications    Prior to Admission medications   Medication Sig Start Date End Date Taking? Authorizing Provider  metoprolol (TOPROL-XL) 200 MG 24 hr tablet Take 1 tablet (200 mg total) by mouth daily. 03/17/16  Yes Mariel Aloe, MD  ACCU-CHEK FASTCLIX LANCETS MISC 1 each by Does not apply route daily before breakfast. 05/23/15   Mariel Aloe, MD  aspirin EC 81 MG tablet Take 81 mg by mouth daily. 10/14/11 11/01/13  Waldemar Dickens, MD  atorvastatin (LIPITOR) 40 MG tablet Take 1 tablet (40 mg total) by mouth daily. Patient not taking: Reported on 06/18/2016 10/04/13   Waldemar Dickens, MD  baclofen (LIORESAL) 10 MG tablet Take  1 tablet (10 mg total) by mouth 3 (three) times daily as needed for muscle spasms. Patient not taking: Reported on 06/18/2016 12/27/13   Waldemar Dickens, MD  Blood Glucose Monitoring Suppl (ACCU-CHEK AVIVA PLUS) W/DEVICE KIT Please check your blood sugar once per day when you wake up. 05/23/15   Mariel Aloe, MD  cephALEXin (KEFLEX) 500 MG capsule Take 1 capsule (500 mg total) by mouth 2 (two) times daily. Patient not taking: Reported on 06/18/2016 12/17/14   Mariel Aloe, MD    cetirizine (ZYRTEC) 10 MG tablet Take 1 tablet (10 mg total) by mouth daily. Patient not taking: Reported on 06/18/2016 05/14/14   Mariel Aloe, MD  clotrimazole-betamethasone (LOTRISONE) cream Apply to affected area 2 times daily prn Patient not taking: Reported on 06/18/2016 12/20/14   Janne Napoleon, NP  fluconazole (DIFLUCAN) 150 MG tablet 1 tab po x 1. May repeat in 72 hours if no improvement Patient not taking: Reported on 06/18/2016 12/20/14   Janne Napoleon, NP  glipiZIDE (GLUCOTROL) 5 MG tablet Take 1 tablet (5 mg total) by mouth daily before breakfast. Patient not taking: Reported on 06/18/2016 03/19/15   Mariel Aloe, MD  glucose blood test strip 1 each by Other route daily before breakfast. 05/23/15   Mariel Aloe, MD  levothyroxine (SYNTHROID, LEVOTHROID) 175 MCG tablet Take 1 tablet (175 mcg total) by mouth daily. Patient not taking: Reported on 06/18/2016 10/04/13   Waldemar Dickens, MD  levothyroxine (SYNTHROID, LEVOTHROID) 175 MCG tablet Take 1 tablet (175 mcg total) by mouth daily before breakfast. Patient not taking: Reported on 06/18/2016 01/07/16   Konrad Felix, PA  meloxicam (MOBIC) 7.5 MG tablet Take 1-2 tablets (7.5-15 mg total) by mouth daily. Patient not taking: Reported on 06/18/2016 07/28/13   Waldemar Dickens, MD  metroNIDAZOLE (FLAGYL) 500 MG tablet Take 1 tablet (500 mg total) by mouth 2 (two) times daily. X 7 days Patient not taking: Reported on 06/18/2016 12/24/14   Janne Napoleon, NP  pantoprazole (PROTONIX) 40 MG tablet Take 1 tablet (40 mg total) by mouth daily. Patient not taking: Reported on 06/18/2016 06/19/14   Mariel Aloe, MD  phenazopyridine (PYRIDIUM) 100 MG tablet Take 1 tablet (100 mg total) by mouth 3 (three) times daily as needed for pain. Patient not taking: Reported on 06/18/2016 03/21/14   Waldemar Dickens, MD  polyethylene glycol powder (GLYCOLAX/MIRALAX) powder Take 17 g by mouth daily. Patient not taking: Reported on 06/18/2016 12/17/14   Mariel Aloe, MD   potassium chloride SA (K-DUR,KLOR-CON) 20 MEQ tablet Take 1 tablet (20 mEq total) by mouth daily. Patient not taking: Reported on 06/18/2016 10/20/13   Waldemar Dickens, MD  pregabalin (LYRICA) 25 MG capsule Take 1 capsule (25 mg total) by mouth 3 (three) times daily. Patient not taking: Reported on 06/18/2016 11/15/14   Mariel Aloe, MD  senna (SENOKOT) 8.6 MG tablet Take 2 tablets (17.2 mg total) by mouth 2 (two) times daily. Once achieve daily BM decrease to one tablet daily Patient not taking: Reported on 06/18/2016 10/24/13   Waldemar Dickens, MD  sodium phosphate (FLEET) enema Place 1 enema rectally once. follow package directions Patient not taking: Reported on 06/18/2016 10/24/13   Waldemar Dickens, MD  triamcinolone (NASACORT AQ) 55 MCG/ACT AERO nasal inhaler Place 2 sprays into the nose daily. Patient not taking: Reported on 06/18/2016 05/14/14   Mariel Aloe, MD  valsartan (DIOVAN) 320 MG tablet Take 1 tablet (320  mg total) by mouth daily. Patient not taking: Reported on 06/18/2016 01/07/16   Konrad Felix, PA    Family History Family History  Problem Relation Age of Onset  . Diabetes Mother   . Stomach cancer Sister   . Colon cancer      grandmother    Social History Social History  Substance Use Topics  . Smoking status: Former Smoker    Quit date: 10/13/2011  . Smokeless tobacco: Never Used  . Alcohol use No     Allergies   Lactose intolerance (gi); Vicodin [hydrocodone-acetaminophen]; and Neurontin [gabapentin]   Review of Systems Review of Systems  Constitutional: Positive for fatigue. Negative for chills, diaphoresis and fever.  Respiratory: Positive for chest tightness and shortness of breath.   Cardiovascular: Positive for chest pain and palpitations. Negative for leg swelling.  Gastrointestinal: Negative for abdominal pain.  Neurological: Positive for dizziness and light-headedness. Negative for seizures, speech difficulty, weakness and numbness.  All other  systems reviewed and are negative.    Physical Exam Updated Vital Signs BP (!) 171/105   Pulse 84   Temp 98.2 F (36.8 C) (Oral)   Resp 13   SpO2 100%   Physical Exam  Constitutional: She appears well-developed and well-nourished. No distress.  HENT:  Head: Normocephalic and atraumatic.  Eyes: Conjunctivae are normal.  Neck: Neck supple.  Cardiovascular: Normal rate and intact distal pulses.   No murmur heard. Afib   Pulmonary/Chest: Effort normal and breath sounds normal. No respiratory distress.  Abdominal: Soft. There is no tenderness.  Musculoskeletal: She exhibits no edema.  Neurological: She is alert.  Skin: Skin is warm and dry.  Psychiatric: She has a normal mood and affect.  Nursing note and vitals reviewed.    ED Treatments / Results  Labs (all labs ordered are listed, but only abnormal results are displayed) Labs Reviewed  BASIC METABOLIC PANEL - Abnormal; Notable for the following:       Result Value   Potassium 3.4 (*)    Glucose, Bld 239 (*)    Creatinine, Ser 1.18 (*)    GFR calc non Af Amer 44 (*)    GFR calc Af Amer 51 (*)    All other components within normal limits  CBC - Abnormal; Notable for the following:    Hemoglobin 11.1 (*)    HCT 34.4 (*)    All other components within normal limits  URINALYSIS, ROUTINE W REFLEX MICROSCOPIC (NOT AT [facility]) - Abnormal; Notable for the following:    APPearance HAZY (*)    Glucose, UA 250 (*)    Leukocytes, UA SMALL (*)    All other components within normal limits  TSH - Abnormal; Notable for the following:    TSH 6.576 (*)    All other components within normal limits  URINE MICROSCOPIC-ADD ON - Abnormal; Notable for the following:    Squamous Epithelial / LPF 0-5 (*)    Bacteria, UA FEW (*)    All other components within normal limits  TROPONIN I - Abnormal; Notable for the following:    Troponin I 0.03 (*)    All other components within normal limits  GLUCOSE, CAPILLARY - Abnormal; Notable for  the following:    Glucose-Capillary 143 (*)    All other components within normal limits  CBG MONITORING, ED - Abnormal; Notable for the following:    Glucose-Capillary 199 (*)    All other components within normal limits  T4, FREE  MAGNESIUM  TROPONIN I  CBC  COMPREHENSIVE METABOLIC PANEL  I-STAT TROPOININ, ED    EKG  EKG Interpretation  Date/Time:  Thursday June 18 2016 13:54:36 EDT Ventricular Rate:  90 PR Interval:    QRS Duration: 92 QT Interval:  350 QTC Calculation: 428 R Axis:   0 Text Interpretation:  Atrial fibrillation ST & T wave abnormality, consider lateral ischemia Abnormal ECG No acute changes TWI in the lateral leads Confirmed by Kathrynn Humble, MD, Thelma Comp [phone]) on 06/18/2016 4:21:14 PM       Radiology No results found.  Procedures Procedures (including critical care time)  Medications Ordered in ED Medications  apixaban (ELIQUIS) tablet 5 mg (5 mg Oral Given 06/18/16 2203)  levothyroxine (SYNTHROID, LEVOTHROID) tablet 175 mcg (not administered)  potassium chloride SA (K-DUR,KLOR-CON) CR tablet 20 mEq (not administered)  senna (SENOKOT) tablet 17.2 mg (17.2 mg Oral Given 06/18/16 2203)  pantoprazole (PROTONIX) EC tablet 40 mg (not administered)  clotrimazole (LOTRIMIN) 1 % cream ( Topical Given 06/18/16 2208)  irbesartan (AVAPRO) tablet 150 mg (not administered)  glipiZIDE (GLUCOTROL) tablet 5 mg (not administered)  metoprolol succinate (TOPROL-XL) 24 hr tablet 200 mg (not administered)  sodium chloride flush (NS) 0.9 % injection 3 mL (3 mLs Intravenous Given 06/18/16 2210)  ondansetron (ZOFRAN) tablet 4 mg (not administered)    Or  ondansetron (ZOFRAN) injection 4 mg (not administered)  acetaminophen (TYLENOL) tablet 650 mg (not administered)  meclizine (ANTIVERT) tablet 25 mg (not administered)  polyethylene glycol (MIRALAX / GLYCOLAX) packet 17 g (not administered)  sodium chloride 0.9 % bolus 1,000 mL (1,000 mLs Intravenous New Bag/Given 06/18/16  1736)     Initial Impression / Assessment and Plan / ED Course  I have reviewed the triage vital signs and the nursing notes.  Pertinent labs & imaging results that were available during my care of the patient were reviewed by me and considered in my medical decision making (see chart for details).  Clinical Course   Patient is a 75 year old female with a comp gated past medical history notable for Alzheimer's, diabetes, hypertension, hypothyroidism who comes in today complaining of lightheadedness and dizziness.  Patient states that she woke this morning with these symptoms.  Patient states that she believed to be vertigo which she states she has a history of.  Patient states that her symptoms did not abate prompting her to come to the ED.  Patient denies alleviating or aggravating factors.  Patient also had some associated chest pain at the time without radiation.  Patient states her chest pain has since resolved.  Patient denies fevers chills nausea vomiting.  Physical exam: Patient clear to auscultation bilaterally heart tones difficult to appreciate, monitor shows A. fib.  Abdomen soft nontender.  Remainder physical exam within normal limits.  Cranial nerves II through XII intact.  Patient's EKG shows A. fib.  Patient has no history of A. fib but states that she has not been taking her other medications recently she ran out.  Patient's laboratory workup also notable for mild AK I.  Initial troponin negative.     On further questioning patient states that she has had episodes like this in the past that have resolved somewhat quickly.  Concerned that this may not be patient's first onset of A. fib and that she has been suffering from paroxysmal A. fib.  As such we will admit patient to the hospitalist for further care and management.     Final Clinical Impressions(s) / ED Diagnoses   Final diagnoses:  Paroxysmal atrial fibrillation (  [facility])    New Prescriptions Current Discharge  Medication List       Chapman Moss, MD 06/18/16 4627    Varney Biles, MD 06/25/16 1455

## 2016-06-18 NOTE — Consult Note (Signed)
Admit date: 06/18/2016 Referring Physician  Dr. Kathrynn Humble Primary Physician Melina Schools, DO Primary Cardiologist  New Reason for Consultation  AFIB  HPI:  75 year old female with new discovered atrial fibrillation. Earlier today she was feeling dizziness, lightheadedness, woke up with the symptoms. She thought that it was vertigo. She has had a history of this in the past. Perhaps she has had chest pain but currently is not having any discomfort. No recent fevers, chills. She does have a history notable for Alzheimer's disease, diabetes, hypertension, hypothyroidism.  She has not had a prior history of atrial fibrillation. She has not been taking any recent medications.  Currently smiling, No SOB. States she had mild CP a few minutes ago. No fevers.   PMH:   Past Medical History:  Diagnosis Date  . Adenomatous colon polyp   . Allergy   . Alzheimer disease   . Anemia   . Bipolar affective ([place])   . Blood transfusion without reported diagnosis   . Cataract   . Diabetes mellitus   . GERD (gastroesophageal reflux disease)   . Hyperlipidemia   . Hypertension   . Hypothyroidism   . Immune deficiency disorder ([place])   . Internal hemorrhoids   . Seizures ([facility])     PSH:   Past Surgical History:  Procedure Laterality Date  . ABDOMINAL HYSTERECTOMY    . TONSILECTOMY, ADENOIDECTOMY, BILATERAL MYRINGOTOMY AND TUBES     Allergies:  Lactose intolerance (gi); Vicodin [hydrocodone-acetaminophen]; and Neurontin [gabapentin] Prior to Admit Meds:   Prior to Admission medications   Medication Sig Start Date End Date Taking? Authorizing Provider  metoprolol (TOPROL-XL) 200 MG 24 hr tablet Take 1 tablet (200 mg total) by mouth daily. 03/17/16  Yes Mariel Aloe, MD  ACCU-CHEK FASTCLIX LANCETS MISC 1 each by Does not apply route daily before breakfast. 05/23/15   Mariel Aloe, MD  aspirin EC 81 MG tablet Take 81 mg by mouth daily. 10/14/11 11/01/13  Waldemar Dickens, MD  atorvastatin  (LIPITOR) 40 MG tablet Take 1 tablet (40 mg total) by mouth daily. Patient not taking: Reported on 06/18/2016 10/04/13   Waldemar Dickens, MD  baclofen (LIORESAL) 10 MG tablet Take 1 tablet (10 mg total) by mouth 3 (three) times daily as needed for muscle spasms. Patient not taking: Reported on 06/18/2016 12/27/13   Waldemar Dickens, MD  Blood Glucose Monitoring Suppl (ACCU-CHEK AVIVA PLUS) W/DEVICE KIT Please check your blood sugar once per day when you wake up. 05/23/15   Mariel Aloe, MD  cephALEXin (KEFLEX) 500 MG capsule Take 1 capsule (500 mg total) by mouth 2 (two) times daily. Patient not taking: Reported on 06/18/2016 12/17/14   Mariel Aloe, MD  cetirizine (ZYRTEC) 10 MG tablet Take 1 tablet (10 mg total) by mouth daily. Patient not taking: Reported on 06/18/2016 05/14/14   Mariel Aloe, MD  clotrimazole-betamethasone (LOTRISONE) cream Apply to affected area 2 times daily prn Patient not taking: Reported on 06/18/2016 12/20/14   Janne Napoleon, NP  fluconazole (DIFLUCAN) 150 MG tablet 1 tab po x 1. May repeat in 72 hours if no improvement Patient not taking: Reported on 06/18/2016 12/20/14   Janne Napoleon, NP  glipiZIDE (GLUCOTROL) 5 MG tablet Take 1 tablet (5 mg total) by mouth daily before breakfast. Patient not taking: Reported on 06/18/2016 03/19/15   Mariel Aloe, MD  glucose blood test strip 1 each by Other route daily before breakfast. 05/23/15   Deidre Ala  A Lonny Prude, MD  levothyroxine (SYNTHROID, LEVOTHROID) 175 MCG tablet Take 1 tablet (175 mcg total) by mouth daily. Patient not taking: Reported on 06/18/2016 10/04/13   Waldemar Dickens, MD  levothyroxine (SYNTHROID, LEVOTHROID) 175 MCG tablet Take 1 tablet (175 mcg total) by mouth daily before breakfast. Patient not taking: Reported on 06/18/2016 01/07/16   Konrad Felix, PA  meloxicam (MOBIC) 7.5 MG tablet Take 1-2 tablets (7.5-15 mg total) by mouth daily. Patient not taking: Reported on 06/18/2016 07/28/13   Waldemar Dickens, MD  metroNIDAZOLE (FLAGYL)  500 MG tablet Take 1 tablet (500 mg total) by mouth 2 (two) times daily. X 7 days Patient not taking: Reported on 06/18/2016 12/24/14   Janne Napoleon, NP  pantoprazole (PROTONIX) 40 MG tablet Take 1 tablet (40 mg total) by mouth daily. Patient not taking: Reported on 06/18/2016 06/19/14   Mariel Aloe, MD  phenazopyridine (PYRIDIUM) 100 MG tablet Take 1 tablet (100 mg total) by mouth 3 (three) times daily as needed for pain. Patient not taking: Reported on 06/18/2016 03/21/14   Waldemar Dickens, MD  polyethylene glycol powder (GLYCOLAX/MIRALAX) powder Take 17 g by mouth daily. Patient not taking: Reported on 06/18/2016 12/17/14   Mariel Aloe, MD  potassium chloride SA (K-DUR,KLOR-CON) 20 MEQ tablet Take 1 tablet (20 mEq total) by mouth daily. Patient not taking: Reported on 06/18/2016 10/20/13   Waldemar Dickens, MD  pregabalin (LYRICA) 25 MG capsule Take 1 capsule (25 mg total) by mouth 3 (three) times daily. Patient not taking: Reported on 06/18/2016 11/15/14   Mariel Aloe, MD  senna (SENOKOT) 8.6 MG tablet Take 2 tablets (17.2 mg total) by mouth 2 (two) times daily. Once achieve daily BM decrease to one tablet daily Patient not taking: Reported on 06/18/2016 10/24/13   Waldemar Dickens, MD  sodium phosphate (FLEET) enema Place 1 enema rectally once. follow package directions Patient not taking: Reported on 06/18/2016 10/24/13   Waldemar Dickens, MD  triamcinolone (NASACORT AQ) 55 MCG/ACT AERO nasal inhaler Place 2 sprays into the nose daily. Patient not taking: Reported on 06/18/2016 05/14/14   Mariel Aloe, MD  valsartan (DIOVAN) 320 MG tablet Take 1 tablet (320 mg total) by mouth daily. Patient not taking: Reported on 06/18/2016 01/07/16   Konrad Felix, PA   Fam HX:    Family History  Problem Relation Age of Onset  . Diabetes Mother   . Stomach cancer Sister   . Colon cancer      grandmother   Social HX:    Social History   Social History  . Marital status: Single    Spouse name: N/A  .  Number of children: 3  . Years of education: 9/ GED   Occupational History  . retiredAdministrator, [employer] Service    Social History Main Topics  . Smoking status: Former Smoker    Quit date: 10/13/2011  . Smokeless tobacco: Never Used  . Alcohol use No  . Drug use: No  . Sexual activity: Not on file   Other Topics Concern  . Not on file   Social History Narrative   Health Care POA:    Emergency Contact: Daughter, Lurline Hare [phone]   End of Life Plan:    Who lives with you: Lives son.    Any pets: none   Diet: Patient currently does not follow a diabetic diet plan.  She reports eating sugary foods often.   Exercise: Patient does not have a current exercise  plan.   Seatbelts: Patient reports wearing her seatbelt when she is in vehicle.   Hobbies: Dancing, watching tv, swimming, singing                 ROS:  All 11 ROS were addressed and are negative except what is stated in the HPI   Physical Exam: Blood pressure 191/90, pulse 79, temperature 98.2 F (36.8 C), temperature source Oral, resp. rate 15, SpO2 99 %.   General: Well developed, well nourished, in no acute distress Head: Eyes PERRLA, No xanthomas.   Normal cephalic and atramatic  Lungs:   Clear bilaterally to auscultation and percussion. Normal respiratory effort. No wheezes, no rales. Heart:  irreg irreg S1 S2 Pulses are 2+ & equal. No murmur, rubs, gallops.  No carotid bruit. No JVD.  No abdominal bruits.  Abdomen: Bowel sounds are positive, abdomen soft and non-tender without masses. No hepatosplenomegaly. Msk:  Back normal. Normal strength and tone for age. Extremities:  No clubbing, cyanosis or edema.  DP +1 Neuro: Alert and oriented X 3, non-focal, MAE x 4 GU: Deferred Rectal: Deferred Psych:  Good affect, responds appropriately      Labs: Lab Results  Component Value Date   WBC 8.0 06/18/2016   HGB 11.1 (L) 06/18/2016   HCT 34.4 (L) 06/18/2016   MCV 81.9 06/18/2016   PLT 185 06/18/2016     Recent  Labs Lab 06/18/16 1431  NA 139  K 3.4*  CL 103  CO2 28  BUN 12  CREATININE 1.18*  CALCIUM 9.1  GLUCOSE 239*   No results for input(s): CKTOTAL, CKMB, TROPONINI in the last 72 hours. Lab Results  Component Value Date   CHOL 134 10/25/2013   HDL 52 10/25/2013   LDLCALC 68 10/25/2013   TRIG 71 10/25/2013   No results found for: [facility]   Radiology:  No results found. Personally viewed.  EKG:  Atrial fibrillation heart rate 80s with nonspecific ST changes. Personally viewed.   ASSESSMENT/PLAN:    75 year old female with newly discovered atrial fibrillation, vertigo, positional dizziness.  Paroxysmal atrial fibrillation  - Newly discovered. Unknown length of time.  - Currently rate controlled. No indication for cardioversion.  - I would continue with rate control for now.  - Eliquis 5 mg twice a day would be recommended for anticoagulation. This may be appropriate given her diagnosis of Alzheimer's. Discussed with her and daughter.   - If her atrial fibrillation becomes uncontrollable for a rate standpoint, we consider early TEE cardioversion. For now, I would advocate continued rate control and possible cardioversion in 3 weeks if she is symptomatic.  - Her vertiginous symptoms may not be related to her atrial fibrillation. She's had vertigo in the past.   - Hypokalemia per primary team.   - Check ECHO.   Will follow along.   Candee Furbish, MD  06/18/2016  6:33 PM

## 2016-06-18 NOTE — ED Triage Notes (Signed)
Pt here with c/o vertigo this morning upon awakening. Hx of vertigo and it made her feel very anxious. She denies feeling dizzy at this time. Dizziness happens when she changes positions and when she walks. A&OX4.

## 2016-06-18 NOTE — Progress Notes (Signed)
CRITICAL VALUE ALERT  Critical value received:  Troponin 0.03  Date of notification:  06/18/2016  Time of notification:  2320  Critical value read back: yes  Nurse who received alert:  Dorise Bullion  MD notified (1st page):  Fudim  Time of first page:  2328  MD notified (2nd page):  Time of second page:  Responding MD:   Time MD responded:

## 2016-06-18 NOTE — ED Notes (Signed)
Admitting at bedside 

## 2016-06-19 ENCOUNTER — Observation Stay (HOSPITAL_COMMUNITY): Payer: Commercial Managed Care - HMO

## 2016-06-19 DIAGNOSIS — I10 Essential (primary) hypertension: Secondary | ICD-10-CM | POA: Diagnosis not present

## 2016-06-19 DIAGNOSIS — I481 Persistent atrial fibrillation: Secondary | ICD-10-CM

## 2016-06-19 DIAGNOSIS — R269 Unspecified abnormalities of gait and mobility: Secondary | ICD-10-CM | POA: Diagnosis not present

## 2016-06-19 DIAGNOSIS — Z87891 Personal history of nicotine dependence: Secondary | ICD-10-CM | POA: Diagnosis not present

## 2016-06-19 DIAGNOSIS — Z7984 Long term (current) use of oral hypoglycemic drugs: Secondary | ICD-10-CM | POA: Diagnosis not present

## 2016-06-19 DIAGNOSIS — E039 Hypothyroidism, unspecified: Secondary | ICD-10-CM | POA: Diagnosis not present

## 2016-06-19 DIAGNOSIS — R0789 Other chest pain: Secondary | ICD-10-CM | POA: Diagnosis not present

## 2016-06-19 DIAGNOSIS — I480 Paroxysmal atrial fibrillation: Secondary | ICD-10-CM | POA: Diagnosis not present

## 2016-06-19 DIAGNOSIS — I4891 Unspecified atrial fibrillation: Secondary | ICD-10-CM | POA: Diagnosis not present

## 2016-06-19 DIAGNOSIS — E119 Type 2 diabetes mellitus without complications: Secondary | ICD-10-CM | POA: Diagnosis not present

## 2016-06-19 DIAGNOSIS — Z7982 Long term (current) use of aspirin: Secondary | ICD-10-CM | POA: Diagnosis not present

## 2016-06-19 DIAGNOSIS — G309 Alzheimer's disease, unspecified: Secondary | ICD-10-CM | POA: Diagnosis not present

## 2016-06-19 DIAGNOSIS — Z79899 Other long term (current) drug therapy: Secondary | ICD-10-CM | POA: Diagnosis not present

## 2016-06-19 LAB — GLUCOSE, CAPILLARY
GLUCOSE-CAPILLARY: 178 mg/dL — AB (ref 65–99)
GLUCOSE-CAPILLARY: 176 mg/dL — AB (ref 65–99)
GLUCOSE-CAPILLARY: 187 mg/dL — AB (ref 65–99)
Glucose-Capillary: 186 mg/dL — ABNORMAL HIGH (ref 65–99)

## 2016-06-19 LAB — TROPONIN I: Troponin I: 0.03 ng/mL

## 2016-06-19 LAB — COMPREHENSIVE METABOLIC PANEL
ALK PHOS: 79 U/L (ref 38–126)
ALT: 11 U/L — ABNORMAL LOW (ref 14–54)
ANION GAP: 9 (ref 5–15)
AST: 17 U/L (ref 15–41)
Albumin: 3.4 g/dL — ABNORMAL LOW (ref 3.5–5.0)
BUN: 7 mg/dL (ref 6–20)
CALCIUM: 9.1 mg/dL (ref 8.9–10.3)
CO2: 27 mmol/L (ref 22–32)
CREATININE: 0.87 mg/dL (ref 0.44–1.00)
Chloride: 102 mmol/L (ref 101–111)
Glucose, Bld: 213 mg/dL — ABNORMAL HIGH (ref 65–99)
Potassium: 3.2 mmol/L — ABNORMAL LOW (ref 3.5–5.1)
SODIUM: 138 mmol/L (ref 135–145)
TOTAL PROTEIN: 6.9 g/dL (ref 6.5–8.1)
Total Bilirubin: 0.4 mg/dL (ref 0.3–1.2)

## 2016-06-19 LAB — CBC
HCT: 35.1 % — ABNORMAL LOW (ref 36.0–46.0)
HEMOGLOBIN: 11.3 g/dL — AB (ref 12.0–15.0)
MCH: 26.1 pg (ref 26.0–34.0)
MCHC: 32.2 g/dL (ref 30.0–36.0)
MCV: 81.1 fL (ref 78.0–100.0)
Platelets: 180 10*3/uL (ref 150–400)
RBC: 4.33 MIL/uL (ref 3.87–5.11)
RDW: 13 % (ref 11.5–15.5)
WBC: 8.9 10*3/uL (ref 4.0–10.5)

## 2016-06-19 MED ORDER — INFLUENZA VAC SPLIT QUAD 0.5 ML IM SUSY: 0.5000 mL | PREFILLED_SYRINGE | INTRAMUSCULAR | Status: DC

## 2016-06-19 MED ORDER — POTASSIUM CHLORIDE CRYS ER 20 MEQ PO TBCR: 40.0000 meq | EXTENDED_RELEASE_TABLET | Freq: Every day | ORAL | Status: DC

## 2016-06-19 MED ORDER — ATORVASTATIN CALCIUM 40 MG PO TABS
40.0000 mg | ORAL_TABLET | Freq: Every day | ORAL | Status: DC
  Administered 2016-06-19 – 2016-06-20 (×2): 40 mg via ORAL
  Filled 2016-06-19 (×2): qty 1

## 2016-06-19 MED ORDER — PNEUMOCOCCAL VAC POLYVALENT 25 MCG/0.5ML IJ INJ
0.5000 mL | INJECTION | INTRAMUSCULAR | Status: DC
  Filled 2016-06-19: qty 0.5

## 2016-06-19 MED ORDER — ONDANSETRON HCL 4 MG PO TABS: 4.0000 mg | ORAL_TABLET | ORAL | Status: DC | PRN

## 2016-06-19 MED ORDER — HYDROCHLOROTHIAZIDE 12.5 MG PO CAPS
12.5000 mg | ORAL_CAPSULE | Freq: Every day | ORAL | Status: DC
  Administered 2016-06-19 – 2016-06-20 (×2): 12.5 mg via ORAL
  Filled 2016-06-19 (×2): qty 1

## 2016-06-19 MED ORDER — POTASSIUM CHLORIDE CRYS ER 20 MEQ PO TBCR
80.0000 meq | EXTENDED_RELEASE_TABLET | Freq: Once | ORAL | Status: AC
  Administered 2016-06-19: 80 meq via ORAL
  Filled 2016-06-19: qty 4

## 2016-06-19 MED ORDER — ONDANSETRON HCL 4 MG/2ML IJ SOLN: 4.0000 mg | INTRAMUSCULAR | Status: DC | PRN

## 2016-06-19 NOTE — Care Management Obs Status (Signed)
[facility] NOTIFICATION   Patient Details  Name: SHAREENA WILLENBRINK MRN: [id number] Date of Birth: [birth year]   Medicare Observation Status Notification Given:  Yes    Bethena Roys, RN 06/19/2016, 4:12 PM

## 2016-06-19 NOTE — Progress Notes (Signed)
Patient Name: Kristine Boyer Date of Encounter: 06/19/2016  Primary Cardiologist: [facility] Problem List     Principal Problem:   Chest pain Active Problems:   Hypothyroidism   Diabetes mellitus type 2, uncontrolled (HCC)   Hyperlipidemia   HYPERTENSION, BENIGN SYSTEMIC   Hypokalemia   BPPV (benign paroxysmal positional vertigo)   Dysphagia   CN (constipation)   Atrial fibrillation (HCC)     Subjective   Smiling, feeling good. No cp  noSOB  Inpatient Medications    Scheduled Meds: . apixaban  5 mg Oral BID  . atorvastatin  40 mg Oral Daily  . clotrimazole   Topical BID  . glipiZIDE  5 mg Oral QAC breakfast  . hydrochlorothiazide  12.5 mg Oral Daily  . [START ON 06/20/2016] Influenza vac split quadrivalent PF  0.5 mL Intramuscular Tomorrow-1000  . irbesartan  150 mg Oral Daily  . levothyroxine  175 mcg Oral QAC breakfast  . metoprolol  200 mg Oral Daily  . pantoprazole  40 mg Oral Daily  . [START ON 06/20/2016] pneumococcal 23 valent vaccine  0.5 mL Intramuscular Tomorrow-1000  . polyethylene glycol  17 g Oral Daily  . senna  2 tablet Oral BID  . sodium chloride flush  3 mL Intravenous Q12H   Continuous Infusions:  PRN Meds:.acetaminophen, meclizine, ondansetron **OR** ondansetron (ZOFRAN) IV   Vital Signs    Vitals:   06/18/16 2130 06/19/16 0503 06/19/16 0521 06/19/16 0850  BP: (!) 170/96 (!) 170/115 (!) 157/91 (!) 198/119  Pulse: (!) 102 (!) 103    Resp:  18    Temp: 97.9 F (36.6 C) 97.8 F (36.6 C)    TempSrc: Oral Oral    SpO2: 100% 100%    Weight: 174 lb 12.8 oz (79.3 kg) 174 lb 12.8 oz (79.3 kg)    Height: 5' 2.5" (1.588 m)       Intake/Output Summary (Last 24 hours) at 06/19/16 1031 Last data filed at 06/19/16 0803  Gross per 24 hour  Intake              600 ml  Output             1900 ml  Net            -1300 ml   Filed Weights   06/18/16 2130 06/19/16 0503  Weight: 174 lb 12.8 oz (79.3 kg) 174 lb 12.8 oz (79.3 kg)    Physical  Exam    GEN: Well nourished, well developed, in no acute distress.  HEENT: Grossly normal.  Neck: Supple, no JVD, carotid bruits, or masses. Cardiac: irreg irreg, no murmurs, rubs, or gallops. No clubbing, cyanosis, edema.  Radials/DP/PT 2+ and equal bilaterally.  Respiratory:  Respirations regular and unlabored, clear to auscultation bilaterally. GI: Soft, nontender, nondistended, BS + x 4. MS: no deformity or atrophy. Skin: warm and dry, no rash. Neuro:  Strength and sensation are intact. Psych: AAOx3.  Normal affect.  Labs    CBC  Recent Labs  06/18/16 1431 06/19/16 0509  WBC 8.0 8.9  HGB 11.1* 11.3*  HCT 34.4* 35.1*  MCV 81.9 81.1  PLT 185 [id number]   Basic Metabolic Panel  Recent Labs  06/18/16 1431 06/18/16 2159 06/19/16 0509  NA 139  --  138  K 3.4*  --  3.2*  CL 103  --  102  CO2 28  --  27  GLUCOSE 239*  --  213*  BUN 12  --  7  CREATININE 1.18*  --  0.87  CALCIUM 9.1  --  9.1  MG  --  1.9  --    Liver Function Tests  Recent Labs  06/19/16 0509  AST 17  ALT 11*  ALKPHOS 79  BILITOT 0.4  PROT 6.9  ALBUMIN 3.4*   No results for input(s): LIPASE, AMYLASE in the last 72 hours. Cardiac Enzymes  Recent Labs  06/18/16 2159 06/19/16 0509  TROPONINI 0.03* 0.03*   Recent Labs  06/18/16 1645  TSH 6.576*    Telemetry    AFIB rate conrolled- Personally Reviewed  ECG    AFIB rate controlled - Personally Reviewed  Radiology    No results found.   Cardiac Studies   ECHO P  Patient Profile     75 year old female with newly discovered atrial fibrillation, vertigo, positional dizziness.  Assessment & Plan   Paroxysmal atrial fibrillation  - Newly discovered. Unknown length of time.  - Currently rate controlled. No indication for cardioversion.  - I would continue with rate control for now.  - Eliquis 5 mg twice a day would be recommended for anticoagulation. This may be appropriate given her diagnosis of Alzheimer's?. Discussed with her  and daughter.   - If her atrial fibrillation becomes uncontrollable for a rate standpoint, we consider early TEE cardioversion. For now, I would advocate continued rate control and possible cardioversion in 3 weeks if she is symptomatic.  - Her vertiginous symptoms may not be related to her atrial fibrillation. She's had vertigo in the past.   - Hypokalemia per primary team.   - ECHO pending.   Essential hypertension  - per primary team  Troponin mildly elevated   - low flat level, Demand ischemia  - echo pending.   Will set up follow up in 2-3 in [place] clinic, sent message.  Signed, Candee Furbish, MD  06/19/2016, 10:31 AM

## 2016-06-19 NOTE — Progress Notes (Signed)
Transitions of Care Pharmacy Note  Plan:  Educated on Eliquis, Levothyroxine, Atorvastatin, Metoprolol, Glipizide, Valsartan, HCTZ, Protonix Addressed concerns regarding acquisition of medications Recommend providing with AVS as soon as possible  Kristine Boyer is an 75 y.o. female who presents with a chief complaint of dizziness and vertigo. In anticipation of discharge, pharmacy has reviewed this patient's prior to admission medication history, as well as current inpatient medications listed per the [facility].  Current medication indications, dosing, frequency, and notable side effects reviewed with patient and family. patient and family verbalized understanding of current inpatient medication regimen and are aware that the After Visit Summary when presented, will represent the most accurate medication list at discharge.   Kristine Boyer expressed concerns regarding when she will start these medications. I informed her and her family that she has already started receiving these medications during her inpatient visit, and that her prescriptions will be sent to her preferred pharmacy of [facility] in [place], [place] as part of her discharge. She also wants a copy of the medications she will receive. I explained that she will get this as part of the AVS.   Assessment: Understanding of regimen: fair Understanding of indications: fair Potential of compliance: fair Barriers to Obtaining Medications: Yes, she has baseline dementia and acquisition is based on family to pick up said medications and properly sort them for her administration. They expressed good understanding of the need to manage her medication.   Patient instructed to contact inpatient pharmacy team with further questions or concerns if needed.    Time spent preparing for discharge counseling: 15 minutes Time spent counseling patient: 45 minutes   Thank you for allowing pharmacy to be a part of this patient's care.  Dierdre Harness, [place],  PharmD Clinical Pharmacy Resident [phone] (Pager) 06/19/2016 6:53 PM

## 2016-06-19 NOTE — Progress Notes (Signed)
Family Medicine Teaching Service Daily Progress Note Intern Pager: [phone]  Patient name: Kristine Boyer Medical record number: [id number] Date of birth: September 13, 1941 Age: 75 y.o. Gender: female  Primary Care Provider: Melina Schools, DO Consultants: Cardiology Code Status: Full  Pt Overview and Major Events to Date:  9/21: Admitted to [facility] for [facility]igo 9/22: Transferred to [facility]  Assessment and Plan: Kristine Boyer is a 75 y.o. female with medical history significant for Alzheimer's disease, anemia, bipolar affective disorder, cataracts, type 2 diabetes, GERD, hyperlipidemia, hypertension, hypothyroidism who was admitted for vertigo and chest pain  Chest pain. Denies chest pain this morning.  Supplemental oxygen as needed. Trend troponin levels - neg x2 Repeat EKG unchanged Echo pending Telemetry Cards following; appreciate recs  Paroxysmal Atrial fibrillation. Newly discovered. CHA2DS2-VASc Score of at least 4. Rate controlled.  Eliquis started; will continue Continue beta blocker Cardiology recs - possible cardioversion in 3 weeks  BPPV (benign paroxysmal positional vertigo). Known history of vertigo. The patient has had meclizine in the past with improvement. Meclizine 25 mg by mouth 3 times a day when necessary. PT for vestibular rehab  Hypothyroidism. TSH 6.6, free T4 .76  Resume levothyroxine.  Diabetes mellitus type 2, uncontrolled. Last A1c in 2016 8.8 Carbohydrate modified diet. Resume glipizide. Follow-up A1c as an outpatient  Hyperlipidemia Atorvastatin as home med. Continue.  Follow-up as an outpatient.  HYPERTENSION, BENIGN SYSTEMIC. Continues to be hypertensive. Resume metoprolol, 200 mg by mouth daily. Resume valsartan 320 mg by mouth daily or generic equivalent. Will add HCTZ for better BP control  Hypokalemia. K 3.2 this AM Replaced. Follow-up potassium level.  Dysphagia Secondary to pressure from inoperable, per patient's daughter,  C-spine bone spur. Follow-up bone spur as outpatient Dysphagia diet.  Constipation Continue daily MiraLAX as needed. Continue Senokot 1 tab by mouth twice a day.  FEN/GI: dys 1 diet PPx: Eliquis  Disposition: Likely discharge today   Subjective:  Doing well this morning. States she feels much better. Having only mild dizziness and nausea. Denies chest pain.   Objective: Temp:  [97.8 F (36.6 C)-98.2 F (36.8 C)] 97.8 F (36.6 C) (09/22 0503) Pulse Rate:  [66-103] 103 (09/22 0503) Resp:  [13-31] 18 (09/22 0503) BP: (93-219)/(76-158) 157/91 (09/22 0521) SpO2:  [98 %-100 %] 100 % (09/22 0503) Weight:  [174 lb 12.8 oz (79.3 kg)] 174 lb 12.8 oz (79.3 kg) (09/22 0503) Physical Exam: General: elderly AAF, well-appearing, NAD, lying in bed, pleasant Cardiovascular: Irregular rhythm, rate controlled, pulses intact Respiratory: CTAB, normal work of breathing Abdomen: soft, nontender Extremities: normal ROM, no edema Skin: Hyprepigmented, 3x2 cm scaly plaque on occipital cervical area.   Laboratory:  Recent Labs Lab 06/18/16 1431 06/19/16 0509  WBC 8.0 8.9  HGB 11.1* 11.3*  HCT 34.4* 35.1*  PLT 185 180    Recent Labs Lab 06/18/16 1431 06/19/16 0509  NA 139 138  K 3.4* 3.2*  CL 103 102  CO2 28 27  BUN 12 7  CREATININE 1.18* 0.87  CALCIUM 9.1 9.1  PROT  --  6.9  BILITOT  --  0.4  ALKPHOS  --  79  ALT  --  11*  AST  --  17  GLUCOSE 239* 213*   Trop neg x2 TSH   Imaging/Diagnostic Tests: No results found.   Katheren Shams, DO 06/19/2016, 8:21 AM PGY-3, [place] Intern pager: [phone], text pages welcome

## 2016-06-19 NOTE — Progress Notes (Signed)
Pt sleeping. Will cont to  Monitor pt.

## 2016-06-19 NOTE — Care Management Note (Addendum)
Case Management Note  Patient Details  Name: Kristine Boyer MRN: [id number] Date of Birth: Jan 30, 1941  Subjective/Objective:     Pt presented for chest pain. Plan to return to daughters home once stable for d/c. PT recommendations for outpatient PT Vestibular therapy.                Action/Plan: Pt is agreeable to [address] for therapy. Ambulatory referral made and office to call patient. DME to be delivered to room via [facility] RW and 3n1. Eliquis and 30 day free co pay card provided to patient. No further needs from CM at this time.  S/W KAYLA @ [employer] # [phone]   ELIQUIS   5 MG BID 9 30 )  COVER- YES  CO-PAY- $ 8.25   Q/L 74 / 30  TIER- 3 DRUG  PRIOR APPROVAL NO  PHARMACY : [facility]  MAIL-ORDER FOR 90 DAY SUPPLY $ 8.25  Expected Discharge Date:                  Expected Discharge Plan:  Home/Self Care  In-House Referral:  NA  Discharge planning Services  CM Consult, Medication Assistance  Post Acute Care Choice:  Durable Medical Equipment Choice offered to:  Patient  DME Arranged:  Walker rolling, 3-N-1 DME Agency:  [place]:  NA [place] Agency:  NA  Status of Service:  Completed, signed off  If discussed at [place] of Stay Meetings, dates discussed:    Additional Comments:  Bethena Roys, RN 06/19/2016, 4:22 PM

## 2016-06-19 NOTE — Clinical Social Work Note (Signed)
Consult received for medication assistance needs which also stated "nursing home placement." Patient has been seen by PT and recommendations for outpatient PT have been made. CSW signing off at this time.   Liz Beach MSW, [place], [place], [phone]

## 2016-06-19 NOTE — Progress Notes (Signed)
Pt has had 2 loose stools today. Pt has stool softeners ordered. Pt is also c/o dizziness and nausua when she got up to use the bathroom. Orthostatic vitals signs obtained. PRN meclizine and Zofran given. Will cont to monitor pt.

## 2016-06-19 NOTE — Evaluation (Signed)
Physical Therapy Evaluation Patient Details Name: Kristine Boyer MRN: [id number] DOB: 1941/01/19 Today's Date: 06/19/2016   History of Present Illness  CAEDENCE Boyer is a 75 y.o. female with medical history significant of colon polyps, allergies, Alzheimer's disease, anemia, bipolar affective disorder, cataracts, type 2 diabetes, GERD, hyperlipidemia, hypertension, hypothyroidism, knee and deficiency disorder, internal hemorrhoids, seizure disorder was brought to the emergency department today by her daughter due to dizziness and vertigo since this morning. Symptoms are worsened by shifting positions or ambulating.  Clinical Impression  Patient presents with symptoms of R horizontal canal BPPV cupulolithiasis.  She reports previous history of vertigo requiring treatment at outpatient PT.  Feel she is safe for d/c home with family support and walker for home with follow up outpatient vestibular rehab.  Will follow acutely until d/c.    Follow Up Recommendations Outpatient PT (vestibular rehab)    Equipment Recommendations  Rolling walker with 5" wheels    Recommendations for Other Services       Precautions / Restrictions Precautions Precautions: Fall      Mobility  Bed Mobility Overal bed mobility: Needs Assistance Bed Mobility: Supine to Sit;Sit to Sidelying;Sidelying to Sit   Sidelying to sit: Min assist Supine to sit: Min guard;HOB elevated   Sit to sidelying: Min assist General bed mobility comments: initially sat up mainly on her own, some wooziness so minguard for safety; then with sidelying tests provided assist due to cervical pain and vertigo  Transfers Overall transfer level: Modified independent Equipment used: Rolling walker (2 wheeled)             General transfer comment: able to stand unaided from bed and toilet  Ambulation/Gait Ambulation/Gait assistance: Supervision Ambulation Distance (Feet): 270 Feet Assistive device: Rolling walker (2  wheeled) Gait Pattern/deviations: Step-through pattern;Decreased stride length;Drifts right/left     General Gait Details: reportedly felt as if she was pulling to the left and did have to help manage walker once due to running into wall on L in hallway, but mostly steady with walker and not needing physical help  Stairs            Wheelchair Mobility    Modified Rankin (Stroke Patients Only)       Balance Overall balance assessment: Needs assistance   Sitting balance-Leahy Scale: Good       Standing balance-Leahy Scale: Fair Standing balance comment: can stand static without UE support, but with wide BOS; walker for more confidence with dynamic activities                             Pertinent Vitals/Pain Pain Assessment: Faces Faces Pain Scale: Hurts little more Pain Location: cervical pain with rotations, L flank pain at times with movement Pain Descriptors / Indicators: Sharp Pain Intervention(s): Limited activity within patient's tolerance;Monitored during session;Repositioned    Home Living Family/patient expects to be discharged to:: Private residence Living Arrangements: Children Available Help at Discharge: Family Type of Home: House Home Access: Stairs to enter Entrance Stairs-Rails: None Technical brewer of Steps: 4 Home Layout: One level Home Equipment: Cane - single point;Grab bars - tub/shower;Grab bars - toilet      Prior Function Level of Independence: Independent with assistive device(s)         Comments: uses cane in community, recently gave up driving due to memory issues     Hand Dominance        Extremity/Trunk Assessment   Upper  Extremity Assessment: Overall WFL for tasks assessed           Lower Extremity Assessment: Overall WFL for tasks assessed      Cervical / Trunk Assessment: Other exceptions  Communication   Communication: No difficulties  Cognition Arousal/Alertness: Awake/alert Behavior  During Therapy: WFL for tasks assessed/performed Overall Cognitive Status: History of cognitive impairments - at baseline                      General Comments General comments (skin integrity, edema, etc.): Seated oculomotor assessment WNL, difficulty with head thurst testing and head shake testing due to cervical pain and stiffness, noted intact VOR and no noted dizziness, but positive for neck pain.  Patient reports previous history of vertigo with treatment at Neuro outpatient maybe 2-3 years ago without recurrence until now.  Patient relates spinning that started suddenly and lasts for several hours, sometimes a full day.  Has to stay still to stop the motion.  Reports history of R ear hearing loss and had hearing aide, but it fell out.  Relates some improvement with medications (nausea and Meclizine) then rest then eating something.  Performed sidelying test for posterior canal BPPV: negative to L then to R difficulty getting head turned due to neck stiffness and noted ageotropic horizontal nystagmus in R sidelying.  Performed repositioning with severe nausea upon sitting upright.    Exercises     Assessment/Plan    PT Assessment Patient needs continued PT services  PT Problem List Decreased knowledge of use of DME;Other (comment);Decreased activity tolerance;Decreased safety awareness;Decreased balance (dizziness)          PT Treatment Interventions DME instruction;Gait training;Functional mobility training;Balance training;Stair training;Patient/family education;Therapeutic activities    PT Goals (Current goals can be found in the Care Plan section)  Acute Rehab PT Goals Patient Stated Goal: To get rid of dizziness PT Goal Formulation: With patient/family Time For Goal Achievement: 06/26/16 Potential to Achieve Goals: Good    Frequency Min 3X/week   Barriers to discharge        Co-evaluation               End of Session Equipment Utilized During Treatment:  Gait belt Activity Tolerance: Patient limited by fatigue Patient left: in bed;with call bell/phone within reach;with family/visitor present      Functional Assessment Tool Used: Clinical Judgement Functional Limitation: Mobility: Walking and moving around Mobility: Walking and Moving Around Current Status [id number]): At least 20 percent but less than 40 percent impaired, limited or restricted Mobility: Walking and Moving Around Goal Status [phone]): At least 1 percent but less than 20 percent impaired, limited or restricted    Time: 1444-1544 PT Time Calculation (min) (ACUTE ONLY): 60 min   Charges:   PT Evaluation $PT Eval Moderate Complexity: 1 Procedure PT Treatments $Gait Training: 8-22 mins $Neuromuscular Re-education: 8-22 mins $Canalith Rep Proc: 8-22 mins   PT G Codes:   PT G-Codes **NOT FOR INPATIENT CLASS** Functional Assessment Tool Used: Clinical Judgement Functional Limitation: Mobility: Walking and moving around Mobility: Walking and Moving Around Current Status [id number]): At least 20 percent but less than 40 percent impaired, limited or restricted Mobility: Walking and Moving Around Goal Status [phone]): At least 1 percent but less than 20 percent impaired, limited or restricted    Reginia Naas 06/19/2016, 4:30 PM  Magda Kiel, [place] 06/19/2016

## 2016-06-20 ENCOUNTER — Observation Stay (HOSPITAL_BASED_OUTPATIENT_CLINIC_OR_DEPARTMENT_OTHER): Payer: Commercial Managed Care - HMO

## 2016-06-20 DIAGNOSIS — I4891 Unspecified atrial fibrillation: Secondary | ICD-10-CM

## 2016-06-20 LAB — GLUCOSE, CAPILLARY
Glucose-Capillary: 210 mg/dL — ABNORMAL HIGH (ref 65–99)
Glucose-Capillary: 146 mg/dL — ABNORMAL HIGH (ref 65–99)

## 2016-06-20 LAB — BASIC METABOLIC PANEL
Anion gap: 7 (ref 5–15)
BUN: 14 mg/dL (ref 6–20)
CALCIUM: 9.2 mg/dL (ref 8.9–10.3)
CO2: 26 mmol/L (ref 22–32)
CREATININE: 1.12 mg/dL — AB (ref 0.44–1.00)
Chloride: 107 mmol/L (ref 101–111)
GFR calc Af Amer: 55 mL/min — ABNORMAL LOW (ref >60)
GFR calc non Af Amer: 47 mL/min — ABNORMAL LOW (ref >60)
GLUCOSE: 163 mg/dL — AB (ref 65–99)
Potassium: 3.9 mmol/L (ref 3.5–5.1)
Sodium: 140 mmol/L (ref 135–145)

## 2016-06-20 LAB — CBC
HCT: 33.1 % — ABNORMAL LOW (ref 36.0–46.0)
Hemoglobin: 11 g/dL — ABNORMAL LOW (ref 12.0–15.0)
MCH: 27 pg (ref 26.0–34.0)
MCHC: 33.2 g/dL (ref 30.0–36.0)
MCV: 81.3 fL (ref 78.0–100.0)
PLATELETS: 172 10*3/uL (ref 150–400)
RBC: 4.07 MIL/uL (ref 3.87–5.11)
RDW: 13.4 % (ref 11.5–15.5)
WBC: 8 10*3/uL (ref 4.0–10.5)

## 2016-06-20 LAB — ECHOCARDIOGRAM COMPLETE
HEIGHTINCHES: 62.5 in
WEIGHTICAEL: 2796.8 [oz_av]

## 2016-06-20 MED ORDER — MECLIZINE HCL 25 MG PO TABS: 25.0000 mg | ORAL_TABLET | Freq: Three times a day (TID) | ORAL | 0 refills | Status: DC | PRN

## 2016-06-20 MED ORDER — APIXABAN 5 MG PO TABS: 5.0000 mg | ORAL_TABLET | Freq: Two times a day (BID) | ORAL | 0 refills | Status: DC

## 2016-06-20 MED ORDER — HYDROCHLOROTHIAZIDE 12.5 MG PO CAPS: 12.5000 mg | ORAL_CAPSULE | Freq: Every day | ORAL | 0 refills | Status: DC

## 2016-06-20 NOTE — Discharge Instructions (Signed)
It is very important to start taking your new medications as prescribed.  Begin taking Eliquis 5 mg twice a day. This is for your irregular heart rhythm.  Also, begin taking HCTZ for your high blood pressure.  You can continue to take your other medications as you were before hospital admission.   Please schedule an appointment with your primary doctor (Dr. Juleen China) within the next week.

## 2016-06-20 NOTE — Progress Notes (Signed)
  Echocardiogram 2D Echocardiogram has been performed.  Jennette Dubin 06/20/2016, 9:46 AM

## 2016-06-20 NOTE — Discharge Summary (Signed)
[facility] Discharge Summary  Patient name: Kristine Boyer Medical record number: [id number] Date of birth: 10/28/1940 Age: 75 y.o. Gender: female Date of Admission: 06/18/2016  Date of Discharge: 06/20/16 Admitting Physician: Reubin Milan, MD  Primary Care Provider: Melina Schools, DO Consultants: Cardiology  Indication for Hospitalization: chest pain, vertigo  Discharge Diagnoses/Problem List:  Patient Active Problem List   Diagnosis Date Noted  . Atrial fibrillation ([place]) 06/18/2016  . Chest pain 06/18/2016  . Angina pectoris, unspecified ([place]) 08/05/2015  . Scalp pain 01/16/2015  . CN (constipation) 12/17/2014  . Sciatica 10/08/2014  . Stiffness of neck 05/16/2014  . Mass of oropharynx 05/15/2014  . Dysuria 03/21/2014  . Abdominal pain 10/04/2013  . Flank pain 08/28/2013  . Dysphagia 08/28/2013  . Unspecified constipation 07/28/2013  . Forgetfulness 07/18/2013  . Leg swelling 02/14/2013  . Breast nodule 01/03/2013  . Musculoskeletal pain 08/16/2012  . Altered sensation, foot 04/26/2012  . Lung mass 08/05/2011  . OSA (obstructive sleep apnea) 06/12/2011  . Bulging disc 06/12/2011  . BPPV (benign paroxysmal positional vertigo) 05/18/2011  . Hypokalemia 03/18/2011  . De Quervain's syndrome (tenosynovitis) 02/23/2011  . Foot pain, right 11/11/2010  . BACK PAIN, LUMBAR 11/28/2009  . OBESITY 09/11/2008  . ALLERGIC RHINITIS 01/19/2008  . Hypothyroidism 11/25/2006  . Diabetes mellitus type 2, uncontrolled ([place]) 11/25/2006  . Hyperlipidemia 11/25/2006  . HYPERTENSION, BENIGN SYSTEMIC 11/25/2006  . GASTROESOPHAGEAL REFLUX, NO ESOPHAGITIS 11/25/2006  . OSTEOARTHRITIS, MULTI SITES 11/25/2006     Disposition: Home  Discharge Condition: Stable  Discharge Exam:  General: elderly AAF, well-appearing, NAD, lying in bed, pleasant Cardiovascular: Irregularly irregular rhythm, normal rate Respiratory: CTAB, normal work of breathing on  RA Abdomen: soft, nontender Neuro: A&Ox3 Psych: Appropriate mood and affect  Brief Hospital Course:   Kristine Boyer a 75 y.o.femalewith medical history significant for Alzheimer's disease, anemia, bipolar affective disorder, cataracts, type 2 diabetes, GERD, hyperlipidemia, hypertension, hypothyroidism who was admitted for vertigo and chest pain.   Chest pain Patient presented with chest pain. Troponins were negative x3, but patient was found to be in a.fib on EKG, which is more likely cause of chest pain than MI. Echo was obtained, which did not show any abnormalities. Patient was then cleared by cardiology for discharge with outpatient follow-up for a.fib (see below).   Newly diagnosed paroxysmal a.fib Patient initially presented with chest pain, and was found to be in a.fib on EKG in ED. Anticoagulation with Eliquis was started in ED, and patient was evaluated by cardiology. After echo showed no structural abnormalities, patient was deemed stable for discharge on Eliquis with outpatient cards f/u.    Benign paroxysmal positional vertigo Patient with known history of this. Presented with dizziness, and was started on meclizine 46m TID PRN. Was evaluated by PT, who recommended outpatient vestibular rehab on discharge. Sx improved with meclizine, and patient not reporting any dizziness on day of discharge. She was also able to ambulate unassisted without dizziness on day of discharge, and thus deemed stable for discharge home.   Issues for Follow Up:  1. Patient started on Eliquis 5 mg BID for a.fib. 2.   Started on HCTZ for HTN.  3.   Please ensure patient has been contacted by cardiology for outpatient follow-up. Per Dr. KOlin Pianote on 9/23, patient's cards f/u has been arranged, however the date and time was not documented.  4.    Please ensure patient schedules appointment with outpatient vestibular rehab.  Significant Procedures:  none  Significant Labs and Imaging:   Recent  Labs Lab 06/18/16 1431 06/19/16 0509 06/20/16 0411  WBC 8.0 8.9 8.0  HGB 11.1* 11.3* 11.0*  HCT 34.4* 35.1* 33.1*  PLT 185 180 172    Recent Labs Lab 06/18/16 1431 06/18/16 2159 06/19/16 0509 06/20/16 1119  NA 139  --  138 140  K 3.4*  --  3.2* 3.9  CL 103  --  102 107  CO2 28  --  27 26  GLUCOSE 239*  --  213* 163*  BUN 12  --  7 14  CREATININE 1.18*  --  0.87 1.12*  CALCIUM 9.1  --  9.1 9.2  MG  --  1.9  --   --   ALKPHOS  --   --  79  --   AST  --   --  17  --   ALT  --   --  11*  --   ALBUMIN  --   --  3.4*  --     Echo (06/20/16): EF 60-65% Study Conclusions  - Left ventricle: The cavity size was normal. There was moderate   focal basal hypertrophy. Systolic function was normal. The   estimated ejection fraction was in the range of 60% to 65%. Wall   motion was normal; there were no regional wall motion   abnormalities. The study was not technically sufficient to allow   evaluation of LV diastolic dysfunction due to atrial   fibrillation. - Aortic valve: Trileaflet; mildly thickened, mildly calcified   leaflets. There was trivial regurgitation. - Mitral valve: There was mild regurgitation. - Left atrium: The atrium was moderately dilated. - Tricuspid valve: There was trivial regurgitation.  Results/Tests Pending at Time of Discharge: None  Discharge Medications:    Medication List    STOP taking these medications   baclofen 10 MG tablet Commonly known as:  LIORESAL   cephALEXin 500 MG capsule Commonly known as:  KEFLEX   cetirizine 10 MG tablet Commonly known as:  ZYRTEC   fluconazole 150 MG tablet Commonly known as:  DIFLUCAN   meloxicam 7.5 MG tablet Commonly known as:  MOBIC   metroNIDAZOLE 500 MG tablet Commonly known as:  FLAGYL   phenazopyridine 100 MG tablet Commonly known as:  PYRIDIUM   pregabalin 25 MG capsule Commonly known as:  LYRICA   sodium phosphate enema Commonly known as:  FLEET   triamcinolone 55 MCG/ACT Aero  nasal inhaler Commonly known as:  NASACORT AQ     TAKE these medications   ACCU-CHEK AVIVA PLUS w/Device Kit Please check your blood sugar once per day when you wake up.   ACCU-CHEK FASTCLIX LANCETS Misc 1 each by Does not apply route daily before breakfast.   apixaban 5 MG Tabs tablet Commonly known as:  ELIQUIS Take 1 tablet (5 mg total) by mouth 2 (two) times daily.   aspirin EC 81 MG tablet Take 81 mg by mouth daily.   atorvastatin 40 MG tablet Commonly known as:  LIPITOR Take 1 tablet (40 mg total) by mouth daily.   clotrimazole-betamethasone cream Commonly known as:  LOTRISONE Apply to affected area 2 times daily prn   glipiZIDE 5 MG tablet Commonly known as:  GLUCOTROL Take 1 tablet (5 mg total) by mouth daily before breakfast.   glucose blood test strip 1 each by Other route daily before breakfast.   hydrochlorothiazide 12.5 MG capsule Commonly known as:  MICROZIDE Take 1 capsule (12.5 mg total) by mouth  daily. Start taking on:  06/21/2016   levothyroxine 175 MCG tablet Commonly known as:  SYNTHROID, LEVOTHROID Take 1 tablet (175 mcg total) by mouth daily. What changed:  Another medication with the same name was removed. Continue taking this medication, and follow the directions you see here.   meclizine 25 MG tablet Commonly known as:  ANTIVERT Take 1 tablet (25 mg total) by mouth 3 (three) times daily as needed for dizziness.   metoprolol 200 MG 24 hr tablet Commonly known as:  TOPROL-XL Take 1 tablet (200 mg total) by mouth daily.   pantoprazole 40 MG tablet Commonly known as:  PROTONIX Take 1 tablet (40 mg total) by mouth daily.   polyethylene glycol powder powder Commonly known as:  GLYCOLAX/MIRALAX Take 17 g by mouth daily.   potassium chloride SA 20 MEQ tablet Commonly known as:  K-DUR,KLOR-CON Take 1 tablet (20 mEq total) by mouth daily.   senna 8.6 MG tablet Commonly known as:  SENOKOT Take 2 tablets (17.2 mg total) by mouth 2 (two)  times daily. Once achieve daily BM decrease to one tablet daily   valsartan 320 MG tablet Commonly known as:  DIOVAN Take 1 tablet (320 mg total) by mouth daily.       Discharge Instructions: Please refer to Patient Instructions section of EMR for full details.  Patient was counseled important signs and symptoms that should prompt return to medical care, changes in medications, dietary instructions, activity restrictions, and follow up appointments.   Follow-Up Appointments: Follow-up [facility] .   Why:  RW and [address] Contact information: [place] [address] [phone]        [facility] .   Specialty:  Rehabilitation Why:  Office to call patient with an appointment time.  Contact information: [address] [place] [id number] [place], DO. Schedule an appointment as soon as possible for a visit in 1 week(s).   Specialty:  Family Medicine Why:  Schedule appointment within one week.  Contact information: [place] [address] [phone]           Verner Mould, MD 06/20/2016, 4:11 PM PGY-2, [place]

## 2016-06-20 NOTE — Progress Notes (Signed)
CCMD called and advised patient had a 2.0 sec pause. Checked on patient and she is comfortable and resting. Will continue to monitor.

## 2016-06-20 NOTE — Progress Notes (Signed)
PT Cancellation Note  Patient Details Name: Kristine Boyer MRN: [id number] DOB: 12-30-1940   Cancelled Treatment:    Reason Eval/Treat Not Completed: Patient at procedure or test/unavailable. Pt off floor at ECHO. PT to return as able.   Kingsley Callander 06/20/2016, 9:30 AM  Kittie Plater, PT, DPT Pager #: [phone] Office #: [phone]

## 2016-06-20 NOTE — Progress Notes (Signed)
Cardiology followup has been arranged HR well controlled Will not see today unless called

## 2016-06-20 NOTE — Progress Notes (Signed)
Family Medicine Teaching Service Daily Progress Note Intern Pager: [phone]  Patient name: JAMEAH ROUBIDEAUX Medical record number: [id number] Date of birth: [birth year] Age: 75 y.o. Gender: female  Primary Care Provider: Melina Schools, DO Consultants: Cardiology Code Status: Full  Pt Overview and Major Events to Date:  9/21: Admitted to [facility] for vertigo 9/22: Transferred to [facility]  Assessment and Plan: ANALEIA LEIPHART is a 75 y.o. female with medical history significant for Alzheimer's disease, anemia, bipolar affective disorder, cataracts, type 2 diabetes, GERD, hyperlipidemia, hypertension, hypothyroidism who was admitted for vertigo and chest pain  Chest pain, resolved. Trop neg x3. EKGs unchanged.  - Supplemental oxygen as needed. - Echo pending - Telemetry - Cards following; appreciate recs  Paroxysmal Atrial fibrillation. Newly discovered. CHA2DS2-VASc score of at least 4. Rate controlled.  - Eliquis started; will continue - Continue beta blocker - Cardiology recs - possible cardioversion in 3 weeks  BPPV (benign paroxysmal positional vertigo). Known history of vertigo. The patient has had meclizine in the past with improvement. - Meclizine 25 mg by mouth 3 times a day when necessary. - PT for vestibular rehab - Will need outpatient vestibular rehab on discharge  Hypothyroidism. TSH 6.6, free T4 .76  - Resume levothyroxine  Diabetes mellitus type 2, uncontrolled. Last A1c in 2016 8.8. CBG elevated at 210 this AM.  - Carbohydrate modified diet - Resume glipizide - Follow-up A1c as an outpatient  Hyperlipidemia. Home atorvastatin - Continue home meds - Follow-up as an outpatient  HYPERTENSION, BENIGN SYSTEMIC. BP improved this AM to 135/78. - Resume metoprolol - Begin HCTZ 12.5mg    Hypokalemia. K 3.2 yesterday. Received replacement.  - AM BMP pending - Will replace as needed  Dysphagia. Secondary to pressure from inoperable, per patient's daughter,  C-spine bone spur. - Follow-up bone spur as outpatient - Dysphagia diet  Constipation - Continue daily MiraLAX as needed. - Continue Senokot 1 tab by mouth twice a day.  FEN/GI: dys 1 diet PPx: Eliquis  Disposition: Possibly home later today if no abnormalities on echo  Subjective:  Denies chest pain or vertigo today. Feeling well overall.  Objective: Temp:  [97.6 F (36.4 C)-98.3 F (36.8 C)] 97.6 F (36.4 C) (09/23 0500) Pulse Rate:  [62-68] 62 (09/23 0500) Resp:  [18] 18 (09/23 0500) BP: (135-144)/(75-93) 135/78 (09/23 0500) SpO2:  [99 %-100 %] 99 % (09/23 0500) Weight:  [173 lb 3.2 oz (78.6 kg)] 173 lb 3.2 oz (78.6 kg) (09/23 0710) Physical Exam: General: elderly AAF, well-appearing, NAD, lying in bed, pleasant Cardiovascular: Irregularly irregular rhythm, normal rate Respiratory: CTAB, normal work of breathing on RA Abdomen: soft, nontender Neuro: A&Ox3 Psych: Appropriate mood and affect  Laboratory:  Recent Labs Lab 06/18/16 1431 06/19/16 0509 06/20/16 0411  WBC 8.0 8.9 8.0  HGB 11.1* 11.3* 11.0*  HCT 34.4* 35.1* 33.1*  PLT 185 180 172    Recent Labs Lab 06/18/16 1431 06/19/16 0509  NA 139 138  K 3.4* 3.2*  CL 103 102  CO2 28 27  BUN 12 7  CREATININE 1.18* 0.87  CALCIUM 9.1 9.1  PROT  --  6.9  BILITOT  --  0.4  ALKPHOS  --  79  ALT  --  11*  AST  --  17  GLUCOSE 239* 213*   Trop neg x3 TSH 6.6 Free T4 0.76  Imaging/Diagnostic Tests: No results found.  Verner Mould, MD 06/20/2016, 11:18 AM PGY-2, [place] Intern pager: [phone], text pages welcome

## 2016-06-22 ENCOUNTER — Encounter: Payer: Self-pay | Admitting: Internal Medicine

## 2016-06-22 ENCOUNTER — Ambulatory Visit (INDEPENDENT_AMBULATORY_CARE_PROVIDER_SITE_OTHER): Payer: Commercial Managed Care - HMO | Admitting: Internal Medicine

## 2016-06-22 VITALS — BP 166/88 | HR 65 | Temp 98.1°F | Ht 63.0 in | Wt 178.6 lb

## 2016-06-22 DIAGNOSIS — I10 Essential (primary) hypertension: Secondary | ICD-10-CM

## 2016-06-22 DIAGNOSIS — IMO0001 Reserved for inherently not codable concepts without codable children: Secondary | ICD-10-CM

## 2016-06-22 DIAGNOSIS — H8110 Benign paroxysmal vertigo, unspecified ear: Secondary | ICD-10-CM

## 2016-06-22 DIAGNOSIS — E119 Type 2 diabetes mellitus without complications: Secondary | ICD-10-CM

## 2016-06-22 DIAGNOSIS — E785 Hyperlipidemia, unspecified: Secondary | ICD-10-CM

## 2016-06-22 DIAGNOSIS — Z23 Encounter for immunization: Secondary | ICD-10-CM | POA: Diagnosis not present

## 2016-06-22 DIAGNOSIS — I481 Persistent atrial fibrillation: Secondary | ICD-10-CM

## 2016-06-22 DIAGNOSIS — E039 Hypothyroidism, unspecified: Secondary | ICD-10-CM

## 2016-06-22 DIAGNOSIS — I4819 Other persistent atrial fibrillation: Secondary | ICD-10-CM

## 2016-06-22 DIAGNOSIS — E1165 Type 2 diabetes mellitus with hyperglycemia: Secondary | ICD-10-CM

## 2016-06-22 LAB — BASIC METABOLIC PANEL
BUN: 18 mg/dL (ref 7–25)
CALCIUM: 9.2 mg/dL (ref 8.6–10.4)
CO2: 31 mmol/L (ref 20–31)
Chloride: 101 mmol/L (ref 98–110)
Creat: 1.14 mg/dL — ABNORMAL HIGH (ref 0.60–0.93)
Glucose, Bld: 156 mg/dL — ABNORMAL HIGH (ref 65–99)
POTASSIUM: 3.7 mmol/L (ref 3.5–5.3)
SODIUM: 140 mmol/L (ref 135–146)

## 2016-06-22 LAB — POCT GLYCOSYLATED HEMOGLOBIN (HGB A1C): Hemoglobin A1C: 8.9

## 2016-06-22 MED ORDER — ATORVASTATIN CALCIUM 40 MG PO TABS
40.0000 mg | ORAL_TABLET | Freq: Every day | ORAL | 3 refills | Status: AC
Start: 2016-06-22 — End: ?

## 2016-06-22 MED ORDER — LEVOTHYROXINE SODIUM 50 MCG PO TABS: 50.0000 ug | ORAL_TABLET | Freq: Every day | ORAL | 0 refills | Status: DC

## 2016-06-22 NOTE — Assessment & Plan Note (Signed)
Last A1C in 2016 was 8.8. Will re-check A1C today. Awaiting Cr results from BMET ordered today, if within normal range will plan to start Metformin.

## 2016-06-22 NOTE — Assessment & Plan Note (Signed)
Re-started patient's Atorvastatin today. Most recent lipid panel was in 2015. Will plan for fasting lipid panel at next visit.

## 2016-06-22 NOTE — Assessment & Plan Note (Signed)
Rate controlled at OV. Patient to follow up with cardiology. Continue Eliquis.

## 2016-06-22 NOTE — Progress Notes (Signed)
Subjective:    Kristine Boyer - 75 y.o. female MRN [id number]  Date of birth: [id number]  HPI  Kristine Boyer is here for hospital follow up.  Hospital Follow-up: Patient presented to ED for vertigo symptoms and chest pain. Was found to be in new onset rate-controlled Afib. Chest pain thought to be likely to Afib as her ACS work up was negative. She was started on Eliquis for anticoagulation in setting of Afib, which patient reports she has been taking.  Vertigo symptoms seemed to be consistent with BPPV. PT evaluated patient and recommended outpatient vestibular rehab. Symptoms improved with Meclizine.   Today, patient denies chest pain. She has cardiology outpatient follow up arranged.  She reports dizziness that has improved since discharge. Taking Meclizine three times per day.    -  reports that she quit smoking about 4 years ago. She has never used smokeless tobacco. - Review of Systems: Per HPI. - Past Medical History: Patient Active Problem List   Diagnosis Date Noted  . Atrial fibrillation ([place]) 06/18/2016  . Chest pain 06/18/2016  . Angina pectoris, unspecified ([place]) 08/05/2015  . Scalp pain 01/16/2015  . CN (constipation) 12/17/2014  . Sciatica 10/08/2014  . Stiffness of neck 05/16/2014  . Mass of oropharynx 05/15/2014  . Dysuria 03/21/2014  . Abdominal pain 10/04/2013  . Flank pain 08/28/2013  . Dysphagia 08/28/2013  . Unspecified constipation 07/28/2013  . Forgetfulness 07/18/2013  . Leg swelling 02/14/2013  . Breast nodule 01/03/2013  . Musculoskeletal pain 08/16/2012  . Altered sensation, foot 04/26/2012  . Lung mass 08/05/2011  . OSA (obstructive sleep apnea) 06/12/2011  . Bulging disc 06/12/2011  . BPPV (benign paroxysmal positional vertigo) 05/18/2011  . Hypokalemia 03/18/2011  . De Quervain's syndrome (tenosynovitis) 02/23/2011  . Foot pain, right 11/11/2010  . BACK PAIN, LUMBAR 11/28/2009  . OBESITY 09/11/2008  . ALLERGIC RHINITIS 01/19/2008  .  Hypothyroidism 11/25/2006  . Diabetes mellitus type 2, uncontrolled ([place]) 11/25/2006  . Hyperlipidemia 11/25/2006  . HYPERTENSION, BENIGN SYSTEMIC 11/25/2006  . GASTROESOPHAGEAL REFLUX, NO ESOPHAGITIS 11/25/2006  . OSTEOARTHRITIS, MULTI SITES 11/25/2006   - Medications: reviewed and updated    Objective:   Physical Exam BP (!) 166/88   Pulse 65   Temp 98.1 F (36.7 C) (Oral)   Ht 5\' 3"  (1.6 m)   Wt 178 lb 9.6 oz (81 kg)   SpO2 98%   BMI 31.64 kg/m  Gen: NAD, alert, cooperative with exam, well-appearing CV: Irregularly irregular rhythm, normal rate. No murmur appreciated.  Resp: CTABL, no wheezes, non-labored     Assessment & Plan:   Hyperlipidemia Re-started patient's Atorvastatin today. Most recent lipid panel was in 2015. Will plan for fasting lipid panel at next visit.   Diabetes mellitus type 2, uncontrolled ([place]) Last A1C in 2016 was 8.8. Will re-check A1C today. Awaiting Cr results from BMET ordered today, if within normal range will plan to start Metformin.   Hypothyroidism Patient has been without Synthroid for several months. TSH in hospital elevated to >6. Will re-start Synthroid at lower dose of 50 mcg (patient previously on 175 mcg). Re-check TSH in 6 weeks and increase dose as needed.   HYPERTENSION, BENIGN SYSTEMIC Not controlled at visit today but patient recently re-started on all anti-hypertensive medications at recent hospitalization. Cr increased from 0.08 to 1.1 in last 24 hours of hospitalization; may have been related to addition of Arb therapy. Will check BMET today. Patient to return in 1-2 weeks for close follow up  of chronic medical conditions. If BP still uncontrolled at that visit, may need to adjust therapy. For now will elect to continue current regimen of Metoprolol, HCTZ, and Losartan as patient is asymptomatic without red flags.   Atrial fibrillation ([place]) Rate controlled at OV. Patient to follow up with cardiology. Continue Eliquis.     Phill Myron, D.O. 06/22/2016, 5:03 PM PGY-2, [place]

## 2016-06-22 NOTE — Patient Instructions (Signed)
We will check your electrolytes and kidney function today. We will also check a Hemoglobin A1C (a measure of your blood sugar over time). I will call you with results and will likely plan to start a diabetes medication based on these results.   You will be getting a call about physical therapy for your dizziness.   I have re-ordered your Synthroid for your thyroid and your Atorvastatin for cholesterol. Please start taking a baby Aspirin (81 mg).   Please make an appointment to see Dr. Juleen China within the next 1-2 weeks for follow up of your chronic medical conditions.

## 2016-06-22 NOTE — Assessment & Plan Note (Signed)
Patient has been without Synthroid for several months. TSH in hospital elevated to >6. Will re-start Synthroid at lower dose of 50 mcg (patient previously on 175 mcg). Re-check TSH in 6 weeks and increase dose as needed.

## 2016-06-22 NOTE — Assessment & Plan Note (Signed)
Not controlled at visit today but patient recently re-started on all anti-hypertensive medications at recent hospitalization. Cr increased from 0.08 to 1.1 in last 24 hours of hospitalization; may have been related to addition of Arb therapy. Will check BMET today. Patient to return in 1-2 weeks for close follow up of chronic medical conditions. If BP still uncontrolled at that visit, may need to adjust therapy. For now will elect to continue current regimen of Metoprolol, HCTZ, and Losartan as patient is asymptomatic without red flags.

## 2016-07-01 ENCOUNTER — Encounter: Payer: Self-pay | Admitting: Physical Therapy

## 2016-07-01 ENCOUNTER — Other Ambulatory Visit: Payer: Self-pay | Admitting: Internal Medicine

## 2016-07-01 ENCOUNTER — Ambulatory Visit: Payer: Commercial Managed Care - HMO | Attending: Family Medicine | Admitting: Physical Therapy

## 2016-07-01 DIAGNOSIS — R42 Dizziness and giddiness: Secondary | ICD-10-CM | POA: Insufficient documentation

## 2016-07-01 DIAGNOSIS — R2689 Other abnormalities of gait and mobility: Secondary | ICD-10-CM | POA: Insufficient documentation

## 2016-07-01 DIAGNOSIS — R293 Abnormal posture: Secondary | ICD-10-CM | POA: Insufficient documentation

## 2016-07-01 DIAGNOSIS — H8111 Benign paroxysmal vertigo, right ear: Secondary | ICD-10-CM | POA: Insufficient documentation

## 2016-07-01 DIAGNOSIS — R2681 Unsteadiness on feet: Secondary | ICD-10-CM | POA: Diagnosis not present

## 2016-07-01 DIAGNOSIS — M542 Cervicalgia: Secondary | ICD-10-CM | POA: Insufficient documentation

## 2016-07-01 NOTE — Patient Instructions (Addendum)
Kristine Boyer,  Try to sleep on your RIGHT side. Use a rolling walker at all times until directed otherwise by physical therapist.  Tip Card 1.The goal of habituation training is to assist in decreasing symptoms of vertigo, dizziness, or nausea provoked by specific head and body motions. 2.These exercises may initially increase symptoms; however, be persistent and work through symptoms. With repetition and time, the exercises will assist in reducing or eliminating symptoms. 3.Exercises should be stopped and discussed with the therapist if you experience any of the following: - Sudden change or fluctuation in hearing - New onset of ringing in the ears, or increase in current intensity - Any fluid discharge from the ear - Severe pain in neck or back - Extreme nausea  Copyright  [employer]. All rights reserved.  Rolling   With pillow under head, start on back. Roll quickly to your right side.  Hold until dizziness stops, plus 20 seconds and then roll quickly to the left side.  Hold until dizziness stops, plus 20 seconds.  Repeat sequence 5 times per session. Do 2 sessions per day.

## 2016-07-01 NOTE — Therapy (Signed)
[place] [address] [place], [place], [address] Phone: [phone]   Fax:  [phone]  Physical Therapy Evaluation  Patient Details  Name: Kristine Boyer MRN: [id number] Date of Birth: [birth year] Referring Provider: Nicolette Bang, DO (send documentation to preceptor, Zenia Resides, MD)  Encounter Date: 07/01/2016      PT End of Session - 07/01/16 1812    Visit Number 1   Number of Visits 5  eval + 4 visits   Date for PT Re-Evaluation 07/31/16   Authorization Type [employer]   PT Start Time [phone]   PT Stop Time 0935   PT Time Calculation (min) 46 min   Activity Tolerance Patient tolerated treatment well;Patient limited by pain  discomfort in cervical spine   Behavior During Therapy [facility] for tasks assessed/performed      Past Medical History:  Diagnosis Date  . Adenomatous colon polyp   . Allergy   . Alzheimer disease   . Anemia   . Bipolar affective ([place])   . Blood transfusion without reported diagnosis   . Cataract   . Diabetes mellitus   . GERD (gastroesophageal reflux disease)   . Hyperlipidemia   . Hypertension   . Hypothyroidism   . Immune deficiency disorder ([place])   . Internal hemorrhoids   . Seizures ([place])     Past Surgical History:  Procedure Laterality Date  . ABDOMINAL HYSTERECTOMY    . TONSILECTOMY, ADENOIDECTOMY, BILATERAL MYRINGOTOMY AND TUBES      There were no vitals filed for this visit.       Subjective Assessment - 07/01/16 0856    Subjective Onset of room-spinning vertigo on 06/18/16. Hospitalized from 9/21 - 06/20/16. Acute care PT noted ageotropic horizontal nystagmus in R sidelying and performed CRM.   Patient is accompained by: Family member  son, [name]   Pertinent History PMH significant for: Alzheimer's, bipolar affective disorder, A-Fib, cataracts, DM, GERD, HTN, HLD, hypothyroidism, immune deficiency, seizures, OA (multiple sites), llumbar bulging  disc.    Patient Stated Goals "I want to be well."   Currently in Pain? No/denies  No pain at this time; but does describe neck pain, at times, with AROM            [facility] PT Assessment - 07/01/16 0001      Assessment   Medical Diagnosis Benign paroxysmal positional vertigo, unspecified laterality   Referring Provider Nicolette Bang, DO  send documentation to preceptor, Zenia Resides, MD   Onset Date/Surgical Date 06/18/16     Precautions   Precautions Fall     Balance Screen   Has the patient fallen in the past 6 months No   Has the patient had a decrease in activity level because of a fear of falling?  Yes   Is the patient reluctant to leave their home because of a fear of falling?  Yes     [place] Private residence   [place]  lives with son and daughter-in-law   Type of [place] to enter   Entrance Stairs-Number of Steps 5   Entrance Stairs-Rails Can reach both   [place] Two level   Alternate Level Stairs-Number of Steps 13  pt never uses stairs; doesn't live on second level   Research scientist (life sciences) - 2 wheels   Additional Comments Son home with pt during the day when he works nights; daughter-in-law and daughter  with patient at all times when son at work     Prior Function   Level of Independence Requires assistive device for independence;Needs assistance with gait;Needs assistance with transfers;Needs assistance with ADLs;Needs assistance with homemaking   Vocation Retired     Cognition   Overall Cognitive Status History of cognitive impairments - at baseline  Alzheimer's disease, per chart            Vestibular Assessment - 07/01/16 0001      Vestibular Assessment   General Observation Episodic vertigo for years now; onset of acute vertiginous episode around 06/18/16. Was hospitalized and treated by acute PT for horizontal BPPV.  took meclizine this morning      Symptom Behavior   Type of Dizziness Spinning  and imbalance   Frequency of Dizziness daily   Duration of Dizziness pt has difficulty describing   Aggravating Factors Turning head quickly;Turning body quickly   Relieving Factors Medication     Occulomotor Exam   Occulomotor Alignment Normal   Spontaneous Absent   Gaze-induced Absent   Smooth Pursuits Intact   Saccades Intact   Comment Unable to perform Head Thrust Test due to muscle guarding in cervical spine     Vestibulo-Occular Reflex   VOR 1 Head Only (x 1 viewing) unable to formally assess due to cervical spine muscle guarding     Positional Testing   Sidelying Test Sidelying Right;Sidelying Left   Horizontal Canal Testing Horizontal Canal Right;Horizontal Canal Left;Horizontal Canal Right Intensity;Horizontal Canal Left Intensity     Sidelying Right   Sidelying Right Duration NA  Modified due to limited c-spine AROM   Sidelying Right Symptoms No nystagmus     Sidelying Left   Sidelying Left Duration NA  Modified due to limited c-spine AROM   Sidelying Left Symptoms No nystagmus     Horizontal Canal Right   Horizontal Canal Right Duration 8 sec   Horizontal Canal Right Symptoms Ageotrophic;Nystagmus     Horizontal Canal Left   Horizontal Canal Left Duration 12 sec   Horizontal Canal Left Symptoms Ageotrophic;Nystagmus     Horizontal Canal Right Intensity   Horizontal Canal Right Intensity Mild     Horizontal Canal Left Intensity   Horizontal Canal Left Intensity Moderate               [facility] Adult PT Treatment/Exercise - 07/01/16 0001      Bed Mobility   Bed Mobility Rolling Right;Rolling Left;Supine to Sit;Sit to Supine   Rolling Right 6: Modified independent (Device/Increase time)   Rolling Left 6: Modified independent (Device/Increase time)   Supine to Sit 5: Supervision   Sit to Supine 5: Supervision     Transfers   Transfers Sit to Stand;Stand to Sit   Sit to Stand 5: Supervision   Stand to  Sit 5: Supervision     Ambulation/Gait   Ambulation/Gait Yes   Ambulation/Gait Assistance 4: Min assist   Ambulation/Gait Assistance Details Due to pt tendency to hold onto walls, provided min A, HHA during all ambulation.   Ambulation Distance (Feet) 75 Feet  x2   Assistive device None   Gait Pattern Step-through pattern;Decreased arm swing - right;Decreased arm swing - left;Decreased stride length;Wide base of support  BUE's in high guard position   Ambulation Surface Level;Indoor         Vestibular Treatment/Exercise - 07/01/16 0001      Vestibular Treatment/Exercise   Vestibular Treatment Provided Canalith Repositioning   Canalith Repositioning Comment   Habituation  Exercises Horizontal Roll     OTHER   Comment Performed Gufoni manuever by starting on the right side, and then proceeding to 45 degrees of cervical spine rotation to L (compensated for ROM limitation by rolling entire body to L) then holding x1 minute. Performed this maneuver x2 trials. Reasessment of horizontal canals reveals grossly unchanged ageotropic nystagmus on L > R.     Horizontal Roll   Number of Reps  1   Symptom Description  With verbal/tactile cueing from PT, pt effectively perform fast rolling x1 rep per direction.               PT Education - 07/01/16 1801    Education provided Yes   Education Details PT eval findings, goals, and POC. Explained treatment and initiated HEP for habituation. Education provided on prolonged positioning. Recommended pt use RW at all times to prevent falls.   Person(s) Educated Patient;Child(ren)   Methods Explanation;Demonstration;Verbal cues;Handout   Comprehension Verbalized understanding;Returned demonstration          PT Short Term Goals - 07/01/16 1822      PT SHORT TERM GOAL #1   Title STG's = LTG's           PT Long Term Goals - 07/01/16 1822      PT LONG TERM GOAL #1   Title Positional vertigo testing will be negative to indicate  resolved BPPV.  (Target: 07/29/16)     PT LONG TERM GOAL #2   Title Pt will perform vestibular HEP with assistance of caregiver to maximize functional gains made in PT.  (07/29/16)     PT LONG TERM GOAL #3   Title Pt will decrease DHI score from 54 to < / = 36 to indicate significant decrease in pt-perceived disability due to dizziness.  (07/29/16)     PT LONG TERM GOAL #4   Title Assess strength and balance, if indicated, after vertigo clears.  (07/29/16)               Plan - 07/01/16 1813    Clinical Impression Statement Pt is a 75 y/o F referred to outpatient PT to address onset of vertigo on 06/18/16. During hospitalization from 9/21 - 06/20/16, acute care PT noted ageotropic horizontal nystagmus in sidelying. PMH significant for: Alzheimer's, bipolar affective disorder, A-Fib, cataracts, DM, GERD, HTN, HLD, hypothyroidism, immune deficiency, seizures, OA (multiple sites), llumbar bulging disc. This PT evaluation reveals the following: gait impairments, and horizontal canal testing with ageotropic nystagmus with severity of symptoms/nystagmus on L > R. VOR testing limited by pt difficulty with active/passive neck movement due to stiffness and discomfort (associated with OA, per pt/son). Based on findings, unable to rule out R horizontal canal cupulolithiasis. Performed R Gufoni maneuver x2 trials. Reassessment of horizontal canals grossly unchanged (minimal decrease in duration of nystagmus bilaterally).  Recommending vestibular PT 2x/week for 2 weeks for 4 weeks. However, pt able to attend only 1x/week due to high insurance co-pay.    Rehab Potential Fair   Clinical Impairments Affecting Rehab Potential Alzheimer's disease, financial limitations (high insurance co-pay)   PT Frequency 1x / week   PT Duration 4 weeks   PT Treatment/Interventions ADLs/Self Care Home Management;Canalith Repostioning;Vestibular;DME Instruction;Gait training;Stair training;Functional mobility training;Therapeutic  activities;Patient/family education;Neuromuscular re-education;Balance training;Therapeutic exercise   PT Next Visit Plan Reassess for BPPV [facility]) and treat prn. Expand on vestibular HEP, as appropriate.   PT Home Exercise Plan fast rolling for HC habituation   Consulted and Agree with Plan of  Care Patient;Family member/caregiver   Family Member Consulted son, [name]      Patient will benefit from skilled therapeutic intervention in order to improve the following deficits and impairments:  Abnormal gait, Decreased balance, Dizziness, Decreased safety awareness, Impaired perceived functional ability  Visit Diagnosis: BPPV (benign paroxysmal positional vertigo), right - Plan: PT plan of care cert/re-cert  Dizziness and giddiness - Plan: PT plan of care cert/re-cert  Other abnormalities of gait and mobility - Plan: PT plan of care cert/re-cert  Unsteadiness on feet - Plan: PT plan of care cert/re-cert      G-Codes - [id number] 1825    Functional Assessment Tool Used DHI = 54   Functional Limitation Self care   Self Care Current Status [id number]) At least 40 percent but less than 60 percent impaired, limited or restricted   Self Care Goal Status [id number]) At least 20 percent but less than 40 percent impaired, limited or restricted       Problem List Patient Active Problem List   Diagnosis Date Noted  . Atrial fibrillation ([place]) 06/18/2016  . Chest pain 06/18/2016  . Angina pectoris, unspecified 08/05/2015  . Scalp pain 01/16/2015  . CN (constipation) 12/17/2014  . Sciatica 10/08/2014  . Stiffness of neck 05/16/2014  . Mass of oropharynx 05/15/2014  . Dysuria 03/21/2014  . Abdominal pain 10/04/2013  . Flank pain 08/28/2013  . Dysphagia 08/28/2013  . Unspecified constipation 07/28/2013  . Forgetfulness 07/18/2013  . Leg swelling 02/14/2013  . Breast nodule 01/03/2013  . Musculoskeletal pain 08/16/2012  . Altered sensation, foot 04/26/2012  . Lung mass 08/05/2011  . OSA  (obstructive sleep apnea) 06/12/2011  . Bulging disc 06/12/2011  . BPPV (benign paroxysmal positional vertigo) 05/18/2011  . Hypokalemia 03/18/2011  . De Quervain's syndrome (tenosynovitis) 02/23/2011  . Foot pain, right 11/11/2010  . BACK PAIN, LUMBAR 11/28/2009  . OBESITY 09/11/2008  . ALLERGIC RHINITIS 01/19/2008  . Hypothyroidism 11/25/2006  . Diabetes mellitus type 2, uncontrolled ([place]) 11/25/2006  . Hyperlipidemia 11/25/2006  . HYPERTENSION, BENIGN SYSTEMIC 11/25/2006  . GASTROESOPHAGEAL REFLUX, NO ESOPHAGITIS 11/25/2006  . Candiss Norse SITES 11/25/2006   Billie Ruddy, PT, DPT [facility] [address] [place], [place], [address] Phone: [phone]   Fax:  [phone] 07/01/16, [id number] PM  Name: PACIENCE LAVERE MRN: [id number] Date of Birth: 04-18-1941

## 2016-07-03 ENCOUNTER — Encounter: Payer: Self-pay | Admitting: Internal Medicine

## 2016-07-03 ENCOUNTER — Ambulatory Visit (INDEPENDENT_AMBULATORY_CARE_PROVIDER_SITE_OTHER): Payer: Commercial Managed Care - HMO | Admitting: Internal Medicine

## 2016-07-03 DIAGNOSIS — E1165 Type 2 diabetes mellitus with hyperglycemia: Secondary | ICD-10-CM

## 2016-07-03 DIAGNOSIS — I10 Essential (primary) hypertension: Secondary | ICD-10-CM

## 2016-07-03 DIAGNOSIS — IMO0001 Reserved for inherently not codable concepts without codable children: Secondary | ICD-10-CM

## 2016-07-03 MED ORDER — HYDROCHLOROTHIAZIDE 25 MG PO TABS
25.0000 mg | ORAL_TABLET | Freq: Every day | ORAL | 0 refills | Status: DC
Start: 2016-07-03 — End: 2016-08-24

## 2016-07-03 MED ORDER — GLIPIZIDE 5 MG PO TABS: 5.0000 mg | ORAL_TABLET | Freq: Every day | ORAL | 3 refills | Status: DC

## 2016-07-03 MED ORDER — MECLIZINE HCL 25 MG PO TABS: 25.0000 mg | ORAL_TABLET | Freq: Three times a day (TID) | ORAL | 0 refills | Status: DC | PRN

## 2016-07-03 NOTE — Assessment & Plan Note (Signed)
Not at goal of <140/90. Cr bumped from 0.8 to 1.1 likely due to re-initiation of Arb therapy.  -increase HCTZ to 25 mg daily  -continue Metoprolol and Losartan  -repeat BMET in 1-2 months at follow up

## 2016-07-03 NOTE — Patient Instructions (Signed)
I have increased your HCTZ dose to 25 mg daily. If you have left over medication at home you can take two pills at once to equal that dosage.   I have also ordered Glipizide for your diabetes.   Continue to do your PT exercises at home for dizziness.

## 2016-07-03 NOTE — Progress Notes (Signed)
   Subjective:    IJAHNAE GUGEL - 75 y.o. female MRN [id number]  Date of birth: [id number]  HPI  COURTENEY INTERRANTE is here for follow up of chronic medical conditions.  T2DM: Reports that she has had problems with significant diarrhea on Metformin causing her to stop taking that medication in the past. Does not check CBGs at home.   HTN: Compliant with medications. Does not monitor BP at home. Denies headache, LE edema, SOB and chest pain.    -  reports that she quit smoking about 4 years ago. She has never used smokeless tobacco. - Review of Systems: Per HPI. - Past Medical History: Patient Active Problem List   Diagnosis Date Noted  . Atrial fibrillation ([place]) 06/18/2016  . Chest pain 06/18/2016  . Angina pectoris, unspecified 08/05/2015  . Scalp pain 01/16/2015  . CN (constipation) 12/17/2014  . Sciatica 10/08/2014  . Stiffness of neck 05/16/2014  . Mass of oropharynx 05/15/2014  . Dysuria 03/21/2014  . Abdominal pain 10/04/2013  . Flank pain 08/28/2013  . Dysphagia 08/28/2013  . Unspecified constipation 07/28/2013  . Forgetfulness 07/18/2013  . Leg swelling 02/14/2013  . Breast nodule 01/03/2013  . Musculoskeletal pain 08/16/2012  . Altered sensation, foot 04/26/2012  . Lung mass 08/05/2011  . OSA (obstructive sleep apnea) 06/12/2011  . Bulging disc 06/12/2011  . BPPV (benign paroxysmal positional vertigo) 05/18/2011  . Hypokalemia 03/18/2011  . De Quervain's syndrome (tenosynovitis) 02/23/2011  . Foot pain, right 11/11/2010  . BACK PAIN, LUMBAR 11/28/2009  . OBESITY 09/11/2008  . ALLERGIC RHINITIS 01/19/2008  . Hypothyroidism 11/25/2006  . Diabetes mellitus type 2, uncontrolled ([place]) 11/25/2006  . Hyperlipidemia 11/25/2006  . HYPERTENSION, BENIGN SYSTEMIC 11/25/2006  . GASTROESOPHAGEAL REFLUX, NO ESOPHAGITIS 11/25/2006  . OSTEOARTHRITIS, MULTI SITES 11/25/2006   - Medications: reviewed and updated    Objective:   Physical Exam BP (!) 168/80 (BP Location:  Right Arm, Patient Position: Sitting, Cuff Size: Normal)   Pulse 86   Temp 97.9 F (36.6 C) (Oral)   Ht 5' 2.5" (1.588 m)   Wt 178 lb 3.2 oz (80.8 kg)   SpO2 97%   BMI 32.07 kg/m  Gen: NAD, alert, cooperative with exam, well-appearing CV: irregularly irregular rhythm, normal rate, no murmur   Resp: CTABL, no wheezes, non-labored    Assessment & Plan:   HYPERTENSION, BENIGN SYSTEMIC Not at goal of <140/90. Cr bumped from 0.8 to 1.1 likely due to re-initiation of Arb therapy.  -increase HCTZ to 25 mg daily  -continue Metoprolol and Losartan  -repeat BMET in 1-2 months at follow up   Diabetes mellitus type 2, uncontrolled ([place]) Uncontrolled with most recent A1C of 8.9. CrCl calculated to be 55.  -glypizide 5 mg daily, increase as needed (does not need renally adjusted dosing given CrCl >50)  -counseled that this medication can cause hypoglycemia, recommended monitoring CBGs for a couple weeks after initiation of therapy and reviewed symptoms of hypoglycemia and management  -repeat A1C in December     Catherine Wallace, D.O. 07/03/2016, 12:08 PM PGY-2, [place]

## 2016-07-03 NOTE — Assessment & Plan Note (Addendum)
Uncontrolled with most recent A1C of 8.9. CrCl calculated to be 55.  -glypizide 5 mg daily, increase as needed (does not need renally adjusted dosing given CrCl >50)  -counseled that this medication can cause hypoglycemia, recommended monitoring CBGs for a couple weeks after initiation of therapy and reviewed symptoms of hypoglycemia and management  -repeat A1C in December

## 2016-07-06 ENCOUNTER — Ambulatory Visit: Payer: Commercial Managed Care - HMO | Admitting: Rehabilitative and Restorative Service Providers"

## 2016-07-06 DIAGNOSIS — H8111 Benign paroxysmal vertigo, right ear: Secondary | ICD-10-CM | POA: Diagnosis not present

## 2016-07-06 DIAGNOSIS — R42 Dizziness and giddiness: Secondary | ICD-10-CM | POA: Diagnosis not present

## 2016-07-06 DIAGNOSIS — R2681 Unsteadiness on feet: Secondary | ICD-10-CM | POA: Diagnosis not present

## 2016-07-06 DIAGNOSIS — R293 Abnormal posture: Secondary | ICD-10-CM | POA: Diagnosis not present

## 2016-07-06 DIAGNOSIS — R2689 Other abnormalities of gait and mobility: Secondary | ICD-10-CM

## 2016-07-06 DIAGNOSIS — M542 Cervicalgia: Secondary | ICD-10-CM | POA: Diagnosis not present

## 2016-07-06 NOTE — Therapy (Signed)
[facility] [address] [place], [place], [address] Phone: [phone]   Fax:  [phone]  Physical Therapy Treatment  Patient Details  Name: Kristine Boyer MRN: [id number] Date of Birth: 09/27/1941 Referring Provider: Arvilla Market, DO (send documentation to preceptor, Moses Manners, MD)  Encounter Date: 07/06/2016      PT End of Session - 07/06/16 1210    Visit Number 2   Number of Visits 5  eval + 4 visits   Date for PT Re-Evaluation 07/31/16   Authorization Type [employer]   PT Start Time 1150   PT Stop Time 1230   PT Time Calculation (min) 40 min   Activity Tolerance Patient tolerated treatment well;Patient limited by pain  discomfort in cervical spine   Behavior During Therapy [facility] for tasks assessed/performed      Past Medical History:  Diagnosis Date  . Adenomatous colon polyp   . Allergy   . Alzheimer disease   . Anemia   . Bipolar affective (HCC)   . Blood transfusion without reported diagnosis   . Cataract   . Diabetes mellitus   . GERD (gastroesophageal reflux disease)   . Hyperlipidemia   . Hypertension   . Hypothyroidism   . Immune deficiency disorder (HCC)   . Internal hemorrhoids   . Seizures (HCC)     Past Surgical History:  Procedure Laterality Date  . ABDOMINAL HYSTERECTOMY    . TONSILECTOMY, ADENOIDECTOMY, BILATERAL MYRINGOTOMY AND TUBES      There were no vitals filed for this visit.      Subjective Assessment - 07/06/16 1159    Subjective The patient reports she was not dizzy yesterday, but notices it today.  She is not doing home exercises regularly and hasn't used prolonged positioning.  Her son has not been able to help with exercises due to working nights.    Patient is accompained by: Family member  son, Felipa Eth   Pertinent History PMH significant for: Alzheimer's, bipolar affective disorder, A-Fib, cataracts, DM, GERD, HTN, HLD, hypothyroidism, immune  deficiency, seizures, OA (multiple sites), llumbar bulging disc.    Patient Stated Goals "I want to be well."   Currently in Pain? No/denies                Vestibular Assessment - 07/06/16 1211      Vestibular Assessment   General Observation vertigo still present per patient     Positional Testing   Sidelying Test Sidelying Right;Sidelying Left   Horizontal Canal Testing Horizontal Canal Right;Horizontal Canal Left     Sidelying Right   Sidelying Right Duration none   Sidelying Right Symptoms No nystagmus     Sidelying Left   Sidelying Left Duration none   Sidelying Left Symptoms No nystagmus     Horizontal Canal Right   Horizontal Canal Right Duration 10 second duration   Horizontal Canal Right Symptoms Ageotrophic     Horizontal Canal Left   Horizontal Canal Left Duration 14 second duration   Horizontal Canal Left Symptoms Ageotrophic     Horizontal Canal Left Intensity   Left Intensity Comment patient reports overall feeling symptoms are less intense                 [facility] Adult PT Treatment/Exercise - 07/06/16 1200      Self-Care   Self-Care Other Self-Care Comments   Other Self-Care Comments  Discussed need for assistance performing home exercises of rolling, neck ROM.  PT and  patient/family also discussed that patient sleeps sitting up and may benefit from the movement of getting into/out of bed and this may provide habituation of dizziness.       Exercises   Exercises Other Exercises   Other Exercises  Seated neck A/ROM into rotation and flexion/extension.  Patient significantly limited with all neck movement.  PT provided tactile cues to hold shoulders still and patient's son also helped with verbal cues for improving general neck mobility.           Vestibular Treatment/Exercise - 07/06/16 1200      Vestibular Treatment/Exercise   Vestibular Treatment Provided Canalith Repositioning;Habituation;Gaze   Canalith Repositioning Comment    Habituation Exercises Horizontal Roll   Gaze Exercises X1 Viewing Horizontal     OTHER   Comment Performed Kim 2011 maneuver with vibration on R mastoid during 1st position (on R side with neck rotated to mat), and at 4th position when in L sidelying x 20 seconds.     Horizontal Roll   Number of Reps  5   Symptom Description  With verbal cues and tactile cues.      X1 Viewing Horizontal   Foot Position seated               PT Education - 07/06/16 1943    Education provided Yes   Education Details HEP: range of motion neck exercises for rotation and flexion/extension to improve general mobility.   Person(s) Educated Patient   Methods Explanation;Demonstration;Handout   Comprehension Verbalized understanding;Returned demonstration          PT Short Term Goals - 07/01/16 1822      PT SHORT TERM GOAL #1   Title STG's = LTG's           PT Long Term Goals - 07/01/16 1822      PT LONG TERM GOAL #1   Title Positional vertigo testing will be negative to indicate resolved BPPV.  (Target: 07/29/16)     PT LONG TERM GOAL #2   Title Pt will perform vestibular HEP with assistance of caregiver to maximize functional gains made in PT.  (07/29/16)     PT LONG TERM GOAL #3   Title Pt will decrease DHI score from 54 to < / = 36 to indicate significant decrease in pt-perceived disability due to dizziness.  (07/29/16)     PT LONG TERM GOAL #4   Title Assess strength and balance, if indicated, after vertigo clears.  (07/29/16)               Plan - 07/06/16 1959    Clinical Impression Statement The patient continues with L>R apogeotropic nystagmus observed in room light.  She c/o neck discomfort t/o canolith repositioning maneuver.  Patient's limited neck mobility may hinder self habituation of dizziness as she has a guarded neck position.   Continue working to [employer].    PT Treatment/Interventions ADLs/Self Care Home Management;Canalith Repostioning;Vestibular;DME  Instruction;Gait training;Stair training;Functional mobility training;Therapeutic activities;Patient/family education;Neuromuscular re-education;Balance training;Therapeutic exercise   PT Next Visit Plan Reassess for BPPV, check current HEP (has not performed home program at this point due to needing further family assist), progress vestibular HEP, balance/gait/safety during mobility.    Consulted and Agree with Plan of Care Patient;Family member/caregiver   Family Member Consulted son, Harriett Sine      Patient will benefit from skilled therapeutic intervention in order to improve the following deficits and impairments:  Abnormal gait, Decreased balance, Dizziness, Decreased safety awareness, Impaired perceived functional ability  Visit Diagnosis: BPPV (benign paroxysmal positional vertigo), right  Dizziness and giddiness  Other abnormalities of gait and mobility  Unsteadiness on feet     Problem List Patient Active Problem List   Diagnosis Date Noted  . Atrial fibrillation (HCC) 06/18/2016  . Chest pain 06/18/2016  . Angina pectoris, unspecified 08/05/2015  . Scalp pain 01/16/2015  . CN (constipation) 12/17/2014  . Sciatica 10/08/2014  . Stiffness of neck 05/16/2014  . Mass of oropharynx 05/15/2014  . Dysuria 03/21/2014  . Abdominal pain 10/04/2013  . Flank pain 08/28/2013  . Dysphagia 08/28/2013  . Unspecified constipation 07/28/2013  . Forgetfulness 07/18/2013  . Leg swelling 02/14/2013  . Breast nodule 01/03/2013  . Musculoskeletal pain 08/16/2012  . Altered sensation, foot 04/26/2012  . Lung mass 08/05/2011  . OSA (obstructive sleep apnea) 06/12/2011  . Bulging disc 06/12/2011  . BPPV (benign paroxysmal positional vertigo) 05/18/2011  . Hypokalemia 03/18/2011  . De Quervain's syndrome (tenosynovitis) 02/23/2011  . Foot pain, right 11/11/2010  . BACK PAIN, LUMBAR 11/28/2009  . OBESITY 09/11/2008  . ALLERGIC RHINITIS 01/19/2008  . Hypothyroidism 11/25/2006  .  Diabetes mellitus type 2, uncontrolled (HCC) 11/25/2006  . Hyperlipidemia 11/25/2006  . HYPERTENSION, BENIGN SYSTEMIC 11/25/2006  . GASTROESOPHAGEAL REFLUX, NO ESOPHAGITIS 11/25/2006  . Youlanda Mighty SITES 11/25/2006    Corday Wyka, PT 07/06/2016, 8:05 PM  [facility] [address] [place], [place], [address] Phone: [phone]   Fax:  [phone]  Name: Kristine Boyer MRN: [id number] Date of Birth: 1941/02/28

## 2016-07-06 NOTE — Patient Instructions (Signed)
AROM: Neck Rotation    Turn head slowly to look over one shoulder, then the other. Hold each position __5__ seconds. Repeat __5-10__ times per set. Do __2__ sessions per day.  http://orth.exer.us/295   Copyright  VHI. All rights reserved.  AROM: Neck Flexion    Bend head forward. Hold _5___ seconds. Repeat __5-10__ times per set. Do __2__ sessions per day.  http://orth.exer.us/299   Copyright  VHI. All rights reserved.  AROM: Neck Extension    Bend head backward. Hold __5__ seconds. Repeat __5-10__ times per set. Do __2__ sessions per day.  http://orth.exer.us/301   Copyright  VHI. All rights reserved.

## 2016-07-16 ENCOUNTER — Ambulatory Visit: Payer: Commercial Managed Care - HMO | Admitting: Physical Therapy

## 2016-07-16 DIAGNOSIS — R293 Abnormal posture: Secondary | ICD-10-CM | POA: Diagnosis not present

## 2016-07-16 DIAGNOSIS — H8111 Benign paroxysmal vertigo, right ear: Secondary | ICD-10-CM | POA: Diagnosis not present

## 2016-07-16 DIAGNOSIS — M542 Cervicalgia: Secondary | ICD-10-CM

## 2016-07-16 DIAGNOSIS — R2689 Other abnormalities of gait and mobility: Secondary | ICD-10-CM | POA: Diagnosis not present

## 2016-07-16 DIAGNOSIS — R2681 Unsteadiness on feet: Secondary | ICD-10-CM

## 2016-07-16 DIAGNOSIS — R42 Dizziness and giddiness: Secondary | ICD-10-CM | POA: Diagnosis not present

## 2016-07-16 NOTE — Patient Instructions (Signed)
AROM: Neck Rotation    Turn head slowly to look over one shoulder, then the other. Hold each position __5__ seconds. Repeat __5-10__ times per set. Do __2__ sessions per day.  http://orth.exer.us/295   Copyright  VHI. All rights reserved.  AROM: Neck Flexion    Bend head forward. Hold _5___ seconds. Repeat __5-10__ times per set. Do __2__ sessions per day.  http://orth.exer.us/299   Copyright  VHI. All rights reserved.  AROM: Neck Extension    Bend head backward. Hold __5__ seconds. Repeat __5-10__ times per set. Do __2__ sessions per day.  http://orth.exer.us/301   Copyright  VHI. All rights reserved.

## 2016-07-16 NOTE — Therapy (Signed)
[facility] [address] [place], [place], [address] Phone: [phone]   Fax:  [phone]  Physical Therapy Treatment  Patient Details  Name: Kristine Boyer MRN: [id number] Date of Birth: [birth year] Referring Provider: Zenia Resides, MD  Encounter Date: 07/16/2016      PT End of Session - 07/16/16 0926    Visit Number 3   Number of Visits 7  requesting up to 4 additional sessions afer this date   Date for PT Re-Evaluation 08/15/16   Authorization Type [employer]   PT Start Time 0807   PT Stop Time 0847   PT Time Calculation (min) 40 min   Activity Tolerance Patient tolerated treatment well;Patient limited by pain  discomfort in cervical spine during AROM   Behavior During Therapy [facility] for tasks assessed/performed      Past Medical History:  Diagnosis Date  . Adenomatous colon polyp   . Allergy   . Alzheimer disease   . Anemia   . Bipolar affective ([place])   . Blood transfusion without reported diagnosis   . Cataract   . Diabetes mellitus   . GERD (gastroesophageal reflux disease)   . Hyperlipidemia   . Hypertension   . Hypothyroidism   . Immune deficiency disorder ([place])   . Internal hemorrhoids   . Seizures ([place])     Past Surgical History:  Procedure Laterality Date  . ABDOMINAL HYSTERECTOMY    . TONSILECTOMY, ADENOIDECTOMY, BILATERAL MYRINGOTOMY AND TUBES      There were no vitals filed for this visit.      Subjective Assessment - 07/16/16 0810    Subjective "I feel so much better.Marland KitchenMarland KitchenI've been twisting my neck and everything. I haven't been doing the rolling as much."   Patient is accompained by: Family member  son, [name]   Pertinent History PMH significant for: Alzheimer's, bipolar affective disorder, A-Fib, cataracts, DM, GERD, HTN, HLD, hypothyroidism, immune deficiency, seizures, OA (multiple sites), llumbar bulging disc.    Patient Stated Goals "I want to be well."   Currently in  Pain? No/denies            [facility] PT Assessment - 07/16/16 0001      Assessment   Medical Diagnosis Benign paroxysmal positional vertigo, unspecified laterality   Referring Provider Zenia Resides, MD   Onset Date/Surgical Date 06/18/16     Prior Function   Level of Independence Requires assistive device for independence;Needs assistance with gait;Needs assistance with transfers;Needs assistance with ADLs;Needs assistance with homemaking   Vocation Retired            Vestibular Assessment - 07/16/16 0001      Vestibular Assessment   General Observation Pt reports no dizziness with rolling in bed.     Symptom Behavior   Type of Dizziness Imbalance     Positional Testing   Sidelying Test Sidelying Right;Sidelying Left   Horizontal Canal Testing Horizontal Canal Right;Horizontal Canal Left     Sidelying Right   Sidelying Right Duration NA   Sidelying Right Symptoms No nystagmus     Sidelying Left   Sidelying Left Duration NA   Sidelying Left Symptoms No nystagmus     Horizontal Canal Right   Horizontal Canal Right Duration NA   Horizontal Canal Right Symptoms Normal     Horizontal Canal Left   Horizontal Canal Left Duration NA   Horizontal Canal Left Symptoms Normal  [place] Adult PT Treatment/Exercise - 07/16/16 0001      Transfers   Transfers Sit to Stand;Stand to Sit   Sit to Stand 5: Supervision   Stand to Sit 5: Supervision     Ambulation/Gait   Ambulation/Gait Yes   Ambulation/Gait Assistance 5: Supervision;4: Min guard;4: Min assist   Ambulation/Gait Assistance Details Pt required min guard, HHA immediately upon standing in waiting room. Performed gait x75' over indoor surfaces with min guard. With verbal, tactile cueing for sequencing, pt effectively performed gait x230' with SPC. Pt did give effective return demo of gait with SPC initially, but required min reinforcement for remainder of session.   Ambulation Distance (Feet)  305 Feet   Assistive device None;Straight cane   Gait Pattern Step-through pattern;Decreased arm swing - right;Decreased arm swing - left;Decreased stride length;Wide base of support   Ambulation Surface Level;Indoor   Curb 4: Min assist   Curb Details (indicate cue type and reason) using SPC; cueing for sequencing, technique. Pt will require reinforcement.     Posture/Postural Control   Posture/Postural Control Postural limitations   Postural Limitations Rounded Shoulders;Forward head;Increased thoracic kyphosis;Posterior pelvic tilt     Exercises   Exercises Neck     Neck Exercises: Seated   Cervical Rotation Both;5 reps;Other (comment)   Cervical Rotation Limitations x5-sec holds at endrange     Neck Exercises: Supine   Neck Retraction 10 reps;5 secs   Neck Retraction Limitations 5 x5-sec holds each in supine and seated   Cervical Rotation Both;10 reps   Cervical Rotation Limitations 5-sec holds at end range     Manual Therapy   Manual Therapy Myofascial release   Manual therapy comments Supine: gentle contract-relax PNF (3 x5-sec holds per direction) alternating with prolonged passive stretch (3 x30-sec holds per side) of cervical spine rotation bilaterally.   Myofascial Release Supine: suboccipital release x5 minutes total for improved postural alignment, increased cervical spine ROM.                PT Education - 07/16/16 0851    Education provided Yes   Education Details Reviewed HEP. Provided education on postural awareness with emphasis on cervical spine. Sequencing of gait with SPC.   Person(s) Educated Patient   Methods Explanation;Demonstration;Handout;Verbal cues;Tactile cues   Comprehension Returned demonstration;Verbalized understanding          PT Short Term Goals - 07/16/16 0928      PT SHORT TERM GOAL #1   Title STG's = LTG's           PT Long Term Goals - 07/16/16 9458      PT LONG TERM GOAL #1   Title Positional vertigo testing will  be negative to indicate resolved BPPV.  (Target: 07/29/16)   Baseline Met 10/19.   Status Achieved     PT LONG TERM GOAL #2   Title Pt will perform ROM and balance HEP with assistance of caregiver to maximize functional gains made in PT.  (08/13/16)   Baseline 10/19: REVISED from vestibular HEP to ROM and balance HEP, as BPPV now cleared.   Status Revised     PT LONG TERM GOAL #3   Title Pt will decrease DHI score from 54 to < / = 36 to indicate significant decrease in pt-perceived disability due to dizziness.  (08/13/16)   Status On-going     PT LONG TERM GOAL #4   Title Assess strength and balance, if indicated, after vertigo clears.  (07/29/16)   Baseline Met  10/19.   Status Achieved     PT LONG TERM GOAL #5   Title Pt will ambulate x200' over level, indoor surfaces with mod I using LRAD to indicate increased safety with household mobility. (08/13/16)   Status New     Additional Long Term Goals   Additional Long Term Goals Yes     PT LONG TERM GOAL #6   Title Pt will improve cervical spine rotation AROM to >/= 15 degrees in bilat directions to improve safety with functional mobility.  (08/13/16)(   Status New               Plan - 07/16/16 0931    Clinical Impression Statement Positional testing negative and asymptomatic, suggesting no presence of BPPV at this time. Gait stability improved, but pt continues to require close supervision to min guard to ambulate indoors. Cervical spine rotation AROM significantly limited, causing decreased safety with functional mobility (as pt tends to rotate trunk to visualize surroundings, which impacts gait stability). Therefore, added goals for cervical spine rotation AROM and for household mobility. Pt will continue to benefit from skilled outpatient PT 1x/week for 4 weeks to address said impairements.   Rehab Potential Fair   Clinical Impairments Affecting Rehab Potential Alzheimer's disease, financial limitations (high insurance co-pay)    PT Frequency 1x / week   PT Duration 4 weeks   PT Treatment/Interventions ADLs/Self Care Home Management;Canalith Repostioning;Vestibular;DME Instruction;Gait training;Stair training;Functional mobility training;Therapeutic activities;Patient/family education;Neuromuscular re-education;Balance training;Therapeutic exercise;Manual techniques   PT Next Visit Plan Continue to address cervical spine ROM, gait training with SPC, and consider initiating [place].   Consulted and Agree with Plan of Care Patient;Family member/caregiver   Family Member Consulted son, [name]      Patient will benefit from skilled therapeutic intervention in order to improve the following deficits and impairments:  Abnormal gait, Decreased balance, Dizziness, Decreased safety awareness, Impaired perceived functional ability, Decreased range of motion, Impaired flexibility, Increased fascial restricitons, Decreased knowledge of use of DME  Visit Diagnosis: Other abnormalities of gait and mobility - Plan: PT plan of care cert/re-cert  Unsteadiness on feet - Plan: PT plan of care cert/re-cert  Abnormal posture - Plan: PT plan of care cert/re-cert  Cervicalgia - Plan: PT plan of care cert/re-cert     Problem List Patient Active Problem List   Diagnosis Date Noted  . Atrial fibrillation ([place]) 06/18/2016  . Chest pain 06/18/2016  . Angina pectoris, unspecified 08/05/2015  . Scalp pain 01/16/2015  . CN (constipation) 12/17/2014  . Sciatica 10/08/2014  . Stiffness of neck 05/16/2014  . Mass of oropharynx 05/15/2014  . Dysuria 03/21/2014  . Abdominal pain 10/04/2013  . Flank pain 08/28/2013  . Dysphagia 08/28/2013  . Unspecified constipation 07/28/2013  . Forgetfulness 07/18/2013  . Leg swelling 02/14/2013  . Breast nodule 01/03/2013  . Musculoskeletal pain 08/16/2012  . Altered sensation, foot 04/26/2012  . Lung mass 08/05/2011  . OSA (obstructive sleep apnea) 06/12/2011  . Bulging disc 06/12/2011   . BPPV (benign paroxysmal positional vertigo) 05/18/2011  . Hypokalemia 03/18/2011  . De Quervain's syndrome (tenosynovitis) 02/23/2011  . Foot pain, right 11/11/2010  . BACK PAIN, LUMBAR 11/28/2009  . OBESITY 09/11/2008  . ALLERGIC RHINITIS 01/19/2008  . Hypothyroidism 11/25/2006  . Diabetes mellitus type 2, uncontrolled ([place]) 11/25/2006  . Hyperlipidemia 11/25/2006  . HYPERTENSION, BENIGN SYSTEMIC 11/25/2006  . GASTROESOPHAGEAL REFLUX, NO ESOPHAGITIS 11/25/2006  . OSTEOARTHRITIS, MULTI SITES 11/25/2006    Billie Ruddy, PT, DPT [facility] [address] [place], [place], [address] Phone: [phone]   Fax:  [phone] 07/16/16, 5:18 AM  Name: Kristine Boyer MRN: [id number] Date of Birth: 1941-09-15

## 2016-07-18 ENCOUNTER — Other Ambulatory Visit: Payer: Self-pay | Admitting: Internal Medicine

## 2016-07-20 ENCOUNTER — Ambulatory Visit: Payer: Commercial Managed Care - HMO | Admitting: Physical Therapy

## 2016-07-20 DIAGNOSIS — R293 Abnormal posture: Secondary | ICD-10-CM

## 2016-07-20 DIAGNOSIS — R2689 Other abnormalities of gait and mobility: Secondary | ICD-10-CM

## 2016-07-20 DIAGNOSIS — M542 Cervicalgia: Secondary | ICD-10-CM | POA: Diagnosis not present

## 2016-07-20 DIAGNOSIS — R2681 Unsteadiness on feet: Secondary | ICD-10-CM

## 2016-07-20 DIAGNOSIS — H8111 Benign paroxysmal vertigo, right ear: Secondary | ICD-10-CM | POA: Diagnosis not present

## 2016-07-20 DIAGNOSIS — R42 Dizziness and giddiness: Secondary | ICD-10-CM | POA: Diagnosis not present

## 2016-07-20 NOTE — Therapy (Signed)
[facility] [address] [place], [place], [address] Phone: [phone]   Fax:  [phone]  Physical Therapy Treatment  Patient Details  Name: Kristine Boyer MRN: [id number] Date of Birth: January 13, 1941 Referring Provider: Zenia Resides, MD  Encounter Date: 07/20/2016      PT End of Session - 07/20/16 1917    Visit Number 4   Number of Visits 7   Date for PT Re-Evaluation 08/15/16   Authorization Type [employer]   PT Start Time 1450   PT Stop Time 1530   PT Time Calculation (min) 40 min   Activity Tolerance Patient tolerated treatment well   Behavior During Therapy [facility] for tasks assessed/performed      Past Medical History:  Diagnosis Date  . Adenomatous colon polyp   . Allergy   . Alzheimer disease   . Anemia   . Bipolar affective ([place])   . Blood transfusion without reported diagnosis   . Cataract   . Diabetes mellitus   . GERD (gastroesophageal reflux disease)   . Hyperlipidemia   . Hypertension   . Hypothyroidism   . Immune deficiency disorder ([place])   . Internal hemorrhoids   . Seizures ([place])     Past Surgical History:  Procedure Laterality Date  . ABDOMINAL HYSTERECTOMY    . TONSILECTOMY, ADENOIDECTOMY, BILATERAL MYRINGOTOMY AND TUBES      There were no vitals filed for this visit.      Subjective Assessment - 07/20/16 1452    Subjective Pt continues to report no dizziness. Arrived to this session using SPC. Son states, "She hasn't been using the cane until today."   Patient is accompained by: Family member  son, Kristine Boyer   Pertinent History PMH significant for: Alzheimer's, bipolar affective disorder, A-Fib, cataracts, DM, GERD, HTN, HLD, hypothyroidism, immune deficiency, seizures, OA (multiple sites), llumbar bulging disc.    Patient Stated Goals "I want to be well."   Currently in Pain? No/denies            [facility] PT Assessment - 07/20/16 0001      ROM / Strength   AROM / PROM  / Strength AROM     AROM   Overall AROM  Deficits   AROM Assessment Site Cervical   Cervical - Right Rotation 10   Cervical - Left Rotation 12  painful            Vestibular Assessment - 07/20/16 0001      Orthostatics   BP supine (x 5 minutes) 161/79   HR supine (x 5 minutes) 60   BP standing (after 1 minute) 142/81  concordant lightheadedness   HR standing (after 1 minute) 63                 [facility] Adult PT Treatment/Exercise - 07/20/16 0001      Ambulation/Gait   Ambulation/Gait Yes   Ambulation/Gait Assistance 5: Supervision   Ambulation/Gait Assistance Details Cueing provided for positioning of SPC during gait (further laterally to prevent obstruction of gait) and reiterated sequencing of gait using SPC.   Ambulation Distance (Feet) 100 Feet  x2   Assistive device Straight cane   Gait Pattern Step-through pattern;Decreased stride length;Wide base of support   Ambulation Surface Level;Indoor     Posture/Postural Control   Posture/Postural Control Postural limitations   Postural Limitations Rounded Shoulders;Forward head;Increased thoracic kyphosis;Posterior pelvic tilt     Self-Care   Self-Care Other Self-Care Comments   Other Self-Care Comments  Discussed strategies for preventing, managing orthostatic hypotension with focus on hydration, postural adjustment, compression garments, and performance of LE therex. Emphasized importance of static standing > 10 seconds prior to initiating ambulation to decrease fall risk. Pt/son verbalized understanding.     Manual Therapy   Manual Therapy Myofascial release   Myofascial Release Supine: suboccipital release x6 minutes total for improved postural alignment, increased cervical spine ROM.             Balance Exercises - 07/20/16 [place]   Head Movements Standing;Other reps (comment);5 reps  BUE support at countertop   Neck Movements Standing;5 reps;Other reps (comment)   Back Extension  Standing;5 reps   Trunk Movements Standing;5 reps   Overall [place] Comments PT provided cueing for technique, safety with all above exercises.           PT Education - 07/20/16 1910    Education provided Yes   Education Details Initiated [employer]. Prevention/management of postural hypotension; see Self Care for details. Educated pt on use of tennis balls in stockinette for self-myofascial release at suboccipital region.    Person(s) Educated Patient;Child(ren)   Methods Explanation;Demonstration;Verbal cues;Handout   Comprehension Verbalized understanding;Returned demonstration          PT Short Term Goals - 07/16/16 0928      PT SHORT TERM GOAL #1   Title STG's = LTG's           PT Long Term Goals - 07/16/16 1245      PT LONG TERM GOAL #1   Title Positional vertigo testing will be negative to indicate resolved BPPV.  (Target: 07/29/16)   Baseline Met 10/19.   Status Achieved     PT LONG TERM GOAL #2   Title Pt will perform ROM and balance HEP with assistance of caregiver to maximize functional gains made in PT.  (08/13/16)   Baseline 10/19: REVISED from vestibular HEP to ROM and balance HEP, as BPPV now cleared.   Status Revised     PT LONG TERM GOAL #3   Title Pt will decrease DHI score from 54 to < / = 36 to indicate significant decrease in pt-perceived disability due to dizziness.  (08/13/16)   Status On-going     PT LONG TERM GOAL #4   Title Assess strength and balance, if indicated, after vertigo clears.  (07/29/16)   Baseline Met 10/19.   Status Achieved     PT LONG TERM GOAL #5   Title Pt will ambulate x200' over level, indoor surfaces with mod I using LRAD to indicate increased safety with household mobility. (08/13/16)   Status New     Additional Long Term Goals   Additional Long Term Goals Yes     PT LONG TERM GOAL #6   Title Pt will improve cervical spine rotation AROM to >/= 15 degrees in bilat directions to improve safety with functional  mobility.  (08/13/16)(   Status New               Plan - 07/20/16 1920    Clinical Impression Statement Skilled session continued to focus on increasing pain-free ROM in cervical spine and on initiating HEP for standing balance and fall prevention. Pt with report of increased lightheadedness upon transition from supine > sit. Orthostatic vitals revealed the following: supine BP 161/79; standing BP 142/81 with concordant lightheadedness. Per son, BP meds recently increased. Provided pt/son education on management/prevention of postural hypotension. Will route primary  MD this note.    Rehab Potential Fair   Clinical Impairments Affecting Rehab Potential Alzheimer's disease, financial limitations (high insurance co-pay)   PT Frequency 1x / week   PT Duration 4 weeks   PT Treatment/Interventions ADLs/Self Care Home Management;Canalith Repostioning;Vestibular;DME Instruction;Gait training;Stair training;Functional mobility training;Therapeutic activities;Patient/family education;Neuromuscular re-education;Balance training;Therapeutic exercise;Manual techniques   PT Next Visit Plan Continue education on [place].   PT Home Exercise Plan 10/23: [place] HEP initiated.   Consulted and Agree with Plan of Care Patient;Family member/caregiver   Family Member Consulted son, Kristine Boyer      Patient will benefit from skilled therapeutic intervention in order to improve the following deficits and impairments:  Abnormal gait, Decreased balance, Dizziness, Decreased safety awareness, Impaired perceived functional ability, Decreased range of motion, Impaired flexibility, Increased fascial restricitons, Decreased knowledge of use of DME  Visit Diagnosis: Unsteadiness on feet  Other abnormalities of gait and mobility  Abnormal posture  Cervicalgia     Problem List Patient Active Problem List   Diagnosis Date Noted  . Atrial fibrillation ([place]) 06/18/2016  . Chest pain 06/18/2016  . Angina  pectoris, unspecified 08/05/2015  . Scalp pain 01/16/2015  . CN (constipation) 12/17/2014  . Sciatica 10/08/2014  . Stiffness of neck 05/16/2014  . Mass of oropharynx 05/15/2014  . Dysuria 03/21/2014  . Abdominal pain 10/04/2013  . Flank pain 08/28/2013  . Dysphagia 08/28/2013  . Unspecified constipation 07/28/2013  . Forgetfulness 07/18/2013  . Leg swelling 02/14/2013  . Breast nodule 01/03/2013  . Musculoskeletal pain 08/16/2012  . Altered sensation, foot 04/26/2012  . Lung mass 08/05/2011  . OSA (obstructive sleep apnea) 06/12/2011  . Bulging disc 06/12/2011  . BPPV (benign paroxysmal positional vertigo) 05/18/2011  . Hypokalemia 03/18/2011  . De Quervain's syndrome (tenosynovitis) 02/23/2011  . Foot pain, right 11/11/2010  . BACK PAIN, LUMBAR 11/28/2009  . OBESITY 09/11/2008  . ALLERGIC RHINITIS 01/19/2008  . Hypothyroidism 11/25/2006  . Diabetes mellitus type 2, uncontrolled ([place]) 11/25/2006  . Hyperlipidemia 11/25/2006  . HYPERTENSION, BENIGN SYSTEMIC 11/25/2006  . GASTROESOPHAGEAL REFLUX, NO ESOPHAGITIS 11/25/2006  . Candiss Norse SITES 11/25/2006    Billie Ruddy, PT, DPT [facility] [address] [place], [place], [address] Phone: [phone]   Fax:  [phone] 07/20/16, 3:88 PM  Name: Kristine Boyer MRN: [id number] Date of Birth: 1941/09/04

## 2016-07-21 ENCOUNTER — Other Ambulatory Visit: Payer: Self-pay | Admitting: Internal Medicine

## 2016-07-21 NOTE — Telephone Encounter (Signed)
I have refilled Eliquis. Did not refill HCTZ because pharmacy requested 12.5 mg and patient should currently be taking 25 mg daily.

## 2016-07-21 NOTE — Telephone Encounter (Signed)
Needs refill on valsartan.  [facility] [place]

## 2016-07-22 MED ORDER — VALSARTAN 320 MG PO TABS: 320.0000 mg | ORAL_TABLET | Freq: Every day | ORAL | 0 refills | Status: DC

## 2016-07-22 NOTE — Addendum Note (Signed)
Addended by: Melina Schools on: 07/22/2016 10:40 AM   Modules accepted: Orders

## 2016-07-29 ENCOUNTER — Ambulatory Visit: Payer: Commercial Managed Care - HMO | Admitting: Physical Therapy

## 2016-08-05 ENCOUNTER — Ambulatory Visit: Payer: Commercial Managed Care - HMO | Admitting: Physical Therapy

## 2016-08-24 ENCOUNTER — Other Ambulatory Visit: Payer: Self-pay | Admitting: Internal Medicine

## 2016-08-24 MED ORDER — METOPROLOL SUCCINATE ER 200 MG PO TB24: 200.0000 mg | ORAL_TABLET | Freq: Every day | ORAL | 0 refills | Status: DC

## 2016-09-07 ENCOUNTER — Telehealth: Payer: Self-pay | Admitting: Internal Medicine

## 2016-09-07 NOTE — Telephone Encounter (Signed)
Daughter would like to discuss getting pt in geriatric clinic. Please advise. Thanks! ep

## 2016-09-08 NOTE — Telephone Encounter (Signed)
Spoke to daughter given concerns about [name] memory. Daughter states that she has trouble on and off throughout the day with remembering what she is saying and what time of the day it is. Other times she is oriented.   Daughter has been looking into day care centers during the day to keep [name] busy. She has also spoken to some nursing homes in the area.   Daughter states that she tries to reassure [name] regarding her memory because sometimes [name] becomes frustrated/upset when she forgets things.   Only diagnosis on problem list regarding memory is "forgetfullness". Have recommended that patient have appointment scheduled in the Geriatric clinic with Dr. McDiarmid for further evaluation.   Have also recommended patient have an appointment with me for follow up of her chronic medical conditions.   Daughter stated that she planned to call the front desk to schedule these two appointments.   Phill Myron, D.O. 09/08/2016, 9:01 AM PGY-2, [place]

## 2016-09-17 ENCOUNTER — Encounter: Payer: Self-pay | Admitting: Internal Medicine

## 2016-09-23 ENCOUNTER — Telehealth: Payer: Self-pay | Admitting: *Deleted

## 2016-09-23 NOTE — Telephone Encounter (Signed)
[facility] [phone] for 90 day supply of eliquis 5 mg bid  L. Silvano Rusk, RN, BSN

## 2016-09-28 ENCOUNTER — Other Ambulatory Visit: Payer: Self-pay | Admitting: Internal Medicine

## 2016-10-15 ENCOUNTER — Ambulatory Visit: Payer: Self-pay

## 2016-10-16 ENCOUNTER — Ambulatory Visit: Payer: Self-pay | Admitting: Internal Medicine

## 2016-10-22 DIAGNOSIS — M545 Low back pain: Secondary | ICD-10-CM | POA: Diagnosis not present

## 2016-10-22 DIAGNOSIS — M25551 Pain in right hip: Secondary | ICD-10-CM | POA: Diagnosis not present

## 2016-10-29 ENCOUNTER — Ambulatory Visit (INDEPENDENT_AMBULATORY_CARE_PROVIDER_SITE_OTHER): Payer: Medicare HMO | Admitting: Family Medicine

## 2016-10-29 ENCOUNTER — Encounter: Payer: Self-pay | Admitting: Family Medicine

## 2016-10-29 VITALS — BP 124/60 | HR 76 | Temp 98.3°F | Ht 63.0 in | Wt 181.0 lb

## 2016-10-29 DIAGNOSIS — F0390 Unspecified dementia without behavioral disturbance: Secondary | ICD-10-CM | POA: Diagnosis not present

## 2016-10-29 DIAGNOSIS — H8110 Benign paroxysmal vertigo, unspecified ear: Secondary | ICD-10-CM

## 2016-10-29 DIAGNOSIS — H9192 Unspecified hearing loss, left ear: Secondary | ICD-10-CM

## 2016-10-29 DIAGNOSIS — R32 Unspecified urinary incontinence: Secondary | ICD-10-CM | POA: Diagnosis not present

## 2016-10-29 DIAGNOSIS — E039 Hypothyroidism, unspecified: Secondary | ICD-10-CM

## 2016-10-29 DIAGNOSIS — E1165 Type 2 diabetes mellitus with hyperglycemia: Secondary | ICD-10-CM | POA: Diagnosis not present

## 2016-10-29 DIAGNOSIS — IMO0001 Reserved for inherently not codable concepts without codable children: Secondary | ICD-10-CM

## 2016-10-29 DIAGNOSIS — F0391 Unspecified dementia with behavioral disturbance: Secondary | ICD-10-CM

## 2016-10-29 LAB — TSH: TSH: 5.27 mIU/L — ABNORMAL HIGH

## 2016-10-29 LAB — VITAMIN B12: VITAMIN B 12: 1886 pg/mL — AB (ref 200–1100)

## 2016-10-29 LAB — POCT GLYCOSYLATED HEMOGLOBIN (HGB A1C): Hemoglobin A1C: 10.6

## 2016-10-29 NOTE — Patient Instructions (Addendum)
Avoid Meclizine, Benadryl.  This worsens memory and is not a good medicine in the elderly.  Tax adviser  Address :[place] : [place] Zip : [place] Website : [email or web address] Contact Email : [email or web address] Office Phone : [phone] Information Phone : [phone] Special Notes : [facility]- [phone] [place]- [phone]  Senior Resources of [place] can provide information on local resources including:   Information and Referral (Senior InfoLine) providing seniors, family members, caregivers and others access to information and assistance for the elderly.  Home Delivered Meals (Meals on Wheels)  [place] for those ages 59 and over are offered daytime supervised programs targeted to meet their social, educational, physical and recreational interests. A lunch is also served to those who meet the eligibility criteria.  Caregivers - Volunteer-based program designed to provide seniors with 1-3 hours of service per week. Services include friendly visiting, assistance with grocery shopping, errands, general transportation, etc.  Medical Transportation: Transportation to medical appointments for seniors ages 2 and over who do not receive Medicaid. Funds are occasionally available to transport disabled individuals under 60 who do not receive Medicaid.  [place] (S.H.I.I.P.): Volunteers are trained by the [place] S.H.I.I.P. office, to provide free information, counseling and education to [employer] beneficiaries and their family members about: Medicare, Medicare Supplements, [place], and [place] Prescription Drug Plans.  Nutritional Supplement Program: Ensure, Ensure Plus, and Glucerna products are sold at reduced prices. Simply fill out an application.    Alzheimer's  Association [place] is one of over 28 Alzheimer's Association chapters serving communities across the [place]. 24/7 Helpline: 1.778-137-4387 Website at CapitalMile.co.nz             Local chapters across the [place], providing services within each community.  Professionally staffed 24/7 Helpline (1.778-137-4387) offers information and advice to more than 300,000 callers each year and provides translation services in more than 200 languages.   Host face-to-face support groups and educational sessions in communities nationwide.   Connect people across the globe through our [employer], ALZConnected. Our online community is ready to answer your questions and give you support.   Provide caregivers and families with comprehensive online resources and information through our Alzheimer's and [place], which features sections on early-stage, middle-stage and late-stage caregiving.   Help people find clinical studies through our free, easy-to-use matching service Alzheimer's Association TrialMatch. TrialMatch connects individuals with Alzheimer's, caregivers, healthy volunteers and physicians with current studies.   Offer a free online tool, Alzheimer's Navigator, helps those facing the disease to determine their needs and develop an action plan, and our online [employer] is a Glass blower/designer of programs and service, housing and care services, and Clinical cytogeneticist.   House the Alzheimer's Association DIRECTV, the nation's DIRECTV and resource center devoted to increasing knowledge about Alzheimer's disease and related dementias.  Provide safety services, Comfort Zone and MedicAlert + Alzheimer's Association Safe Return, provide location management for people with Alzheimer's who wander.   Our annual Walk to End Alzheimer's is the world's largest event to raise awareness and funds for Alzheimer's care, support and  research.

## 2016-10-29 NOTE — Progress Notes (Addendum)
[facility]:   Patient is accompanied by: son Primary caregiver: son and daughter in law Patient's lives with their family. Patient information was obtained from patient, relative(s) and past medical records. History/Exam limitations: dementia. Primary Care Provider: Melina Schools, DO Referring provider: Phill Myron  DO Reason for referral: Memory Loss Chief Complaint  Patient presents with  . geri assessment    History CC: Forgetfulness  HPI by problems:   Cognitive impairment concern What problems with thinking are there?  memory loss  When were the changes first noticed?  about one year  ago  Did this change occur abruptly or gradually?  gradual  How have the changes progressed since then?  gradually worsening  Does their level of alertness change throughout the day?  no  Is their speech disorganized, rambling?  no  Has there been any tremors or abnormal movements?  no  Have they had in hallucinations or delusions:  yes, she misplaces or hides things, including money, then cannot find them.  She then acuses others in the house of having stolen them.  The objects are found easily by her son when he searches her room. She can be threatening to others when she accuses them of stealing.  She will fall asleep watching TV then believe that sounds or conversations she has heard on TV are happening in her home or within members of her family like believing that her son and DIL are getting a divorce.  They are not.   Have they appeared more anxious or sad lately?  no  Do they still have interests or activities they enjor doing?  yes  How has their appetite been lately?  show no change  How has their sleep been lately?  generally restful sleep  Problem behaviors:  paranoia, suspiciousness and agitation   Compared to 5 to 10 years ago, how is the patient at:  Problems with Judgment, e.g., problem making decisions, bad  financial decisions, problems with thinking? are worsening  She will withdrawal almost all the money from her bank account then spend it, hide and lose it, or gamble and lose it.  Less interested in hobbies or previously enjoyed activities? show no change   Trouble remembering appointments? are worsening    Remembering things about family and friends e.g. names,  occupations, birthdays, addresses?  are worsening  Remembering things that have happened recently?  are worsening  Recalling conversations a few days later?  are worsening  Remembering what day and month it is? are worsening  Remembering where things are usually kept?  are worsening  Losing things?  are worsening  Learning to use a new gadget or machine around the house,e.g., computer, microwave, remote control?  are worsening.  Unable to use microwave and use washing machine without help  Learning new things in general?  show no change  Following a story in a book or on TV?  show no change  Handling money for shopping?  are worsening  Handling financial matters, e.g. their pension,  dealing with the bank?  are worsening  Able to cope with unexpected events?  are worsening  Getting lost?  show no change  Asking same questions repeatedly or telling  the same story repeatedly to the same person(s)?  yes     Outpatient Encounter Prescriptions as of 10/29/2016  Medication Sig  . ACCU-CHEK FASTCLIX LANCETS MISC 1 each by Does not apply route daily before breakfast.  . atorvastatin (LIPITOR) 40 MG tablet Take  1 tablet (40 mg total) by mouth daily.  . Blood Glucose Monitoring Suppl (ACCU-CHEK AVIVA PLUS) W/DEVICE KIT Please check your blood sugar once per day when you wake up.  [name] ELIQUIS 5 MG TABS tablet TAKE 1 TABLET (5 MG TOTAL) BY MOUTH 2 TIMES DAILY.  [name] glipiZIDE (GLUCOTROL) 5 MG tablet Take 1 tablet (5 mg total) by mouth daily before breakfast.  . glucose blood test strip 1 each by Other route daily  before breakfast.  . hydrochlorothiazide (HYDRODIURIL) 25 MG tablet TAKE 1 TABLET (25 MG TOTAL) BY MOUTH DAILY.  [name] levothyroxine (SYNTHROID, LEVOTHROID) 50 MCG tablet TAKE 1 TABLET (50 MCG TOTAL) BY MOUTH DAILY.  . metoprolol (TOPROL-XL) 200 MG 24 hr tablet TAKE 1 TABLET (200 MG TOTAL) BY MOUTH DAILY.  [name] polyethylene glycol powder (GLYCOLAX/MIRALAX) powder Take 17 g by mouth daily.  [name] senna (SENOKOT) 8.6 MG tablet Take 2 tablets (17.2 mg total) by mouth 2 (two) times daily. Once achieve daily BM decrease to one tablet daily  . valsartan (DIOVAN) 320 MG tablet Take 1 tablet (320 mg total) by mouth daily.   No facility-administered encounter medications on file as of 10/29/2016.     History Patient Active Problem List   Diagnosis Date Noted  . Atrial fibrillation ([place]) 06/18/2016    Priority: High  . Diabetes mellitus type 2, uncontrolled ([place]) 11/25/2006    Priority: High  . Angina pectoris, unspecified 08/05/2015    Priority: Medium  . OSA (obstructive sleep apnea) 06/12/2011    Priority: Medium  . Hypothyroidism 11/25/2006    Priority: Medium  . HYPERTENSION, BENIGN SYSTEMIC 11/25/2006    Priority: Medium  . GASTROESOPHAGEAL REFLUX, NO ESOPHAGITIS 11/25/2006    Priority: Medium  . BACK PAIN, LUMBAR 11/28/2009    Priority: Low  . OBESITY 09/11/2008    Priority: Low  . ALLERGIC RHINITIS 01/19/2008    Priority: Low  . Hyperlipidemia 11/25/2006    Priority: Low  . OSTEOARTHRITIS, MULTI SITES 11/25/2006    Priority: Low  . CN (constipation) 12/17/2014  . Dysphagia 08/28/2013  . Unspecified constipation 07/28/2013  . Forgetfulness 07/18/2013  . Breast nodule 01/03/2013  . Lung mass 08/05/2011  . Bulging disc 06/12/2011   Past Medical History:  Diagnosis Date  . Abdominal pain 10/04/2013  . Adenomatous colon polyp   . Allergy   . Altered sensation, foot 04/26/2012  . Alzheimer disease   . Anemia   . Bipolar affective ([place])   . Blood transfusion without reported diagnosis    . BPPV (benign paroxysmal positional vertigo) 05/18/2011  . Cataract   . Chest pain 06/18/2016  . De Quervain's syndrome (tenosynovitis) 02/23/2011   Left wrist. Has worked w/ sports medicine and received steroid injection w/o relief.   . Diabetes mellitus   . Dysphagia 08/28/2013  . Dysuria 03/21/2014  . Flank pain 08/28/2013  . Foot pain, right 11/11/2010  . GERD (gastroesophageal reflux disease)   . Hyperlipidemia   . Hypertension   . Hypokalemia 03/18/2011   Takes potassium pills if develops muscle cramps. Eats lots of bananas. 11/2011   . Hypothyroidism   . Immune deficiency disorder ([place])   . Internal hemorrhoids   . Leg swelling 02/14/2013  . Mass of oropharynx 05/15/2014  . Musculoskeletal pain 08/16/2012  . Scalp pain 01/16/2015  . Sciatica 10/08/2014  . Seizures ([place])   . Stiffness of neck 05/16/2014   Past Surgical History:  Procedure Laterality Date  . ABDOMINAL HYSTERECTOMY    . TONSILECTOMY, ADENOIDECTOMY,  BILATERAL MYRINGOTOMY AND TUBES     Family History  Problem Relation Age of Onset  . Diabetes Mother   . Stomach cancer Sister   . Colon cancer      grandmother   Social History   Social History  . Marital status: Single    Spouse name: N/A  . Number of children: 3  . Years of education: 9/ GED   Occupational History  . retiredAdministrator, Civil Service    Social History Main Topics  . Smoking status: Former Smoker    Quit date: 10/13/2011  . Smokeless tobacco: Never Used  . Alcohol use No  . Drug use: No  . Sexual activity: Not on file   Other Topics Concern  . Not on file   Social History Narrative   Health Care POA:    Emergency Contact: Daughter, Lurline Hare [phone]   End of Life Plan:    Who lives with you: Lives son.    Any pets: none   Diet: Patient currently does not follow a diabetic diet plan.  She reports eating sugary foods often.   Exercise: Patient does not have a current exercise plan.   Seatbelts: Patient reports wearing her seatbelt  when she is in vehicle.   Hobbies: Dancing, watching tv, swimming, singing                Cardiovascular Risk Factors: Hypertension, Lipids and Overweight DMT2  Personal History of Seizures: No -  Personal History of Stroke: No -  Personal History of Head Trauma: No -  Personal History of Psychiatric Disorders: No -  Educational History: GED, 9 years formal education  Basic Activities of Daily Living  AADLs IIndependent NNeeds Assistance DDependent  Bbathing xx    Ddressing xx    AAmbulation xx    TToileting xx    EEating xx     Instrumental Activities of Daily Living IADL Independent Needs Assistance Dependent  Cooking   x  Housework   x  Manage Medications   x  Manage the telephone x    Shopping for food, clothes, Meds, etc   x  Use transportation   x  Manage Finances   x    Caregivers in home: son and DIL  Caregiver Burdens: Not stressing to son   FALLS in last five office visits:  Fall Risk  08/05/2015 03/19/2015 01/15/2015 12/17/2014 11/15/2014  Falls in the past year? _0   No fall (10/29/16)  Health Maintenance reviewed: Immunization History  Administered Date(s) Administered  . Influenza Split 10/13/2011, 08/16/2012  . Influenza Whole 07/09/2008, 08/12/2010  . Influenza,inj,Quad PF,36+ Mos 07/18/2013, 07/20/2014, 06/22/2016  . Pneumococcal Conjugate-13 10/24/2013  . Pneumococcal Polysaccharide-23 06/28/2005  . Td 09/11/2008   Health Maintenance Topics with due status: Overdue     Topic Date Due   PNA vac Low Risk Adult 10/24/2014   FOOT EXAM 11/16/2015   OPHTHALMOLOGY EXAM 02/01/2016    Diet: Regular Nutritional supplements: none  Geriatric Syndromes: Constipation no ,   Incontinence yes  Dizziness BPPV responded well to otolith repositioning and Vestibular rehab   Syncope no   Skin problems no   Visual Impairment no   Hearing impairment yes, left ear   Eating impairment no  Impaired Memory or Cognition yes   Behavioral problems  yes   Sleep problems no   Weight loss no    ROS Denies fevers/chills; denies changes in appetite; denies changes in weight;  Denies changes in vision / hearing /  smell / taste; Denies runny nose / ear pain or discharge / sore throat / sinus congestion / cough/w phlegm; Denies chest congestion / wheezing;  Denies chest pain; denies heart beating slower/thumps inside chest; denies racing heart/flutter; Denies dysuria; denies hematuria;  Denies constipation; denies melena/hematochezia; denies diarrhea;  Denies abdominal discomfort/gaseous bloating; denies N/V; denies heart burn;  Denies recent falls/unsteady gait;  Denies unilateral weakness / clumsiness / tingling / numbness; denies tremors;  Denies sadness / anxiety / suicidal tendencies  Vital Signs   There is no height or weight on file to calculate BMI. CrCl cannot be calculated (Patient's most recent lab result is older than the maximum 21 days allowed.). There is no height or weight on file to calculate BSA. There were no vitals filed for this visit. Wt Readings from Last 3 Encounters:  07/03/16 178 lb 3.2 oz (80.8 kg)  06/22/16 178 lb 9.6 oz (81 kg)  06/20/16 173 lb 3.2 oz (78.6 kg)   No exam data present  Physical Examination:  VS reviewed GEN: Alert, Cooperative, Groomed, NAD HEENT: PERRL; EAC bilaterally not occluded, TM's translucent with normal LM, (+) LR;                No cervical LAN, No thyromegaly, No palpable masses Neuro: Muscle Tone normal; Tremor not present Gait: No significant path deviation, Step-through present  Psych: Normal affect/thought tangential but redirectable/speech clear and prosodic/language concrete   Mini-Mental State Examination or Montreal Cognitive Assessment:  Patient did  require additional cues or prompts to complete tasks. Patient was cooperative and attentive to testing tasks Patient did  appear motivated to perform well  MMSE - Mini Mental State Exam 10/24/2013 02/10/2011   Orientation to time 5 5  Orientation to Place 5 5  Registration 3 3  Attention/ Calculation 5 5  Recall 2 3  Language- name 2 objects 2 2  Language- repeat 1 1  Language- follow 3 step command 3 3  Language- read & follow direction 1 1  Write a sentence 1 1  Copy design 1 1  Total score 29 30       Montreal Cognitive Assessment  10/29/2016  Visuospatial/ Executive (0/5) 1  Naming (0/3) 2  Attention: Read list of digits (0/2) 2  Attention: Read list of letters (0/1) 1  Attention: Serial 7 subtraction starting at 100 (0/3) 0  Language: Repeat phrase (0/2) 1  Language : Fluency (0/1) 0  Abstraction (0/2) 0  Delayed Recall (0/5) 1  Orientation (0/6) 4  Total 12  Adjusted Score (based on education) 13       Geriatric Depression Scale:  4 / 15  Labs  No results found for: VITAMINB12  No results found for: FOLATE  Lab Results  Component Value Date   TSH 6.576 (H) 06/18/2016    No results found for: RPR    Chemistry      Component Value Date/Time   NA 140 06/22/2016 1542   K 3.7 06/22/2016 1542   CL 101 06/22/2016 1542   CO2 31 06/22/2016 1542   BUN 18 06/22/2016 1542   CREATININE 1.14 (H) 06/22/2016 1542      Component Value Date/Time   CALCIUM 9.2 06/22/2016 1542   ALKPHOS 79 06/19/2016 0509   AST 17 06/19/2016 0509   ALT 11 (L) 06/19/2016 0509   BILITOT 0.4 06/19/2016 0509       Lab Results  Component Value Date   HGBA1C 8.9 06/22/2016      Lab  Results  Component Value Date   WBC 8.0 06/20/2016   HGB 11.0 (L) 06/20/2016   HCT 33.1 (L) 06/20/2016   MCV 81.3 06/20/2016   PLT 172 06/20/2016    No results found for this or any previous visit (from the past 24 hour(s)).  Imaging Head CT: 2012; Small vessel disease.  No acute abnormalities  Brain MRI: 2006; Small vessel Disease.  No acute abnormalities.     Personal Strengths Active sense of humor Supportive family/friends  Support System Strengths Supportive Relationships, Family and  Able to Communicate Effectively  Advanced Directives: Code Status: Full code   Assessment and Plan: See Problem list   Patient to Follow up with Dr.Wallace or [facility]

## 2016-10-30 LAB — HIV ANTIBODY (ROUTINE TESTING W REFLEX): HIV 1&2 Ab, 4th Generation: NONREACTIVE

## 2016-10-30 LAB — RPR

## 2016-11-02 ENCOUNTER — Telehealth: Payer: Self-pay

## 2016-11-02 ENCOUNTER — Other Ambulatory Visit: Payer: Self-pay | Admitting: Internal Medicine

## 2016-11-02 DIAGNOSIS — R32 Unspecified urinary incontinence: Secondary | ICD-10-CM | POA: Insufficient documentation

## 2016-11-02 DIAGNOSIS — F03918 Unspecified dementia, unspecified severity, with other behavioral disturbance: Secondary | ICD-10-CM | POA: Insufficient documentation

## 2016-11-02 DIAGNOSIS — F0391 Unspecified dementia with behavioral disturbance: Secondary | ICD-10-CM | POA: Insufficient documentation

## 2016-11-02 DIAGNOSIS — H9190 Unspecified hearing loss, unspecified ear: Secondary | ICD-10-CM | POA: Insufficient documentation

## 2016-11-02 NOTE — Assessment & Plan Note (Signed)
New problem Will need further assessment in future to clarify nature of the incontinence

## 2016-11-02 NOTE — Assessment & Plan Note (Addendum)
Evidence of progression of cognitive and functional impairments from son's report that since at least last year.  - Mild to moderate stage of Dementia (FAST Stages 4 to 5) with resultant impaired abilities instrumental activities of daily living finances, housekeeping, shopping and travel outside home and basic activities of daily living are not currently impaired.  - Ms Capanna does have behavioral and psychological symptoms of dementia including focused paranoia around other stealing from her.  The son has a good understanding of how to make his mother feel safe and how to not escalate her negative emotions by using calming and reassuring communications with her during times of her agitation.  - Given the rather rapid decline in cognitive test scores from 29 / 30 MMSE 2015 and current MoCA 13 / 30, Will obtain Heat CT without contrast to look for evidence of old CVA.  Will check RPR, Vit B12.  TSH is only slightly high in setting of known hypothyroidism on LT4 supplement so doubt role in cognitive decline.    -Discussion of concept of Needs-Driven Behaviors of older adults with dementia.  Initial assessment that physical needs being meet (e.g. Thirst, hunger, toileting, pain); Discussed tools such as engaging in task-oriented behaviors that patient did when younger, e.g. folding clothes, cleaning tools, etc), Reminiscence therapy using old photos or old magazines to prompt conversations, improvisation therapy between older adult and caretaker, appropriate touch and voice/tone, and extinction therapy where positive behaviors are rewarded and negative ones are ignored.    - Counseled patient and family regarding the diagnosis of dementia and progressive nature of the neurodegenerative disorder.  Provided packet of materials assembled for patients and families with more information about dementia and resources to assist with coping with impairments and behaviors.  - Alzheimer Association as a particularly  good resource for support of caregivers.  - Recommend social services consultation for community resources including the [place], adults day care programs, caregiver support.   Recommended caregivers address advanced planning for patient's financial estate, future legal questions of competency and desired medical care the patient desires currently for resuscitation and for future care, including health care power of attorney agent, should patient become severely incapacitated.   -Neurology consult was not recommended.  -After discussion of the small benefits and potential harms of ACHEI medications, the patient and her son declined a trial of ACHEI.   Recommend monitoring for behavioral difficulties and loss of function periodically at future PCP visits.  Patient and caregiver(s) questions were invited and answered as best as possible.  60 minutes face to face where spent in total with counseling / coordination of care took more than 50% of the total time. Counseling involved discussion of the multiple cognitive tests and function test results with patient and wife. Discussion on diagnosis and natural history of dementia.

## 2016-11-02 NOTE — Telephone Encounter (Signed)
Daughter needs to have Dr. Call and tell pt that she can't just take any OTC meds without the Dr. Knowing what she is taking. Mother is trying to take old vitamins and other pills but won't listen to son or daughter. Please call pt @ [phone]. Ottis Stain, CMA

## 2016-11-02 NOTE — Assessment & Plan Note (Signed)
Responded well to otolith repositioning and vestibular rehab by [facility] PT

## 2016-11-02 NOTE — Assessment & Plan Note (Signed)
Lab Results  Component Value Date   TSH 5.27 (H) 10/29/2016   May need slight titration of LT4 therapy

## 2016-11-02 NOTE — Assessment & Plan Note (Signed)
New problem Noted in left ear on screening Will need reassessment next office visit.

## 2016-11-05 ENCOUNTER — Ambulatory Visit
Admission: RE | Admit: 2016-11-05 | Discharge: 2016-11-05 | Disposition: A | Discharge status: Home / Self Care | Payer: Medicare HMO | Source: Ambulatory Visit | Attending: Family Medicine | Admitting: Family Medicine

## 2016-11-05 DIAGNOSIS — I6782 Cerebral ischemia: Secondary | ICD-10-CM | POA: Insufficient documentation

## 2016-11-05 DIAGNOSIS — F0390 Unspecified dementia without behavioral disturbance: Secondary | ICD-10-CM | POA: Diagnosis not present

## 2016-11-07 ENCOUNTER — Other Ambulatory Visit: Payer: Self-pay | Admitting: Internal Medicine

## 2016-11-12 ENCOUNTER — Telehealth: Payer: Self-pay | Admitting: *Deleted

## 2016-11-12 NOTE — Telephone Encounter (Signed)
Contacted and pt son answered phone and we scheduled he an appointment on 12/16/16 with her PCP per message below. Katharina Caper, April D, [place]

## 2016-11-12 NOTE — Telephone Encounter (Signed)
-----   Message from Nicolette Bang, DO sent at 11/12/2016  3:05 PM EST ----- Please call patient and ask her to schedule visit with me to discuss her elevated diabetes test.   Phill Myron, D.O. 11/12/2016, 3:05 PM PGY-2, [place]

## 2016-11-13 ENCOUNTER — Other Ambulatory Visit: Payer: Self-pay | Admitting: Internal Medicine

## 2016-12-02 ENCOUNTER — Encounter (HOSPITAL_COMMUNITY): Payer: Self-pay | Admitting: Emergency Medicine

## 2016-12-02 ENCOUNTER — Observation Stay (HOSPITAL_COMMUNITY)
Admission: EM | Admit: 2016-12-02 | Discharge: 2016-12-03 | Disposition: A | Discharge status: Home / Self Care | Payer: Medicare HMO | Attending: Family Medicine | Admitting: Family Medicine

## 2016-12-02 ENCOUNTER — Emergency Department (HOSPITAL_COMMUNITY): Payer: Medicare HMO

## 2016-12-02 DIAGNOSIS — I10 Essential (primary) hypertension: Secondary | ICD-10-CM | POA: Diagnosis not present

## 2016-12-02 DIAGNOSIS — R079 Chest pain, unspecified: Secondary | ICD-10-CM | POA: Diagnosis not present

## 2016-12-02 DIAGNOSIS — Z7901 Long term (current) use of anticoagulants: Secondary | ICD-10-CM | POA: Diagnosis not present

## 2016-12-02 DIAGNOSIS — Z7984 Long term (current) use of oral hypoglycemic drugs: Secondary | ICD-10-CM | POA: Diagnosis not present

## 2016-12-02 DIAGNOSIS — R0689 Other abnormalities of breathing: Secondary | ICD-10-CM | POA: Diagnosis not present

## 2016-12-02 DIAGNOSIS — Z87891 Personal history of nicotine dependence: Secondary | ICD-10-CM | POA: Diagnosis not present

## 2016-12-02 DIAGNOSIS — R071 Chest pain on breathing: Secondary | ICD-10-CM | POA: Diagnosis present

## 2016-12-02 DIAGNOSIS — Z7982 Long term (current) use of aspirin: Secondary | ICD-10-CM | POA: Insufficient documentation

## 2016-12-02 DIAGNOSIS — E119 Type 2 diabetes mellitus without complications: Secondary | ICD-10-CM | POA: Diagnosis not present

## 2016-12-02 DIAGNOSIS — R0789 Other chest pain: Secondary | ICD-10-CM | POA: Diagnosis not present

## 2016-12-02 DIAGNOSIS — Z79899 Other long term (current) drug therapy: Secondary | ICD-10-CM | POA: Insufficient documentation

## 2016-12-02 DIAGNOSIS — E039 Hypothyroidism, unspecified: Secondary | ICD-10-CM | POA: Diagnosis not present

## 2016-12-02 DIAGNOSIS — R32 Unspecified urinary incontinence: Secondary | ICD-10-CM | POA: Diagnosis not present

## 2016-12-02 DIAGNOSIS — I484 Atypical atrial flutter: Secondary | ICD-10-CM

## 2016-12-02 LAB — TSH: TSH: 5.353 u[IU]/mL — AB (ref 0.350–4.500)

## 2016-12-02 LAB — CBC WITH DIFFERENTIAL/PLATELET
BASOS PCT: 0 %
Basophils Absolute: 0 10*3/uL (ref 0.0–0.1)
EOS ABS: 0.1 10*3/uL (ref 0.0–0.7)
EOS PCT: 1 %
HCT: 33.6 % — ABNORMAL LOW (ref 36.0–46.0)
Hemoglobin: 11.1 g/dL — ABNORMAL LOW (ref 12.0–15.0)
Lymphocytes Relative: 31 %
Lymphs Abs: 2.4 10*3/uL (ref 0.7–4.0)
MCH: 27 pg (ref 26.0–34.0)
MCHC: 33 g/dL (ref 30.0–36.0)
MCV: 81.8 fL (ref 78.0–100.0)
MONO ABS: 0.5 10*3/uL (ref 0.1–1.0)
MONOS PCT: 7 %
Neutro Abs: 4.5 10*3/uL (ref 1.7–7.7)
Neutrophils Relative %: 61 %
Platelets: 156 10*3/uL (ref 150–400)
RBC: 4.11 MIL/uL (ref 3.87–5.11)
RDW: 12.7 % (ref 11.5–15.5)
WBC: 7.5 10*3/uL (ref 4.0–10.5)

## 2016-12-02 LAB — BASIC METABOLIC PANEL
Anion gap: 9 (ref 5–15)
BUN: 10 mg/dL (ref 6–20)
CALCIUM: 9.2 mg/dL (ref 8.9–10.3)
CO2: 31 mmol/L (ref 22–32)
CREATININE: 1.03 mg/dL — AB (ref 0.44–1.00)
Chloride: 98 mmol/L — ABNORMAL LOW (ref 101–111)
GFR calc non Af Amer: 52 mL/min — ABNORMAL LOW (ref >60)
Glucose, Bld: 316 mg/dL — ABNORMAL HIGH (ref 65–99)
Potassium: 3.8 mmol/L (ref 3.5–5.1)
SODIUM: 138 mmol/L (ref 135–145)

## 2016-12-02 LAB — GLUCOSE, CAPILLARY
GLUCOSE-CAPILLARY: 217 mg/dL — AB (ref 65–99)
Glucose-Capillary: 287 mg/dL — ABNORMAL HIGH (ref 65–99)

## 2016-12-02 LAB — I-STAT TROPONIN, ED: TROPONIN I, POC: 0 ng/mL (ref 0.00–0.08)

## 2016-12-02 LAB — TROPONIN I: Troponin I: <0.03 ng/mL (ref <0.03)

## 2016-12-02 LAB — BRAIN NATRIURETIC PEPTIDE: B NATRIURETIC PEPTIDE 5: 153.2 pg/mL — AB (ref 0.0–100.0)

## 2016-12-02 MED ORDER — ONDANSETRON HCL 4 MG PO TABS: 4.0000 mg | ORAL_TABLET | Freq: Four times a day (QID) | ORAL | Status: DC | PRN

## 2016-12-02 MED ORDER — HYDROCHLOROTHIAZIDE 25 MG PO TABS
25.0000 mg | ORAL_TABLET | Freq: Every day | ORAL | Status: DC
  Administered 2016-12-02 – 2016-12-03 (×2): 25 mg via ORAL
  Filled 2016-12-02 (×2): qty 1

## 2016-12-02 MED ORDER — ASPIRIN EC 81 MG PO TBEC
81.0000 mg | DELAYED_RELEASE_TABLET | Freq: Every day | ORAL | Status: DC
  Administered 2016-12-03: 81 mg via ORAL
  Filled 2016-12-02: qty 1

## 2016-12-02 MED ORDER — APIXABAN 5 MG PO TABS
5.0000 mg | ORAL_TABLET | Freq: Two times a day (BID) | ORAL | Status: DC
  Administered 2016-12-02 – 2016-12-03 (×2): 5 mg via ORAL
  Filled 2016-12-02 (×2): qty 1

## 2016-12-02 MED ORDER — ACETAMINOPHEN 325 MG PO TABS
650.0000 mg | ORAL_TABLET | Freq: Four times a day (QID) | ORAL | Status: DC | PRN
  Administered 2016-12-02: 650 mg via ORAL
  Filled 2016-12-02: qty 2

## 2016-12-02 MED ORDER — POLYETHYLENE GLYCOL 3350 17 G PO PACK: 17.0000 g | PACK | Freq: Every day | ORAL | Status: DC | PRN

## 2016-12-02 MED ORDER — INSULIN ASPART 100 UNIT/ML ~~LOC~~ SOLN
0.0000 [IU] | Freq: Three times a day (TID) | SUBCUTANEOUS | Status: DC
  Administered 2016-12-02: 3 [IU] via SUBCUTANEOUS
  Administered 2016-12-03: 2 [IU] via SUBCUTANEOUS
  Administered 2016-12-03: 3 [IU] via SUBCUTANEOUS
  Administered 2016-12-03: 5 [IU] via SUBCUTANEOUS

## 2016-12-02 MED ORDER — SODIUM CHLORIDE 0.9% FLUSH
3.0000 mL | Freq: Two times a day (BID) | INTRAVENOUS | Status: DC
Start: 2016-12-02 — End: 2016-12-03
  Administered 2016-12-02 – 2016-12-03 (×2): 3 mL via INTRAVENOUS

## 2016-12-02 MED ORDER — METOPROLOL SUCCINATE ER 100 MG PO TB24
200.0000 mg | ORAL_TABLET | Freq: Every day | ORAL | Status: DC
  Administered 2016-12-02 – 2016-12-03 (×2): 200 mg via ORAL
  Filled 2016-12-02 (×2): qty 2

## 2016-12-02 MED ORDER — TRAZODONE HCL 50 MG PO TABS
50.0000 mg | ORAL_TABLET | Freq: Every evening | ORAL | Status: DC | PRN
  Administered 2016-12-03: 50 mg via ORAL
  Filled 2016-12-02: qty 1

## 2016-12-02 MED ORDER — ACETAMINOPHEN 650 MG RE SUPP: 650.0000 mg | Freq: Four times a day (QID) | RECTAL | Status: DC | PRN

## 2016-12-02 MED ORDER — ATORVASTATIN CALCIUM 40 MG PO TABS
40.0000 mg | ORAL_TABLET | Freq: Every day | ORAL | Status: DC
  Administered 2016-12-02 – 2016-12-03 (×2): 40 mg via ORAL
  Filled 2016-12-02 (×2): qty 1

## 2016-12-02 MED ORDER — ASPIRIN 81 MG PO CHEW
324.0000 mg | CHEWABLE_TABLET | Freq: Once | ORAL | Status: AC
Start: 2016-12-02 — End: 2016-12-02
  Administered 2016-12-02: 324 mg via ORAL
  Filled 2016-12-02: qty 4

## 2016-12-02 MED ORDER — DOCUSATE SODIUM 100 MG PO CAPS
100.0000 mg | ORAL_CAPSULE | Freq: Two times a day (BID) | ORAL | Status: DC
  Administered 2016-12-02 – 2016-12-03 (×2): 100 mg via ORAL
  Filled 2016-12-02 (×2): qty 1

## 2016-12-02 MED ORDER — LEVOTHYROXINE SODIUM 50 MCG PO TABS
50.0000 ug | ORAL_TABLET | Freq: Every day | ORAL | Status: DC
  Administered 2016-12-03: 50 ug via ORAL
  Filled 2016-12-02: qty 1

## 2016-12-02 MED ORDER — ONDANSETRON HCL 4 MG/2ML IJ SOLN
4.0000 mg | Freq: Four times a day (QID) | INTRAMUSCULAR | Status: DC | PRN
Start: 2016-12-02 — End: 2016-12-03

## 2016-12-02 MED ORDER — IRBESARTAN 150 MG PO TABS
300.0000 mg | ORAL_TABLET | Freq: Every day | ORAL | Status: DC
Start: 2016-12-02 — End: 2016-12-03
  Administered 2016-12-02 – 2016-12-03 (×2): 300 mg via ORAL
  Filled 2016-12-02 (×2): qty 2

## 2016-12-02 NOTE — H&P (Signed)
[facility] Admission History and Physical Service Pager: [phone]  Patient name: Kristine Boyer Medical record number: [id number] Date of birth: May 03, 1941 Age: 76 y.o. Gender: female  Primary Care Provider: Melina Schools, DO Consultants: None Code Status: FULL   Chief Complaint: chest pain   Assessment and Plan: JARED CAHN is a 76 y.o. female presenting with chest pain.  PMH of HTN, a-fib on Eliquis, TSH, T2DM, OA, HLD and mild dementia.   Chest pain Patient presented to ED with worsening intermittent chest pain since Monday.  Reports pain as a 7/10 and does not radiate.  On arrival to ED, VSS and given aspirin.   Initial troponin negative.  EKG with a-flutter, stable from prior EKG and no acute ischemic changes noted.   CXR with no acute cardiopulmonary processes.  Labs otherwise unremarkable.  Chest pain likely MSK given reproducible on exam and no EKG changes from prior.  However in light of history of a-fib and Heart score of 4, will trend troponins and repeat EKG in morning.  Patient also endorses a squeezing pressure worse with exertion which could be concerning for angina like pain. Unlikely CHF given no dyspnea on exertion, minimal LE edema and no signs of fluid overload.  However patient reports some leg swelling this week and will order BNP.   -Admit to tele, attending Dr. Mingo Amber   -cardiology consult in AM to evaluate need for ischemic work up (no prior ischemic work up visible in EMR; note mentions a normal chemical stress test around 2010) -Hold home Meloxicam in setting of current chest pain  -Daily aspirin 81 mg  -cycle troponins  -AM EKG  -Continuous cardiac monitoring  -AM BMET and CBC -BNP -PT/OT consult  -Tylenol 650 mg Q6 PRN for pain -Zofran Q4 prn  -vitals per unit routine   Atrial flutter  -At home on Eliquis for anticoagulation  -Continue Eliquis 5 mg BID   HTN On admission BP 162/94.  At home on HCTZ, Metoprolol  succinate, HCTZ and Valsartan.  -Continue home HCTZ 25 mg daily  -Continue home Metoprolol Succ ER 200 mg daily  -Continue HCTZ 25 mg po daily  -Start Irbesartan 300 mg (equivalent of 350 mg Valsartan she was taking at home).    TSH  At home on Synthroid. -Continue Synthroid 50 mcg daily   -TSH pending   T2DM Last A1c 10.6 in 10/2016.  At home on Glipizide 5 mg daily  -Will hold home DM meds and give sSSI  -CBGs QID -A1c pending   OA. Patient with history of generalized joint pain in multiple sites.  At home on Meloxicam 15 mg daily.   -Will hold Meloxicam for now in light of ongoing chest pain   HLD.  At home on Atorvastatin -Lipid panel ordered  -continue home Lipitor 40 mg daily   Mild dementia.  Per daughter, patient performs majority of ADLs independently however daughter takes care of her as needed.  Lives with daughter and house is handicap accessible.  Daughter and son share responsibility for healthcare decisions.  They have yet to discuss POA.   Patient is oriented to self and place only.  Not to year.  -PT/OT to assess needs   FEN/GI: soft diet (per patient request) SLIV  Prophylaxis: Eliquis  Disposition: Admit to tele, attending Dr. Mingo Amber   History of Present Illness:  Kristine Boyer is a 76 y.o. female presenting with chest pain.   Patient is here in the ED  with her daughter.  Her chest pain started Monday but her most severe symptoms were last night. Describes the pain as a 7/10 in severity.  Squeezing, does not radiate.  Localized substernal and the left side and is intermittent in nature. She was laying on couch when it occurred and she cried out for help. Daughter was sleeping on the couch beside her.   No fainting, dizziness but she did feel sweaty.  Sitting upright does not make the pain better or worse.  Pain is worse when laying flat and also worsened by movement.  Rubbing her chest makes it feel better.   Did not take anything at home for it.  She has also not  taken any of her medications today as her daughter brought her in and was unsure if she should give her mother the medications she takes.  She reports good compliance with medications at home.  Does not report any shortness of breath with the episodes however daughter states she did tell the ED providers that she did.  Endorses some mild leg swelling.  Denies fevers, chills, nausea, vomiting, diarrhea.    Review Of Systems: Per HPI with the following additions:   Review of Systems  Constitutional: Negative for chills, fever and malaise/fatigue.  HENT: Negative for congestion.   Respiratory: Negative for cough, shortness of breath and wheezing.   Cardiovascular: Positive for chest pain and leg swelling. Negative for orthopnea.  Gastrointestinal: Negative for abdominal pain, diarrhea, heartburn, nausea and vomiting.  Musculoskeletal: Positive for joint pain.  Neurological: Negative for dizziness and headaches.   Patient Active Problem List   Diagnosis Date Noted  . Chest pain 12/02/2016  . Incontinence 11/02/2016  . Hearing impairment, Left Ear 11/02/2016  . Dementia with behavioral disturbance 11/02/2016  . Atrial fibrillation ([place]) 06/18/2016  . Angina pectoris, unspecified 08/05/2015  . CN (constipation) 12/17/2014  . Dysphagia 08/28/2013  . Unspecified constipation 07/28/2013  . Forgetfulness 07/18/2013  . Breast nodule 01/03/2013  . Lung mass 08/05/2011  . OSA (obstructive sleep apnea) 06/12/2011  . Bulging disc 06/12/2011  . BPPV (benign paroxysmal positional vertigo) 05/18/2011  . BACK PAIN, LUMBAR 11/28/2009  . OBESITY 09/11/2008  . ALLERGIC RHINITIS 01/19/2008  . Hypothyroidism 11/25/2006  . Diabetes mellitus type 2, uncontrolled ([place]) 11/25/2006  . Hyperlipidemia 11/25/2006  . HYPERTENSION, BENIGN SYSTEMIC 11/25/2006  . GASTROESOPHAGEAL REFLUX, NO ESOPHAGITIS 11/25/2006  . OSTEOARTHRITIS, MULTI SITES 11/25/2006   Past Medical History: Past Medical History:  Diagnosis  Date  . Abdominal pain 10/04/2013  . Adenomatous colon polyp   . Allergy   . Altered sensation, foot 04/26/2012  . Alzheimer disease   . Anemia   . Bipolar affective ([place])   . Blood transfusion without reported diagnosis   . BPPV (benign paroxysmal positional vertigo) 05/18/2011  . Cataract   . Chest pain 06/18/2016  . De Quervain's syndrome (tenosynovitis) 02/23/2011   Left wrist. Has worked w/ sports medicine and received steroid injection w/o relief.   . Diabetes mellitus   . Dysphagia 08/28/2013  . Dysuria 03/21/2014  . Flank pain 08/28/2013  . Foot pain, right 11/11/2010  . GERD (gastroesophageal reflux disease)   . Hyperlipidemia   . Hypertension   . Hypokalemia 03/18/2011   Takes potassium pills if develops muscle cramps. Eats lots of bananas. 11/2011   . Hypothyroidism   . Immune deficiency disorder ([place])   . Internal hemorrhoids   . Leg swelling 02/14/2013  . Mass of oropharynx 05/15/2014  . Musculoskeletal pain 08/16/2012  .  Scalp pain 01/16/2015  . Sciatica 10/08/2014  . Seizures ([place])   . Stiffness of neck 05/16/2014   Past Surgical History: Past Surgical History:  Procedure Laterality Date  . ABDOMINAL HYSTERECTOMY    . TONSILECTOMY, ADENOIDECTOMY, BILATERAL MYRINGOTOMY AND TUBES     Social History: Social History  Substance Use Topics  . Smoking status: Former Smoker    Quit date: 10/13/2011  . Smokeless tobacco: Never Used  . Alcohol use No   Additional social history: Lives at home with her daughter.   Please also refer to relevant sections of EMR.  Family History: Family History  Problem Relation Age of Onset  . Diabetes Mother   . Stomach cancer Sister   . Colon cancer      grandmother   Allergies and Medications: Allergies  Allergen Reactions  . Lactose Intolerance (Gi) Other (See Comments)    constipation  . Vicodin [Hydrocodone-Acetaminophen] Itching  . Neurontin [Gabapentin] Other (See Comments)    Drowsy. Makes her feel "funny"   No current  facility-administered medications on file prior to encounter.    Current Outpatient Prescriptions on File Prior to Encounter  Medication Sig Dispense Refill  . ACCU-CHEK FASTCLIX LANCETS MISC 1 each by Does not apply route daily before breakfast. 102 each 3  . atorvastatin (LIPITOR) 40 MG tablet Take 1 tablet (40 mg total) by mouth daily. 90 tablet 3  . Blood Glucose Monitoring Suppl (ACCU-CHEK AVIVA PLUS) W/DEVICE KIT Please check your blood sugar once per day when you wake up. 1 kit 0  . ELIQUIS 5 MG TABS tablet TAKE 1 TABLET (5 MG TOTAL) BY MOUTH 2 TIMES DAILY. 60 tablet 5  . glipiZIDE (GLUCOTROL) 5 MG tablet Take 1 tablet (5 mg total) by mouth daily before breakfast. 60 tablet 3  . glucose blood test strip 1 each by Other route daily before breakfast. 100 each 12  . hydrochlorothiazide (HYDRODIURIL) 25 MG tablet TAKE 1 TABLET (25 MG TOTAL) BY MOUTH DAILY. 90 tablet 0  . levothyroxine (SYNTHROID, LEVOTHROID) 50 MCG tablet TAKE 1 TABLET (50 MCG TOTAL) BY MOUTH DAILY. 90 tablet 0  . metoprolol (TOPROL-XL) 200 MG 24 hr tablet TAKE 1 TABLET (200 MG TOTAL) BY MOUTH DAILY. 30 tablet 0  . polyethylene glycol powder (GLYCOLAX/MIRALAX) powder Take 17 g by mouth daily. (Patient taking differently: Take 17 g by mouth daily as needed for mild constipation. ) 500 g 1  . senna (SENOKOT) 8.6 MG tablet Take 2 tablets (17.2 mg total) by mouth 2 (two) times daily. Once achieve daily BM decrease to one tablet daily (Patient taking differently: Take 2 tablets by mouth daily as needed for constipation. ) 30 tablet 0  . valsartan (DIOVAN) 320 MG tablet TAKE 1 TABLET (320 MG TOTAL) BY MOUTH DAILY. 90 tablet 0   Objective: BP (!) 160/77 (BP Location: Left Arm)   Pulse 60   Temp 97.7 F (36.5 C) (Oral)   Resp 15   Ht 5' 2.5" (1.588 m)   Wt 176 lb (79.8 kg)   SpO2 100%   BMI 31.68 kg/m  Exam: General: pleasant, in NAD  Eyes: EOMI, PERRL  ENTM: MMM, o/p clear  Neck: supple, no JVD  Cardiovascular: RRR no  MRG, palpable pulses  Respiratory: CTA bilaterally, no wheezes, comfortable work of breathing on RA  Gastrointestinal: soft, NT, ND, no masses, +bs Extremities: trace LE edema bilaterally  MSK: reproducible chest pain with palpation over sternal wall and left chest wall, normal ROM  Derm: no  skin changes, bruising or rashes noted  Neuro: alert and oriented to self and place only.  No focal deficits.   Psych: mood and affect appropriate   Labs and Imaging: CBC BMET   Recent Labs Lab 12/02/16 1131  WBC 7.5  HGB 11.1*  HCT 33.6*  PLT 156    Recent Labs Lab 12/02/16 1131  NA 138  K 3.8  CL 98*  CO2 31  BUN 10  CREATININE 1.03*  GLUCOSE 316*  CALCIUM 9.2     Lovenia Kim, MD 12/02/2016, 5:08 PM PGY-1, [place] Intern pager: [phone], text pages welcome  Upper Level Addendum:  I have seen and evaluated this patient along with Dr. Rosalyn Gess and reviewed the above note, making necessary revisions in green.   Phill Myron, D.O. 12/03/2016, 7:25 AM PGY-2, [place]

## 2016-12-02 NOTE — H&P (Signed)
[facility] Attending Admission Note: Kristine Sabal MD Personal pager:  [phone] FPTS Service Pager:  [phone]  HPI:  76 year old female with one-week increasing chest pain. She has past significant for paroxysmal A. Fib for which she is on Eliquis as anticoagulation, hypertension, hyperlipidemia, type 2 diabetes, mild dementia.Daughter is present and corroborates history. Evidently pain has been intermittent since Monday. However became very severe last night. Described as squeezing/pressure and chest. She was lying on the couch when it occurred. Previously the chest pain had been some nasal exertion as well as without. She denied any lightheadedness,diaphoresis, nausea during her chest pain. Due to the degree of her pain last night she came in today to the emergency department to be evaluated. She has had no fevers or chills.    Exam:   Gen:  Alert, cooperative patient who appears stated age in no acute distress.  Vital signs reviewed. Head: [place]/AT.   Eyes:  EOMI, PERRL.   Ears:  External ears WNL, Bilateral TM's normal without retraction, redness or bulging. Nose:  Septum midline  Mouth:  MMM, tonsils non-erythematous, non-edematous.   Neck:  Trachea midline Cardiac:  Regular rate and rhythm without murmur auscultated.  Good S1/S2. Pulm:  Clear to auscultation bilaterally with good air movement.  No wheezes or rales noted.   Abd:  Soft/nondistended/nontender.  Good bowel sounds throughout all four quadrants.  No masses noted.  Ext:  No LE edema  Imp/Plan: 1.  Chest pain: - retrosternal squeezing pressure worse with exertion and better with rest.  Yesterday's pain occurred at rest.   - she is a diabetic.  Plan to consult cardiology most likely, pending troponins.   -EKG without any acute changes here in the emergency department.she does have known atrial flutter which is present on this EKG as well. -Troponins negative. -We'll admit her to family practiceteaching service and obtain serial  troponins. - hold NSAID/meloxicam in light of current chest pain.  Limit this in future.    2. Atrial flutter: - denies palpitations. - on eliquis.  Continue home meds.  3.  Mild dementia: - daughter present and able to answer questions patient could not - currently full code.    I will sign resident note when completed.  Chronic issues per resident note.   Alveda Reasons, MD 12/02/2016 4:51 PM

## 2016-12-02 NOTE — ED Notes (Signed)
ED Provider at bedside. 

## 2016-12-02 NOTE — ED Triage Notes (Signed)
Pt arrives with daughter from home for cp that started 2 days ago and is intermittent. Pt states she has been feelings a squeezing in her chest that comes and goes. Daughter reports history of dementia. Pt denies any pain at this time. Pt is warm and dry.

## 2016-12-02 NOTE — ED Provider Notes (Addendum)
[facility] DEPT Provider Note   CSN: [id number] Arrival date & time: 12/02/16  1030     History   Chief Complaint Chief Complaint  Patient presents with  . Chest Pain    HPI Kristine Boyer is a 76 y.o. female.  The history is provided by the patient.  Chest Pain   This is a new problem. The current episode started 2 days ago. The problem occurs daily. The pain is associated with movement and breathing. The pain is present in the substernal region and lateral region. The pain is moderate. The quality of the pain is described as pressure-like, brief and dull. The pain does not radiate. The symptoms are aggravated by certain positions. Associated symptoms include lower extremity edema (trace). Pertinent negatives include no abdominal pain, no back pain, no cough, no diaphoresis, no dizziness, no exertional chest pressure, no leg pain, no nausea, no near-syncope, no numbness, no shortness of breath, no sputum production, no syncope and no vomiting. She has tried nothing for the symptoms. Risk factors include being elderly and obesity.  Her past medical history is significant for diabetes, hyperlipidemia and hypertension.  Pertinent negatives for past medical history include no CAD.    Past Medical History:  Diagnosis Date  . Abdominal pain 10/04/2013  . Adenomatous colon polyp   . Allergy   . Altered sensation, foot 04/26/2012  . Alzheimer disease   . Anemia   . Bipolar affective ([place])   . Blood transfusion without reported diagnosis   . BPPV (benign paroxysmal positional vertigo) 05/18/2011  . Cataract   . Chest pain 06/18/2016  . De Quervain's syndrome (tenosynovitis) 02/23/2011   Left wrist. Has worked w/ sports medicine and received steroid injection w/o relief.   . Diabetes mellitus   . Dysphagia 08/28/2013  . Dysuria 03/21/2014  . Flank pain 08/28/2013  . Foot pain, right 11/11/2010  . GERD (gastroesophageal reflux disease)   . Hyperlipidemia   . Hypertension   . Hypokalemia  03/18/2011   Takes potassium pills if develops muscle cramps. Eats lots of bananas. 11/2011   . Hypothyroidism   . Immune deficiency disorder ([place])   . Internal hemorrhoids   . Leg swelling 02/14/2013  . Mass of oropharynx 05/15/2014  . Musculoskeletal pain 08/16/2012  . Scalp pain 01/16/2015  . Sciatica 10/08/2014  . Seizures ([place])   . Stiffness of neck 05/16/2014    Patient Active Problem List   Diagnosis Date Noted  . Incontinence 11/02/2016  . Hearing impairment, Left Ear 11/02/2016  . Dementia with behavioral disturbance 11/02/2016  . Atrial fibrillation ([place]) 06/18/2016  . Angina pectoris, unspecified 08/05/2015  . CN (constipation) 12/17/2014  . Dysphagia 08/28/2013  . Unspecified constipation 07/28/2013  . Forgetfulness 07/18/2013  . Breast nodule 01/03/2013  . Lung mass 08/05/2011  . OSA (obstructive sleep apnea) 06/12/2011  . Bulging disc 06/12/2011  . BPPV (benign paroxysmal positional vertigo) 05/18/2011  . BACK PAIN, LUMBAR 11/28/2009  . OBESITY 09/11/2008  . ALLERGIC RHINITIS 01/19/2008  . Hypothyroidism 11/25/2006  . Diabetes mellitus type 2, uncontrolled ([place]) 11/25/2006  . Hyperlipidemia 11/25/2006  . HYPERTENSION, BENIGN SYSTEMIC 11/25/2006  . GASTROESOPHAGEAL REFLUX, NO ESOPHAGITIS 11/25/2006  . OSTEOARTHRITIS, MULTI SITES 11/25/2006    Past Surgical History:  Procedure Laterality Date  . ABDOMINAL HYSTERECTOMY    . TONSILECTOMY, ADENOIDECTOMY, BILATERAL MYRINGOTOMY AND TUBES      OB History    No data available       Home Medications    Prior to  Admission medications   Medication Sig Start Date End Date Taking? Authorizing Provider  ACCU-CHEK FASTCLIX LANCETS MISC 1 each by Does not apply route daily before breakfast. 05/23/15  Yes Mariel Aloe, MD  aspirin 81 MG chewable tablet Chew 81 mg by mouth daily as needed for mild pain.   Yes Historical Provider, MD  atorvastatin (LIPITOR) 40 MG tablet Take 1 tablet (40 mg total) by mouth daily.  06/22/16  Yes Nicolette Bang, DO  Blood Glucose Monitoring Suppl (ACCU-CHEK AVIVA PLUS) W/DEVICE KIT Please check your blood sugar once per day when you wake up. 05/23/15  Yes Mariel Aloe, MD  ELIQUIS 5 MG TABS tablet TAKE 1 TABLET (5 MG TOTAL) BY MOUTH 2 TIMES DAILY. 08/24/16  Yes Nicolette Bang, DO  glipiZIDE (GLUCOTROL) 5 MG tablet Take 1 tablet (5 mg total) by mouth daily before breakfast. 07/03/16  Yes Nicolette Bang, DO  glucose blood test strip 1 each by Other route daily before breakfast. 05/23/15  Yes Mariel Aloe, MD  hydrochlorothiazide (HYDRODIURIL) 25 MG tablet TAKE 1 TABLET (25 MG TOTAL) BY MOUTH DAILY. 11/16/16  Yes Nicolette Bang, DO  levothyroxine (SYNTHROID, LEVOTHROID) 50 MCG tablet TAKE 1 TABLET (50 MCG TOTAL) BY MOUTH DAILY. 08/24/16  Yes Nicolette Bang, DO  meloxicam (MOBIC) 15 MG tablet Take 15 mg by mouth daily.   Yes Historical Provider, MD  metoprolol (TOPROL-XL) 200 MG 24 hr tablet TAKE 1 TABLET (200 MG TOTAL) BY MOUTH DAILY. 11/16/16  Yes Nicolette Bang, DO  polyethylene glycol powder (GLYCOLAX/MIRALAX) powder Take 17 g by mouth daily. Patient taking differently: Take 17 g by mouth daily as needed for mild constipation.  12/17/14  Yes Mariel Aloe, MD  senna (SENOKOT) 8.6 MG tablet Take 2 tablets (17.2 mg total) by mouth 2 (two) times daily. Once achieve daily BM decrease to one tablet daily Patient taking differently: Take 2 tablets by mouth daily as needed for constipation.  10/24/13  Yes Waldemar Dickens, MD  valsartan (DIOVAN) 320 MG tablet TAKE 1 TABLET (320 MG TOTAL) BY MOUTH DAILY. 11/10/16  Yes Nicolette Bang, DO  vitamin B-12 (CYANOCOBALAMIN) 100 MCG tablet Take 100 mcg by mouth daily.   Yes Historical Provider, MD    Family History Family History  Problem Relation Age of Onset  . Diabetes Mother   . Stomach cancer Sister   . Colon cancer      grandmother    Social History Social History   Substance Use Topics  . Smoking status: Former Smoker    Quit date: 10/13/2011  . Smokeless tobacco: Never Used  . Alcohol use No     Allergies   Lactose intolerance (gi); Vicodin [hydrocodone-acetaminophen]; and Neurontin [gabapentin]   Review of Systems Review of Systems  Constitutional: Negative for diaphoresis.  HENT: Negative for congestion.   Respiratory: Negative for cough, sputum production and shortness of breath.   Cardiovascular: Positive for chest pain. Negative for syncope and near-syncope.  Gastrointestinal: Negative for abdominal pain, nausea and vomiting.  Genitourinary: Positive for difficulty urinating.  Musculoskeletal: Negative for back pain.  Allergic/Immunologic: Negative for immunocompromised state.  Neurological: Negative for dizziness and numbness.  Hematological: Bruises/bleeds easily (on eliquis).  Psychiatric/Behavioral: Negative for confusion.  All other systems reviewed and are negative.    Physical Exam Updated Vital Signs BP 177/95   Pulse 65   Temp 97.8 F (36.6 C) (Oral)   Resp 17   SpO2 100%   Physical  Exam Physical Exam  Nursing note and vitals reviewed. Constitutional:  non-toxic, and in no acute distress Head: Normocephalic and atraumatic.  Mouth/Throat: Oropharynx is clear   Neck: Normal range of motion. Neck supple.  Cardiovascular: Normal rate and regular rhythm.   Pulmonary/Chest: Effort normal and breath sounds normal.  Abdominal: Soft. There is no tenderness. There is no rebound and no guarding.  Musculoskeletal: Normal range of motion.  Neurological: Alert, no facial droop, fluent speech, moves all extremities symmetrically Skin: Skin is warm and dry.  Psychiatric: Cooperative   ED Treatments / Results  Labs (all labs ordered are listed, but only abnormal results are displayed) Labs Reviewed  CBC WITH DIFFERENTIAL/PLATELET - Abnormal; Notable for the following:       Result Value   Hemoglobin 11.1 (*)    HCT  33.6 (*)    All other components within normal limits  BASIC METABOLIC PANEL - Abnormal; Notable for the following:    Chloride 98 (*)    Glucose, Bld 316 (*)    Creatinine, Ser 1.03 (*)    GFR calc non Af Amer 52 (*)    All other components within normal limits  I-STAT TROPOININ, ED    EKG  EKG Interpretation  Date/Time:  Wednesday December 02 2016 10:37:35 EST Ventricular Rate:  65 PR Interval:  134 QRS Duration: 92 QT Interval:  404 QTC Calculation: 420 R Axis:   23 Text Interpretation:  Normal sinus rhythm Possible Anterior infarct , age undetermined ST & T wave abnormality, consider lateral ischemia Abnormal ECG atrial flutter with 4:1 conduction, similar to prior EKG with 3:1 conduction  Confirmed by LIU MD, DANA [phone]) on 12/02/2016 11:05:38 AM       Radiology Dg Chest 2 View  Result Date: 12/02/2016 CLINICAL DATA:  Left-sided chest pain intermittently for the past 2-3 days. History of hypertension, hyperlipidemia, atrial fibrillation, former smoker. EXAM: CHEST  2 VIEW COMPARISON:  Chest x-ray of August 05, 2015 FINDINGS: The lungs are well-expanded. The interstitial markings are coarse. There is no alveolar infiltrate. There is no pleural effusion. The heart and pulmonary vascularity are normal. The mediastinum is normal in width. There is calcification in the wall of the abdominal aorta. There is calcification of the anterior longitudinal ligament of the thoracic spine. IMPRESSION: Mild chronic bronchitic changes.  No alveolar pneumonia nor CHF. Thoracic aortic atherosclerosis. Electronically Signed   By: David  Martinique M.D.   On: 12/02/2016 12:21    Procedures Procedures (including critical care time)  Medications Ordered in ED Medications  aspirin chewable tablet 324 mg (324 mg Oral Given 12/02/16 1221)     Initial Impression / Assessment and Plan / ED Course  I have reviewed the triage vital signs and the nursing notes.  Pertinent labs & imaging results that were  available during my care of the patient were reviewed by me and considered in my medical decision making (see chart for details).      Initial troponin normal. EKG showing slow A flutter with 4-1, seen on prior EKG. No acute ischemic changes noted. Chest x-ray visualized and shows no acute cardiopulmonary processes. Pain seems atypical for that of dissection or PE, or other serious intra-thoracic or pulmonary processes  Chest pain seems atypical, and may be more MSK given some reproducibility of it. Minimal pain here today, fully resolved after aspirin. High heart score 4, would benefit from serial troponins.  Final Clinical Impressions(s) / ED Diagnoses   Final diagnoses:  Nonspecific chest pain  New Prescriptions New Prescriptions   No medications on file     Forde Dandy, MD 12/02/16 Irwin Liu, MD 12/21/16 [phone]

## 2016-12-03 ENCOUNTER — Observation Stay (HOSPITAL_BASED_OUTPATIENT_CLINIC_OR_DEPARTMENT_OTHER): Payer: Medicare HMO

## 2016-12-03 ENCOUNTER — Encounter (HOSPITAL_COMMUNITY): Payer: Self-pay | Admitting: *Deleted

## 2016-12-03 DIAGNOSIS — Z7984 Long term (current) use of oral hypoglycemic drugs: Secondary | ICD-10-CM | POA: Diagnosis not present

## 2016-12-03 DIAGNOSIS — Z7982 Long term (current) use of aspirin: Secondary | ICD-10-CM | POA: Diagnosis not present

## 2016-12-03 DIAGNOSIS — R079 Chest pain, unspecified: Secondary | ICD-10-CM | POA: Diagnosis not present

## 2016-12-03 DIAGNOSIS — Z7901 Long term (current) use of anticoagulants: Secondary | ICD-10-CM | POA: Diagnosis not present

## 2016-12-03 DIAGNOSIS — R32 Unspecified urinary incontinence: Secondary | ICD-10-CM | POA: Diagnosis not present

## 2016-12-03 DIAGNOSIS — E119 Type 2 diabetes mellitus without complications: Secondary | ICD-10-CM | POA: Diagnosis not present

## 2016-12-03 DIAGNOSIS — I484 Atypical atrial flutter: Secondary | ICD-10-CM

## 2016-12-03 DIAGNOSIS — E039 Hypothyroidism, unspecified: Secondary | ICD-10-CM | POA: Diagnosis not present

## 2016-12-03 DIAGNOSIS — I10 Essential (primary) hypertension: Secondary | ICD-10-CM | POA: Diagnosis not present

## 2016-12-03 DIAGNOSIS — R0789 Other chest pain: Secondary | ICD-10-CM | POA: Diagnosis not present

## 2016-12-03 DIAGNOSIS — R0689 Other abnormalities of breathing: Secondary | ICD-10-CM | POA: Diagnosis not present

## 2016-12-03 LAB — NM MYOCAR MULTI W/SPECT W/WALL MOTION / EF
CHL CUP RESTING HR STRESS: 63 {beats}/min
Estimated workload: 1 METS
Exercise duration (min): 5 min
Exercise duration (sec): 16 s
Peak HR: 80 {beats}/min

## 2016-12-03 LAB — CBC
HCT: 32.1 % — ABNORMAL LOW (ref 36.0–46.0)
HEMOGLOBIN: 10.7 g/dL — AB (ref 12.0–15.0)
MCH: 27 pg (ref 26.0–34.0)
MCHC: 33.3 g/dL (ref 30.0–36.0)
MCV: 81.1 fL (ref 78.0–100.0)
Platelets: 157 10*3/uL (ref 150–400)
RBC: 3.96 MIL/uL (ref 3.87–5.11)
RDW: 12.7 % (ref 11.5–15.5)
WBC: 9.2 10*3/uL (ref 4.0–10.5)

## 2016-12-03 LAB — LIPID PANEL
Cholesterol: 140 mg/dL (ref 0–200)
HDL: 51 mg/dL (ref >40)
LDL CALC: 71 mg/dL (ref 0–99)
Total CHOL/HDL Ratio: 2.7 RATIO
Triglycerides: 88 mg/dL (ref <150)
VLDL: 18 mg/dL (ref 0–40)

## 2016-12-03 LAB — BASIC METABOLIC PANEL
ANION GAP: 9 (ref 5–15)
BUN: 13 mg/dL (ref 6–20)
CALCIUM: 8.8 mg/dL — AB (ref 8.9–10.3)
CO2: 28 mmol/L (ref 22–32)
Chloride: 100 mmol/L — ABNORMAL LOW (ref 101–111)
Creatinine, Ser: 0.96 mg/dL (ref 0.44–1.00)
GFR, EST NON AFRICAN AMERICAN: 56 mL/min — AB (ref >60)
Glucose, Bld: 243 mg/dL — ABNORMAL HIGH (ref 65–99)
Potassium: 3.2 mmol/L — ABNORMAL LOW (ref 3.5–5.1)
SODIUM: 137 mmol/L (ref 135–145)

## 2016-12-03 LAB — HEMOGLOBIN A1C
HEMOGLOBIN A1C: 10.9 % — AB (ref 4.8–5.6)
MEAN PLASMA GLUCOSE: 266 mg/dL

## 2016-12-03 LAB — GLUCOSE, CAPILLARY
GLUCOSE-CAPILLARY: 226 mg/dL — AB (ref 65–99)
Glucose-Capillary: 262 mg/dL — ABNORMAL HIGH (ref 65–99)
Glucose-Capillary: 176 mg/dL — ABNORMAL HIGH (ref 65–99)

## 2016-12-03 LAB — TROPONIN I: Troponin I: 0.03 ng/mL (ref <0.03)

## 2016-12-03 MED ORDER — POTASSIUM CHLORIDE CRYS ER 20 MEQ PO TBCR
40.0000 meq | EXTENDED_RELEASE_TABLET | Freq: Once | ORAL | Status: AC
  Administered 2016-12-03: 40 meq via ORAL

## 2016-12-03 MED ORDER — REGADENOSON 0.4 MG/5ML IV SOLN
INTRAVENOUS | Status: AC
  Filled 2016-12-03: qty 5

## 2016-12-03 MED ORDER — TECHNETIUM TC 99M TETROFOSMIN IV KIT
30.0000 | PACK | Freq: Once | INTRAVENOUS | Status: AC | PRN
  Administered 2016-12-03: 30 via INTRAVENOUS

## 2016-12-03 MED ORDER — TECHNETIUM TC 99M TETROFOSMIN IV KIT
10.0000 | PACK | Freq: Once | INTRAVENOUS | Status: AC | PRN
  Administered 2016-12-03: 10 via INTRAVENOUS

## 2016-12-03 MED ORDER — DOCUSATE SODIUM 100 MG PO CAPS: 100.0000 mg | ORAL_CAPSULE | Freq: Two times a day (BID) | ORAL | 0 refills | Status: DC

## 2016-12-03 MED ORDER — REGADENOSON 0.4 MG/5ML IV SOLN
0.4000 mg | Freq: Once | INTRAVENOUS | Status: AC
Start: 2016-12-03 — End: 2016-12-03
  Administered 2016-12-03: 0.4 mg via INTRAVENOUS
  Filled 2016-12-03: qty 5

## 2016-12-03 NOTE — Evaluation (Signed)
Physical Therapy Evaluation Patient Details Name: Kristine Boyer MRN: [id number] DOB: April 27, 1941 Today's Date: 12/03/2016   History of Present Illness  Patient is a 76 y/o female with hx of Alzheimer's disease, bipolar afective disorder, DM, HTN, HLD, paroxysmal A. Fib, seizure disorder presents with chest pain. EKG unremarkable. Plan for stress test 3/8.  Clinical Impression  Patient presents with mild balance deficits, dyspnea on exertion and baseline cognitive deficits secondary to Alzheimer's dementia s/p above. Pt tolerated gait training with RW and min guard for safety. At baseline, pt furniture walker or uses RW for community ambulation. Rotates staying with son and daughter to have the necessary supervision at home. Does her own ADLs with occasional assist for bathing. Pt with no chest pain during mobility. Demonstrates impaired safety awareness due to impulsivity. Will follow acutely to maximize independence and mobility prior to return home.     Follow Up Recommendations No PT follow up;Supervision for mobility/OOB;Supervision/Assistance - 24 hour    Equipment Recommendations  None recommended by PT    Recommendations for Other Services       Precautions / Restrictions Precautions Precautions: Fall Restrictions Weight Bearing Restrictions: No      Mobility  Bed Mobility Overal bed mobility: Modified Independent             General bed mobility comments: Increased time and effort but no assist needed.  Transfers Overall transfer level: Needs assistance Equipment used: None Transfers: Sit to/from Stand Sit to Stand: Min guard         General transfer comment: Min guard to steady in standing but requires Min A for balance with pt reaching for therapist for support.   Ambulation/Gait Ambulation/Gait assistance: Min guard Ambulation Distance (Feet): 150 Feet Assistive device: Rolling walker (2 wheeled) Gait Pattern/deviations: Step-through pattern;Decreased  stride length;Trunk flexed Gait velocity: decreased Gait velocity interpretation: <1.8 ft/sec, indicative of risk for recurrent falls General Gait Details: Slow, mostly steady gait with cues for upright posture. No chest pain during mobility. Distracted.   Stairs            Wheelchair Mobility    Modified Rankin (Stroke Patients Only)       Balance Overall balance assessment: Needs assistance Sitting-balance support: Feet supported;No upper extremity supported Sitting balance-Leahy Scale: Good     Standing balance support: During functional activity;Single extremity supported Standing balance-Leahy Scale: Fair Standing balance comment: Requires UE support to steady in standing as pt funriture walker within room, does better with UE support using RW.                             Pertinent Vitals/Pain Pain Assessment: No/denies pain    Home Living Family/patient expects to be discharged to:: Private residence Living Arrangements: Children Available Help at Discharge: Family Type of Home: House Home Access: Stairs to enter Entrance Stairs-Rails: Right Entrance Stairs-Number of Steps: 5 Home Layout: One level Home Equipment: Cane - single point;Grab bars - tub/shower;Grab bars - toilet;Walker - 2 wheels;Bedside commode Additional Comments: At son;s and daughter's house, pt negotiates 5 steps to get into home. Needs to go to second level at son's house to shower.    Prior Function Level of Independence: Independent with assistive device(s)         Comments: Uses RW for community ambulation and funriture walks for household distances. Rotates staying with daughter and son. Does some housework. Needs help with bathing at times. Can dress self.  Hand Dominance        Extremity/Trunk Assessment   Upper Extremity Assessment Upper Extremity Assessment: Defer to OT evaluation    Lower Extremity Assessment Lower Extremity Assessment: Overall WFL for  tasks assessed       Communication   Communication: No difficulties  Cognition Arousal/Alertness: Awake/alert Behavior During Therapy: WFL for tasks assessed/performed;Impulsive Overall Cognitive Status: History of cognitive impairments - at baseline                 General Comments: Hx of dementia. Per daugher, pt at baseline with cognition. High fall risk due to impulsivity.    General Comments General comments (skin integrity, edema, etc.): Daughter present during session and assisted with providing PLOF. Goes to Alzheimer's support groups.    Exercises     Assessment/Plan    PT Assessment Patient needs continued PT services  PT Problem List Decreased mobility;Decreased safety awareness;Decreased balance;Decreased cognition;Cardiopulmonary status limiting activity;Decreased activity tolerance       PT Treatment Interventions Therapeutic activities;Gait training;Therapeutic exercise;Patient/family education;Balance training;Stair training;Functional mobility training    PT Goals (Current goals can be found in the Care Plan section)  Acute Rehab PT Goals Patient Stated Goal: to get something to eat PT Goal Formulation: With patient Time For Goal Achievement: 12/17/16 Potential to Achieve Goals: Good    Frequency Min 3X/week   Barriers to discharge Inaccessible home environment stairs to enter home    Co-evaluation               End of Session Equipment Utilized During Treatment: Gait belt Activity Tolerance: Patient tolerated treatment well Patient left: in bed;with call bell/phone within reach;with family/visitor present;with bed alarm set Nurse Communication: Mobility status PT Visit Diagnosis: Unsteadiness on feet (R26.81)    Functional Assessment Tool Used: Clinical judgement Functional Limitation: Mobility: Walking and moving around Mobility: Walking and Moving Around Current Status (V7846): At least 20 percent but less than 40 percent impaired,  limited or restricted Mobility: Walking and Moving Around Goal Status [phone]): At least 1 percent but less than 20 percent impaired, limited or restricted    Time: 1002-1020 PT Time Calculation (min) (ACUTE ONLY): 18 min   Charges:   PT Evaluation $PT Eval Low Complexity: 1 Procedure     PT G Codes:   PT G-Codes **NOT FOR INPATIENT CLASS** Functional Assessment Tool Used: Clinical judgement Functional Limitation: Mobility: Walking and moving around Mobility: Walking and Moving Around Current Status (M8413): At least 20 percent but less than 40 percent impaired, limited or restricted Mobility: Walking and Moving Around Goal Status [phone]): At least 1 percent but less than 20 percent impaired, limited or restricted     [place] 12/03/2016, 10:29 AM Wray Kearns, PT, DPT [phone]

## 2016-12-03 NOTE — Evaluation (Signed)
Occupational Therapy Evaluation and Discharge Patient Details Name: Kristine Boyer MRN: [id number] DOB: September 17, 1941 Today's Date: 12/03/2016    History of Present Illness Patient is a 76 y/o female with hx of Alzheimer's disease, bipolar afective disorder, DM, HTN, HLD, paroxysmal A. Fib, seizure disorder presents with chest pain. EKG unremarkable. Plan for stress test 3/8.   Clinical Impression   Per family pt required supervision for tub transfers and occasional assist for bathing. Currently pt overall min hand held assist for functional mobility and min guard for ADL. Family reports they feel pt is currently at or close to her functional baseline. Pt planning to d/c home with 24/7 supervision from family. No further acute OT needs identified; signing off at this time. Please re-consult if needs change. Thank you for this referral.    Follow Up Recommendations  No OT follow up;Supervision/Assistance - 24 hour    Equipment Recommendations  Tub/shower seat    Recommendations for Other Services       Precautions / Restrictions Precautions Precautions: Fall Restrictions Weight Bearing Restrictions: No      Mobility Bed Mobility Overal bed mobility: Needs Assistance Bed Mobility: Supine to Sit     Supine to sit: Min assist     General bed mobility comments: Min hand held assist to elevate trunk to sitting.  Transfers Overall transfer level: Needs assistance Equipment used: None Transfers: Sit to/from Stand Sit to Stand: Min guard         General transfer comment: Min guard for safety. Pt moves quickly with transfers    Balance Overall balance assessment: Needs assistance Sitting-balance support: Feet supported;No upper extremity supported Sitting balance-Leahy Scale: Good     Standing balance support: Single extremity supported;During functional activity Standing balance-Leahy Scale: Fair Standing balance comment: Hand held assit for dynamic standing balance                             ADL Overall ADL's : Needs assistance/impaired Eating/Feeding: Independent;Sitting   Grooming: Min guard;Standing   Upper Body Bathing: Set up;Supervision/ safety;Sitting   Lower Body Bathing: Min guard;Sit to/from stand   Upper Body Dressing : Set up;Supervision/safety;Sitting   Lower Body Dressing: Min guard;Sit to/from stand   Toilet Transfer: Minimal assistance;Ambulation;Regular Glass blower/designer Details (indicate cue type and reason): Simulated by sit to stand from EOB with functional mobility. Min hand held assist for mobility         Functional mobility during ADLs: Minimal assistance (hand held assist)       Vision         Perception     Praxis      Pertinent Vitals/Pain Pain Assessment: Faces Faces Pain Scale: Hurts even more Pain Location: lower back Pain Descriptors / Indicators: Sore;Guarding Pain Intervention(s): Monitored during session;Repositioned     Hand Dominance     Extremity/Trunk Assessment Upper Extremity Assessment Upper Extremity Assessment: Overall WFL for tasks assessed   Lower Extremity Assessment Lower Extremity Assessment: Defer to PT evaluation   Cervical / Trunk Assessment Cervical / Trunk Assessment: Kyphotic   Communication Communication Communication: No difficulties   Cognition Arousal/Alertness: Awake/alert Behavior During Therapy: WFL for tasks assessed/performed Overall Cognitive Status: History of cognitive impairments - at baseline                 General Comments: Hx of dementia. Per daugher, pt at baseline with cognition. High fall risk due to impulsivity.   General Comments  Daughter present during session and assisted with providing PLOF. Goes to Alzheimer's support groups.    Exercises       Shoulder Instructions      Home Living Family/patient expects to be discharged to:: Private residence Living Arrangements: Children Available Help at Discharge:  Family;Available 24 hours/day Type of Home: House Home Access: Stairs to enter [employer] of Steps: 5 Entrance Stairs-Rails: Right Home Layout: One level     Bathroom Shower/Tub: Teacher, early years/pre: Standard     Home Equipment: Cane - single point;Grab bars - tub/shower;Grab bars - toilet;Walker - 2 wheels;Bedside commode   Additional Comments: At son;s and daughter's house, pt negotiates 5 steps to get into home. Needs to go to second level at son's house to shower.      Prior Functioning/Environment Level of Independence: Independent with assistive device(s)        Comments: Uses RW for community ambulation and funriture walks for household distances. Rotates staying with daughter and son. Does some housework. Needs help with bathing at times. Can dress self.        OT Problem List:        OT Treatment/Interventions:      OT Goals(Current goals can be found in the care plan section) Acute Rehab OT Goals Patient Stated Goal: to get something to eat OT Goal Formulation: All assessment and education complete, DC therapy  OT Frequency:     Barriers to D/C:            Co-evaluation              End of Session Equipment Utilized During Treatment: Gait belt Nurse Communication: Mobility status;Other (comment) (pt reporting back pain)  Activity Tolerance: Patient tolerated treatment well Patient left: in chair;with call bell/phone within reach;with family/visitor present  OT Visit Diagnosis: Unsteadiness on feet (R26.81)                ADL either performed or assessed with clinical judgement  Time: 1113-1130 OT Time Calculation (min): 17 min Charges:  OT General Charges $OT Visit: 1 Procedure OT Evaluation $OT Eval Moderate Complexity: 1 Procedure G-Codes: OT G-codes **NOT FOR INPATIENT CLASS** Functional Assessment Tool Used: Clinical judgement Functional Limitation: Self care Self Care Current Status (L3810): At least 1  percent but less than 20 percent impaired, limited or restricted Self Care Goal Status (F7510): At least 1 percent but less than 20 percent impaired, limited or restricted Self Care Discharge Status [phone]): At least 1 percent but less than 20 percent impaired, limited or restricted   Mel Almond A. Ulice Brilliant, M.S., OTR/L Pager: [place] 12/03/2016, 1:15 PM

## 2016-12-03 NOTE — Progress Notes (Signed)
Nuclear stress test showed no ischemia and normal LVF. No further inpt cardiac workup.  Patient should followup with Dr. Marlou Porch as outpt to discuss further treatment of her atrial flutter.  Will sign off.  Call with any questions.

## 2016-12-03 NOTE — Progress Notes (Signed)
Family Medicine Teaching Service Daily Progress Note Intern Pager: [phone]  Patient name: Kristine Boyer Medical record number: [id number] Date of birth: 09-24-1941 Age: 76 y.o. Gender: female  Primary Care Provider: Melina Schools, DO Consultants: cardiology Code Status: FULL  Pt Overview and Major Events to Date:  3/7 admitted for ACS workup 3/8 cards recommended myoview  Assessment and Plan: Kristine Boyer is a 76 y.o. female presenting with chest pain.  PMH of HTN, a-fib on Eliquis, TSH, T2DM, OA, HLD and mild dementia.   Chest pain none on my exam today. Cardiology consulted, recommended myoview. Trop <0.03x2, then 0.03.  - consulted cardiology, appreciate recs -Hold home Meloxicam in setting of chest pain  -Daily aspirin 81 mg  -PT/OT consult  -Tylenol 650 mg Q6 PRN for pain -Zofran Q4 prn   Atrial flutter  -At home on Eliquis for anticoagulation  -Continue Eliquis 5 mg BID   HTN At home on HCTZ, Metoprolol succinate, HCTZ and Valsartan.  -Continue home HCTZ 25 mg daily  -Continue home Metoprolol Succ ER 200 mg daily  -Start Irbesartan 300 mg (equivalent of 350 mg Valsartan she was taking at home).    Hypothyroidism At home on Synthroid. TSH 5.35, and consistent with prior. Will hold on increasing in the setting of acute illness and defer to PCP.  -continue Synthroid 50 mcg daily    T2DM Last A1c 10.6 in 10/2016.  At home on Glipizide 5 mg daily. HgA1c.   -Will hold home DM meds and give sSSI  -CBGs QID  OA. Patient with history of generalized joint pain in multiple sites.  At home on Meloxicam 15 mg daily.   -Will hold Meloxicam for now in light of ongoing chest pain   HLD.  At home on Atorvastatin. Lipid panel WNL.  -continue home Lipitor 40 mg daily   Mild dementia.  Per daughter, patient performs majority of ADLs independently however daughter takes care of her as needed.  -PT/OT to assess needs   FEN/GI: soft diet (per patient request) SLIV   Prophylaxis: Eliquis  Disposition: continue inpatient management   Subjective:  Patient joking without complaints today. Sitting on edge of bed. Daughter in room asking questions about how to tell when someone with dementia has real pain.   Objective: Temp:  [97.5 F (36.4 C)-98.1 F (36.7 C)] 97.5 F (36.4 C) (03/08 0500) Pulse Rate:  [60-66] 65 (03/08 1420) Resp:  [15-19] 19 (03/08 0500) BP: (119-170)/(63-98) 134/70 (03/08 1420) SpO2:  [97 %-100 %] 100 % (03/08 0500) Weight:  [176 lb (79.8 kg)-179 lb 14.4 oz (81.6 kg)] 179 lb 14.4 oz (81.6 kg) (03/08 0500) Physical Exam: General: pleasant, in NAD. Neck: supple, no JVD  Cardiovascular: RRR no MRG, palpable pulses  Respiratory: CTA bilaterally, no wheezes, easy WOB Gastrointestinal: SNTND, no masses, +bs Neuro: alert and oriented to self and place only.  No focal deficits.   Psych: mood and affect appropriate   Laboratory:  Recent Labs Lab 12/02/16 1131 12/03/16 0409  WBC 7.5 9.2  HGB 11.1* 10.7*  HCT 33.6* 32.1*  PLT 156 157    Recent Labs Lab 12/02/16 1131 12/03/16 0409  NA 138 137  K 3.8 3.2*  CL 98* 100*  CO2 31 28  BUN 10 13  CREATININE 1.03* 0.96  CALCIUM 9.2 8.8*  GLUCOSE 316* 243*    Trop <0.03 > <0.03 > 0.03  Imaging/Diagnostic Tests: No results found.   Sela Hilding, MD 12/03/2016, 2:41 PM PGY-1, [facility] FPTS Intern pager: [phone], text pages welcome

## 2016-12-03 NOTE — Plan of Care (Signed)
Problem: Safety: Goal: Ability to remain free from injury will improve Outcome: Progressing Patient has a history of dementia with impulsivity but is able to ambulate without assistance. Patient is a high fall risk. Patient's daughter is at the bedside with her; bed is in lowest position; call bell is within reach.

## 2016-12-03 NOTE — Care Management Obs Status (Signed)
[facility] NOTIFICATION   Patient Details  Name: Kristine Boyer MRN: [id number] Date of Birth: July 24, 1941   Medicare Observation Status Notification Given:  Yes    Bethena Roys, RN 12/03/2016, 3:55 PM

## 2016-12-03 NOTE — Consult Note (Signed)
CARDIOLOGY CONSULT NOTE   Patient ID: Kristine Boyer MRN: [id number] DOB/AGE: 1941-08-04 76 y.o.  Admit date: 12/02/2016  Requesting Physician: Family Practice, Dr. Mingo Amber, Lenna Sciara Primary Physician:   Melina Schools, DO Primary Cardiologist:   Dr. Marlou Porch Reason for Consultation:   Chest pain  HPI: Kristine Boyer is a 76 y.o. female with a history of paroxysmal  Afib on Eliquis, hypertension, hyperlipidemia, type 2 diabetes, mild dementia,  Hypothyroidism. 2D echo done 9/17, EF of 60-65% with normal wall motion.  She has been having intermittent CP since Monday which became very severe on Tuesday night. The pain was described as squeezing/pressure pains in the chest at rest. She has had some chest pains with exertions as well, when the pain became so severe the patient decided to go the ER for evaluation. She has had 3 negative Troponins, Chest xray shows no acute findings, EKG showing A flutter with 4-1 as seen on prior EKG. TSH is elevated at 3.353. Her pain was thought to be atypical in the ER but due to a heart score of 4, she was admitted for enzyme cycling to [facility] and we have been asked to consult.  The patient is a very sweet demented lady, her daughter is here as well. She did not follow-up with cardiology after her admission to the hospital as instructed because she forgot. The patient remembers that she had chest pain and described it as squeezing but is unable to provide much more insight. Her daughter believes she has been less energetic and having episodes of SOB. It is unclear if she has been having palpitations. Currently pain free.    Past Medical History:  Diagnosis Date  . Abdominal pain 10/04/2013  . Adenomatous colon polyp   . Allergy   . Altered sensation, foot 04/26/2012  . Alzheimer disease   . Anemia   . Bipolar affective ([place])   . Blood transfusion without reported diagnosis   . BPPV (benign paroxysmal positional vertigo) 05/18/2011  . Cataract     . Chest pain 06/18/2016  . De Quervain's syndrome (tenosynovitis) 02/23/2011   Left wrist. Has worked w/ sports medicine and received steroid injection w/o relief.   . Diabetes mellitus   . Dysphagia 08/28/2013  . Dysuria 03/21/2014  . Flank pain 08/28/2013  . Foot pain, right 11/11/2010  . GERD (gastroesophageal reflux disease)   . Hyperlipidemia   . Hypertension   . Hypokalemia 03/18/2011   Takes potassium pills if develops muscle cramps. Eats lots of bananas. 11/2011   . Hypothyroidism   . Immune deficiency disorder ([place])   . Internal hemorrhoids   . Leg swelling 02/14/2013  . Mass of oropharynx 05/15/2014  . Musculoskeletal pain 08/16/2012  . Scalp pain 01/16/2015  . Sciatica 10/08/2014  . Seizures ([place])   . Stiffness of neck 05/16/2014     Past Surgical History:  Procedure Laterality Date  . ABDOMINAL HYSTERECTOMY    . TONSILECTOMY, ADENOIDECTOMY, BILATERAL MYRINGOTOMY AND TUBES      Allergies  Allergen Reactions  . Lactose Intolerance (Gi) Other (See Comments)    constipation  . Vicodin [Hydrocodone-Acetaminophen] Itching  . Neurontin [Gabapentin] Other (See Comments)    Drowsy. Makes her feel "funny"    I have reviewed the patient's current medications . apixaban  5 mg Oral BID  . aspirin EC  81 mg Oral Daily  . atorvastatin  40 mg Oral Daily  . docusate sodium  100 mg Oral BID  .  hydrochlorothiazide  25 mg Oral Daily  . insulin aspart  0-9 Units Subcutaneous TID WC  . irbesartan  300 mg Oral Daily  . levothyroxine  50 mcg Oral QAC breakfast  . metoprolol  200 mg Oral Daily  . sodium chloride flush  3 mL Intravenous Q12H    acetaminophen **OR** acetaminophen, ondansetron **OR** ondansetron (ZOFRAN) IV, polyethylene glycol, traZODone  Prior to Admission medications   Medication Sig Start Date End Date Taking? Authorizing Provider  ACCU-CHEK FASTCLIX LANCETS MISC 1 each by Does not apply route daily before breakfast. 05/23/15  Yes Mariel Aloe, MD  aspirin 81 MG  chewable tablet Chew 81 mg by mouth daily as needed for mild pain.   Yes Historical Provider, MD  atorvastatin (LIPITOR) 40 MG tablet Take 1 tablet (40 mg total) by mouth daily. 06/22/16  Yes Nicolette Bang, DO  Blood Glucose Monitoring Suppl (ACCU-CHEK AVIVA PLUS) W/DEVICE KIT Please check your blood sugar once per day when you wake up. 05/23/15  Yes Mariel Aloe, MD  ELIQUIS 5 MG TABS tablet TAKE 1 TABLET (5 MG TOTAL) BY MOUTH 2 TIMES DAILY. 08/24/16  Yes Nicolette Bang, DO  glipiZIDE (GLUCOTROL) 5 MG tablet Take 1 tablet (5 mg total) by mouth daily before breakfast. 07/03/16  Yes Nicolette Bang, DO  glucose blood test strip 1 each by Other route daily before breakfast. 05/23/15  Yes Mariel Aloe, MD  hydrochlorothiazide (HYDRODIURIL) 25 MG tablet TAKE 1 TABLET (25 MG TOTAL) BY MOUTH DAILY. 11/16/16  Yes Nicolette Bang, DO  levothyroxine (SYNTHROID, LEVOTHROID) 50 MCG tablet TAKE 1 TABLET (50 MCG TOTAL) BY MOUTH DAILY. 08/24/16  Yes Nicolette Bang, DO  meloxicam (MOBIC) 15 MG tablet Take 15 mg by mouth daily.   Yes Historical Provider, MD  metoprolol (TOPROL-XL) 200 MG 24 hr tablet TAKE 1 TABLET (200 MG TOTAL) BY MOUTH DAILY. 11/16/16  Yes Nicolette Bang, DO  polyethylene glycol powder (GLYCOLAX/MIRALAX) powder Take 17 g by mouth daily. Patient taking differently: Take 17 g by mouth daily as needed for mild constipation.  12/17/14  Yes Mariel Aloe, MD  senna (SENOKOT) 8.6 MG tablet Take 2 tablets (17.2 mg total) by mouth 2 (two) times daily. Once achieve daily BM decrease to one tablet daily Patient taking differently: Take 2 tablets by mouth daily as needed for constipation.  10/24/13  Yes Waldemar Dickens, MD  valsartan (DIOVAN) 320 MG tablet TAKE 1 TABLET (320 MG TOTAL) BY MOUTH DAILY. 11/10/16  Yes Nicolette Bang, DO  vitamin B-12 (CYANOCOBALAMIN) 100 MCG tablet Take 100 mcg by mouth daily.   Yes Historical Provider, MD       Social History   Social History  . Marital status: Single    Spouse name: N/A  . Number of children: 3  . Years of education: 9/ GED   Occupational History  . retiredAdministrator, [employer] Service    Social History Main Topics  . Smoking status: Former Smoker    Quit date: 10/13/2011  . Smokeless tobacco: Never Used  . Alcohol use No  . Drug use: No  . Sexual activity: Not on file   Other Topics Concern  . Not on file   Social History Narrative   Health Care POA:    Emergency Contact: Daughter, Lurline Hare [phone]   End of Life Plan:    Who lives with you: Lives son.    Any pets: none   Diet: Patient currently does not follow  a diabetic diet plan.  She reports eating sugary foods often.   Exercise: Patient does not have a current exercise plan.   Seatbelts: Patient reports wearing her seatbelt when she is in vehicle.   Hobbies: Dancing, watching tv, swimming, singing                Family Status  Relation Status  . Mother Deceased  . Father Deceased  . Sister Deceased  .     Family History  Problem Relation Age of Onset  . Diabetes Mother   . Stomach cancer Sister   . Colon cancer      grandmother      ROS:  Full 14 point review of systems complete and found to be negative unless listed above.  Physical Exam: Blood pressure 140/79, pulse 63, temperature 97.5 F (36.4 C), temperature source Oral, resp. rate 19, height 5' 2.5" (1.588 m), weight 179 lb 14.4 oz (81.6 kg), SpO2 100 %.   General: Well developed, well nourished, female in no acute distress Head: Eyes PERRLA, No xanthomas.   Normocephalic and atraumatic, oropharynx without edema or exudate. Dentition:  Lungs: normal effort and rate of breathing. Heart:: normal rate and afib. Neck: No carotid bruits. No lymphadenopathy.  JVD. Abdomen: Bowel sounds present, abdomen soft and non-tender without masses or hernias noted. Msk:  No spine or cva tenderness. No weakness, no joint deformities or  effusions. Extremities: No clubbing or cyanosis.  edema.  Neuro: Alert and oriented X 3. No focal deficits noted. Psych:  Good affect, responds appropriately Skin: No rashes or lesions noted.  Labs:  Lab Results  Component Value Date   WBC 9.2 12/03/2016   HGB 10.7 (L) 12/03/2016   HCT 32.1 (L) 12/03/2016   MCV 81.1 12/03/2016   PLT 157 12/03/2016   No results for input(s): INR in the last 72 hours.  Recent Labs Lab 12/03/16 0409  NA 137  K 3.2*  CL 100*  CO2 28  BUN 13  CREATININE 0.96  CALCIUM 8.8*  GLUCOSE 243*   Recent Labs  12/02/16 1604 12/02/16 2141 12/03/16 0409  TROPONINI <0.03 <0.03 0.03*    Recent Labs  12/02/16 1139  TROPIPOC 0.00   No results found for: PROBNP Lab Results  Component Value Date   CHOL 140 12/03/2016   HDL 51 12/03/2016   LDLCALC 71 12/03/2016   TRIG 88 12/03/2016   TSH  Date/Time Value Ref Range Status  12/02/2016 04:04 PM 5.353 (H) 0.350 - 4.500 uIU/mL Final    Comment:    Performed by a 3rd Generation assay with a functional sensitivity of <=0.01 uIU/mL.  10/29/2016 03:15 PM 5.27 (H) mIU/L Final    Comment:      Reference Range   > or = 20 Years  0.40-4.50   Pregnancy Range First trimester  0.26-2.66 Second trimester 0.55-2.73 Third trimester  0.43-2.91      Vitamin B-12  Date/Time Value Ref Range Status  10/29/2016 03:15 PM 1,886 (H) 200 - 1,100 pg/mL Final    Echo:  Transthoracic Echocardiography 06/14/16  Study Conclusions  - Left ventricle: The cavity size was normal. There was moderate   focal basal hypertrophy. Systolic function was normal. The   estimated ejection fraction was in the range of 60% to 65%. Wall   motion was normal; there were no regional wall motion   abnormalities. The study was not technically sufficient to allow   evaluation of LV diastolic dysfunction due to atrial   fibrillation. -  Aortic valve: Trileaflet; mildly thickened, mildly calcified   leaflets. There was trivial  regurgitation. - Mitral valve: There was mild regurgitation. - Left atrium: The atrium was moderately dilated. - Tricuspid valve: There was trivial regurgitation.   ECG:  HR 63, Atrial flutter (12/03/16) - personally reviewed  Radiology:    Dg Chest 2 View Result Date: 12/02/2016 IMPRESSION: Mild chronic bronchitic changes.  No alveolar pneumonia nor CHF. Thoracic aortic atherosclerosis. Electronically Signed   By: David  Martinique M.D.   On: 12/02/2016 12:21   Assessment and Plan   1. Chest pain: Troponin neg x 3, 2D Echo done 9/17; EF of 60-65% with normal wall motion at that time. Due to the patients dementia the nature of her chest pain Plan is for [facility] today, pt is currently NPO  2. Atrial flutter: Currently in rate controlled a flutter   Pt may be becoming symptomatic from ongoing a flutter? -Continue Eliquis - Heart rate is rate controlled on tele review and without signs of uncontrolled rate, continue to monitor   3. Hypertension: 24 hour  (Min 138/77)   (Max 186/104) - cont HCTZ 25 mg , Irbesatan 300 mg, Metoprolol 200 mg - monitor BP  4. Hyperlipidemia:  - Cholesterol 140,  LDL 71,  Triglycerides 88 - Continue Lipitor 40 mg daily  5. Diabetes- Medicine to manage   Signed: Jena Gauss 12/03/2016 7:59 AM  Patient seen and independently examined with Delos Haring, PA. We discussed all aspects of the encounter. I agree with the assessment and plan as stated above.  Patient has no history of CAD but is followed by Dr. Marlou Porch for atrial flutter.  She was initially seen by him earlier this year and placed on Eliquis and was supposed to followup in 4 weeks but never did. She remains in atrial flutter with CVR on Eliquis.  Now admitted with episodes of exertional CP that is vague in description.  Her EKG shows nonspecific ST abnormality and Troponin is normal.  She had an echo a few months ago showing NSR. Will plan Leane Call today to rule out ischemia.     Signed: Fransico Him, MD [facility] 12/03/2016

## 2016-12-03 NOTE — Discharge Summary (Signed)
[facility] Discharge Summary  Patient name: Kristine Boyer Medical record number: [id number] Date of birth: 25-Sep-1941 Age: 76 y.o. Gender: female Date of Admission: 12/02/2016  Date of Discharge: 12/04/15 Admitting Physician: Alveda Reasons, MD  Primary Care Provider: Melina Schools, DO Consultants: cardiology  Indication for Hospitalization: chest pain  Discharge Diagnoses/Problem List:  Chest pain, resolved Atrial flutter Dementia  Disposition: Home  Discharge Condition: stable  Discharge Exam: please see progress note from day of discharge  Brief Hospital Course:  Mrs. Lindseth presented with intermittent chest pain worse with exertion described as squeezing. Troponins were not elevated. EKG with aflutter. Pain was improved without intervention. Cardiology was consulted, nuclear stress test performed without ischemia. Meloxicam was held due to chest pain and should be limited in the future.    Issues for Follow Up:  1. Ordered HH tub and shower seat.  2. Patient's family requesting medication for agitation. Started vistaril.  3. Cards recommended outpatient cardiology follow up.  Significant Procedures: nuclear stress test  Significant Labs and Imaging:   Recent Labs Lab 12/02/16 1131 12/03/16 0409  WBC 7.5 9.2  HGB 11.1* 10.7*  HCT 33.6* 32.1*  PLT 156 157    Recent Labs Lab 12/02/16 1131 12/03/16 0409  NA 138 137  K 3.8 3.2*  CL 98* 100*  CO2 31 28  GLUCOSE 316* 243*  BUN 10 13  CREATININE 1.03* 0.96  CALCIUM 9.2 8.8*    Trop <0.03, <0.03, 0.03 BNP 153.2  Results/Tests Pending at Time of Discharge: none  Discharge Medications:  Allergies as of 12/03/2016      Reactions   Lactose Intolerance (gi) Other (See Comments)   constipation   Vicodin [hydrocodone-acetaminophen] Itching   Neurontin [gabapentin] Other (See Comments)   Drowsy. Makes her feel "funny"      Medication List    STOP taking these medications    meloxicam 15 MG tablet Commonly known as:  MOBIC     TAKE these medications   ACCU-CHEK AVIVA PLUS w/Device Kit Please check your blood sugar once per day when you wake up.   ACCU-CHEK FASTCLIX LANCETS Misc 1 each by Does not apply route daily before breakfast.   aspirin 81 MG chewable tablet Chew 81 mg by mouth daily as needed for mild pain.   atorvastatin 40 MG tablet Commonly known as:  LIPITOR Take 1 tablet (40 mg total) by mouth daily.   docusate sodium 100 MG capsule Commonly known as:  COLACE Take 1 capsule (100 mg total) by mouth 2 (two) times daily.   ELIQUIS 5 MG Tabs tablet Generic drug:  apixaban TAKE 1 TABLET (5 MG TOTAL) BY MOUTH 2 TIMES DAILY.   glipiZIDE 5 MG tablet Commonly known as:  GLUCOTROL Take 1 tablet (5 mg total) by mouth daily before breakfast.   glucose blood test strip 1 each by Other route daily before breakfast.   hydrochlorothiazide 25 MG tablet Commonly known as:  HYDRODIURIL TAKE 1 TABLET (25 MG TOTAL) BY MOUTH DAILY.   levothyroxine 50 MCG tablet Commonly known as:  SYNTHROID, LEVOTHROID TAKE 1 TABLET (50 MCG TOTAL) BY MOUTH DAILY.   metoprolol 200 MG 24 hr tablet Commonly known as:  TOPROL-XL TAKE 1 TABLET (200 MG TOTAL) BY MOUTH DAILY.   polyethylene glycol powder powder Commonly known as:  GLYCOLAX/MIRALAX Take 17 g by mouth daily. What changed:  when to take this  reasons to take this   senna 8.6 MG tablet Commonly known as:  SENOKOT Take 2 tablets (17.2 mg total) by mouth 2 (two) times daily. Once achieve daily BM decrease to one tablet daily What changed:  when to take this  reasons to take this  additional instructions   valsartan 320 MG tablet Commonly known as:  DIOVAN TAKE 1 TABLET (320 MG TOTAL) BY MOUTH DAILY.   vitamin B-12 100 MCG tablet Commonly known as:  CYANOCOBALAMIN Take 100 mcg by mouth daily.            Durable Medical Equipment        Start     Ordered   12/03/16 0000  For  home use only DME Tub bench     12/03/16 1720      Discharge Instructions: Please refer to Patient Instructions section of EMR for full details.  Patient was counseled important signs and symptoms that should prompt return to medical care, changes in medications, dietary instructions, activity restrictions, and follow up appointments.   Follow-Up Appointments: Follow-up [place], [place]. Go on 12/08/2016.   Specialty:  Family Medicine Why:  2pm for hospital follow up Contact information: [address] [place] [address] [phone]           Sela Hilding, MD 12/04/2016, 6:20 PM PGY-1, [place]

## 2016-12-03 NOTE — Plan of Care (Signed)
Problem: Activity: Goal: Risk for activity intolerance will decrease Outcome: Adequate for Discharge Patient ambulates independently in her room with stand by assist. Patient will be going home with daughter per MD order.

## 2016-12-03 NOTE — Progress Notes (Signed)
Patient presented for Lexiscan. Tolerated procedure well. Pending final stress imaging result.  

## 2016-12-03 NOTE — Plan of Care (Signed)
Problem: Pain Managment: Goal: General experience of comfort will improve Outcome: Progressing Patient has had complaint of early morning chest pain, pt explained to the MD about sign and symptoms, will continue to monitor.

## 2016-12-03 NOTE — Progress Notes (Signed)
Inpatient Diabetes Program Recommendations  AACE/ADA: New Consensus Statement on Inpatient Glycemic Control (2015)  Target Ranges:  Prepandial:   less than 140 mg/dL      Peak postprandial:   less than 180 mg/dL (1-2 hours)      Critically ill patients:  140 - 180 mg/dL   Lab Results  Component Value Date   GLUCAP 262 (H) 12/03/2016   HGBA1C 10.9 (H) 12/02/2016    Review of Glycemic Control Results for Kristine Boyer, Kristine Boyer (MRN [id number]) as of 12/03/2016 11:47  Ref. Range 06/20/2016 07:36 06/20/2016 11:44 12/02/2016 16:14 12/02/2016 21:17 12/03/2016 07:54  Glucose-Capillary Latest Ref Range: 65 - 99 mg/dL 210 (H) 146 (H) 217 (H) 287 (H) 262 (H)   Diabetes history: DM2 Outpatient Diabetes medications: Glucotrol 5 mg qd Current orders for Inpatient glycemic control: Novolog correction 0-9 units tid  Inpatient Diabetes Program Recommendations:  While oral meds held, please consider adding basal insulin Lantus 0.2 x 81.6 kg =16 units daily. Will follow.  Thank you, Nani Gasser. Delrose Rohwer, RN, MSN, CDE Inpatient Glycemic Control Team Team Pager [phone] (8am-5pm) 12/03/2016 11:59 AM

## 2016-12-08 ENCOUNTER — Inpatient Hospital Stay: Payer: Self-pay | Admitting: Family Medicine

## 2016-12-16 ENCOUNTER — Other Ambulatory Visit: Payer: Self-pay | Admitting: Internal Medicine

## 2016-12-16 ENCOUNTER — Ambulatory Visit (INDEPENDENT_AMBULATORY_CARE_PROVIDER_SITE_OTHER): Payer: Medicare HMO | Admitting: Internal Medicine

## 2016-12-16 ENCOUNTER — Encounter: Payer: Self-pay | Admitting: Internal Medicine

## 2016-12-16 DIAGNOSIS — E1165 Type 2 diabetes mellitus with hyperglycemia: Secondary | ICD-10-CM

## 2016-12-16 DIAGNOSIS — R079 Chest pain, unspecified: Secondary | ICD-10-CM

## 2016-12-16 DIAGNOSIS — F5101 Primary insomnia: Secondary | ICD-10-CM | POA: Diagnosis not present

## 2016-12-16 DIAGNOSIS — IMO0001 Reserved for inherently not codable concepts without codable children: Secondary | ICD-10-CM

## 2016-12-16 MED ORDER — HYDROXYZINE HCL 25 MG PO TABS: 25.0000 mg | ORAL_TABLET | Freq: Three times a day (TID) | ORAL | 2 refills | Status: DC | PRN

## 2016-12-16 MED ORDER — TRAZODONE HCL 50 MG PO TABS: 25.0000 mg | ORAL_TABLET | Freq: Every evening | ORAL | 3 refills | Status: AC | PRN

## 2016-12-16 NOTE — Patient Instructions (Addendum)
Please make an appointment with Dr. Valentina Lucks, pharmacy, within the next couple of weeks to talk about a new diabetes regimen. Prior to this appointment, please check Ms. Oberle fasting CBG for 5-7 days in a row.   Please follow up with the heart doctor Dr. Marlou Porch.   Please return to see me in 3 months for your next check up or sooner if you have problems.   I have prescribed Atarax for anxiety/restlessness as needed. I have prescribed Trazodone to help her sleep.

## 2016-12-16 NOTE — Progress Notes (Signed)
Subjective:    Kristine Boyer - 76 y.o. female MRN [id number]  Date of birth: [birth year]  HPI  Kristine Boyer is here for hospital follow up.  Chest Pain: Was admitted for ACS rule out due to chest pain worsened with exertion described as a squeezing pressure. Troponins were negative during hospitalization. Patient with known Afib on Eliquis. EKG showed AFlutter. Cardiology was consulted. A nuclear stress test was performed that did not demonstrate any ischemia and normal LVF. Recommended patient have outpatient follow up with Dr. Marlou Porch for A-Flutter. Family not aware of need for follow up so appointment has not yet been scheduled. Patient reports chest pain occurs 3-4 times per week and is self-limited. Occurs both at rest and with exertion. Has associated sensation of her heart fluttering and skipping beats. Pain does not radiate elsewhere in her body. No associated SOB or diaphoresis.    T2DM: Patient with worsening of A1c. Was started on Glipizide 5 mg about 6 months ago and reports compliance. Does not check CBGs at home. Denies symptoms of hypoglycemia. Denies polyuria, significant nocturia, and nausea/vomiting.  Insomnia/Restlessness: Patient having trouble sleeping at night. Was given Trazodone while hospitalized and this worked well. Family members also inquiring about a medication she can take as needed for agitation during the day. Describes agitation more as her walking around and moving things around, having trouble sitting still.   Health Maintenance Due  Topic Date Due  . PNA vac Low Risk Adult (2 of 2 - PPSV23) 10/24/2014  . FOOT EXAM  11/16/2015  . OPHTHALMOLOGY EXAM  02/01/2016    -  reports that she quit smoking about 5 years ago. She has never used smokeless tobacco. - Review of Systems: Per HPI. - Past Medical History: Patient Active Problem List   Diagnosis Date Noted  . Insomnia 12/17/2016  . Nonspecific chest pain   . Atypical atrial flutter (Kristine Boyer)   . Essential  hypertension   . Chest pain 12/02/2016  . Incontinence 11/02/2016  . Hearing impairment, Left Ear 11/02/2016  . Dementia with behavioral disturbance 11/02/2016  . Atrial fibrillation ([place]) 06/18/2016  . Angina pectoris, unspecified 08/05/2015  . CN (constipation) 12/17/2014  . Dysphagia 08/28/2013  . Unspecified constipation 07/28/2013  . Forgetfulness 07/18/2013  . Breast nodule 01/03/2013  . Lung mass 08/05/2011  . OSA (obstructive sleep apnea) 06/12/2011  . Bulging disc 06/12/2011  . BPPV (benign paroxysmal positional vertigo) 05/18/2011  . BACK PAIN, LUMBAR 11/28/2009  . OBESITY 09/11/2008  . ALLERGIC RHINITIS 01/19/2008  . Hypothyroidism 11/25/2006  . Diabetes mellitus type 2, uncontrolled ([place]) 11/25/2006  . Hyperlipidemia 11/25/2006  . HYPERTENSION, BENIGN SYSTEMIC 11/25/2006  . GASTROESOPHAGEAL REFLUX, NO ESOPHAGITIS 11/25/2006  . OSTEOARTHRITIS, MULTI SITES 11/25/2006   - Medications: reviewed and updated   Objective:   Physical Exam BP (!) 142/78   Pulse 85   Temp 97.9 F (36.6 C) (Oral)   Ht 5\' 3"  (1.6 m)   Wt 181 lb 3.2 oz (82.2 kg)   SpO2 98%   BMI 32.10 kg/m  Gen: NAD, alert, cooperative with exam, well-appearing CV: irregular rhythm, regular rate, no murmurs appreciated, pain with palpation to left chest wall but patient unsure if it is the same pain she experiences at home  Resp: CTABL, no wheezes, non-labored  Assessment & Plan:   Chest pain With recent negative ischemic stress test. Suspect atypical chest pain potentially of msk etiology given ? Reproducibility on exam. May also stem from patient feeling her arrhythmia.  Patient is currently rate controlled on her metoprolol. Have recommended that patient make appointment with Dr. Marlou Porch as soon as possible. Discussed return precautions and signs of ACS.   Diabetes mellitus type 2, uncontrolled (HCC) Worsening of A1c from 8.9 to 10.9. Will elect not to increase Glipizide due to hypoglycemia risk  particularly in the elderly and patient not currently monitoring CBGs. Have asked patient to make an appointment within the next 1-2 weeks with Dr. Valentina Lucks for medication management. Have asked patient to begin to monitor fasting CBGs prior to this appointment.   Insomnia Will prescribe Trazodone 25-50 mg nightly prn for insomnia as patient has tolerated this well in the past. Will give Rx for Atarax 25 mg TID prn; could potentially increase dose if needed. Starting at lower dose given age.      Phill Myron, D.O. 12/17/2016, 2:02 PM PGY-2, [employer]

## 2016-12-16 NOTE — Assessment & Plan Note (Addendum)
With recent negative ischemic stress test. Suspect atypical chest pain potentially of msk etiology given ? Reproducibility on exam. May also stem from patient feeling her arrhythmia. Patient is currently rate controlled on her metoprolol. Have recommended that patient make appointment with Dr. Marlou Porch as soon as possible. Discussed return precautions and signs of ACS.

## 2016-12-16 NOTE — Assessment & Plan Note (Signed)
Worsening of A1c from 8.9 to 10.9. Will elect not to increase Glipizide due to hypoglycemia risk particularly in the elderly and patient not currently monitoring CBGs. Have asked patient to make an appointment within the next 1-2 weeks with Dr. Valentina Lucks for medication management. Have asked patient to begin to monitor fasting CBGs prior to this appointment.

## 2016-12-17 DIAGNOSIS — G4700 Insomnia, unspecified: Secondary | ICD-10-CM | POA: Insufficient documentation

## 2016-12-17 NOTE — Assessment & Plan Note (Signed)
Will prescribe Trazodone 25-50 mg nightly prn for insomnia as patient has tolerated this well in the past. Will give Rx for Atarax 25 mg TID prn; could potentially increase dose if needed. Starting at lower dose given age.

## 2017-01-20 ENCOUNTER — Telehealth: Payer: Self-pay
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
## 2017-01-20 NOTE — Telephone Encounter (Signed)
Discussed with daughter over the phone. Advised that she can take some Melatonin. I have scheduled an appointment for her to see Dr. Juleen China on 5/3.

## 2017-01-27 ENCOUNTER — Telehealth: Payer: Self-pay | Admitting: Internal Medicine

## 2017-01-27 NOTE — Telephone Encounter (Signed)
Pt's son would like Dr. Juleen China or Dr. McDiarmid to give him a call regarding memory testing. ep

## 2017-01-27 NOTE — Telephone Encounter (Signed)
Will forward to PCP.  Son is on patient's DPR form from [id number].  Jazmin Hartsell,CMA

## 2017-01-28 ENCOUNTER — Ambulatory Visit (INDEPENDENT_AMBULATORY_CARE_PROVIDER_SITE_OTHER): Payer: Medicare HMO | Admitting: Internal Medicine

## 2017-01-28 ENCOUNTER — Ambulatory Visit: Payer: Self-pay | Admitting: Internal Medicine

## 2017-01-28 ENCOUNTER — Encounter: Payer: Self-pay | Admitting: Internal Medicine

## 2017-01-28 VITALS — BP 158/88 | HR 65 | Temp 98.1°F | Ht 63.0 in | Wt 183.0 lb

## 2017-01-28 DIAGNOSIS — J309 Allergic rhinitis, unspecified: Secondary | ICD-10-CM

## 2017-01-28 DIAGNOSIS — R451 Restlessness and agitation: Secondary | ICD-10-CM | POA: Diagnosis not present

## 2017-01-29 NOTE — Assessment & Plan Note (Signed)
Exam and history consistent with allergic rhinitis. Suspect intermittent cough and mucus sensation in throat related to postnasal drip. Lung exam is clear, patient is afebrile, and patient has appropriate O2 saturation so very low suspicion for PNA. Recommended flonase and second generation anti-histamine.

## 2017-01-29 NOTE — Progress Notes (Signed)
Subjective:    Kristine Boyer - 76 y.o. female MRN [id number]  Date of birth: [birth year]  HPI  Kristine Boyer is here for SDA for congestion and agitation.  Congestion: Has a history of seasonal allergies and for the past few weeks has had worsening of nasal congestion, facial pressure, and itchy eyes. Has also been having sensation of mucus stuck in her throat and needs to cough frequently to clear it. Afebrile at home. No SOB or respiratory distress. Has not been taking any medications at home. No sick contacts.   Agitation: Patient with known history of dementia. Has had infrequent episodes of agitation and paranoia for several months but recently the frequency has been increasing. Family reports an episode several times per week to almost daily. Noticed that she becomes more upset with lots of people around and that when people are talking she becomes concerned that they are talking poorly about her. For example, there are young children who also receive care at their home and when one of the kids has a smelly diaper and a comment is made about this, Kristine Boyer becomes upset and worried that her family members are calling her smelly. The family is able to re-direct her and calm her down from these episodes. They give her Atarax when she becomes agitated but do not feel like it makes a big difference. She has not been a safety threat to herself or others. Has not been a flight risk. Family members have called [employer] and are attempting to find more daytime activities for patient. Patient herself reports she likes to keep busy and would enjoy having more activities.    -  reports that she quit smoking about 5 years ago. She has never used smokeless tobacco. - Review of Systems: Per HPI. - Past Medical History: Patient Active Problem List   Diagnosis Date Noted  . Insomnia 12/17/2016  . Nonspecific chest pain   . Atypical atrial flutter ([place])   . Essential hypertension   . Chest pain  12/02/2016  . Incontinence 11/02/2016  . Hearing impairment, Left Ear 11/02/2016  . Dementia with behavioral disturbance 11/02/2016  . Atrial fibrillation ([place]) 06/18/2016  . Angina pectoris, unspecified 08/05/2015  . CN (constipation) 12/17/2014  . Dysphagia 08/28/2013  . Unspecified constipation 07/28/2013  . Forgetfulness 07/18/2013  . Breast nodule 01/03/2013  . Lung mass 08/05/2011  . OSA (obstructive sleep apnea) 06/12/2011  . Bulging disc 06/12/2011  . BPPV (benign paroxysmal positional vertigo) 05/18/2011  . BACK PAIN, LUMBAR 11/28/2009  . OBESITY 09/11/2008  . Allergic rhinitis 01/19/2008  . Hypothyroidism 11/25/2006  . Diabetes mellitus type 2, uncontrolled ([place]) 11/25/2006  . Hyperlipidemia 11/25/2006  . HYPERTENSION, BENIGN SYSTEMIC 11/25/2006  . GASTROESOPHAGEAL REFLUX, NO ESOPHAGITIS 11/25/2006  . OSTEOARTHRITIS, MULTI SITES 11/25/2006   - Medications: reviewed and updated   Objective:   Physical Exam BP (!) 158/88   Pulse 65   Temp 98.1 F (36.7 C) (Oral)   Ht 5\' 3"  (1.6 m)   Wt 183 lb (83 kg)   SpO2 98%   BMI 32.42 kg/m  Gen: NAD, alert, cooperative with exam, well-appearing HEENT: NCAT, PERRL, clear conjunctiva, oropharynx clear, supple neck, TMs normal bilaterally, turbinates swollen bilaterally  CV: RRR, good S1/S2, no murmur, no edema, capillary refill brisk  Resp: CTABL, no wheezes, non-labored Psych: somewhat engaged in conversation but occasionally answers questions inappropriately or interjects on an unrelated topic     Assessment & Plan:   Allergic rhinitis  Exam and history consistent with allergic rhinitis. Suspect intermittent cough and mucus sensation in throat related to postnasal drip. Lung exam is clear, patient is afebrile, and patient has appropriate O2 saturation so very low suspicion for PNA. Recommended flonase and second generation anti-histamine.   Agitation  Worsening, likely related to confusion in the setting of known  dementia. Discussed with family that Atarax is a prn medication and given that they can not always predict when she is going to become agitated that it is difficult to reliability control symptoms with this medication. Discussed there are medications that can be used daily for agitation (such as Seroquel) but that these medications come with side effects. Family elected to not start any medications at present as they feel they can re-direct and re-assure patient adequately and that she is not a safety risk. Family is interested in starting process of looking into Memory Care Units or ALF. They would also like to have another appointment with Dr. Wendy Poet in [facility] to discuss prognosis and options. Will continue to monitor and support as needed.    Noted that patient's BP is elevated and she has had recent worsening of A1c. Had previously asked family to return for appointment with me or with Dr. Valentina Lucks for medication management. Re-iterated that she needs follow up with myself or [facility] for management of chronic medical conditions.    Phill Myron, D.O. 01/29/2017, 9:16 PM PGY-2, [place]

## 2017-02-02 ENCOUNTER — Other Ambulatory Visit: Payer: Self-pay | Admitting: Internal Medicine

## 2017-02-08 ENCOUNTER — Emergency Department: Payer: Medicare HMO

## 2017-02-08 ENCOUNTER — Observation Stay
Admission: EM | Admit: 2017-02-08 | Discharge: 2017-02-10 | Disposition: A | Discharge status: Home Health | Payer: Medicare HMO | Attending: Internal Medicine | Admitting: Internal Medicine

## 2017-02-08 DIAGNOSIS — G4700 Insomnia, unspecified: Secondary | ICD-10-CM | POA: Insufficient documentation

## 2017-02-08 DIAGNOSIS — E876 Hypokalemia: Secondary | ICD-10-CM | POA: Diagnosis not present

## 2017-02-08 DIAGNOSIS — I10 Essential (primary) hypertension: Secondary | ICD-10-CM | POA: Insufficient documentation

## 2017-02-08 DIAGNOSIS — Z87891 Personal history of nicotine dependence: Secondary | ICD-10-CM | POA: Diagnosis not present

## 2017-02-08 DIAGNOSIS — T790XXA Air embolism (traumatic), initial encounter: Secondary | ICD-10-CM

## 2017-02-08 DIAGNOSIS — Z885 Allergy status to narcotic agent status: Secondary | ICD-10-CM | POA: Insufficient documentation

## 2017-02-08 DIAGNOSIS — R1011 Right upper quadrant pain: Secondary | ICD-10-CM

## 2017-02-08 DIAGNOSIS — E039 Hypothyroidism, unspecified: Secondary | ICD-10-CM | POA: Diagnosis not present

## 2017-02-08 DIAGNOSIS — N289 Disorder of kidney and ureter, unspecified: Secondary | ICD-10-CM | POA: Diagnosis not present

## 2017-02-08 DIAGNOSIS — Z7984 Long term (current) use of oral hypoglycemic drugs: Secondary | ICD-10-CM | POA: Diagnosis not present

## 2017-02-08 DIAGNOSIS — I959 Hypotension, unspecified: Secondary | ICD-10-CM | POA: Diagnosis not present

## 2017-02-08 DIAGNOSIS — I6789 Other cerebrovascular disease: Secondary | ICD-10-CM | POA: Diagnosis not present

## 2017-02-08 DIAGNOSIS — E785 Hyperlipidemia, unspecified: Secondary | ICD-10-CM | POA: Diagnosis not present

## 2017-02-08 DIAGNOSIS — M25551 Pain in right hip: Secondary | ICD-10-CM | POA: Diagnosis not present

## 2017-02-08 DIAGNOSIS — M6281 Muscle weakness (generalized): Secondary | ICD-10-CM | POA: Diagnosis not present

## 2017-02-08 DIAGNOSIS — E119 Type 2 diabetes mellitus without complications: Secondary | ICD-10-CM | POA: Insufficient documentation

## 2017-02-08 DIAGNOSIS — R21 Rash and other nonspecific skin eruption: Secondary | ICD-10-CM

## 2017-02-08 DIAGNOSIS — S70321A Blister (nonthermal), right thigh, initial encounter: Secondary | ICD-10-CM | POA: Insufficient documentation

## 2017-02-08 DIAGNOSIS — L989 Disorder of the skin and subcutaneous tissue, unspecified: Secondary | ICD-10-CM | POA: Diagnosis not present

## 2017-02-08 DIAGNOSIS — E739 Lactose intolerance, unspecified: Secondary | ICD-10-CM | POA: Insufficient documentation

## 2017-02-08 DIAGNOSIS — R251 Tremor, unspecified: Secondary | ICD-10-CM | POA: Diagnosis not present

## 2017-02-08 DIAGNOSIS — R112 Nausea with vomiting, unspecified: Secondary | ICD-10-CM

## 2017-02-08 DIAGNOSIS — G309 Alzheimer's disease, unspecified: Secondary | ICD-10-CM | POA: Insufficient documentation

## 2017-02-08 DIAGNOSIS — T797XXA Traumatic subcutaneous emphysema, initial encounter: Secondary | ICD-10-CM | POA: Diagnosis not present

## 2017-02-08 DIAGNOSIS — R42 Dizziness and giddiness: Secondary | ICD-10-CM | POA: Insufficient documentation

## 2017-02-08 DIAGNOSIS — R55 Syncope and collapse: Secondary | ICD-10-CM | POA: Diagnosis not present

## 2017-02-08 DIAGNOSIS — R8281 Pyuria: Secondary | ICD-10-CM

## 2017-02-08 DIAGNOSIS — S0990XA Unspecified injury of head, initial encounter: Secondary | ICD-10-CM | POA: Diagnosis not present

## 2017-02-08 DIAGNOSIS — I4891 Unspecified atrial fibrillation: Secondary | ICD-10-CM | POA: Insufficient documentation

## 2017-02-08 DIAGNOSIS — G4733 Obstructive sleep apnea (adult) (pediatric): Secondary | ICD-10-CM | POA: Diagnosis not present

## 2017-02-08 DIAGNOSIS — R52 Pain, unspecified: Secondary | ICD-10-CM

## 2017-02-08 DIAGNOSIS — X58XXXA Exposure to other specified factors, initial encounter: Secondary | ICD-10-CM | POA: Diagnosis not present

## 2017-02-08 DIAGNOSIS — K219 Gastro-esophageal reflux disease without esophagitis: Secondary | ICD-10-CM | POA: Insufficient documentation

## 2017-02-08 DIAGNOSIS — T670XXA Heatstroke and sunstroke, initial encounter: Secondary | ICD-10-CM | POA: Diagnosis not present

## 2017-02-08 DIAGNOSIS — R4189 Other symptoms and signs involving cognitive functions and awareness: Secondary | ICD-10-CM

## 2017-02-08 DIAGNOSIS — F0281 Dementia in other diseases classified elsewhere with behavioral disturbance: Secondary | ICD-10-CM | POA: Insufficient documentation

## 2017-02-08 DIAGNOSIS — R531 Weakness: Secondary | ICD-10-CM | POA: Diagnosis not present

## 2017-02-08 DIAGNOSIS — R402441 Other coma, without documented Glasgow coma scale score, or with partial score reported, in the field [EMT or ambulance]: Secondary | ICD-10-CM | POA: Diagnosis not present

## 2017-02-08 DIAGNOSIS — N390 Urinary tract infection, site not specified: Secondary | ICD-10-CM | POA: Diagnosis not present

## 2017-02-08 DIAGNOSIS — S199XXA Unspecified injury of neck, initial encounter: Secondary | ICD-10-CM | POA: Diagnosis not present

## 2017-02-08 DIAGNOSIS — I639 Cerebral infarction, unspecified: Secondary | ICD-10-CM

## 2017-02-08 LAB — CBC WITH DIFFERENTIAL/PLATELET
BASOS ABS: 0 10*3/uL (ref 0–0.1)
BASOS PCT: 0 %
EOS ABS: 0.1 10*3/uL (ref 0–0.7)
EOS PCT: 1 %
HCT: 33.5 % — ABNORMAL LOW (ref 35.0–47.0)
Hemoglobin: 11 g/dL — ABNORMAL LOW (ref 12.0–16.0)
Lymphocytes Relative: 8 %
Lymphs Abs: 0.9 10*3/uL — ABNORMAL LOW (ref 1.0–3.6)
MCH: 27.1 pg (ref 26.0–34.0)
MCHC: 32.8 g/dL (ref 32.0–36.0)
MCV: 82.5 fL (ref 80.0–100.0)
Monocytes Absolute: 0.9 10*3/uL (ref 0.2–0.9)
Monocytes Relative: 8 %
Neutro Abs: 9.1 10*3/uL — ABNORMAL HIGH (ref 1.4–6.5)
Neutrophils Relative %: 83 %
PLATELETS: 141 10*3/uL — AB (ref 150–440)
RBC: 4.06 MIL/uL (ref 3.80–5.20)
RDW: 13.8 % (ref 11.5–14.5)
WBC: 11 10*3/uL (ref 3.6–11.0)

## 2017-02-08 LAB — URINALYSIS, COMPLETE (UACMP) WITH MICROSCOPIC
Bilirubin Urine: NEGATIVE
Hgb urine dipstick: NEGATIVE
KETONES UR: NEGATIVE mg/dL
Leukocytes, UA: NEGATIVE
NITRITE: NEGATIVE
PH: 5 (ref 5.0–8.0)
PROTEIN: NEGATIVE mg/dL
Specific Gravity, Urine: 1.009 (ref 1.005–1.030)

## 2017-02-08 LAB — COMPREHENSIVE METABOLIC PANEL
ALK PHOS: 86 U/L (ref 38–126)
ALT: 20 U/L (ref 14–54)
ANION GAP: 6 (ref 5–15)
AST: 31 U/L (ref 15–41)
Albumin: 3.9 g/dL (ref 3.5–5.0)
BUN: 26 mg/dL — ABNORMAL HIGH (ref 6–20)
CALCIUM: 8.8 mg/dL — AB (ref 8.9–10.3)
CO2: 29 mmol/L (ref 22–32)
Chloride: 104 mmol/L (ref 101–111)
Creatinine, Ser: 1.35 mg/dL — ABNORMAL HIGH (ref 0.44–1.00)
GFR calc non Af Amer: 37 mL/min — ABNORMAL LOW (ref >60)
GFR, EST AFRICAN AMERICAN: 43 mL/min — AB (ref >60)
GLUCOSE: 315 mg/dL — AB (ref 65–99)
Potassium: 3.5 mmol/L (ref 3.5–5.1)
SODIUM: 139 mmol/L (ref 135–145)
Total Bilirubin: 0.6 mg/dL (ref 0.3–1.2)
Total Protein: 7.7 g/dL (ref 6.5–8.1)

## 2017-02-08 LAB — GLUCOSE, CAPILLARY
Glucose-Capillary: 316 mg/dL — ABNORMAL HIGH (ref 65–99)
Glucose-Capillary: 242 mg/dL — ABNORMAL HIGH (ref 65–99)

## 2017-02-08 LAB — LIPASE, BLOOD: Lipase: 19 U/L (ref 11–51)

## 2017-02-08 LAB — CK: Total CK: 177 U/L (ref 38–234)

## 2017-02-08 LAB — TROPONIN I

## 2017-02-08 MED ORDER — SENNOSIDES 8.6 MG PO TABS: 2.0000 | ORAL_TABLET | Freq: Every day | ORAL | Status: DC | PRN

## 2017-02-08 MED ORDER — PIPERACILLIN-TAZOBACTAM 3.375 G IVPB: 3.3750 g | Freq: Three times a day (TID) | INTRAVENOUS | Status: DC

## 2017-02-08 MED ORDER — ONDANSETRON HCL 4 MG/2ML IJ SOLN
4.0000 mg | Freq: Four times a day (QID) | INTRAMUSCULAR | Status: DC | PRN
  Administered 2017-02-10: 4 mg via INTRAVENOUS
  Filled 2017-02-08: qty 2

## 2017-02-08 MED ORDER — METOPROLOL SUCCINATE ER 100 MG PO TB24
100.0000 mg | ORAL_TABLET | Freq: Every day | ORAL | Status: DC
  Administered 2017-02-09 – 2017-02-10 (×2): 100 mg via ORAL
  Filled 2017-02-08 (×2): qty 1

## 2017-02-08 MED ORDER — IOPAMIDOL (ISOVUE-300) INJECTION 61%
75.0000 mL | Freq: Once | INTRAVENOUS | Status: AC | PRN
  Administered 2017-02-08: 75 mL via INTRAVENOUS

## 2017-02-08 MED ORDER — HYDROXYZINE HCL 25 MG PO TABS
25.0000 mg | ORAL_TABLET | Freq: Three times a day (TID) | ORAL | Status: DC | PRN
  Filled 2017-02-08: qty 1

## 2017-02-08 MED ORDER — GLIPIZIDE 5 MG PO TABS
5.0000 mg | ORAL_TABLET | Freq: Every day | ORAL | Status: DC
  Administered 2017-02-09 – 2017-02-10 (×2): 5 mg via ORAL
  Filled 2017-02-08 (×2): qty 1

## 2017-02-08 MED ORDER — METOPROLOL SUCCINATE ER 50 MG PO TB24: 100.0000 mg | ORAL_TABLET | Freq: Every day | ORAL | Status: DC

## 2017-02-08 MED ORDER — ATORVASTATIN CALCIUM 20 MG PO TABS
40.0000 mg | ORAL_TABLET | Freq: Every day | ORAL | Status: DC
  Administered 2017-02-08 – 2017-02-10 (×3): 40 mg via ORAL
  Filled 2017-02-08 (×3): qty 2

## 2017-02-08 MED ORDER — PIPERACILLIN-TAZOBACTAM 3.375 G IVPB
3.3750 g | INTRAVENOUS | Status: DC
  Filled 2017-02-08: qty 50

## 2017-02-08 MED ORDER — SODIUM CHLORIDE 0.9 % IV SOLN
INTRAVENOUS | Status: DC
  Administered 2017-02-08: 23:00:00 via INTRAVENOUS

## 2017-02-08 MED ORDER — APIXABAN 5 MG PO TABS
5.0000 mg | ORAL_TABLET | Freq: Two times a day (BID) | ORAL | Status: DC
  Administered 2017-02-08 – 2017-02-10 (×4): 5 mg via ORAL
  Filled 2017-02-08 (×4): qty 1

## 2017-02-08 MED ORDER — SODIUM CHLORIDE 0.9 % IV BOLUS (SEPSIS)
1000.0000 mL | Freq: Once | INTRAVENOUS | Status: AC
  Administered 2017-02-08: 1000 mL via INTRAVENOUS

## 2017-02-08 MED ORDER — LEVOTHYROXINE SODIUM 50 MCG PO TABS
50.0000 ug | ORAL_TABLET | Freq: Every day | ORAL | Status: DC
  Administered 2017-02-09 – 2017-02-10 (×2): 50 ug via ORAL
  Filled 2017-02-08 (×2): qty 1

## 2017-02-08 MED ORDER — TRAZODONE HCL 50 MG PO TABS
25.0000 mg | ORAL_TABLET | Freq: Every evening | ORAL | Status: DC | PRN
  Administered 2017-02-08 – 2017-02-09 (×2): 50 mg via ORAL
  Filled 2017-02-08 (×2): qty 1

## 2017-02-08 MED ORDER — INSULIN ASPART 100 UNIT/ML ~~LOC~~ SOLN
0.0000 [IU] | Freq: Three times a day (TID) | SUBCUTANEOUS | Status: DC
  Administered 2017-02-09: 1 [IU] via SUBCUTANEOUS
  Administered 2017-02-10: 2 [IU] via SUBCUTANEOUS
  Administered 2017-02-10: 3 [IU] via SUBCUTANEOUS
  Filled 2017-02-08: qty 3
  Filled 2017-02-08: qty 1
  Filled 2017-02-08: qty 2

## 2017-02-08 MED ORDER — MELATONIN 5 MG PO TABS
10.0000 mg | ORAL_TABLET | Freq: Every day | ORAL | Status: DC
  Administered 2017-02-08 – 2017-02-09 (×2): 10 mg via ORAL
  Filled 2017-02-08 (×3): qty 2

## 2017-02-08 MED ORDER — POLYETHYLENE GLYCOL 3350 17 G PO PACK: 17.0000 g | PACK | Freq: Every day | ORAL | Status: DC | PRN

## 2017-02-08 MED ORDER — POLYETHYLENE GLYCOL 3350 17 GM/SCOOP PO POWD
17.0000 g | Freq: Every day | ORAL | Status: DC | PRN
  Filled 2017-02-08: qty 255

## 2017-02-08 MED ORDER — METOPROLOL SUCCINATE ER 50 MG PO TB24: 200.0000 mg | ORAL_TABLET | Freq: Every day | ORAL | Status: DC

## 2017-02-08 MED ORDER — ONDANSETRON HCL 4 MG PO TABS: 4.0000 mg | ORAL_TABLET | Freq: Four times a day (QID) | ORAL | Status: DC | PRN

## 2017-02-08 MED ORDER — VANCOMYCIN HCL IN DEXTROSE 750-5 MG/150ML-% IV SOLN: 750.0000 mg | INTRAVENOUS | Status: DC

## 2017-02-08 MED ORDER — SODIUM CHLORIDE 0.9 % IV BOLUS (SEPSIS)
500.0000 mL | Freq: Once | INTRAVENOUS | Status: AC
  Administered 2017-02-08: 500 mL via INTRAVENOUS

## 2017-02-08 MED ORDER — IRBESARTAN 300 MG PO TABS: 300.0000 mg | ORAL_TABLET | Freq: Every day | ORAL | Status: DC

## 2017-02-08 MED ORDER — INSULIN ASPART 100 UNIT/ML ~~LOC~~ SOLN
0.0000 [IU] | Freq: Every day | SUBCUTANEOUS | Status: DC
  Administered 2017-02-08: 2 [IU] via SUBCUTANEOUS
  Filled 2017-02-08: qty 2

## 2017-02-08 MED ORDER — SILVER SULFADIAZINE 1 % EX CREA
TOPICAL_CREAM | CUTANEOUS | Status: AC
  Administered 2017-02-08: 1 via TOPICAL
  Filled 2017-02-08: qty 85

## 2017-02-08 MED ORDER — CEFAZOLIN SODIUM-DEXTROSE 1-4 GM/50ML-% IV SOLN
1.0000 g | Freq: Three times a day (TID) | INTRAVENOUS | Status: DC
  Administered 2017-02-08 – 2017-02-10 (×5): 1 g via INTRAVENOUS
  Filled 2017-02-08 (×5): qty 50

## 2017-02-08 MED ORDER — DOCUSATE SODIUM 100 MG PO CAPS
100.0000 mg | ORAL_CAPSULE | Freq: Two times a day (BID) | ORAL | Status: DC
  Administered 2017-02-08 – 2017-02-09 (×3): 100 mg via ORAL
  Filled 2017-02-08 (×4): qty 1

## 2017-02-08 MED ORDER — SILVER SULFADIAZINE 1 % EX CREA
TOPICAL_CREAM | Freq: Once | CUTANEOUS | Status: AC
  Administered 2017-02-08: 1 via TOPICAL

## 2017-02-08 MED ORDER — SENNA 8.6 MG PO TABS: 2.0000 | ORAL_TABLET | Freq: Every day | ORAL | Status: DC | PRN

## 2017-02-08 MED ORDER — ACETAMINOPHEN 650 MG RE SUPP: 650.0000 mg | Freq: Four times a day (QID) | RECTAL | Status: DC | PRN

## 2017-02-08 MED ORDER — SODIUM CHLORIDE 0.9 % IV SOLN
1250.0000 mg | INTRAVENOUS | Status: DC
  Filled 2017-02-08: qty 1250

## 2017-02-08 MED ORDER — ACETAMINOPHEN 325 MG PO TABS
650.0000 mg | ORAL_TABLET | Freq: Four times a day (QID) | ORAL | Status: DC | PRN
  Administered 2017-02-08: 650 mg via ORAL
  Filled 2017-02-08: qty 2
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
## 2017-02-08 NOTE — ED Notes (Signed)
Pt has been in scans since arrival. Will draw blood and do EKG once pt returns.

## 2017-02-08 NOTE — ED Notes (Signed)
Pt placed on bedpan and urinated.

## 2017-02-08 NOTE — ED Notes (Signed)
Admitting MD at bedside.

## 2017-02-08 NOTE — ED Notes (Signed)
Pt ambulatory to toilet

## 2017-02-08 NOTE — Progress Notes (Signed)
Pharmacy Antibiotic Note  Kristine Boyer is a 76 y.o. 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  Pharmacy has been consulted for Vancomycin and Zosyn dosing.  Plan: Will give Vancomycin 1250 mg IV x 1 (~19 mg/kg) then will start Vancomycin 750 mg IV q18 hours. Will order Vancomycin Trough level prior to the 0400 dose on 5/19 which will be 4.5 half lives.   Zosyn: Will start Zosyn 3.375 g IV q8 hours.      Temp (24hrs), Avg:98.3 F (36.8 C), Min:98.3 F (36.8 C), Max:98.3 F (36.8 C)   Recent Labs Lab 02/08/17 1355 02/08/17 1603  WBC 11.0  --   CREATININE  --  1.35*    Estimated Creatinine Clearance: 36.7 mL/min (A) (by C-G formula based on SCr of 1.35 mg/dL (H)).    Allergies  Allergen Reactions  . Lactose Intolerance (Gi) Other (See Comments)    constipation  . Vicodin [Hydrocodone-Acetaminophen] Itching  . Neurontin [Gabapentin] Other (See Comments)    Drowsy. Makes her feel "funny"    Antimicrobials this admission: Vancomycin  5/14 >>  Zosyn 5/14 >>   Dose adjustments this admission:   Microbiology results:  BCx:   UCx:    Sputum:    MRSA PCR:   Thank you for allowing pharmacy to be a part of this patient's care.  Leanor Voris D 02/08/2017 7:37 PM

## 2017-02-08 NOTE — H&P (Signed)
[facility] [place] [facility] [place] NAME: Kristine Boyer    MR#:  [id number]  DATE OF BIRTH:  02-26-1941  DATE OF ADMISSION:  02/08/2017  PRIMARY CARE PHYSICIAN: Nicolette Bang, DO   REQUESTING/REFERRING PHYSICIAN: Dr. Suezanne Jacquet  CHIEF COMPLAINT:   Chief Complaint  Patient presents with  . Altered Mental Status    HISTORY OF PRESENT ILLNESS:  Kristine Boyer  is a 76 y.o. female brought in to the hospital after being found outside sleeping with some vomit on her shirt. She was out in the sun. Normally she sits in the shade. She was out there at least for 2 hours. She was found to have a blister in her right groin area and some redness around her neck. On CT scan she was found to have some subcutaneous air in the neck. And some redness around the neck. Hospitalist services contacted for further evaluation  PAST MEDICAL HISTORY:   Past Medical History:  Diagnosis Date  . Abdominal pain 10/04/2013  . Adenomatous colon polyp   . Allergy   . Altered sensation, foot 04/26/2012  . Alzheimer disease   . Anemia   . Bipolar affective ([place])   . Blood transfusion without reported diagnosis   . BPPV (benign paroxysmal positional vertigo) 05/18/2011  . Cataract   . Chest pain 06/18/2016  . De Quervain's syndrome (tenosynovitis) 02/23/2011   Left wrist. Has worked w/ sports medicine and received steroid injection w/o relief.   . Diabetes mellitus   . Dysphagia 08/28/2013  . Dysuria 03/21/2014  . Flank pain 08/28/2013  . Foot pain, right 11/11/2010  . GERD (gastroesophageal reflux disease)   . Hyperlipidemia   . Hypertension   . Hypokalemia 03/18/2011   Takes potassium pills if develops muscle cramps. Eats lots of bananas. 11/2011   . Hypothyroidism   . Immune deficiency disorder ([place])   . Internal hemorrhoids   . Leg swelling 02/14/2013  . Mass of oropharynx 05/15/2014  . Musculoskeletal pain 08/16/2012  . Scalp pain 01/16/2015  . Sciatica  10/08/2014  . Seizures ([place])   . Stiffness of neck 05/16/2014    PAST SURGICAL HISTORY:   Past Surgical History:  Procedure Laterality Date  . ABDOMINAL HYSTERECTOMY    . TONSILECTOMY, ADENOIDECTOMY, BILATERAL MYRINGOTOMY AND TUBES      SOCIAL HISTORY:   Social History  Substance Use Topics  . Smoking status: Former Smoker    Quit date: 10/13/2011  . Smokeless tobacco: Never Used  . Alcohol use No    FAMILY HISTORY:   Family History  Problem Relation Age of Onset  . Diabetes Mother   . CAD Mother   . Alcohol abuse Father   . Stomach cancer Sister   . Colon cancer Unknown        grandmother    DRUG ALLERGIES:   Allergies  Allergen Reactions  . Lactose Intolerance (Gi) Other (See Comments)    constipation  . Vicodin [Hydrocodone-Acetaminophen] Itching  . Neurontin [Gabapentin] Other (See Comments)    Drowsy. Makes her feel "funny"    REVIEW OF SYSTEMS:  CONSTITUTIONAL: No fever. Positive for fatigue.  EYES: No blurred or double vision.  EARS, NOSE, AND THROAT: No tinnitus or ear pain. Positive for sore throat. Positive for runny nose. RESPIRATORY: Positive for cough, shortness of breath, wheezing or hemoptysis.  CARDIOVASCULAR: No chest pain, orthopnea, edema.  GASTROINTESTINAL: Positive for nausea, vomiting. No diarrhea or abdominal pain. No blood in bowel movements GENITOURINARY: No  dysuria, hematuria.  ENDOCRINE: No polyuria, nocturia,  HEMATOLOGY: No anemia, easy bruising or bleeding SKIN: Positive for blister right groin. Positive for redness on the neck. MUSCULOSKELETAL: Positive for pains all over.   NEUROLOGIC: No tingling, numbness, weakness.  PSYCHIATRY: No anxiety or depression.   MEDICATIONS AT HOME:   Prior to Admission medications   Medication Sig Start Date End Date Taking? Authorizing Provider  atorvastatin (LIPITOR) 40 MG tablet Take 1 tablet (40 mg total) by mouth daily. 06/22/16  Yes Nicolette Bang, DO  docusate sodium (COLACE)  100 MG capsule Take 1 capsule (100 mg total) by mouth 2 (two) times daily. 12/03/16  Yes Sela Hilding, MD  ELIQUIS 5 MG TABS tablet TAKE 1 TABLET (5 MG TOTAL) BY MOUTH 2 TIMES DAILY. 08/24/16  Yes Nicolette Bang, DO  glipiZIDE (GLUCOTROL) 5 MG tablet Take 1 tablet (5 mg total) by mouth daily before breakfast. 07/03/16  Yes Nicolette Bang, DO  hydrochlorothiazide (HYDRODIURIL) 25 MG tablet TAKE 1 TABLET (25 MG TOTAL) BY MOUTH DAILY. 11/16/16  Yes Nicolette Bang, DO  hydrOXYzine (ATARAX/VISTARIL) 25 MG tablet Take 1 tablet (25 mg total) by mouth 3 (three) times daily as needed. 12/16/16  Yes Nicolette Bang, DO  levothyroxine (SYNTHROID, LEVOTHROID) 50 MCG tablet TAKE 1 TABLET (50 MCG TOTAL) BY MOUTH DAILY. 12/16/16  Yes Nicolette Bang, DO  Melatonin 10 MG TABS Take 10 mg by mouth at bedtime.   Yes [provider]  metoprolol (TOPROL-XL) 200 MG 24 hr tablet TAKE 1 TABLET (200 MG TOTAL) BY MOUTH DAILY. 02/02/17  Yes Nicolette Bang, DO  polyethylene glycol powder (GLYCOLAX/MIRALAX) powder Take 17 g by mouth daily. Patient taking differently: Take 17 g by mouth daily as needed for mild constipation.  12/17/14  Yes Mariel Aloe, MD  senna (SENOKOT) 8.6 MG tablet Take 2 tablets (17.2 mg total) by mouth 2 (two) times daily. Once achieve daily BM decrease to one tablet daily Patient taking differently: Take 2 tablets by mouth daily as needed for constipation.  10/24/13  Yes Waldemar Dickens, MD  traZODone (DESYREL) 50 MG tablet Take 0.5-1 tablets (25-50 mg total) by mouth at bedtime as needed for sleep. 12/16/16  Yes Nicolette Bang, DO  valsartan (DIOVAN) 320 MG tablet TAKE 1 TABLET (320 MG TOTAL) BY MOUTH DAILY. 11/10/16  Yes Nicolette Bang, DO  ACCU-CHEK FASTCLIX LANCETS MISC 1 each by Does not apply route daily before breakfast. 05/23/15   Mariel Aloe, MD  Blood Glucose Monitoring Suppl (ACCU-CHEK AVIVA PLUS)  W/DEVICE KIT Please check your blood sugar once per day when you wake up. 05/23/15   Mariel Aloe, MD  glucose blood test strip 1 each by Other route daily before breakfast. 05/23/15   Mariel Aloe, MD      VITAL SIGNS:  Blood pressure 102/86, pulse 89, temperature 98.3 F (36.8 C), temperature source Oral, resp. rate 15, SpO2 98 %.  PHYSICAL EXAMINATION:  GENERAL:  76 y.o.-year-old patient lying in the bed with no acute distress.  EYES: Pupils equal, round, reactive to light and accommodation. No scleral icterus. Extraocular muscles intact.  HEENT: Head atraumatic, normocephalic. Oropharynx and nasopharynx clear.  NECK:  Supple, no jugular venous distention. No thyroid enlargement, no tenderness.  LUNGS: Normal breath sounds bilaterally, no wheezing, rales,rhonchi or crepitation. No use of accessory muscles of respiration.  CARDIOVASCULAR: S1, S2 normal. No murmurs, rubs, or gallops.  ABDOMEN: Soft, nontender, nondistended. Bowel sounds present. No  organomegaly or mass.  EXTREMITIES: No pedal edema, cyanosis, or clubbing.  NEUROLOGIC: Cranial nerves II through XII are intact. Muscle strength 5/5 in all extremities. Sensation intact. Gait not checked.  PSYCHIATRIC: The patient is alert and oriented x 3.  SKIN: Large blister in right groin with slight erythema surrounding. Neck slight erythema. No crepitus in palpating over the neck.  LABORATORY PANEL:   CBC  Recent Labs Lab 02/08/17 1355  WBC 11.0  HGB 11.0*  HCT 33.5*  PLT 141*   ------------------------------------------------------------------------------------------------------------------  Chemistries   Recent Labs Lab 02/08/17 1603  NA 139  K 3.5  CL 104  CO2 29  GLUCOSE 315*  BUN 26*  CREATININE 1.35*  CALCIUM 8.8*  AST 31  ALT 20  ALKPHOS 86  BILITOT 0.6   ------------------------------------------------------------------------------------------------------------------  Cardiac Enzymes  Recent  Labs Lab 02/08/17 1603  TROPONINI <0.03   ------------------------------------------------------------------------------------------------------------------  RADIOLOGY:  Dg Chest 2 View  Result Date: 02/08/2017 CLINICAL DATA:  Found unresponsive. EXAM: CHEST  2 VIEW COMPARISON:  12/02/2016 FINDINGS: The heart size and mediastinal contours are within normal limits. Low bilateral lung volumes with bibasilar atelectasis. There is no evidence of pulmonary edema, consolidation, pneumothorax, nodule or pleural fluid. Stable degenerative disease of the thoracic spine. IMPRESSION: No active cardiopulmonary disease. Electronically Signed   By: Aletta Edouard M.D.   On: 02/08/2017 14:39   Ct Head Wo Contrast  Result Date: 02/08/2017 CLINICAL DATA:  76 year old female found down on the floor today. EXAM: CT HEAD WITHOUT CONTRAST CT CERVICAL SPINE WITHOUT CONTRAST TECHNIQUE: Multidetector CT imaging of the head and cervical spine was performed following the standard protocol without intravenous contrast. Multiplanar CT image reconstructions of the cervical spine were also generated. COMPARISON:  Head CT 11/05/2016.  Neck CT 05/16/2014. FINDINGS: CT HEAD FINDINGS Brain: Mild cerebral atrophy. Patchy and confluent areas of decreased attenuation are noted throughout the deep and periventricular white matter of the cerebral hemispheres bilaterally, compatible with chronic microvascular ischemic disease. No evidence of acute infarction, hemorrhage, hydrocephalus, extra-axial collection or mass lesion/mass effect. Vascular: No hyperdense vessel or unexpected calcification. Skull: Normal. Negative for fracture or focal lesion. Sinuses/Orbits: No acute finding. Other: Multiple small locules of gas are noted in the deep soft tissues of the face. CT CERVICAL SPINE FINDINGS Alignment: Normal. Skull base and vertebrae: No acute fracture. No primary bone lesion or focal pathologic process. Soft tissues and spinal canal: No  prevertebral fluid or swelling. No visible canal hematoma. Disc levels: Moderate multilevel degenerative disc disease, most pronounced at C5-C6. Mild multilevel facet arthropathy. Anterior bridging osteophytes throughout the cervical spine, compatible with underlying diffuse idiopathic skeletal hyperostosis. This is most pronounced at C1-C2 and C3-C4. Upper chest: Again noted is some scarring in the visualized lung apices, most evident in the right upper lobe where there is some thickening of the peribronchovascular interstitium and some peripheral bronchiolectasis, similar to prior study from 2015. Other: Gas throughout the venous system of the neck. No non-venous gas is identified in the soft tissues of the neck. IMPRESSION: 1. No acute intracranial abnormalities. 2. No evidence of acute traumatic injury to the cervical spine. 3. Mild cerebral atrophy with extensive chronic microvascular ischemic changes in the cerebral white matter, as above. 4. Extensive venous gas noted throughout the cervical region and in the deep soft tissues of the face, presumably iatrogenic related to recent IV placement. 5. DISH. Electronically Signed   By: Vinnie Langton M.D.   On: 02/08/2017 14:34   Ct Cervical Spine Wo  Contrast  Result Date: 02/08/2017 CLINICAL DATA:  76 year old female found down on the floor today. EXAM: CT HEAD WITHOUT CONTRAST CT CERVICAL SPINE WITHOUT CONTRAST TECHNIQUE: Multidetector CT imaging of the head and cervical spine was performed following the standard protocol without intravenous contrast. Multiplanar CT image reconstructions of the cervical spine were also generated. COMPARISON:  Head CT 11/05/2016.  Neck CT 05/16/2014. FINDINGS: CT HEAD FINDINGS Brain: Mild cerebral atrophy. Patchy and confluent areas of decreased attenuation are noted throughout the deep and periventricular white matter of the cerebral hemispheres bilaterally, compatible with chronic microvascular ischemic disease. No evidence  of acute infarction, hemorrhage, hydrocephalus, extra-axial collection or mass lesion/mass effect. Vascular: No hyperdense vessel or unexpected calcification. Skull: Normal. Negative for fracture or focal lesion. Sinuses/Orbits: No acute finding. Other: Multiple small locules of gas are noted in the deep soft tissues of the face. CT CERVICAL SPINE FINDINGS Alignment: Normal. Skull base and vertebrae: No acute fracture. No primary bone lesion or focal pathologic process. Soft tissues and spinal canal: No prevertebral fluid or swelling. No visible canal hematoma. Disc levels: Moderate multilevel degenerative disc disease, most pronounced at C5-C6. Mild multilevel facet arthropathy. Anterior bridging osteophytes throughout the cervical spine, compatible with underlying diffuse idiopathic skeletal hyperostosis. This is most pronounced at C1-C2 and C3-C4. Upper chest: Again noted is some scarring in the visualized lung apices, most evident in the right upper lobe where there is some thickening of the peribronchovascular interstitium and some peripheral bronchiolectasis, similar to prior study from 2015. Other: Gas throughout the venous system of the neck. No non-venous gas is identified in the soft tissues of the neck. IMPRESSION: 1. No acute intracranial abnormalities. 2. No evidence of acute traumatic injury to the cervical spine. 3. Mild cerebral atrophy with extensive chronic microvascular ischemic changes in the cerebral white matter, as above. 4. Extensive venous gas noted throughout the cervical region and in the deep soft tissues of the face, presumably iatrogenic related to recent IV placement. 5. DISH. Electronically Signed   By: Vinnie Langton M.D.   On: 02/08/2017 14:34   Ct Femur Right W Contrast  Result Date: 02/08/2017 CLINICAL DATA:  Small blisters of the right upper thigh EXAM: CT OF THE LOWER RIGHT EXTREMITY WITH CONTRAST TECHNIQUE: Multidetector CT imaging of the lower right extremity was  performed according to the standard protocol following intravenous contrast administration. COMPARISON:  None. CONTRAST:  26m ISOVUE-300 IOPAMIDOL (ISOVUE-300) INJECTION 61% FINDINGS: Bones/Joint/Cartilage Nonacute. No bone destruction or fracture. The visualized right hip joint is slightly narrowed in appearance with minimal spurring off the femoral head- neck junction and acetabulum. Minimal enthesopathy off the greater trochanter as well. The visualized pubic rami appear intact. The femoral shaft is maintained without acute osseous abnormality. There is patellofemoral joint space narrowing and spurring consistent with osteoarthritis with trace joint effusion. Ligaments Suboptimally assessed by CT. Muscles and Tendons No acute findings.  No intramuscular mass, hemorrhage or atrophy. Soft tissues Minimal subcutaneous fatty induration along the anteromedial right thigh were there are cutaneous nodules, the largest approximately 13 mm consistent with reported blisters. No abscess is identified. There is mild diffuse atherosclerotic calcifications along the course the femoral artery. IMPRESSION: 1. No acute osseous abnormality. 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 No frank bone destruction. 2. Small cutaneous nodules along the anteromedial proximal right thigh consistent with reported history of soft tissue blisters. Only minimal subcutaneous soft tissue fatty induration is seen deep to these lesions. No abscess or drainable fluid collections are identified.  Electronically Signed   By: Ashley Royalty M.D.   On: 02/08/2017 18:32   Dg Hip Unilat W Or Wo Pelvis 2-3 Views Right  Result Date: 02/08/2017 CLINICAL DATA:  Found unconscious outside.  Hip and back pain. EXAM: DG HIP (WITH OR WITHOUT PELVIS) 2-3V RIGHT COMPARISON:  None. FINDINGS: There is no evidence of hip fracture or dislocation. There is no evidence of arthropathy or other focal bone abnormality. IMPRESSION: Negative. Electronically Signed    By: Nelson Chimes M.D.   On: 02/08/2017 14:40    EKG:   Atrial fibrillation 93 bpm  IMPRESSION AND PLAN:   1.  Heat stroke, possible cellulitis, subcutaneous emphysema. Patient also with nausea vomiting. Observe overnight. ER physician ordered vancomycin and Zosyn and I'll change to Ancef. IV fluid hydration. 2. Relative hypotension. Half the dose of Toprol for tomorrow morning. Hold Diovan and hydrochlorothiazide. Gentle IV fluid hydration. 3. History of atrial fibrillation on Eliquis 4. History of dementia. Not the best historian. 5. Type 2 diabetes mellitus. On glipizide. Put on sliding scale. Check a hemoglobin A1c. 6. Weakness. Get physical therapy evaluation   All the records are reviewed and case discussed with ED provider. Management plans discussed with the patient, family and they are in agreement.  CODE STATUS: Full code  TOTAL TIME TAKING CARE OF THIS PATIENT: 50 minutes.    Loletha Grayer M.D on 02/08/2017 at 8:10 PM  Between 7am to 6pm - Pager - [phone]  After 6pm call admission pager [phone]  [facility]  [phone]  CC: Primary care physician; Nicolette Bang, DO

## 2017-02-08 NOTE — ED Notes (Signed)
Pt has small blisters appearing to R upper thigh where pt orignally told Dr. Clearnce Hasten she was hurting. Pt upper thighs. Lower belly and shoulders are red like sun burn. Pt had long pants on.

## 2017-02-08 NOTE — ED Triage Notes (Signed)
Pt arrives via [facility] from home where she was wound unresponsive outside sitting in a chair with shallow respirations. Pt has hx dementia. Per EMS pt was outside since 9am this AM. Pt arrives sweaty and very hot. Pt is talking, stating pain in belly and R leg. Pt CBG >500 per EMS. Pt received 866ml NS. Pt is alert to person per EMS and lives with family. Pt is moving all limbs.

## 2017-02-08 NOTE — ED Provider Notes (Signed)
[facility] Emergency Department Provider Note  ____________________________________________   First MD Initiated Contact with Patient 02/08/17 1353     (approximate)  I have reviewed the triage vital signs and the nursing notes.   HISTORY  Chief Complaint Altered Mental Status   HPI Kristine Boyer is a 76 y.o. female with a history of diabetes as well as dementia who was found down outside of her home this morning unresponsive. It is possible that she could've been out in the heat for several hours. The last time that she was seen normal was 9 AM this morning and she is arriving at the emergency department approximately 1:45 PM. The patient reports pain to her right hip. Does not remember how she got to the ground or if she fell or passed out.  Found to be hypertensive with a glucose of about 500 and route.  Past Medical History:  Diagnosis Date  . Abdominal pain 10/04/2013  . Adenomatous colon polyp   . Allergy   . Altered sensation, foot 04/26/2012  . Alzheimer disease   . Anemia   . Bipolar affective ([place])   . Blood transfusion without reported diagnosis   . BPPV (benign paroxysmal positional vertigo) 05/18/2011  . Cataract   . Chest pain 06/18/2016  . De Quervain's syndrome (tenosynovitis) 02/23/2011   Left wrist. Has worked w/ sports medicine and received steroid injection w/o relief.   . Diabetes mellitus   . Dysphagia 08/28/2013  . Dysuria 03/21/2014  . Flank pain 08/28/2013  . Foot pain, right 11/11/2010  . GERD (gastroesophageal reflux disease)   . Hyperlipidemia   . Hypertension   . Hypokalemia 03/18/2011   Takes potassium pills if develops muscle cramps. Eats lots of bananas. 11/2011   . Hypothyroidism   . Immune deficiency disorder ([place])   . Internal hemorrhoids   . Leg swelling 02/14/2013  . Mass of oropharynx 05/15/2014  . Musculoskeletal pain 08/16/2012  . Scalp pain 01/16/2015  . Sciatica 10/08/2014  . Seizures ([place])   . Stiffness of  neck 05/16/2014    Patient Active Problem List   Diagnosis Date Noted  . Insomnia 12/17/2016  . Nonspecific chest pain   . Atypical atrial flutter ([place])   . Essential hypertension   . Chest pain 12/02/2016  . Incontinence 11/02/2016  . Hearing impairment, Left Ear 11/02/2016  . Dementia with behavioral disturbance 11/02/2016  . Atrial fibrillation ([place]) 06/18/2016  . Angina pectoris, unspecified 08/05/2015  . CN (constipation) 12/17/2014  . Dysphagia 08/28/2013  . Unspecified constipation 07/28/2013  . Forgetfulness 07/18/2013  . Breast nodule 01/03/2013  . Lung mass 08/05/2011  . OSA (obstructive sleep apnea) 06/12/2011  . Bulging disc 06/12/2011  . BPPV (benign paroxysmal positional vertigo) 05/18/2011  . BACK PAIN, LUMBAR 11/28/2009  . OBESITY 09/11/2008  . Allergic rhinitis 01/19/2008  . Hypothyroidism 11/25/2006  . Diabetes mellitus type 2, uncontrolled ([place]) 11/25/2006  . Hyperlipidemia 11/25/2006  . HYPERTENSION, BENIGN SYSTEMIC 11/25/2006  . GASTROESOPHAGEAL REFLUX, NO ESOPHAGITIS 11/25/2006  . OSTEOARTHRITIS, MULTI SITES 11/25/2006    Past Surgical History:  Procedure Laterality Date  . ABDOMINAL HYSTERECTOMY    . TONSILECTOMY, ADENOIDECTOMY, BILATERAL MYRINGOTOMY AND TUBES      Prior to Admission medications   Medication Sig Start Date End Date Taking? Authorizing Provider  atorvastatin (LIPITOR) 40 MG tablet Take 1 tablet (40 mg total) by mouth daily. 06/22/16  Yes Nicolette Bang, DO  docusate sodium (COLACE) 100 MG capsule Take 1 capsule (100  mg total) by mouth 2 (two) times daily. 12/03/16  Yes Sela Hilding, MD  ELIQUIS 5 MG TABS tablet TAKE 1 TABLET (5 MG TOTAL) BY MOUTH 2 TIMES DAILY. 08/24/16  Yes Nicolette Bang, DO  glipiZIDE (GLUCOTROL) 5 MG tablet Take 1 tablet (5 mg total) by mouth daily before breakfast. 07/03/16  Yes Nicolette Bang, DO  hydrochlorothiazide (HYDRODIURIL) 25 MG tablet TAKE 1 TABLET (25 MG TOTAL) BY  MOUTH DAILY. 11/16/16  Yes Nicolette Bang, DO  hydrOXYzine (ATARAX/VISTARIL) 25 MG tablet Take 1 tablet (25 mg total) by mouth 3 (three) times daily as needed. 12/16/16  Yes Nicolette Bang, DO  levothyroxine (SYNTHROID, LEVOTHROID) 50 MCG tablet TAKE 1 TABLET (50 MCG TOTAL) BY MOUTH DAILY. 12/16/16  Yes Nicolette Bang, DO  Melatonin 10 MG TABS Take 10 mg by mouth at bedtime.   Yes [provider]  metoprolol (TOPROL-XL) 200 MG 24 hr tablet TAKE 1 TABLET (200 MG TOTAL) BY MOUTH DAILY. 02/02/17  Yes Nicolette Bang, DO  polyethylene glycol powder (GLYCOLAX/MIRALAX) powder Take 17 g by mouth daily. Patient taking differently: Take 17 g by mouth daily as needed for mild constipation.  12/17/14  Yes Mariel Aloe, MD  senna (SENOKOT) 8.6 MG tablet Take 2 tablets (17.2 mg total) by mouth 2 (two) times daily. Once achieve daily BM decrease to one tablet daily Patient taking differently: Take 2 tablets by mouth daily as needed for constipation.  10/24/13  Yes Waldemar Dickens, MD  traZODone (DESYREL) 50 MG tablet Take 0.5-1 tablets (25-50 mg total) by mouth at bedtime as needed for sleep. 12/16/16  Yes Nicolette Bang, DO  valsartan (DIOVAN) 320 MG tablet TAKE 1 TABLET (320 MG TOTAL) BY MOUTH DAILY. 11/10/16  Yes Nicolette Bang, DO  ACCU-CHEK FASTCLIX LANCETS MISC 1 each by Does not apply route daily before breakfast. 05/23/15   Mariel Aloe, MD  Blood Glucose Monitoring Suppl (ACCU-CHEK AVIVA PLUS) W/DEVICE KIT Please check your blood sugar once per day when you wake up. 05/23/15   Mariel Aloe, MD  glucose blood test strip 1 each by Other route daily before breakfast. 05/23/15   Mariel Aloe, MD    Allergies Lactose intolerance (gi); Vicodin [hydrocodone-acetaminophen]; and Neurontin [gabapentin]  Family History  Problem Relation Age of Onset  . Diabetes Mother   . Stomach cancer Sister   . Colon cancer Unknown         grandmother    Social History Social History  Substance Use Topics  . Smoking status: Former Smoker    Quit date: 10/13/2011  . Smokeless tobacco: Never Used  . Alcohol use No    Review of Systems  Constitutional: No fever/chills Eyes: No visual changes. ENT: No sore throat. Cardiovascular: Denies chest pain. Respiratory: Denies shortness of breath. Gastrointestinal: No abdominal pain.  No nausea, no vomiting.  No diarrhea.  No constipation. Genitourinary: Negative for dysuria. Musculoskeletal: Negative for back pain. Skin: Negative for rash. Neurological: Negative for headaches, focal weakness or numbness.   ____________________________________________   PHYSICAL EXAM:  VITAL SIGNS: ED Triage Vitals  Enc Vitals Group     BP      Pulse      Resp      Temp      Temp src      SpO2      Weight      Height      Head Circumference      Peak  Flow      Pain Score      Pain Loc      Pain Edu?      Excl. in [place]?     Constitutional: Alert and oriented. Well appearing and in no acute distress. Eyes: Conjunctivae are normal. PERRL. EOMI. Head: Atraumatic. Nose: No congestion/rhinnorhea. Mouth/Throat: Mucous membranes are moist.   Neck: No stridor.   Cardiovascular: Normal rate, regular rhythm. Grossly normal heart sounds.  Good peripheral circulation With present and equal dorsalis pedis pulses. Respiratory: Normal respiratory effort.  No retractions. Lungs CTAB. Gastrointestinal: Soft with mild and diffuse abdominal pain which patient says is chronic. No distention.  Musculoskeletal: No lower extremity tenderness nor edema.  No joint effusions. 5 out of 5 strength to bilateral lower extremity is. Neurologic:  Normal speech and language. No gross focal neurologic deficits are appreciated.  Skin:  Skin is warm, dry and intact. No rash noted. Psychiatric: Mood and affect are normal. Speech and behavior are normal.  ____________________________________________    LABS (all labs ordered are listed, but only abnormal results are displayed)  Labs Reviewed  CBC WITH DIFFERENTIAL/PLATELET - Abnormal; Notable for the following:       Result Value   Hemoglobin 11.0 (*)    HCT 33.5 (*)    Platelets 141 (*)    Neutro Abs 9.1 (*)    Lymphs Abs 0.9 (*)    All other components within normal limits  URINALYSIS, COMPLETE (UACMP) WITH MICROSCOPIC - Abnormal; Notable for the following:    Color, Urine YELLOW (*)    APPearance HAZY (*)    Glucose, UA >=500 (*)    Bacteria, UA RARE (*)    Squamous Epithelial / LPF 0-5 (*)    All other components within normal limits  GLUCOSE, CAPILLARY - Abnormal; Notable for the following:    Glucose-Capillary 316 (*)    All other components within normal limits  COMPREHENSIVE METABOLIC PANEL - Abnormal; Notable for the following:    Glucose, Bld 315 (*)    BUN 26 (*)    Creatinine, Ser 1.35 (*)    Calcium 8.8 (*)    GFR calc non Af Amer 37 (*)    GFR calc Af Amer 43 (*)    All other components within normal limits  CK  LIPASE, BLOOD  TROPONIN I   ____________________________________________  EKG  ED ECG REPORT I, Doran Stabler, the attending physician, personally viewed and interpreted this ECG.   Date: 02/08/2017  EKG Time: 1450  Rate: 93  Rhythm: atrial fibrillation, rate 93  Axis: normal  Intervals:none  ST&T Change: No ST segment elevation or depression. T-wave inversions in V5 and V6. Previous EKGs with atrial fibrillation and atrial flutter. Also, the lateral T-wave inversions are seen on previous EKGs. ____________________________________________  RADIOLOGY    CT FEMUR RIGHT W CONTRAST (Final result)  Result time 02/08/17 18:32:03  Procedure changed from [place] BIL  Final result by Massie Kluver, MD (02/08/17 18:32:03)           Narrative:   CLINICAL DATA: Small blisters of the right upper thigh  EXAM: CT OF THE LOWER RIGHT EXTREMITY WITH  CONTRAST  TECHNIQUE: Multidetector CT imaging of the lower right extremity was performed according to the standard protocol following intravenous contrast administration.  COMPARISON: None.  CONTRAST: 3m ISOVUE-300 IOPAMIDOL (ISOVUE-300) INJECTION 61%  FINDINGS: Bones/Joint/Cartilage  Nonacute. No bone destruction or fracture. The visualized right hip joint is slightly narrowed  in appearance with minimal spurring off the femoral head- neck junction and acetabulum. Minimal enthesopathy off the greater trochanter as well. The visualized pubic rami appear intact. The femoral shaft is maintained without acute osseous abnormality. There is patellofemoral joint space narrowing and spurring consistent with osteoarthritis with trace joint effusion.  Ligaments  Suboptimally assessed by CT.  Muscles and Tendons  No acute findings. No intramuscular mass, hemorrhage or atrophy.  Soft tissues  Minimal subcutaneous fatty induration along the anteromedial right thigh were there are cutaneous nodules, the largest approximately 13 mm consistent with reported blisters. No abscess is identified. There is mild diffuse atherosclerotic calcifications along the course the femoral artery.  IMPRESSION: 1. No acute osseous abnormality. 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 No frank bone destruction. 2. Small cutaneous nodules along the anteromedial proximal right thigh consistent with reported history of soft tissue blisters. Only minimal subcutaneous soft tissue fatty induration is seen deep to these lesions. No abscess or drainable fluid collections are identified.   Electronically Signed By: Ashley Royalty M.D. On: 02/08/2017 18:32            DG Chest 2 View (Final result)  Result time 02/08/17 14:39:56  Final result by Aletta Edouard, MD (02/08/17 14:39:56)           Narrative:   CLINICAL DATA: Found unresponsive.  EXAM: CHEST 2  VIEW  COMPARISON: 12/02/2016  FINDINGS: The heart size and mediastinal contours are within normal limits. Low bilateral lung volumes with bibasilar atelectasis. There is no evidence of pulmonary edema, consolidation, pneumothorax, nodule or pleural fluid. Stable degenerative disease of the thoracic spine.  IMPRESSION: No active cardiopulmonary disease.   Electronically Signed By: Aletta Edouard M.D. On: 02/08/2017 14:39            DG Hip Unilat W or Wo Pelvis 2-3 Views Right (Final result)  Result time 02/08/17 14:40:43  Final result by Nelson Chimes, MD (02/08/17 14:40:43)           Narrative:   CLINICAL DATA: Found unconscious outside. Hip and back pain.  EXAM: DG HIP (WITH OR WITHOUT PELVIS) 2-3V RIGHT  COMPARISON: None.  FINDINGS: There is no evidence of hip fracture or dislocation. There is no evidence of arthropathy or other focal bone abnormality.  IMPRESSION: Negative.   Electronically Signed By: Nelson Chimes M.D. On: 02/08/2017 14:40            CT Head Wo Contrast (Final result)  Result time 02/08/17 14:34:05  Final result by Etheleen Mayhew, MD (02/08/17 14:34:05)           Narrative:   CLINICAL DATA: 76 year old female found down on the floor today.  EXAM: CT HEAD WITHOUT CONTRAST  CT CERVICAL SPINE WITHOUT CONTRAST  TECHNIQUE: Multidetector CT imaging of the head and cervical spine was performed following the standard protocol without intravenous contrast. Multiplanar CT image reconstructions of the cervical spine were also generated.  COMPARISON: Head CT 11/05/2016. Neck CT 05/16/2014.  FINDINGS: CT HEAD FINDINGS  Brain: Mild cerebral atrophy. Patchy and confluent areas of decreased attenuation are noted throughout the deep and periventricular white matter of the cerebral hemispheres bilaterally, compatible with chronic microvascular ischemic disease. No evidence of acute infarction, hemorrhage,  hydrocephalus, extra-axial collection or mass lesion/mass effect.  Vascular: No hyperdense vessel or unexpected calcification.  Skull: Normal. Negative for fracture or focal lesion.  Sinuses/Orbits: No acute finding.  Other: Multiple small locules of gas are noted in the deep soft tissues of the  face.  CT CERVICAL SPINE FINDINGS  Alignment: Normal.  Skull base and vertebrae: No acute fracture. No primary bone lesion or focal pathologic process.  Soft tissues and spinal canal: No prevertebral fluid or swelling. No visible canal hematoma.  Disc levels: Moderate multilevel degenerative disc disease, most pronounced at C5-C6. Mild multilevel facet arthropathy. Anterior bridging osteophytes throughout the cervical spine, compatible with underlying diffuse idiopathic skeletal hyperostosis. This is most pronounced at C1-C2 and C3-C4.  Upper chest: Again noted is some scarring in the visualized lung apices, most evident in the right upper lobe where there is some thickening of the peribronchovascular interstitium and some peripheral bronchiolectasis, similar to prior study from 2015.  Other: Gas throughout the venous system of the neck. No non-venous gas is identified in the soft tissues of the neck.  IMPRESSION: 1. No acute intracranial abnormalities. 2. No evidence of acute traumatic injury to the cervical spine. 3. Mild cerebral atrophy with extensive chronic microvascular ischemic changes in the cerebral white matter, as above. 4. Extensive venous gas noted throughout the cervical region and in the deep soft tissues of the face, presumably iatrogenic related to recent IV placement. 5. DISH.   Electronically Signed By: Vinnie Langton M.D. On: 02/08/2017 14:34            CT Cervical Spine Wo Contrast (Final result)  Result time 02/08/17 14:34:05  Final result by Etheleen Mayhew, MD (02/08/17 14:34:05)           Narrative:   CLINICAL DATA:  76 year old female found down on the floor today.  EXAM: CT HEAD WITHOUT CONTRAST  CT CERVICAL SPINE WITHOUT CONTRAST  TECHNIQUE: Multidetector CT imaging of the head and cervical spine was performed following the standard protocol without intravenous contrast. Multiplanar CT image reconstructions of the cervical spine were also generated.  COMPARISON: Head CT 11/05/2016. Neck CT 05/16/2014.  FINDINGS: CT HEAD FINDINGS  Brain: Mild cerebral atrophy. Patchy and confluent areas of decreased attenuation are noted throughout the deep and periventricular white matter of the cerebral hemispheres bilaterally, compatible with chronic microvascular ischemic disease. No evidence of acute infarction, hemorrhage, hydrocephalus, extra-axial collection or mass lesion/mass effect.  Vascular: No hyperdense vessel or unexpected calcification.  Skull: Normal. Negative for fracture or focal lesion.  Sinuses/Orbits: No acute finding.  Other: Multiple small locules of gas are noted in the deep soft tissues of the face.  CT CERVICAL SPINE FINDINGS  Alignment: Normal.  Skull base and vertebrae: No acute fracture. No primary bone lesion or focal pathologic process.  Soft tissues and spinal canal: No prevertebral fluid or swelling. No visible canal hematoma.  Disc levels: Moderate multilevel degenerative disc disease, most pronounced at C5-C6. Mild multilevel facet arthropathy. Anterior bridging osteophytes throughout the cervical spine, compatible with underlying diffuse idiopathic skeletal hyperostosis. This is most pronounced at C1-C2 and C3-C4.  Upper chest: Again noted is some scarring in the visualized lung apices, most evident in the right upper lobe where there is some thickening of the peribronchovascular interstitium and some peripheral bronchiolectasis, similar to prior study from 2015.  Other: Gas throughout the venous system of the neck. No non-venous gas is identified  in the soft tissues of the neck.  IMPRESSION: 1. No acute intracranial abnormalities. 2. No evidence of acute traumatic injury to the cervical spine. 3. Mild cerebral atrophy with extensive chronic microvascular ischemic changes in the cerebral white matter, as above. 4. Extensive venous gas noted throughout the cervical region and in the deep soft tissues of the  face, presumably iatrogenic related to recent IV placement. 5. DISH.   Electronically Signed By: Vinnie Langton M.D. On: 02/08/2017 14:34            ____________________________________________   PROCEDURES  Procedure(s) performed:   Procedures  Critical Care performed:   ____________________________________________   INITIAL IMPRESSION / ASSESSMENT AND PLAN / ED COURSE  Pertinent labs & imaging results that were available during my care of the patient were reviewed by me and considered in my medical decision making (see chart for details). ----------------------------------------- 7:11 PM on 02/08/2017 -----------------------------------------  Patient has developed a rash about the course of her stay. The rash is to the right thigh anteriorly which is where it started. It is mottled, blanching and erythematous. It is tender to palpation and there are several, one to 2 centimeter bulla on the anterior surface of the right eye. However, the rash has radiated to the left anterior thigh and onto the inferior abdomen. The patient also has some erythema to the right side of the neck as well as to the supraclavicular region.  Dr. Burt Knack the surgical services evaluated the patient the bedside and does not believe the patient has a necrotizing infection. However, the constellation of symptoms is very odd given that the patient has air found into the deep tissues of the neck. The patient will be started on broad-spectrum antibiotics and admitted to the hospital for further observation. Explained the Plan the  patient as well as the family at the bedside. They're understanding when to comply. Signed out to Dr. Darvin Neighbours.      ____________________________________________   FINAL CLINICAL IMPRESSION(S) / ED DIAGNOSES  Final diagnoses:  Pain  Rash      NEW MEDICATIONS STARTED DURING THIS VISIT:  New Prescriptions   No medications on file     Note:  This document was prepared using Dragon voice recognition software and may include unintentional dictation errors.    Orbie Pyo, MD 02/08/17 [phone]

## 2017-02-08 NOTE — Progress Notes (Signed)
ANTIBIOTIC CONSULT NOTE - INITIAL  Pharmacy Consult for Cefazolin Indication: cellulitis   Allergies  Allergen Reactions  . Lactose Intolerance (Gi) Other (See Comments)    constipation  . Vicodin [Hydrocodone-Acetaminophen] Itching  . Neurontin [Gabapentin] Other (See Comments)    Drowsy. Makes her feel "funny"    Patient Measurements:   Adjusted Body Weight:   Vital Signs: Temp: 98.3 F (36.8 C) (05/14 1444) Temp Source: Oral (05/14 1444) BP: 102/86 (05/14 1800) Pulse Rate: 89 (05/14 1800) Intake/Output from previous day: No intake/output data recorded. Intake/Output from this shift: No intake/output data recorded.  Labs:  Recent Labs  02/08/17 1355 02/08/17 1603  WBC 11.0  --   HGB 11.0*  --   PLT 141*  --   CREATININE  --  1.35*   Estimated Creatinine Clearance: 36.7 mL/min (A) (by C-G formula based on SCr of 1.35 mg/dL (H)). No results for input(s): VANCOTROUGH, VANCOPEAK, VANCORANDOM, GENTTROUGH, GENTPEAK, GENTRANDOM, TOBRATROUGH, TOBRAPEAK, TOBRARND, AMIKACINPEAK, AMIKACINTROU, AMIKACIN in the last 72 hours.   Microbiology: No results found for this or any previous visit (from the past 720 hour(s)).  Medical History: Past Medical History:  Diagnosis Date  . Abdominal pain 10/04/2013  . Adenomatous colon polyp   . Allergy   . Altered sensation, foot 04/26/2012  . Alzheimer disease   . Anemia   . Bipolar affective ([place])   . Blood transfusion without reported diagnosis   . BPPV (benign paroxysmal positional vertigo) 05/18/2011  . Cataract   . Chest pain 06/18/2016  . De Quervain's syndrome (tenosynovitis) 02/23/2011   Left wrist. Has worked w/ sports medicine and received steroid injection w/o relief.   . Diabetes mellitus   . Dysphagia 08/28/2013  . Dysuria 03/21/2014  . Flank pain 08/28/2013  . Foot pain, right 11/11/2010  . GERD (gastroesophageal reflux disease)   . Hyperlipidemia   . Hypertension   . Hypokalemia 03/18/2011   Takes potassium pills if  develops muscle cramps. Eats lots of bananas. 11/2011   . Hypothyroidism   . Immune deficiency disorder ([place])   . Internal hemorrhoids   . Leg swelling 02/14/2013  . Mass of oropharynx 05/15/2014  . Musculoskeletal pain 08/16/2012  . Scalp pain 01/16/2015  . Sciatica 10/08/2014  . Seizures ([place])   . Stiffness of neck 05/16/2014    } Assessment: Pharmacy consulted to dose and monitor Cefazolin.  Goal of Therapy:    Plan:  Will start Cefazolin 1 g IV  q8 hours.   Jaasia Viglione D 02/08/2017,8:18 PM

## 2017-02-08 NOTE — Progress Notes (Signed)
SURGICAL PROGRESS/FOLLOW-UP NOTE  76 year old diabetic Female seen and examined for follow-up with no further imaging or laboratory studies performed or ordered to be done tonight.   Patient currently reports only minimal pain directly over her Right antero-medial thigh bulla and no pain over her Right neck or abdomen. She and her son at bedside report she went outside of her house this morning in her usual state of health and fell asleep or passed out in the hot sun. Her son states she frequently does similar, but she typically sits outside in the shade. When she was found outside an unknown number of hours (>2 hours) later sitting upright and unresponsive, she was confused, had reportedly vomited, and was immediately brought to [facility] ED for further evaluation. Patient currently denies any nausea, fever/chills, CP, or SOB.  BP (!) 154/88   Pulse 64   Temp 98.3 F (36.8 C) (Oral)   Resp 14   SpO2 99%   General: Patient is currently AAOx3 Integumentary: Mildly tender 3 cm x 1 cm Right anteromedial thigh bulla with NT erythema surrounding the Right thigh bulla and similarly on her Left thigh, Right neck, and RLQ abdomen, no crepitus, fluctuance, or induration appreciated CV: RRR, S1S2 heard Resp: Breathing non-labored at rest GI: Abdomen soft, overweight, NT, ND Musculoskeletal: FROM with motor and sensation intact and symmetric  Imaging studies personally reviewed with CT Right femur and Head/Neck significant for Right anteromedial thigh cutaneous nodule consistent with location of blister on exam and extensive Right cervical venous as well as in deep soft tissues of face  ED physician Dr. Clearnce Hasten states neither he nor the admitting hospitalist would like to request a surgical consultation at this time.  Please call if any further surgical evaluation is requested. Thank you for the opportunity to participate in this patient's care.  -- Marilynne Drivers Rosana Hoes, MD, [place]: [facility] - Partnering for exceptional care. Office: [phone]

## 2017-02-08 NOTE — ED Notes (Signed)
Dr. Schaevitz at bedside.  

## 2017-02-09 ENCOUNTER — Observation Stay: Payer: Medicare HMO

## 2017-02-09 DIAGNOSIS — E876 Hypokalemia: Secondary | ICD-10-CM | POA: Diagnosis not present

## 2017-02-09 DIAGNOSIS — T790XXA Air embolism (traumatic), initial encounter: Secondary | ICD-10-CM

## 2017-02-09 DIAGNOSIS — R112 Nausea with vomiting, unspecified: Secondary | ICD-10-CM | POA: Diagnosis not present

## 2017-02-09 DIAGNOSIS — R4189 Other symptoms and signs involving cognitive functions and awareness: Secondary | ICD-10-CM | POA: Diagnosis not present

## 2017-02-09 DIAGNOSIS — R29898 Other symptoms and signs involving the musculoskeletal system: Secondary | ICD-10-CM | POA: Diagnosis not present

## 2017-02-09 DIAGNOSIS — R8281 Pyuria: Secondary | ICD-10-CM

## 2017-02-09 DIAGNOSIS — N289 Disorder of kidney and ureter, unspecified: Secondary | ICD-10-CM | POA: Diagnosis not present

## 2017-02-09 DIAGNOSIS — N189 Chronic kidney disease, unspecified: Secondary | ICD-10-CM | POA: Diagnosis not present

## 2017-02-09 LAB — BASIC METABOLIC PANEL
ANION GAP: 8 (ref 5–15)
Anion gap: 6 (ref 5–15)
BUN: 20 mg/dL (ref 6–20)
BUN: 17 mg/dL (ref 6–20)
CALCIUM: 8.5 mg/dL — AB (ref 8.9–10.3)
CALCIUM: 8.5 mg/dL — AB (ref 8.9–10.3)
CO2: 30 mmol/L (ref 22–32)
CO2: 29 mmol/L (ref 22–32)
CREATININE: 1.03 mg/dL — AB (ref 0.44–1.00)
Chloride: 105 mmol/L (ref 101–111)
Chloride: 101 mmol/L (ref 101–111)
Creatinine, Ser: 1.07 mg/dL — ABNORMAL HIGH (ref 0.44–1.00)
GFR calc Af Amer: >60 mL/min (ref >60)
GFR calc Af Amer: 57 mL/min — ABNORMAL LOW (ref >60)
GFR, EST NON AFRICAN AMERICAN: 52 mL/min — AB (ref >60)
GFR, EST NON AFRICAN AMERICAN: 49 mL/min — AB (ref >60)
GLUCOSE: 122 mg/dL — AB (ref 65–99)
Glucose, Bld: 144 mg/dL — ABNORMAL HIGH (ref 65–99)
POTASSIUM: 2.6 mmol/L — AB (ref 3.5–5.1)
Potassium: 3 mmol/L — ABNORMAL LOW (ref 3.5–5.1)
SODIUM: 141 mmol/L (ref 135–145)
Sodium: 138 mmol/L (ref 135–145)

## 2017-02-09 LAB — CBC
HEMATOCRIT: 30.1 % — AB (ref 35.0–47.0)
Hemoglobin: 10 g/dL — ABNORMAL LOW (ref 12.0–16.0)
MCH: 26.9 pg (ref 26.0–34.0)
MCHC: 33.2 g/dL (ref 32.0–36.0)
MCV: 81 fL (ref 80.0–100.0)
Platelets: 126 10*3/uL — ABNORMAL LOW (ref 150–440)
RBC: 3.72 MIL/uL — ABNORMAL LOW (ref 3.80–5.20)
RDW: 14 % (ref 11.5–14.5)
WBC: 9.8 10*3/uL (ref 3.6–11.0)

## 2017-02-09 LAB — GLUCOSE, CAPILLARY
GLUCOSE-CAPILLARY: 75 mg/dL (ref 65–99)
GLUCOSE-CAPILLARY: 105 mg/dL — AB (ref 65–99)
GLUCOSE-CAPILLARY: 184 mg/dL — AB (ref 65–99)
Glucose-Capillary: 152 mg/dL — ABNORMAL HIGH (ref 65–99)

## 2017-02-09 LAB — MAGNESIUM: Magnesium: 1.8 mg/dL (ref 1.7–2.4)

## 2017-02-09 LAB — TROPONIN I

## 2017-02-09 LAB — SEDIMENTATION RATE: SED RATE: 49 mm/h — AB (ref 0–30)

## 2017-02-09 MED ORDER — CEPHALEXIN 500 MG PO CAPS: 500.0000 mg | ORAL_CAPSULE | Freq: Two times a day (BID) | ORAL | 0 refills | Status: DC

## 2017-02-09 MED ORDER — IRBESARTAN 150 MG PO TABS
300.0000 mg | ORAL_TABLET | Freq: Every day | ORAL | Status: DC
  Administered 2017-02-09: 300 mg via ORAL
  Filled 2017-02-09: qty 2

## 2017-02-09 MED ORDER — GUAIFENESIN-DM 100-10 MG/5ML PO SYRP
5.0000 mL | ORAL_SOLUTION | ORAL | Status: DC | PRN
  Administered 2017-02-09 (×2): 5 mL via ORAL
  Filled 2017-02-09 (×2): qty 5

## 2017-02-09 MED ORDER — POTASSIUM CHLORIDE IN NACL 20-0.9 MEQ/L-% IV SOLN
INTRAVENOUS | Status: DC
  Administered 2017-02-09 – 2017-02-10 (×2): via INTRAVENOUS
  Filled 2017-02-09 (×3): qty 1000

## 2017-02-09 MED ORDER — DEXTROSE 5 % IV SOLN
40.0000 meq | Freq: Once | INTRAVENOUS | Status: AC
Start: 2017-02-09 — End: 2017-02-10
  Administered 2017-02-09: 40 meq via INTRAVENOUS
  Filled 2017-02-09: qty 9.09

## 2017-02-09 MED ORDER — GUAIFENESIN-DM 100-10 MG/5ML PO SYRP: 5.0000 mL | ORAL_SOLUTION | ORAL | 0 refills | Status: AC | PRN

## 2017-02-09 NOTE — Evaluation (Signed)
Physical Therapy Evaluation Patient Details Name: Kristine Boyer MRN: [id number] DOB: Dec 10, 1940 Today's Date: 02/09/2017   History of Present Illness  Pt admitted for subcutaneous emphysema. Pt with complaints of AMS and found outside of home with vomit on shirt. Pt also with noted blister on R thigh. Pt currently alert and oriented at this time. HIstory includes alzheimer's dx, bipolar, BPPV, and seizures.  Clinical Impression  Pt is a pleasant 76 year old female who was admitted for subcutaneous emphysema. Pt performs bed mobility with independence, transfers with cga, and ambulation with cga and RW. Pt demonstrates deficits with strength/endurance/dizziness/mobility. Secondary to dizziness, recommend use of RW for all mobility. Pt reports R thigh blister itching, all skin intact at this time. Positioned legs to alleviate rubbing on thigh. Would benefit from skilled PT to address above deficits and promote optimal return to PLOF. Pt is getting close to baseline level. Recommend transition to HHPT upon discharge from acute hospitalization.       Follow Up Recommendations Home health PT    Equipment Recommendations  Rolling walker with 5" wheels (if pt doesn't have one)    Recommendations for Other Services       Precautions / Restrictions Precautions Precautions: Fall Restrictions Weight Bearing Restrictions: No      Mobility  Bed Mobility Overal bed mobility: Independent             General bed mobility comments: safe technique performed with safe upright posture  Transfers Overall transfer level: Needs assistance Equipment used: Rolling walker (2 wheeled) Transfers: Sit to/from Stand Sit to Stand: Min guard         General transfer comment: Safe technique not needing AD, however then holds onto RW once standing as she reports dizziness, relates it to her chronic vertigo.  Ambulation/Gait Ambulation/Gait assistance: Min guard Ambulation Distance (Feet): 40  Feet Assistive device: Rolling walker (2 wheeled) Gait Pattern/deviations: Step-through pattern     General Gait Details: ambulated around room, however further distance limited secondary to dizziness. Request to sit in recliner. RW used for mobility efforts with safe technique. Slight unsteadiness, however no formal LOB noted  Stairs            Wheelchair Mobility    Modified Rankin (Stroke Patients Only)       Balance Overall balance assessment: Needs assistance Sitting-balance support: Feet supported Sitting balance-Leahy Scale: Normal     Standing balance support: Bilateral upper extremity supported Standing balance-Leahy Scale: Good                               Pertinent Vitals/Pain Pain Assessment: No/denies pain    Home Living Family/patient expects to be discharged to:: Private residence Living Arrangements: Children (alternates living with son/daughter) Available Help at Discharge: Family (children work, there are times she is alone at home) Type of Home: House Home Access: Stairs to enter Entrance Stairs-Rails: Right Entrance Stairs-Number of Steps: 4 Home Layout: One level Home Equipment: Cane - single point (reports she may have RW, but not sure if it is still there)      Prior Function Level of Independence: Independent         Comments: reports she hasn't been using RW as much as she should     Hand Dominance        Extremity/Trunk Assessment   Upper Extremity Assessment Upper Extremity Assessment: Overall WFL for tasks assessed    Lower Extremity Assessment  Lower Extremity Assessment: Generalized weakness (B LE grossly 4/5)       Communication   Communication: No difficulties  Cognition Arousal/Alertness: Awake/alert Behavior During Therapy: WFL for tasks assessed/performed Overall Cognitive Status: Within Functional Limits for tasks assessed                                        General  Comments      Exercises Other Exercises Other Exercises: Supine ther-ex performed on B LE including ankle pumps, SLRs, hip abd/add, and heel slides. All ther-ex performed x 10 reps with special attention to be aware of blisters. Supervision provided for completion.   Assessment/Plan    PT Assessment Patient needs continued PT services  PT Problem List Decreased strength;Decreased activity tolerance;Decreased mobility       PT Treatment Interventions Gait training;DME instruction;Therapeutic exercise;Balance training    PT Goals (Current goals can be found in the Care Plan section)  Acute Rehab PT Goals Patient Stated Goal: to go home PT Goal Formulation: With patient Time For Goal Achievement: 02/23/17 Potential to Achieve Goals: Good    Frequency Min 2X/week   Barriers to discharge        Co-evaluation               AM-PAC PT "6 Clicks" Daily Activity  Outcome Measure Difficulty turning over in bed (including adjusting bedclothes, sheets and blankets)?: None Difficulty moving from lying on back to sitting on the side of the bed? : None Difficulty sitting down on and standing up from a chair with arms (e.g., wheelchair, bedside commode, etc,.)?: None Help needed moving to and from a bed to chair (including a wheelchair)?: A Little Help needed walking in hospital room?: A Little Help needed climbing 3-5 steps with a railing? : A Little 6 Click Score: 21    End of Session Equipment Utilized During Treatment: Gait belt Activity Tolerance: Patient tolerated treatment well Patient left: in chair;with chair alarm set Nurse Communication: Mobility status PT Visit Diagnosis: Muscle weakness (generalized) (M62.81);Dizziness and giddiness (R42)    Time: 9147-8295 PT Time Calculation (min) (ACUTE ONLY): 22 min   Charges:   PT Evaluation $PT Eval Low Complexity: 1 Procedure PT Treatments $Therapeutic Exercise: 8-22 mins   PT G Codes:   PT G-Codes **NOT FOR INPATIENT  CLASS** Functional Assessment Tool Used: AM-PAC 6 Clicks Basic Mobility Functional Limitation: Mobility: Walking and moving around Mobility: Walking and Moving Around Current Status (A2130): At least 20 percent but less than 40 percent impaired, limited or restricted Mobility: Walking and Moving Around Goal Status [phone]): At least 1 percent but less than 20 percent impaired, limited or restricted    Elizabeth Palau, PT, DPT [phone]   Tedric Leeth 02/09/2017, 1:03 PM

## 2017-02-09 NOTE — Progress Notes (Signed)
Chaplain received an order to visit with pt in room 216. Chaplain provided information for an Advanced Directive.    02/09/17 0930  Clinical Encounter Type  Visited With Patient;Patient and family together  Visit Type Initial  Referral From Nurse  Consult/Referral To Chaplain  Spiritual Encounters  Spiritual Needs Other (Comment)

## 2017-02-09 NOTE — Care Management Obs Status (Signed)
[place] NOTIFICATION   Patient Details  Name: Kristine Boyer MRN: [id number] Date of Birth: January 22, 1941   Medicare Observation Status Notification Given:  Yes    Beverly Sessions, RN 02/09/2017, 4:21 PM

## 2017-02-09 NOTE — Care Management (Signed)
Patient admitted for Unresponsive episode.  Patient lives in her own home.  Her son and daughter take turns staying with her every 2 weeks. PCP Juleen China.  Pharmacy [facility].  Patient ambulates with a walker in the home.  Son states that at baseline patient is independent of ADL's.  PT has assessed patient and recommended home health PT.  Patient and son have declined home health services at discharge.  RNCM following for discharge needs.

## 2017-02-09 NOTE — Progress Notes (Signed)
[place] [facility] [place] NAME: Kristine Boyer    MR#:  [id number]  DATE OF BIRTH:  27-May-1941  SUBJECTIVE:  CHIEF COMPLAINT:   Chief Complaint  Patient presents with  . Altered Mental Status  Patient is 76 year old African female with past medical history significant for history of hypertension, hyperlipidemia, Alzheimer disease,, who was brought to the hospital with unresponsiveness and vomiting. Apparently patient was found outside sleeping with some vomitus on her shirt. On arrival to the office, which she was noted to have a little blister and the right anterior thigh. Hospitalist services were contacted and patient was admitted. Radiologic studies revealed venous air embolism throughout the cervical region and in the deep tissues of the face. Labs showed pyuria, concerning for urinary tract infection, mild renal insufficiency. Urine cultures were taken. Patient was admitted to the hospital for further evaluation and treatment. She was initiated on antibiotic therapy, IV fluids and her condition improved. She had carotid ultrasound, which was normal. I discussed the case of venous gas in cervical region and deep tissues of the face with vascular surgeon, Dr. Lucky Cowboy, who recommended no intervention, but observation. Right upper anterior thigh blister was felt to be allergic reaction due to sun exposure or bug bite, no evidence of cellulitis was noted, however. In regards to pyuria, as mentioned above. Urine cultures were taken and patient was initiated on antibiotic therapy. Patient is to continue antibiotics to complete course. Orthostatic vital signs were normal. Echocardiogram was not performed, since patient had echocardiogram in September 2017, which was normal. Patient had no other symptoms. EKG on admission to emergency room revealed atrial fibrillation, rate in 90s, patient remained in atrial fibrillation while in the hospital, however, no long  pauses were documented. The patient was about to be discharged to home, however, repeated labs revealed potassium level of 2.6. Discharge was held  Review of Systems  Unable to perform ROS: Dementia  Constitutional: Negative for chills, fever and weight loss.  HENT: Negative for congestion.   Eyes: Negative for blurred vision and double vision.  Respiratory: Negative for cough, sputum production, shortness of breath and wheezing.   Cardiovascular: Negative for chest pain, palpitations, orthopnea, leg swelling and PND.  Gastrointestinal: Negative for abdominal pain, blood in stool, constipation, diarrhea, nausea and vomiting.  Genitourinary: Negative for dysuria, frequency, hematuria and urgency.  Musculoskeletal: Negative for falls.  Neurological: Negative for dizziness, tremors, focal weakness and headaches.  Endo/Heme/Allergies: Does not bruise/bleed easily.  Psychiatric/Behavioral: Negative for depression. The patient does not have insomnia.     VITAL SIGNS: Blood pressure (!) 144/91, pulse 81, temperature 98.2 F (36.8 C), temperature source Oral, resp. rate 14, height 5\' 3"  (1.6 m), weight 82.8 kg (182 lb 8.7 oz), SpO2 100 %.  PHYSICAL EXAMINATION:   GENERAL:  76 y.o.-year-old patient lying in the bed with no acute distress.  EYES: Pupils equal, round, reactive to light and accommodation. No scleral icterus. Extraocular muscles intact.  HEENT: Head atraumatic, normocephalic. Oropharynx and nasopharynx clear.  NECK:  Supple, no jugular venous distention. No thyroid enlargement, no tenderness.  LUNGS: Normal breath sounds bilaterally, no wheezing, rales,rhonchi or crepitation. No use of accessory muscles of respiration.  CARDIOVASCULAR: S1, S2 normal. No murmurs, rubs, or gallops.  ABDOMEN: Soft, nontender, nondistended. Bowel sounds present. No organomegaly or mass.  EXTREMITIES: No pedal edema, cyanosis, or clubbing.  NEUROLOGIC: Cranial nerves II through XII are intact. Muscle  strength 5/5 in all extremities. Sensation intact.  Gait not checked.  PSYCHIATRIC: The patient is alert and oriented x 2.  SKIN: No obvious rash, lesion, or ulcer. Few Right upper thigh Bull[place] and evanescent erythema concerning for hives  ORDERS/RESULTS REVIEWED:   CBC  Recent Labs Lab 02/08/17 1355 02/09/17 0430  WBC 11.0 9.8  HGB 11.0* 10.0*  HCT 33.5* 30.1*  PLT 141* 126*  MCV 82.5 81.0  MCH 27.1 26.9  MCHC 32.8 33.2  RDW 13.8 14.0  LYMPHSABS 0.9*  --   MONOABS 0.9  --   EOSABS 0.1  --   BASOSABS 0.0  --    ------------------------------------------------------------------------------------------------------------------  Chemistries   Recent Labs Lab 02/08/17 1603 02/09/17 0430 02/09/17 1550  NA 139 141 138  K 3.5 3.0* 2.6*  CL 104 105 101  CO2 29 30 29   GLUCOSE 315* 144* 122*  BUN 26* 20 17  CREATININE 1.35* 1.03* 1.07*  CALCIUM 8.8* 8.5* 8.5*  AST 31  --   --   ALT 20  --   --   ALKPHOS 86  --   --   BILITOT 0.6  --   --    ------------------------------------------------------------------------------------------------------------------ estimated creatinine clearance is 46.3 mL/min (A) (by C-G formula based on SCr of 1.07 mg/dL (H)). ------------------------------------------------------------------------------------------------------------------ No results for input(s): TSH, T4TOTAL, T3FREE, THYROIDAB in the last 72 hours.  Invalid input(s): FREET3  Cardiac Enzymes  Recent Labs Lab 02/08/17 1603 02/09/17 0430  TROPONINI <0.03 <0.03   ------------------------------------------------------------------------------------------------------------------ Invalid input(s): POCBNP ---------------------------------------------------------------------------------------------------------------  RADIOLOGY: Dg Chest 2 View  Result Date: 02/08/2017 CLINICAL DATA:  Found unresponsive. EXAM: CHEST  2 VIEW COMPARISON:  12/02/2016 FINDINGS: The heart size and  mediastinal contours are within normal limits. Low bilateral lung volumes with bibasilar atelectasis. There is no evidence of pulmonary edema, consolidation, pneumothorax, nodule or pleural fluid. Stable degenerative disease of the thoracic spine. IMPRESSION: No active cardiopulmonary disease. Electronically Signed   By: Aletta Edouard M.D.   On: 02/08/2017 14:39   Ct Head Wo Contrast  Result Date: 02/08/2017 CLINICAL DATA:  76 year old female found down on the floor today. EXAM: CT HEAD WITHOUT CONTRAST CT CERVICAL SPINE WITHOUT CONTRAST TECHNIQUE: Multidetector CT imaging of the head and cervical spine was performed following the standard protocol without intravenous contrast. Multiplanar CT image reconstructions of the cervical spine were also generated. COMPARISON:  Head CT 11/05/2016.  Neck CT 05/16/2014. FINDINGS: CT HEAD FINDINGS Brain: Mild cerebral atrophy. Patchy and confluent areas of decreased attenuation are noted throughout the deep and periventricular white matter of the cerebral hemispheres bilaterally, compatible with chronic microvascular ischemic disease. No evidence of acute infarction, hemorrhage, hydrocephalus, extra-axial collection or mass lesion/mass effect. Vascular: No hyperdense vessel or unexpected calcification. Skull: Normal. Negative for fracture or focal lesion. Sinuses/Orbits: No acute finding. Other: Multiple small locules of gas are noted in the deep soft tissues of the face. CT CERVICAL SPINE FINDINGS Alignment: Normal. Skull base and vertebrae: No acute fracture. No primary bone lesion or focal pathologic process. Soft tissues and spinal canal: No prevertebral fluid or swelling. No visible canal hematoma. Disc levels: Moderate multilevel degenerative disc disease, most pronounced at C5-C6. Mild multilevel facet arthropathy. Anterior bridging osteophytes throughout the cervical spine, compatible with underlying diffuse idiopathic skeletal hyperostosis. This is most  pronounced at C1-C2 and C3-C4. Upper chest: Again noted is some scarring in the visualized lung apices, most evident in the right upper lobe where there is some thickening of the peribronchovascular interstitium and some peripheral bronchiolectasis, similar to prior study from 2015. Other:  Gas throughout the venous system of the neck. No non-venous gas is identified in the soft tissues of the neck. IMPRESSION: 1. No acute intracranial abnormalities. 2. No evidence of acute traumatic injury to the cervical spine. 3. Mild cerebral atrophy with extensive chronic microvascular ischemic changes in the cerebral white matter, as above. 4. Extensive venous gas noted throughout the cervical region and in the deep soft tissues of the face, presumably iatrogenic related to recent IV placement. 5. DISH. Electronically Signed   By: Vinnie Langton M.D.   On: 02/08/2017 14:34   Ct Cervical Spine Wo Contrast  Result Date: 02/08/2017 CLINICAL DATA:  76 year old female found down on the floor today. EXAM: CT HEAD WITHOUT CONTRAST CT CERVICAL SPINE WITHOUT CONTRAST TECHNIQUE: Multidetector CT imaging of the head and cervical spine was performed following the standard protocol without intravenous contrast. Multiplanar CT image reconstructions of the cervical spine were also generated. COMPARISON:  Head CT 11/05/2016.  Neck CT 05/16/2014. FINDINGS: CT HEAD FINDINGS Brain: Mild cerebral atrophy. Patchy and confluent areas of decreased attenuation are noted throughout the deep and periventricular white matter of the cerebral hemispheres bilaterally, compatible with chronic microvascular ischemic disease. No evidence of acute infarction, hemorrhage, hydrocephalus, extra-axial collection or mass lesion/mass effect. Vascular: No hyperdense vessel or unexpected calcification. Skull: Normal. Negative for fracture or focal lesion. Sinuses/Orbits: No acute finding. Other: Multiple small locules of gas are noted in the deep soft tissues of  the face. CT CERVICAL SPINE FINDINGS Alignment: Normal. Skull base and vertebrae: No acute fracture. No primary bone lesion or focal pathologic process. Soft tissues and spinal canal: No prevertebral fluid or swelling. No visible canal hematoma. Disc levels: Moderate multilevel degenerative disc disease, most pronounced at C5-C6. Mild multilevel facet arthropathy. Anterior bridging osteophytes throughout the cervical spine, compatible with underlying diffuse idiopathic skeletal hyperostosis. This is most pronounced at C1-C2 and C3-C4. Upper chest: Again noted is some scarring in the visualized lung apices, most evident in the right upper lobe where there is some thickening of the peribronchovascular interstitium and some peripheral bronchiolectasis, similar to prior study from 2015. Other: Gas throughout the venous system of the neck. No non-venous gas is identified in the soft tissues of the neck. IMPRESSION: 1. No acute intracranial abnormalities. 2. No evidence of acute traumatic injury to the cervical spine. 3. Mild cerebral atrophy with extensive chronic microvascular ischemic changes in the cerebral white matter, as above. 4. Extensive venous gas noted throughout the cervical region and in the deep soft tissues of the face, presumably iatrogenic related to recent IV placement. 5. DISH. Electronically Signed   By: Vinnie Langton M.D.   On: 02/08/2017 14:34   Ct Femur Right W Contrast  Result Date: 02/08/2017 CLINICAL DATA:  Small blisters of the right upper thigh EXAM: CT OF THE LOWER RIGHT EXTREMITY WITH CONTRAST TECHNIQUE: Multidetector CT imaging of the lower right extremity was performed according to the standard protocol following intravenous contrast administration. COMPARISON:  None. CONTRAST:  22mL ISOVUE-300 IOPAMIDOL (ISOVUE-300) INJECTION 61% FINDINGS: Bones/Joint/Cartilage Nonacute. No bone destruction or fracture. The visualized right hip joint is slightly narrowed in appearance with minimal  spurring off the femoral head- neck junction and acetabulum. Minimal enthesopathy off the greater trochanter as well. The visualized pubic rami appear intact. The femoral shaft is maintained without acute osseous abnormality. There is patellofemoral joint space narrowing and spurring consistent with osteoarthritis with trace joint effusion. Ligaments Suboptimally assessed by CT. Muscles and Tendons No acute findings.  No intramuscular mass,  hemorrhage or atrophy. Soft tissues Minimal subcutaneous fatty induration along the anteromedial right thigh were there are cutaneous nodules, the largest approximately 13 mm consistent with reported blisters. No abscess is identified. There is mild diffuse atherosclerotic calcifications along the course the femoral artery. IMPRESSION: 1. No acute osseous abnormality. 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 No frank bone destruction. 2. Small cutaneous nodules along the anteromedial proximal right thigh consistent with reported history of soft tissue blisters. Only minimal subcutaneous soft tissue fatty induration is seen deep to these lesions. No abscess or drainable fluid collections are identified. Electronically Signed   By: Ashley Royalty M.D.   On: 02/08/2017 18:32   US Carotid Bilateral  Result Date: 02/09/2017 CLINICAL DATA:  76 year old female with a history of stroke-like symptoms EXAM: BILATERAL CAROTID DUPLEX ULTRASOUND TECHNIQUE: Pearline Cables scale imaging, color Doppler and duplex ultrasound were performed of bilateral carotid and vertebral arteries in the neck. COMPARISON:  CT scan of the head 02/08/2017 FINDINGS: Criteria: Quantification of carotid stenosis is based on velocity parameters that correlate the residual internal carotid diameter with NASCET-based stenosis levels, using the diameter of the distal internal carotid lumen as the denominator for stenosis measurement. The following velocity measurements were obtained: RIGHT ICA:  54/20 cm/sec CCA:   60/45 cm/sec SYSTOLIC ICA/CCA RATIO:  0.8 DIASTOLIC ICA/CCA RATIO:  2.0 ECA:  98 cm/sec LEFT ICA:  48/24 cm/sec CCA:  40/98 cm/sec SYSTOLIC ICA/CCA RATIO:  0.9 DIASTOLIC ICA/CCA RATIO:  2.5 ECA:  57 cm/sec RIGHT CAROTID ARTERY: No significant atherosclerotic plaque or evidence of stenosis. RIGHT VERTEBRAL ARTERY:  Patent with normal antegrade flow. LEFT CAROTID ARTERY: No significant atherosclerotic plaque or evidence of stenosis. LEFT VERTEBRAL ARTERY:  Patent with normal antegrade flow. IMPRESSION: Negative bilateral carotid duplex ultrasound. Signed, Criselda Peaches, MD [facility] Radiology Electronically Signed   By: Jacqulynn Cadet M.D.   On: 02/09/2017 15:49   Dg Hip Unilat W Or Wo Pelvis 2-3 Views Right  Result Date: 02/08/2017 CLINICAL DATA:  Found unconscious outside.  Hip and back pain. EXAM: DG HIP (WITH OR WITHOUT PELVIS) 2-3V RIGHT COMPARISON:  None. FINDINGS: There is no evidence of hip fracture or dislocation. There is no evidence of arthropathy or other focal bone abnormality. IMPRESSION: Negative. Electronically Signed   By: Nelson Chimes M.D.   On: 02/08/2017 14:40    EKG:  Orders placed or performed during the hospital encounter of 02/08/17  . ED EKG  . ED EKG  . EKG 12-Lead  . EKG 12-Lead    ASSESSMENT AND PLAN:  Active Problems:   Subcutaneous emphysema (HCC)   Unresponsive episode   Nausea & vomiting   Acute renal insufficiency   Air embolism (HCC)   Pyuria  #1. Unresponsive episode, likely syncope, carotid ultrasound was normal, echocardiogram was not performed due to recent normal echocardiogram. Patient had atrial fibrillation on telemetry, no long pauses were documented. We'll continue observing telemetry and clinically #2. Nausea and vomiting, resolved, patient is able to tolerate a regular diet #3. Likely Chronic renal insufficiency, no significant improvement with IV fluids, get renal ultrasound #4.  Pyuria, suspected urinary tract infection, urinary cultures are taken, patient is on Keflex orally, follow closely #5. Venous air embolism, iatrogenic, resolved on Cardizem ultrasound, discussed with Dr. dew, no further interventions were recommended #6. Hypokalemia, supplementing intravenously, check magnesium level and supplement as needed, recheck in the morning #7. Right upper thigh. Bulla, likely allergic reaction to sun exposure versus bug  bite, follow closely in the morning  Management plans discussed with the patient, family and they are in agreement.   DRUG ALLERGIES:  Allergies  Allergen Reactions  . Lactose Intolerance (Gi) Other (See Comments)    constipation  . Vicodin [Hydrocodone-Acetaminophen] Itching  . Neurontin [Gabapentin] Other (See Comments)    Drowsy. Makes her feel "funny"    CODE STATUS:     Code Status Orders        Start     Ordered   02/08/17 2008  Full code  Continuous     02/08/17 2007    Code Status History    Date Active Date Inactive Code Status Order ID Comments User Context   12/02/2016  4:02 PM 12/03/2016  8:54 PM Full Code [id number]  Lovenia Kim, MD Inpatient   06/18/2016  9:05 PM 06/18/2016  9:05 PM Full Code [id number]  Reubin Milan, MD Inpatient   06/18/2016  9:05 PM 06/20/2016  7:41 PM Full Code [id number]  Reubin Milan, MD Inpatient      TOTAL TIME TAKING CARE OF THIS PATIENT: 40 minutes.  Discussed with patient's son  Theodoro Grist M.D on 02/09/2017 at 4:24 PM  Between 7am to 6pm - Pager - [phone]  After 6pm go to [email or web address] - password [email or web address] [place]  Office  [phone]  CC: Primary care physician; Nicolette Bang, DO

## 2017-02-09 NOTE — Progress Notes (Signed)
Inpatient Diabetes Program Recommendations  AACE/ADA: New Consensus Statement on Inpatient Glycemic Control (2015)  Target Ranges:  Prepandial:   less than 140 mg/dL      Peak postprandial:   less than 180 mg/dL (1-2 hours)      Critically ill patients:  140 - 180 mg/dL   Lab Results  Component Value Date   GLUCAP 152 (H) 02/09/2017   HGBA1C 10.9 (H) 12/02/2016    Review of Glycemic Control  Results for Kristine Boyer, Kristine Boyer (MRN [id number]) as of 02/09/2017 10:52  Ref. Range 02/08/2017 15:19 02/08/2017 21:13 02/09/2017 07:35  Glucose-Capillary Latest Ref Range: 65 - 99 mg/dL 316 (H) 242 (H) 152 (H)    Diabetes history: Type 2 Outpatient Diabetes medications: Glipizide 5mg  qam Current orders for Inpatient glycemic control: glipizide 5mg  qam, Novolog 0-9 units tid, Novolog 0-5 units qhs  Inpatient Diabetes Program Recommendations:  Please consider stopping Glipizide 5mg  while in hospital.  High risk of hypoglycemia with poor dietary intake (25 % at breakfast).  Continue Novolog correction tid/hs as ordered.   Glipizide may be a risky option at home as well.  If patient has memory issues and forgets to eat, she runs the risk of hypoglycemia.    Gentry Fitz, RN, BA, MHA, CDE Diabetes Coordinator Inpatient Diabetes Program  [phone] (Team Pager) [phone] ([place]) 02/09/2017 10:53 AM

## 2017-02-10 ENCOUNTER — Observation Stay: Payer: Medicare HMO

## 2017-02-10 ENCOUNTER — Telehealth: Payer: Self-pay | Admitting: Internal Medicine

## 2017-02-10 DIAGNOSIS — E876 Hypokalemia: Secondary | ICD-10-CM | POA: Diagnosis not present

## 2017-02-10 DIAGNOSIS — R079 Chest pain, unspecified: Secondary | ICD-10-CM | POA: Diagnosis not present

## 2017-02-10 DIAGNOSIS — R1011 Right upper quadrant pain: Secondary | ICD-10-CM | POA: Diagnosis not present

## 2017-02-10 DIAGNOSIS — N289 Disorder of kidney and ureter, unspecified: Secondary | ICD-10-CM | POA: Diagnosis not present

## 2017-02-10 DIAGNOSIS — R112 Nausea with vomiting, unspecified: Secondary | ICD-10-CM | POA: Diagnosis not present

## 2017-02-10 DIAGNOSIS — R4189 Other symptoms and signs involving cognitive functions and awareness: Secondary | ICD-10-CM | POA: Diagnosis not present

## 2017-02-10 LAB — CBC
HEMATOCRIT: 31.8 % — AB (ref 35.0–47.0)
HEMOGLOBIN: 10.5 g/dL — AB (ref 12.0–16.0)
MCH: 26.8 pg (ref 26.0–34.0)
MCHC: 33 g/dL (ref 32.0–36.0)
MCV: 81.4 fL (ref 80.0–100.0)
Platelets: 142 10*3/uL — ABNORMAL LOW (ref 150–440)
RBC: 3.91 MIL/uL (ref 3.80–5.20)
RDW: 13.8 % (ref 11.5–14.5)
WBC: 11.5 10*3/uL — ABNORMAL HIGH (ref 3.6–11.0)

## 2017-02-10 LAB — GLUCOSE, CAPILLARY
GLUCOSE-CAPILLARY: 217 mg/dL — AB (ref 65–99)
Glucose-Capillary: 162 mg/dL — ABNORMAL HIGH (ref 65–99)

## 2017-02-10 LAB — COMPREHENSIVE METABOLIC PANEL
ALT: 20 U/L (ref 14–54)
AST: 31 U/L (ref 15–41)
Albumin: 3.7 g/dL (ref 3.5–5.0)
Alkaline Phosphatase: 77 U/L (ref 38–126)
Anion gap: 9 (ref 5–15)
BILIRUBIN TOTAL: 0.7 mg/dL (ref 0.3–1.2)
BUN: 12 mg/dL (ref 6–20)
CHLORIDE: 102 mmol/L (ref 101–111)
CO2: 27 mmol/L (ref 22–32)
CREATININE: 0.87 mg/dL (ref 0.44–1.00)
Calcium: 8.3 mg/dL — ABNORMAL LOW (ref 8.9–10.3)
Glucose, Bld: 230 mg/dL — ABNORMAL HIGH (ref 65–99)
Potassium: 3.2 mmol/L — ABNORMAL LOW (ref 3.5–5.1)
Sodium: 138 mmol/L (ref 135–145)
TOTAL PROTEIN: 7.1 g/dL (ref 6.5–8.1)

## 2017-02-10 LAB — URINE CULTURE

## 2017-02-10 LAB — HEMOGLOBIN A1C
Hgb A1c MFr Bld: 11.9 % — ABNORMAL HIGH (ref 4.8–5.6)
Mean Plasma Glucose: 295 mg/dL

## 2017-02-10 LAB — MAGNESIUM: Magnesium: 1.6 mg/dL — ABNORMAL LOW (ref 1.7–2.4)

## 2017-02-10 LAB — TROPONIN I: Troponin I: <0.03 ng/mL (ref <0.03)

## 2017-02-10 MED ORDER — ALUM & MAG HYDROXIDE-SIMETH 200-200-20 MG/5ML PO SUSP
30.0000 mL | ORAL | Status: DC | PRN
  Administered 2017-02-10: 30 mL via ORAL
  Filled 2017-02-10: qty 30

## 2017-02-10 MED ORDER — NITROGLYCERIN 0.4 MG SL SUBL
SUBLINGUAL_TABLET | SUBLINGUAL | Status: AC
  Administered 2017-02-10: 0.4 mg via SUBLINGUAL
  Filled 2017-02-10: qty 1

## 2017-02-10 MED ORDER — PANTOPRAZOLE SODIUM 40 MG IV SOLR
40.0000 mg | Freq: Once | INTRAVENOUS | Status: AC
  Administered 2017-02-10: 40 mg via INTRAVENOUS
  Filled 2017-02-10: qty 40

## 2017-02-10 MED ORDER — MAGNESIUM OXIDE 400 (241.3 MG) MG PO TABS: 400.0000 mg | ORAL_TABLET | Freq: Two times a day (BID) | ORAL | 3 refills | Status: AC

## 2017-02-10 MED ORDER — NITROGLYCERIN 0.4 MG SL SUBL
0.4000 mg | SUBLINGUAL_TABLET | SUBLINGUAL | Status: DC | PRN
  Administered 2017-02-10 (×2): 0.4 mg via SUBLINGUAL

## 2017-02-10 MED ORDER — CEPHALEXIN 500 MG PO CAPS
500.0000 mg | ORAL_CAPSULE | Freq: Two times a day (BID) | ORAL | Status: DC
  Administered 2017-02-10: 500 mg via ORAL
  Filled 2017-02-10: qty 1

## 2017-02-10 MED ORDER — POTASSIUM CHLORIDE 20 MEQ PO PACK
40.0000 meq | PACK | ORAL | Status: AC
  Administered 2017-02-10 (×2): 40 meq via ORAL
  Filled 2017-02-10 (×2): qty 2

## 2017-02-10 MED ORDER — MAGNESIUM SULFATE 4 GM/100ML IV SOLN
4.0000 g | Freq: Once | INTRAVENOUS | Status: DC
  Filled 2017-02-10: qty 100

## 2017-02-10 MED ORDER — MORPHINE SULFATE (PF) 2 MG/ML IV SOLN
1.0000 mg | INTRAVENOUS | Status: DC | PRN
  Administered 2017-02-10: 1 mg via INTRAVENOUS
  Filled 2017-02-10: qty 1

## 2017-02-10 MED ORDER — MAGNESIUM OXIDE 400 (241.3 MG) MG PO TABS
400.0000 mg | ORAL_TABLET | Freq: Two times a day (BID) | ORAL | Status: DC
  Administered 2017-02-10: 400 mg via ORAL
  Filled 2017-02-10: qty 1

## 2017-02-10 MED ORDER — CEPHALEXIN 250 MG/5ML PO SUSR: 500.0000 mg | Freq: Two times a day (BID) | ORAL | Status: DC

## 2017-02-10 MED ORDER — LIVING WELL WITH DIABETES BOOK
Freq: Once | Status: AC
  Administered 2017-02-10: 10:00:00
  Filled 2017-02-10: qty 1

## 2017-02-10 NOTE — Progress Notes (Addendum)
Inpatient Diabetes Program Recommendations  AACE/ADA: New Consensus Statement on Inpatient Glycemic Control (2015)  Target Ranges:  Prepandial:   less than 140 mg/dL      Peak postprandial:   less than 180 mg/dL (1-2 hours)      Critically ill patients:  140 - 180 mg/dL   Results for Kristine Boyer, Kristine Boyer (MRN [id number]) as of 02/10/2017 09:27  Ref. Range 02/09/2017 07:35 02/09/2017 11:44 02/09/2017 16:47 02/09/2017 21:02 02/10/2017 08:36  Glucose-Capillary Latest Ref Range: 65 - 99 mg/dL 152 (H) 75 105 (H) 184 (H) 217 (H)   Review of Glycemic Control  Diabetes history: DM2 Outpatient Diabetes medications: Glipizide 5 mg QAM Current orders for Inpatient glycemic control: Novolog 0-9 units TID with meals, Novolog 0-5 units QHS, Glipizide 5 mg QAM  Inpatient Diabetes Program Recommendations:  HgbA1C: A1C 11.9% on 02/08/17 indicating an average glucose of 295 mg/dl over the past 2-3 months. Patient will need to follow up with PCP (likely needs referral to Endocrinologist) regarding worsening DM control.  NOTE: In reviewing chart, noted patient seen Dr. Juleen China at [facility] on 12/16/16 and per her note "Worsening of A1c from 8.9 to 10.9. Will elect not to increase Glipizide due to hypoglycemia risk particularly in the elderly and patient not currently monitoring CBGs. Have asked patient to make an appointment within the next 1-2 weeks with Dr. Valentina Lucks (PharmD) for medication management. Have asked patient to begin to monitor fasting CBGs prior to this appointment." Patient is eligible for [facility] Care Management (per chart) and [place] consult placed on 02/09/17.   Addendum 02/10/17@14 :00-Spoke with patient and her son Kristine Boyer) about diabetes and home regimen for diabetes control. Patient's son reports that his mother has dementia and can not remember to check glucose or take medications on her own. Kristine Boyer reports that she stays with him for 2 weeks and then stays with his sister for 2  weeks and they provide her medications as prescribed. Kristine Boyer reports that he recently purchased a glucometer and starting checking his mother's glucose but he stated he does not have a lot of knowledge about DM or what he needs to do to help his mother with DM management. Provided Living Well with Diabetes booklet.  Discussed his mother's A1C results (11.9% on 02/08/17) and explained what an A1C is and informed him that his mother's current A1C indicates an average glucose of 295 mg/dl over the past 2-3 months. Discussed basic pathophysiology of DM Type 2, basic home care, importance of checking CBGs and maintaining good CBG control to prevent long-term and short-term complications.  Explained how hyperglycemia leads to damage within blood vessels which lead to the common complications seen with uncontrolled diabetes. Reviewed glucose and A1C goals.  Reviewed signs and symptoms of hyperglycemia and hypoglycemia along with treatment for both. Discussed impact of nutrition, exercise, stress, sickness, and medications on diabetes control. Kristine Boyer reports that his mother is bad about eating sweets and if there are any in the house she will eat them excessively.  Kristine Boyer reports that changes will be made with his mother's diet and sweets will be limited.  Reviewed Living Well with diabetes booklet and encouraged Kristine Boyer to read through entire book to enhance knowledge on DM.  Encouraged Kristine Boyer to be sure his mother's glucose is checked as prescribed and encouraged to keep a record of glucose readings so adjustments can be made with DM medications if needed.  Kristine Boyer verbalized understanding of information discussed and he states that he has no  further questions at this time related to diabetes.  Thanks, Barnie Alderman, RN, MSN, CDE Diabetes Coordinator Inpatient Diabetes Program [phone] (Team Pager from 8am to 5pm)

## 2017-02-10 NOTE — Telephone Encounter (Signed)
Attempted to return son's call twice. Phone not set up for VM. Was able to reach daughter at home phone but she was unsure what questions needed to be addressed. Was only aware that paitent was being cared for by hospitalist group at [place], [place]. 02/10/2017, 2:56 PM PGY-2, [place]

## 2017-02-10 NOTE — Telephone Encounter (Signed)
Pt is in the hospital and pt's son has a couple questions for PCP. ep

## 2017-02-10 NOTE — Consult Note (Signed)
[facility] CM Inpatient Consult   02/10/2017  Kristine Boyer 07-08-1941 [id number]  Referral received to assess for care management services. Met with the patient regarding the benefits of [facility] Care Management services. Explained that [facility] Care Management is a covered benefit of patient's [facility].   Explained that [facility] Care Management does not interfere with or replace any services arranged by the inpatient care management staff.  Patient declined services with [facility] Care Management at this time. For questions or concerns please contact:  Timohty Renbarger RN, BSN [facility] Liaison  [phone]) Business Mobile [phone]) Toll free office

## 2017-02-10 NOTE — Progress Notes (Signed)
Per MD do not give IV magnesium as pt does not have IV access per MD okay to hold off on starting new IV as MD ordered oral magnesium. Also per MD okay not to give metoprolol as pt already had early this am.

## 2017-02-10 NOTE — Progress Notes (Signed)
Pt A and O x 4, pt confused. VSS. Pt tolerating diet well. No complaints of pain or nausea. IV removed by pt earlier this AM intact, prescriptions sent to pharmacy. Pt's son voiced understanding of discharge instructions with no further questions. Pt discharged via wheelchair with axillary.

## 2017-02-10 NOTE — Significant Event (Signed)
Rapid Response Event Note  Overview: Time Called: 0052 Arrival Time: 0055 Event Type: Cardiac  Initial Focused Assessment: RR called for patient with complaints of chest pain near epigastric area.  Patient reports pain 8/10, but unable to describe pain.  Bedside RN reports giving patient Maalox prior to calling RR.  Interventions:  EKG and sublingual nitro ordered per protocol.  Patient afib on cardiac monitor.    Plan of Care (if not transferred):  EKG shows afib with no acute changes from prior EKG.  Patient given sublingual nitro x2 with relief of pain. Patient states "the pain has moved to my stomach area" and stating she is passing gas now which is helping relieve the pain.  Dr. Ara Kussmaul to see patient.     Event Summary: Name of Physician Notified: Dr. Almyra Free at [place]  Outcome: Stayed in room and stabalized  Event End Time: 0130  Zettie Pho

## 2017-02-10 NOTE — Progress Notes (Signed)
Notified by tech that pt spit out a pill when she got up to use the restroom. No meds given by day shift so far this Am, med disposed of in sharps container

## 2017-02-10 NOTE — Progress Notes (Signed)
Patient ID: Kristine Boyer, female   DOB: [birth year], 76 y.o.   MRN: [id number]  Called by Nursing to evaluate patient for chest pain. Rapid response was called secondary to patient complaining of midsternal and upper epigastric chest pain. Patient states that pain was localized, nonradiating, associated with some weakness and dizziness when she tried to stand up. She also noted to have some belching. At that time she was hypertensive and hypoxic to 85% on room air. Patient received 2 sublingual nitroglycerin and Maalox which seems to have helped.  On physical exam patient appears fatigued, slightly anxious. HEENT is normal cephalic atraumatic because numbers are moist, pupils equal Heart is irregularly irregular prominence of S1 and S2 Abdomen is soft, nondistended with midepigastric tenderness to palpation, no reproducible chest wall tenderness. Extremities no cyanosis clubbing or edema, no calf tenderness C&S today to 3 cranial nerves II-12 are grossly intact though focal sensorimotor deficits  EKG was done and is essentially unchanged from admission EKG. The rhythm is atrial fibrillation there are some T-wave inversions in leads aVF, V5 and V6.[name]  Thus far patient has received 2 sublingual nitroglycerin with good relief of her symptoms. I have ordered stat labs include a CBC, CMP, troponins, magnesium. I have also ordered morphine for severe pain and Protonix for indigestion. If she remains hypertensive we may need to use IV beta blocker. Patient is allergic to aspirin. She is already on eloquent, Lipitor, Avapro, metoprolol. Continue to monitor on telemetry.

## 2017-02-10 NOTE — Discharge Summary (Signed)
[facility] NAME: Kristine Boyer    MR#:  [id number]  DATE OF BIRTH:  [birth year]  DATE OF ADMISSION:  02/08/2017 ADMITTING PHYSICIAN: Loletha Grayer, MD  DATE OF DISCHARGE: No discharge date for patient encounter.  PRIMARY CARE PHYSICIAN: Nicolette Bang, DO     ADMISSION DIAGNOSIS:  Rash [R21] Pain [R52]  DISCHARGE DIAGNOSIS:  Active Problems:   Subcutaneous emphysema (HCC)   Unresponsive episode   Nausea & vomiting   Acute renal insufficiency   Air embolism (HCC)   Pyuria   SECONDARY DIAGNOSIS:   Past Medical History:  Diagnosis Date  . Abdominal pain 10/04/2013  . Adenomatous colon polyp   . Allergy   . Altered sensation, foot 04/26/2012  . Alzheimer disease   . Anemia   . Bipolar affective ([place])   . Blood transfusion without reported diagnosis   . BPPV (benign paroxysmal positional vertigo) 05/18/2011  . Cataract   . Chest pain 06/18/2016  . De Quervain's syndrome (tenosynovitis) 02/23/2011   Left wrist. Has worked w/ sports medicine and received steroid injection w/o relief.   . Diabetes mellitus   . Dysphagia 08/28/2013  . Dysuria 03/21/2014  . Flank pain 08/28/2013  . Foot pain, right 11/11/2010  . GERD (gastroesophageal reflux disease)   . Hyperlipidemia   . Hypertension   . Hypokalemia 03/18/2011   Takes potassium pills if develops muscle cramps. Eats lots of bananas. 11/2011   . Hypothyroidism   . Immune deficiency disorder ([place])   . Internal hemorrhoids   . Leg swelling 02/14/2013  . Mass of oropharynx 05/15/2014  . Musculoskeletal pain 08/16/2012  . Scalp pain 01/16/2015  . Sciatica 10/08/2014  . Seizures ([place])   . Stiffness of neck 05/16/2014    .pro HOSPITAL COURSE:   Patient is 76 year old African female with past medical history significant for history of hypertension, hyperlipidemia, Alzheimer disease,, who was brought to the hospital with unresponsiveness and vomiting.  Apparently patient was found outside sleeping with some vomitus on her shirt. On arrival to the office, which she was noted to have a little blister and the right anterior thigh. Hospitalist services were contacted and patient was admitted. Radiologic studies revealed venous air embolism throughout the cervical region and in the deep tissues of the face. Labs showed pyuria, concerning for urinary tract infection, mild renal insufficiency. Urine cultures were taken. Patient was admitted to the hospital for further evaluation and treatment. She was initiated on antibiotic therapy, IV fluids and her condition improved. She had carotid ultrasound, which was normal. I discussed the case of venous gas in cervical region and deep tissues of the face with vascular surgeon, Dr. Lucky Cowboy, who recommended no intervention, but observation. Right upper anterior thigh blister was felt to be allergic reaction due to sun exposure or bug bite, no evidence of cellulitis was noted, however. In regards to pyuria, as mentioned above. Urine cultures were taken and patient was initiated on antibiotic therapy. Patient is to continue antibiotics to complete course. Orthostatic vital signs were normal. Echocardiogram was not performed, since patient had echocardiogram in September 2017, which was normal. Patient had no other symptoms. EKG on admission to emergency room revealed atrial fibrillation, rate in 90s, patient remained in atrial fibrillation while in the hospital, however, no long pauses were documented. The patient was about to be discharged to home, however, repeated labs revealed potassium level of 2.6. Discharge was held. Overnight patient developed midsternal and  upper epigastric chest pain, which was described as localized, nonradiating, associated with some weakness, dizziness, belching. At that time. Patient was hypertensive and hypoxic with oxygen saturations of 85% on room air. She received 2 sublingual nitroglycerin and Maalox  which helped. Patient, physical exam was unremarkable. EKG revealed A. fib and T-wave inversions in aVF, V5 and V6. Patient was continued on metoprolol, Avapro, Lipitor, her cardiac enzymes were checked and they were negative. Cardiologist consultation was requested, however, patient's family wanted her to go home and the cardiologist consult was changed to outpatient, per family's request. Right upper quadrant ultrasound was performed at it was found to be negative, but small right-sided pleural effusion. Patient's potassium was rechecked in the morning and it was found to be improved, magnesium level was low, however, patient's family and patient refused an IV line replacement and IV magnesium administration. Magnesium was prescribed for patient as pills. Patient's labs 02/10/2017 revealed a mild elevation of white blood cell count, which was felt to be nonspecific, patient had no fevers. She was felt to be stable to be discharged home. Discussion by problem: #1. Unresponsive episode, likely syncope, carotid ultrasound was normal, echocardiogram was not performed due to recent normal echocardiogram. Patient had atrial fibrillation on telemetry, no long pauses were documented. The patient was advised to follow-up with Dr. Nehemiah Massed as outpatient for further recommendations, especially in view of chest pains patient experienced in the hospital #2. Nausea and vomiting, resolved, patient was able to tolerate  regular diet #3. Acute renal insufficiency, resolved on IV fluids, renal ultrasound was normal #4. Pyuria, suspected urinary tract infection, urinary cultures revealed multiple species, recollection was suggested, continue the patient on Keflex orally to complete course  #5. Venous air embolism, iatrogenic,  discussed with Dr. Lucky Cowboy, no further interventions were recommended , not seen on carotid ultrasound  #6. Hypokalemia,  hypomagnesemia, supplemented intravenously and orally,  recheck as outpatient #7  Right upper thigh. [place], likely allergic reaction to sun exposure versus bug bite, follow for resolution as outpatient #8. Chest pain of unclear etiology, cardiac enzymes were negative, patient is to continue metoprolol, follow up with cardiologist, Dr. Nehemiah Massed as outpatient, echocardiogram was not repeated, since it was done recently and was normal. CONSULTS OBTAINED:  Treatment Team:  Florene Glen, MD  DRUG ALLERGIES:   Allergies  Allergen Reactions  . Lactose Intolerance (Gi) Other (See Comments)    constipation  . Vicodin [Hydrocodone-Acetaminophen] Itching  . Neurontin [Gabapentin] Other (See Comments)    Drowsy. Makes her feel "funny"    DISCHARGE MEDICATIONS:   Current Discharge Medication List    START taking these medications   Details  cephALEXin (KEFLEX) 500 MG capsule Take 1 capsule (500 mg total) by mouth 2 (two) times daily. Qty: 10 capsule, Refills: 0    guaiFENesin-dextromethorphan (ROBITUSSIN DM) 100-10 MG/5ML syrup Take 5 mLs by mouth every 4 (four) hours as needed for cough. Qty: 118 mL, Refills: 0    magnesium oxide (MAG-OX) 400 (241.3 Mg) MG tablet Take 1 tablet (400 mg total) by mouth 2 (two) times daily. Qty: 60 tablet, Refills: 3      CONTINUE these medications which have NOT CHANGED   Details  atorvastatin (LIPITOR) 40 MG tablet Take 1 tablet (40 mg total) by mouth daily. Qty: 90 tablet, Refills: 3   Associated Diagnoses: Hyperlipemia    docusate sodium (COLACE) 100 MG capsule Take 1 capsule (100 mg total) by mouth 2 (two) times daily. Qty: 10 capsule, Refills: 0  ELIQUIS 5 MG TABS tablet TAKE 1 TABLET (5 MG TOTAL) BY MOUTH 2 TIMES DAILY. Qty: 60 tablet, Refills: 5    hydrOXYzine (ATARAX/VISTARIL) 25 MG tablet Take 1 tablet (25 mg total) by mouth 3 (three) times daily as needed. Qty: 30 tablet, Refills: 2    levothyroxine (SYNTHROID, LEVOTHROID) 50 MCG tablet TAKE 1 TABLET (50 MCG TOTAL) BY MOUTH DAILY. Qty: 90 tablet, Refills: 0      Melatonin 10 MG TABS Take 10 mg by mouth at bedtime.    metoprolol (TOPROL-XL) 200 MG 24 hr tablet TAKE 1 TABLET (200 MG TOTAL) BY MOUTH DAILY. Qty: 90 tablet, Refills: 3    polyethylene glycol powder (GLYCOLAX/MIRALAX) powder Take 17 g by mouth daily. Qty: 500 g, Refills: 1   Associated Diagnoses: Constipation, unspecified constipation type    senna (SENOKOT) 8.6 MG tablet Take 2 tablets (17.2 mg total) by mouth 2 (two) times daily. Once achieve daily BM decrease to one tablet daily Qty: 30 tablet, Refills: 0   Associated Diagnoses: Abdominal pain, other specified site    traZODone (DESYREL) 50 MG tablet Take 0.5-1 tablets (25-50 mg total) by mouth at bedtime as needed for sleep. Qty: 30 tablet, Refills: 3    valsartan (DIOVAN) 320 MG tablet TAKE 1 TABLET (320 MG TOTAL) BY MOUTH DAILY. Qty: 90 tablet, Refills: 0    ACCU-CHEK FASTCLIX LANCETS MISC 1 each by Does not apply route daily before breakfast. Qty: 102 each, Refills: 3    Blood Glucose Monitoring Suppl (ACCU-CHEK AVIVA PLUS) W/DEVICE KIT Please check your blood sugar once per day when you wake up. Qty: 1 kit, Refills: 0    glucose blood test strip 1 each by Other route daily before breakfast. Qty: 100 each, Refills: 12      STOP taking these medications     glipiZIDE (GLUCOTROL) 5 MG tablet      hydrochlorothiazide (HYDRODIURIL) 25 MG tablet          . Patient is to follow-up with primary care physician and cardiologist as outpatient   If you experience worsening of your admission symptoms, develop shortness of breath, life threatening emergency, suicidal or homicidal thoughts you must seek medical attention immediately by calling 911 or calling your MD immediately  if symptoms less severe.  You Must read complete instructions/literature along with all the possible adverse reactions/side effects for all the Medicines you take and that have been prescribed to you. Take any new Medicines after you have completely  understood and accept all the possible adverse reactions/side effects.   Please note  You were cared for by a hospitalist during your hospital stay. If you have any questions about your discharge medications or the care you received while you were in the hospital after you are discharged, you can call the unit and asked to speak with the hospitalist on call if the hospitalist that took care of you is not available. Once you are discharged, your primary care physician will handle any further medical issues. Please note that NO REFILLS for any discharge medications will be authorized once you are discharged, as it is imperative that you return to your primary care physician (or establish a relationship with a primary care physician if you do not have one) for your aftercare needs so that they can reassess your need for medications and monitor your lab values.    Today   CHIEF COMPLAINT:   Chief Complaint  Patient presents with  . Altered Mental Status  HISTORY OF PRESENT ILLNESS:    VITAL SIGNS:  Blood pressure (!) 144/84, pulse 73, temperature 98.2 F (36.8 C), temperature source Oral, resp. rate 20, height _0  (1.6 m), weight 82.8 kg (182 lb 8.7 oz), SpO2 100 %.  I/O:   Intake/Output Summary (Last 24 hours) at 02/10/17 1411 Last data filed at 02/10/17 1212  Gross per 24 hour  Intake             2097 ml  Output             1100 ml  Net              997 ml    PHYSICAL EXAMINATION:  GENERAL:  76 y.o.-year-old patient lying in the bed with no acute distress.  EYES: Pupils equal, round, reactive to light and accommodation. No scleral icterus. Extraocular muscles intact.  HEENT: Head atraumatic, normocephalic. Oropharynx and nasopharynx clear.  NECK:  Supple, no jugular venous distention. No thyroid enlargement, no tenderness.  LUNGS: Normal breath sounds bilaterally, no wheezing, rales,rhonchi or crepitation. No use of accessory muscles of respiration.  CARDIOVASCULAR: S1, S2  normal. No murmurs, rubs, or gallops.  ABDOMEN: Soft, non-tender, non-distended. Bowel sounds present. No organomegaly or mass.  EXTREMITIES: No pedal edema, cyanosis, or clubbing.  NEUROLOGIC: Cranial nerves II through XII are intact. Muscle strength 5/5 in all extremities. Sensation intact. Gait not checked.  PSYCHIATRIC: The patient is alert and oriented x 3.  SKIN: No obvious rash, lesion, or ulcer.   DATA REVIEW:   CBC  Recent Labs Lab 02/10/17 0128  WBC 11.5*  HGB 10.5*  HCT 31.8*  PLT 142*    Chemistries   Recent Labs Lab 02/10/17 0128  NA 138  K 3.2*  CL 102  CO2 27  GLUCOSE 230*  BUN 12  CREATININE 0.87  CALCIUM 8.3*  MG 1.6*  AST 31  ALT 20  ALKPHOS 77  BILITOT 0.7    Cardiac Enzymes  Recent Labs Lab 02/10/17 1326  TROPONINI <0.03    Microbiology Results  Results for orders placed or performed during the hospital encounter of 02/08/17  Urine culture     Status: Abnormal   Collection Time: 02/08/17  3:23 PM  Result Value Ref Range Status   Specimen Description URINE, CATHETERIZED  Final   Special Requests NONE  Final   Culture MULTIPLE SPECIES PRESENT, SUGGEST RECOLLECTION (A)  Final   Report Status 02/10/2017 FINAL  Final    RADIOLOGY:  Dg Chest 2 View  Result Date: 02/08/2017 CLINICAL DATA:  Found unresponsive. EXAM: CHEST  2 VIEW COMPARISON:  12/02/2016 FINDINGS: The heart size and mediastinal contours are within normal limits. Low bilateral lung volumes with bibasilar atelectasis. There is no evidence of pulmonary edema, consolidation, pneumothorax, nodule or pleural fluid. Stable degenerative disease of the thoracic spine. IMPRESSION: No active cardiopulmonary disease. Electronically Signed   By: Aletta Edouard M.D.   On: 02/08/2017 14:39   Ct Head Wo Contrast  Result Date: 02/08/2017 CLINICAL DATA:  76 year old female found down on the floor today. EXAM: CT HEAD WITHOUT CONTRAST CT CERVICAL SPINE WITHOUT CONTRAST TECHNIQUE:  Multidetector CT imaging of the head and cervical spine was performed following the standard protocol without intravenous contrast. Multiplanar CT image reconstructions of the cervical spine were also generated. COMPARISON:  Head CT 11/05/2016.  Neck CT 05/16/2014. FINDINGS: CT HEAD FINDINGS Brain: Mild cerebral atrophy. Patchy and confluent areas of decreased attenuation are noted throughout the deep and periventricular white matter  of the cerebral hemispheres bilaterally, compatible with chronic microvascular ischemic disease. No evidence of acute infarction, hemorrhage, hydrocephalus, extra-axial collection or mass lesion/mass effect. Vascular: No hyperdense vessel or unexpected calcification. Skull: Normal. Negative for fracture or focal lesion. Sinuses/Orbits: No acute finding. Other: Multiple small locules of gas are noted in the deep soft tissues of the face. CT CERVICAL SPINE FINDINGS Alignment: Normal. Skull base and vertebrae: No acute fracture. No primary bone lesion or focal pathologic process. Soft tissues and spinal canal: No prevertebral fluid or swelling. No visible canal hematoma. Disc levels: Moderate multilevel degenerative disc disease, most pronounced at C5-C6. Mild multilevel facet arthropathy. Anterior bridging osteophytes throughout the cervical spine, compatible with underlying diffuse idiopathic skeletal hyperostosis. This is most pronounced at C1-C2 and C3-C4. Upper chest: Again noted is some scarring in the visualized lung apices, most evident in the right upper lobe where there is some thickening of the peribronchovascular interstitium and some peripheral bronchiolectasis, similar to prior study from 2015. Other: Gas throughout the venous system of the neck. No non-venous gas is identified in the soft tissues of the neck. IMPRESSION: 1. No acute intracranial abnormalities. 2. No evidence of acute traumatic injury to the cervical spine. 3. Mild cerebral atrophy with extensive chronic  microvascular ischemic changes in the cerebral white matter, as above. 4. Extensive venous gas noted throughout the cervical region and in the deep soft tissues of the face, presumably iatrogenic related to recent IV placement. 5. DISH. Electronically Signed   By: Vinnie Langton M.D.   On: 02/08/2017 14:34   Ct Cervical Spine Wo Contrast  Result Date: 02/08/2017 CLINICAL DATA:  76 year old female found down on the floor today. EXAM: CT HEAD WITHOUT CONTRAST CT CERVICAL SPINE WITHOUT CONTRAST TECHNIQUE: Multidetector CT imaging of the head and cervical spine was performed following the standard protocol without intravenous contrast. Multiplanar CT image reconstructions of the cervical spine were also generated. COMPARISON:  Head CT 11/05/2016.  Neck CT 05/16/2014. FINDINGS: CT HEAD FINDINGS Brain: Mild cerebral atrophy. Patchy and confluent areas of decreased attenuation are noted throughout the deep and periventricular white matter of the cerebral hemispheres bilaterally, compatible with chronic microvascular ischemic disease. No evidence of acute infarction, hemorrhage, hydrocephalus, extra-axial collection or mass lesion/mass effect. Vascular: No hyperdense vessel or unexpected calcification. Skull: Normal. Negative for fracture or focal lesion. Sinuses/Orbits: No acute finding. Other: Multiple small locules of gas are noted in the deep soft tissues of the face. CT CERVICAL SPINE FINDINGS Alignment: Normal. Skull base and vertebrae: No acute fracture. No primary bone lesion or focal pathologic process. Soft tissues and spinal canal: No prevertebral fluid or swelling. No visible canal hematoma. Disc levels: Moderate multilevel degenerative disc disease, most pronounced at C5-C6. Mild multilevel facet arthropathy. Anterior bridging osteophytes throughout the cervical spine, compatible with underlying diffuse idiopathic skeletal hyperostosis. This is most pronounced at C1-C2 and C3-C4. Upper chest: Again noted  is some scarring in the visualized lung apices, most evident in the right upper lobe where there is some thickening of the peribronchovascular interstitium and some peripheral bronchiolectasis, similar to prior study from 2015. Other: Gas throughout the venous system of the neck. No non-venous gas is identified in the soft tissues of the neck. IMPRESSION: 1. No acute intracranial abnormalities. 2. No evidence of acute traumatic injury to the cervical spine. 3. Mild cerebral atrophy with extensive chronic microvascular ischemic changes in the cerebral white matter, as above. 4. Extensive venous gas noted throughout the cervical region and in the deep soft  tissues of the face, presumably iatrogenic related to recent IV placement. 5. DISH. Electronically Signed   By: Vinnie Langton M.D.   On: 02/08/2017 14:34   US Renal  Result Date: 02/09/2017 CLINICAL DATA:  76 year old female with renal insufficiency. Initial encounter. EXAM: RENAL / URINARY TRACT ULTRASOUND COMPLETE COMPARISON:  None. FINDINGS: Right Kidney: Length: 10.7 cm. Echogenicity within normal limits. No mass or hydronephrosis visualized. Left Kidney: Length: 10.1 cm. Echogenicity within normal limits. No mass or hydronephrosis visualized. Bladder: Appears normal for degree of bladder distention. IMPRESSION: Negative exam. Electronically Signed   By: Genia Del M.D.   On: 02/09/2017 18:04   Ct Femur Right W Contrast  Result Date: 02/08/2017 CLINICAL DATA:  Small blisters of the right upper thigh EXAM: CT OF THE LOWER RIGHT EXTREMITY WITH CONTRAST TECHNIQUE: Multidetector CT imaging of the lower right extremity was performed according to the standard protocol following intravenous contrast administration. COMPARISON:  None. CONTRAST:  65m ISOVUE-300 IOPAMIDOL (ISOVUE-300) INJECTION 61% FINDINGS: Bones/Joint/Cartilage Nonacute. No bone destruction or fracture. The visualized right hip joint is slightly narrowed in appearance with minimal  spurring off the femoral head- neck junction and acetabulum. Minimal enthesopathy off the greater trochanter as well. The visualized pubic rami appear intact. The femoral shaft is maintained without acute osseous abnormality. There is patellofemoral joint space narrowing and spurring consistent with osteoarthritis with trace joint effusion. Ligaments Suboptimally assessed by CT. Muscles and Tendons No acute findings.  No intramuscular mass, hemorrhage or atrophy. Soft tissues Minimal subcutaneous fatty induration along the anteromedial right thigh were there are cutaneous nodules, the largest approximately 13 mm consistent with reported blisters. No abscess is identified. There is mild diffuse atherosclerotic calcifications along the course the femoral artery. IMPRESSION: 1. No acute osseous abnormality. 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 No frank bone destruction. 2. Small cutaneous nodules along the anteromedial proximal right thigh consistent with reported history of soft tissue blisters. Only minimal subcutaneous soft tissue fatty induration is seen deep to these lesions. No abscess or drainable fluid collections are identified. Electronically Signed   By: DAshley RoyaltyM.D.   On: 02/08/2017 18:32   UKoreaCarotid Bilateral  Result Date: 02/09/2017 CLINICAL DATA:  76year old female with a history of stroke-like symptoms EXAM: BILATERAL CAROTID DUPLEX ULTRASOUND TECHNIQUE: GPearline Cablesscale imaging, color Doppler and duplex ultrasound were performed of bilateral carotid and vertebral arteries in the neck. COMPARISON:  CT scan of the head 02/08/2017 FINDINGS: Criteria: Quantification of carotid stenosis is based on velocity parameters that correlate the residual internal carotid diameter with NASCET-based stenosis levels, using the diameter of the distal internal carotid lumen as the denominator for stenosis measurement. The following velocity measurements were obtained: RIGHT ICA:  54/20 cm/sec CCA:   754/65cm/sec SYSTOLIC ICA/CCA RATIO:  0.8 DIASTOLIC ICA/CCA RATIO:  2.0 ECA:  98 cm/sec LEFT ICA:  48/24 cm/sec CCA:  768/12cm/sec SYSTOLIC ICA/CCA RATIO:  0.9 DIASTOLIC ICA/CCA RATIO:  2.5 ECA:  57 cm/sec RIGHT CAROTID ARTERY: No significant atherosclerotic plaque or evidence of stenosis. RIGHT VERTEBRAL ARTERY:  Patent with normal antegrade flow. LEFT CAROTID ARTERY: No significant atherosclerotic plaque or evidence of stenosis. LEFT VERTEBRAL ARTERY:  Patent with normal antegrade flow. IMPRESSION: Negative bilateral carotid duplex ultrasound. Signed, HCriselda Peaches MD [facility] Electronically Signed   By: HJacqulynn CadetM.D.   On: 02/09/2017 15:49   Dg Hip Unilat W Or Wo Pelvis 2-3 Views Right  Result Date: 02/08/2017 CLINICAL  DATA:  Found unconscious outside.  Hip and back pain. EXAM: DG HIP (WITH OR WITHOUT PELVIS) 2-3V RIGHT COMPARISON:  None. FINDINGS: There is no evidence of hip fracture or dislocation. There is no evidence of arthropathy or other focal bone abnormality. IMPRESSION: Negative. Electronically Signed   By: Nelson Chimes M.D.   On: 02/08/2017 14:40   US Abdomen Limited Ruq  Result Date: 02/10/2017 CLINICAL DATA:  Right upper quadrant pain for 4 days. EXAM: US ABDOMEN LIMITED - RIGHT UPPER QUADRANT COMPARISON:  None. FINDINGS: Gallbladder: No gallstones or wall thickening visualized. No sonographic Murphy sign noted by sonographer. Common bile duct: Diameter: 4.0 mm Liver: Normal echogenicity without focal lesion or biliary dilatation. Other: Right pleural effusion suspected. IMPRESSION: Unremarkable right upper quadrant ultrasound examination. Small right pleural effusion suspected. Electronically Signed   By: Marijo Sanes M.D.   On: 02/10/2017 10:03    EKG:   Orders placed or performed during the hospital encounter of 02/08/17  . ED EKG  . ED EKG  . EKG 12-Lead  . EKG 12-Lead  . EKG 12-Lead  . EKG 12-Lead        Management plans discussed with the patient, family and they are in agreement.  CODE STATUS:     Code Status Orders        Start     Ordered   02/08/17 2008  Full code  Continuous     02/08/17 2007    Code Status History    Date Active Date Inactive Code Status Order ID Comments User Context   12/02/2016  4:02 PM 12/03/2016  8:54 PM Full Code [id number]  Lovenia Kim, MD Inpatient   06/18/2016  9:05 PM 06/18/2016  9:05 PM Full Code [id number]  Reubin Milan, MD Inpatient   06/18/2016  9:05 PM 06/20/2016  7:41 PM Full Code [id number]  Reubin Milan, MD Inpatient      TOTAL TIME TAKING CARE OF THIS PATIENT: 40 minutes.    Theodoro Grist M.D on 02/10/2017 at 2:11 PM  Between 7am to 6pm - Pager - [phone]  After 6pm go to [email or web address] - password [email or web address] [facility]  Office  [phone]  CC: Primary care physician; Nicolette Bang, DO

## 2017-02-10 NOTE — Progress Notes (Addendum)
Per MD hold oral meds at this time until after abdominal ultrasound. Okay to administer insulin.

## 2017-02-10 NOTE — Progress Notes (Addendum)
Pt's son spoke with Dr. Ether Griffins as she had a couple of concerns related to the patient going home. Pt's son agreed to setup cardiology appointment due to pt experiencing chest pain last night. MD also had concerns related to pt's appetite. Son stated that he would be able to get pt to eat at home.

## 2017-02-11 ENCOUNTER — Ambulatory Visit (INDEPENDENT_AMBULATORY_CARE_PROVIDER_SITE_OTHER): Payer: Medicare HMO | Admitting: Family Medicine

## 2017-02-11 ENCOUNTER — Other Ambulatory Visit: Payer: Self-pay

## 2017-02-11 DIAGNOSIS — F0281 Dementia in other diseases classified elsewhere with behavioral disturbance: Secondary | ICD-10-CM | POA: Diagnosis not present

## 2017-02-11 DIAGNOSIS — G301 Alzheimer's disease with late onset: Secondary | ICD-10-CM | POA: Diagnosis not present

## 2017-02-11 DIAGNOSIS — F02818 Alzheimer's disease with late onset: Secondary | ICD-10-CM

## 2017-02-11 MED ORDER — OXYCODONE HCL 5 MG PO TABS: 2.5000 mg | ORAL_TABLET | Freq: Four times a day (QID) | ORAL | 0 refills | Status: AC | PRN

## 2017-02-11 MED ORDER — QUETIAPINE FUMARATE 25 MG PO TABS: 25.0000 mg | ORAL_TABLET | Freq: Every day | ORAL | 0 refills | Status: DC

## 2017-02-11 NOTE — Patient Instructions (Signed)
Start Seroquel  25 mg at night to help with agitation and paranoia.  Will follow up in 2 weeks to see how she is doing with med.

## 2017-02-11 NOTE — Progress Notes (Unsigned)
Patient son states she is taking only senna and sometimes miralax

## 2017-02-15 ENCOUNTER — Encounter: Payer: Self-pay | Admitting: Family Medicine

## 2017-02-15 NOTE — Telephone Encounter (Signed)
Pt has been released from the hospital. Has a hospital F/U appt with Dr. Juleen China on 02/17/17. Ottis Stain, CMA

## 2017-02-15 NOTE — Assessment & Plan Note (Addendum)
FAST Stage: 5 to 6 with need for supervision and cueing to dress appropriatelyand hygiene.  memory loss, slow information processing and difficulty with abstract concepts decreased problem solving and decreased safety awareness Medication or substances that may impair patient's cognition: hydroxyzine Behavioral and Psychological Symptoms of Dementia: Paranoia with resistance to care provision, Wandering/Impaired safety awareness, inappropriate behavior around failing to wear clothing Nonpharmacologic treatments of patient: redirection, distraction.  Response: variable.  Pt's dgt has attended classes on caring for patients with dementia.  She had previously taken care of her father with dementia until the end of his life.  Recommend trial of Quetiapine 25 mg bedtime for next 2 to 3 weeks, dgt may not give when pt is staying with her.  Supportive Interventions for caregiver (support group or individual counseling): Education of caregivers  Physical Activity recommendation: involvement in Therapist, art, legal and medical advanced planning recommended to patient and caregivers: Son is  Considering ALF placment. Dgt says she will not place patient into LTC.  She plans on keeping her mother at home.  Follow up appointment in 2 to 3 weeks to assess benefits of antipsychotic therapy, need for titration, changing therapy, or stopping therapy.  Will Request Casimer Lanius, LCSW to speak with son about the steps that would need to happen for Long term care placement of his mother.  There does not appear to be formal guardianship of Ms Clouse.

## 2017-02-15 NOTE — Progress Notes (Signed)
   Subjective:    Patient ID: Kristine Boyer, female    DOB: 04-02-1941, 76 y.o.   MRN: [id number] [name] is accompanied by patient, son and daughter Sources of clinical information for visit is/are patient, relative(s) and past medical records. I spoke with each sibling individually.  Nursing assessment for this office visit was reviewed with the patient for accuracy and revision.  Patient stays two weeks at a time alternating between her dgt's home and her son's home.  HPI Dementia / Cognitive impairment - Onset: diagnosed 10/29/16 in [place] in ADLs / iADLs: needs assitance or dependence in iADLs - Belief that others have stolen items/money that she has hidden or misplaced. Belief that others are saying negative things about her. She gets angry with others - no hallucinations. -  aggression / inappropriate behaviors treated with Atarax without benefit.  -  Recent wander into her home's backyard where she mayhave become ill from environmental heat exposure. - Coming out of her bedroom without clothing covering below waist.  - Dementia Medications ; None - Adverse effects: Syncope, bradycardia, seizure, GI bleeding, urinary obstruction, urinary frequency, incontinence. nausea/diarrhea, anorexia, weight loss, somnolence - Behavoral and Psychiatric Medications: Atarax [place] Cognitive Assessment  10/29/2016  Visuospatial/ Executive (0/5) 1  Naming (0/3) 2  Attention: Read list of digits (0/2) 2  Attention: Read list of letters (0/1) 1  Attention: Serial 7 subtraction starting at 100 (0/3) 0  Language: Repeat phrase (0/2) 1  Language : Fluency (0/1) 0  Abstraction (0/2) 0  Delayed Recall (0/5) 1  Orientation (0/6) 4  Total 12  Adjusted Score (based on education) 13   Pt requires supervision or cueing for dressing and hygiene.  - PMH: Admitted to [facility] 02/08/17 thru 02/10/17 with transient neurologic event possibly related to  outdoor heat exposure and AKI.  Patient had gone into backyard of her home without supervision. Pt found to have cervical air  venous air embolism and air locules in deep soft tissue of face thought to be iatrogenic from recent IV placement.  She was found by her son unresponsive and with vomitus on her self and clothing.  Pt treated with antibiotics for possible UTI: DC to complete course with Keflex. Urine culture grew multiple species. Pt treated for hypokalemia and hypomagnesemia.  DC withoral MagOxide. Pt instructed to stop HCTZ and Glipizide.   Pt discharged to follow up with cardiologist for her permanent atrial fibrillation  No smoking   Review of Systems No fever No chest pain No shortness of breath    Objective:   Physical Exam  VS reviewed General: Alert, cooperative, pleasant, groomed Gait: normal gait, swing and stance, + step through. No line of gait did not cross midline.        Assessment & Plan:  30 minutes face to face where spent in total with counseling / coordination of care took more than 50% of the total time. Counseling involved discussion of the multiple cognitive tests and function test results with patient and wife. Discussion on diagnosis and natural history of dementia.   We discussed his case with the Casimer Lanius, LCSW.  Patient's son wants to explore how to get LTC for his mother,.

## 2017-02-17 ENCOUNTER — Encounter: Payer: Self-pay | Admitting: Internal Medicine

## 2017-02-17 ENCOUNTER — Ambulatory Visit (INDEPENDENT_AMBULATORY_CARE_PROVIDER_SITE_OTHER): Payer: Medicare HMO | Admitting: Internal Medicine

## 2017-02-17 VITALS — BP 140/80 | HR 73 | Temp 97.8°F | Ht 63.0 in | Wt 182.2 lb

## 2017-02-17 DIAGNOSIS — I4891 Unspecified atrial fibrillation: Secondary | ICD-10-CM | POA: Diagnosis not present

## 2017-02-17 DIAGNOSIS — N390 Urinary tract infection, site not specified: Secondary | ICD-10-CM | POA: Diagnosis not present

## 2017-02-17 DIAGNOSIS — E878 Other disorders of electrolyte and fluid balance, not elsewhere classified: Secondary | ICD-10-CM

## 2017-02-17 MED ORDER — METOPROLOL SUCCINATE ER 200 MG PO TB24: 200.0000 mg | ORAL_TABLET | Freq: Every day | ORAL | 3 refills | Status: AC

## 2017-02-17 NOTE — Progress Notes (Signed)
Subjective:    Kristine Boyer - 76 y.o. female MRN [id number]  Date of birth: [birth year]  HPI  Kristine Boyer is here for hospital follow up. Patient was admitted at [facility] on 02/08/17 after being found unresponsive with vomitus after being found sleeping outside. Hospital course was complicated by the following:   Unresponsive Episode: Thought to be related to syncope. Carotid dopplers were obtained and were normal. Echo was not obtained as patient had recently normal echo within the past year.   UTI: UA with pyuria. Urine culture with multiple species. Patient was treated for suspected UTI and instructed to complete a course of Keflex upon discharge.   AFib: Patient with known history of Afib. EKG at presentation revealed Afib with rate in the 90s. Patient remained in Afib throughout hospitalization. She developed chest pain during hospitalization with hypoxia and hypertension. She received 2 nitroglycerin and Maalox which helped with her symptoms. Cardiac enzymes were negative. Patient's family declined inpatient cardiology consult and requested outpatient follow up. Patient reports compliance with her Eliquis and Metoprolol 200 mg daily.   Electrolyte Abnormalities: Patient found to have K 2.6. Her HCTZ was discontinued at discharge. K on day of discharge was 3.2 and Mg was found to be 1.6. Patient refused IV magnesium and was discharged with PO magnesium (400 mg BID).    -  reports that she quit smoking about 5 years ago. She has never used smokeless tobacco. - Review of Systems: Per HPI. - Past Medical History: Patient Active Problem List   Diagnosis Date Noted  . Unresponsive episode 02/09/2017  . Nausea & vomiting 02/09/2017  . Acute renal insufficiency 02/09/2017  . Air embolism ([place]) 02/09/2017  . Pyuria 02/09/2017  . Subcutaneous emphysema ([place]) 02/08/2017  . Insomnia 12/17/2016  . Nonspecific chest pain   . Atypical atrial flutter ([place])   . Essential hypertension    . Chest pain 12/02/2016  . Incontinence 11/02/2016  . Hearing impairment, Left Ear 11/02/2016  . Dementia with behavioral disturbance 11/02/2016  . Atrial fibrillation ([place]) 06/18/2016  . Angina pectoris, unspecified 08/05/2015  . CN (constipation) 12/17/2014  . Dysphagia 08/28/2013  . Unspecified constipation 07/28/2013  . Forgetfulness 07/18/2013  . Breast nodule 01/03/2013  . Lung mass 08/05/2011  . OSA (obstructive sleep apnea) 06/12/2011  . Bulging disc 06/12/2011  . BPPV (benign paroxysmal positional vertigo) 05/18/2011  . BACK PAIN, LUMBAR 11/28/2009  . OBESITY 09/11/2008  . Allergic rhinitis 01/19/2008  . Hypothyroidism 11/25/2006  . Diabetes mellitus type 2, uncontrolled ([place]) 11/25/2006  . Hyperlipidemia 11/25/2006  . HYPERTENSION, BENIGN SYSTEMIC 11/25/2006  . GASTROESOPHAGEAL REFLUX, NO ESOPHAGITIS 11/25/2006  . OSTEOARTHRITIS, MULTI SITES 11/25/2006   - Medications: reviewed and updated   Objective:   Physical Exam BP 140/80 (BP Location: Left Arm, Patient Position: Sitting, Cuff Size: Normal)   Pulse 73   Temp 97.8 F (36.6 C) (Oral)   Ht 5\' 3"  (1.6 m)   Wt 182 lb 3.2 oz (82.6 kg)   SpO2 99%   BMI 32.28 kg/m  Gen: NAD, alert, cooperative with exam, well-appearing CV: irregularly irregular, no murmur, no edema Resp: CTABL, no wheezes, non-labored Skin: Area of blister has popped on the right inner thigh. Patient with smooth burn like area with good granulation tissue without surrounding erythema or increased warmth. No drainage present.   Assessment & Plan:   1. Atrial fibrillation, unspecified type [facility]) Patient currently in Afib rate controlled. Anticoagulated for CHADSVASC score of 5.  -continue Eliquis  5 mg BID  -continue Metoprolol 200 mg daily  -follow up with cardiology   2. Urinary tract infection without hematuria, site unspecified Patient s/p Keflex and currently asymptomatic.   3. Electrolyte abnormality Patient has discontinued HCTZ  due to low K and has been taking Mg supplement daily. BP is stable with d/c of HCTZ so will not plan to resume at present.  - Basic Metabolic Panel - Magnesium  4. Burn Patient with well healing burn-like area that occurred from popping of a large blistering sunburn. No signs of surrounding soft tissue infection.  -apply antibiotic ointment BID-TID and keep area covered with bandage  -return precautions such as spreading erythema, fevers, purulent drainage, etc discussed  -second generation such as anti-histamine for itching prn    Phill Myron, D.O. 02/17/2017, 2:01 PM PGY-2, [place]

## 2017-02-17 NOTE — Patient Instructions (Addendum)
Please return for a follow up visit to address your chronic medical conditions.   Please make a follow up visit with her cardiologist, Dr. Nehemiah Massed for her Afib and the chest pain. I have sent a refill of her Metoprolol to the pharmacy.

## 2017-02-18 LAB — BASIC METABOLIC PANEL
BUN / CREAT RATIO: 9 — AB (ref 12–28)
BUN: 9 mg/dL (ref 8–27)
CALCIUM: 9.1 mg/dL (ref 8.7–10.3)
CHLORIDE: 103 mmol/L (ref 96–106)
CO2: 29 mmol/L (ref 18–29)
CREATININE: 1.01 mg/dL — AB (ref 0.57–1.00)
GFR calc Af Amer: 63 mL/min/{1.73_m2} (ref >59)
GFR calc non Af Amer: 55 mL/min/{1.73_m2} — ABNORMAL LOW (ref >59)
Glucose: 211 mg/dL — ABNORMAL HIGH (ref 65–99)
Potassium: 4.4 mmol/L (ref 3.5–5.2)
Sodium: 142 mmol/L (ref 134–144)

## 2017-02-18 LAB — MAGNESIUM: MAGNESIUM: 2.4 mg/dL — AB (ref 1.6–2.3)

## 2017-02-22 ENCOUNTER — Other Ambulatory Visit: Payer: Self-pay | Admitting: Internal Medicine

## 2017-02-23 ENCOUNTER — Telehealth: Payer: Self-pay | Admitting: Internal Medicine

## 2017-02-23 NOTE — Telephone Encounter (Signed)
Daughter takes turns with brother taking care of pt and needs a nurse a to go over pt's current med list. Daughter also states pt has a wound on her leg, it was a blister that burst. Daughter has been putting neosporin on it and band aids. Wants to know if it is ok to give Tylenol and what can she give pt for itching? ep

## 2017-02-23 NOTE — Telephone Encounter (Signed)
Return call to patient's daughter to discuss medication list.  Review medications dosage and directions. Regarding blister on patient's leg; denies fever.  Blister itch and painful.  Advised patient could take 1 tylenol tablet/capsule as needed for pain. They could try hydrocortisone cream around blister for itching.  It Tylenol or hydrocortisone cream does not work to call for an appointment. Will forward to PCP for further advise.  Derl Barrow, RN

## 2017-02-24 ENCOUNTER — Telehealth: Payer: Self-pay | Admitting: Internal Medicine

## 2017-02-24 NOTE — Telephone Encounter (Signed)
I was unable to get in contact with daughter; left voice message. Spoke to son and scheduled DM f/u appt for 6/18 with PCP. - Mesha Guinyard

## 2017-02-25 ENCOUNTER — Encounter: Payer: Self-pay | Admitting: Student

## 2017-02-25 ENCOUNTER — Ambulatory Visit (INDEPENDENT_AMBULATORY_CARE_PROVIDER_SITE_OTHER): Payer: Medicare HMO | Admitting: Student

## 2017-02-25 DIAGNOSIS — R238 Other skin changes: Secondary | ICD-10-CM

## 2017-02-25 DIAGNOSIS — T148XXA Other injury of unspecified body region, initial encounter: Secondary | ICD-10-CM | POA: Insufficient documentation

## 2017-02-25 NOTE — Progress Notes (Signed)
Subjective:    Kristine Boyer is a 76 y.o. old female here "sunburn on her right inner thigh". Patient is here with her daughter.   HPI Skin burn: reports sunburn over the inner aspect of her thigh proximally. She had possible syncopal episode about two weeks ago for which she was hospitalized at [facility]. She thinks she had a sunburn when she had syncopal episode on her porch. She states noting a big blister on her inner right thigh after she left the hospital. The blister bursted later.  Not sure about the color of the drain. She was seen by PCP about a week ago and was advised to apply antibiotic ointment two to three times a day and cover the wound with bandage. Daughter following through on the recommendation. She says pain has gotten better. Denies active drainage except for some yellowish stuff on the bandage when she changes.  Denies fever, chills, surrounding skin redness or swelling.   Patient has history of dementia and poorly controlled diabetes. She is not on any medication.  PMH/Problem List: has Hypothyroidism; Diabetes mellitus type 2, uncontrolled ([place]); Hyperlipidemia; OBESITY; HYPERTENSION, BENIGN SYSTEMIC; Allergic rhinitis; GASTROESOPHAGEAL REFLUX, NO ESOPHAGITIS; OSTEOARTHRITIS, MULTI SITES; BACK PAIN, LUMBAR; BPPV (benign paroxysmal positional vertigo); OSA (obstructive sleep apnea); Bulging disc; Lung mass; Breast nodule; Forgetfulness; Unspecified constipation; Dysphagia; CN (constipation); Angina pectoris, unspecified; Atrial fibrillation ([place]); Incontinence; Hearing impairment, Left Ear; Dementia with behavioral disturbance; Chest pain; Nonspecific chest pain; Atypical atrial flutter ([place]); Essential hypertension; Insomnia; Subcutaneous emphysema ([place]); Unresponsive episode; Nausea & vomiting; Acute renal insufficiency; Air embolism ([place]); Pyuria; and Wound of skin on her problem list.   has a past medical history of Abdominal pain (10/04/2013); Adenomatous colon polyp;  Allergy; Altered sensation, foot (04/26/2012); Alzheimer disease; Anemia; Bipolar affective ([place]); Blood transfusion without reported diagnosis; BPPV (benign paroxysmal positional vertigo) (05/18/2011); [place]; Chest pain (06/18/2016); De Quervain's syndrome (tenosynovitis) (02/23/2011); Diabetes mellitus; Dysphagia (08/28/2013); Dysuria (03/21/2014); Flank pain (08/28/2013); Foot pain, right (11/11/2010); GERD (gastroesophageal reflux disease); Hyperlipidemia; Hypertension; Hypokalemia (03/18/2011); Hypothyroidism; Immune deficiency disorder ([place]); Internal hemorrhoids; Leg swelling (02/14/2013); Mass of oropharynx (05/15/2014); Musculoskeletal pain (08/16/2012); Scalp pain (01/16/2015); Sciatica (10/08/2014); Seizures ([place]); and Stiffness of neck (05/16/2014).  FH:  Family History  Problem Relation Age of Onset  . Diabetes Mother   . CAD Mother   . Alcohol abuse Father   . Stomach cancer Sister   . Colon cancer Unknown        grandmother    [place] Social History  Substance Use Topics  . Smoking status: Former Smoker    Quit date: 10/13/2011  . Smokeless tobacco: Never Used  . Alcohol use No    Review of Systems Review of systems negative except for pertinent positives and negatives in history of present illness above.     Objective:     Vitals:   02/25/17 1421  BP: 124/72  Pulse: 69  Temp: 98.1 F (36.7 C)  TempSrc: Oral  SpO2: 98%  Weight: 176 lb (79.8 kg)  Height: 5\' 3"  (1.6 m)    Physical Exam GEN: appears well, no apparent distress. CVS: irregularly irregular, S1&S2, no murmurs, no edema RESP: no IWOB, good air movement bilaterally, CTAB GI: BS present & normal, soft, NTND rebound, no mass MSK: no focal tenderness or notable swelling SKIN: noted area of healing duck-shaped wound with granulation tissue over the inner aspect of her right thigh proximally. The wound measures about 1.5 inch along its long axis and 0.75 inch the other way. No surrounding  skin erythema or underlying  fluid loculation. See picture for more.        Assessment and Plan:  Wound of skin Healing well. Good granulation tissue. No signs of skin erythema or fluid loculation. Recommended daily dressing and keeping it dry and clean. Discussed return precautions including fever, chills, drainage, surrounding skin erythema or worsening pain. Can do Tylenol as needed for pain. Off note, she has poorly controlled diabetes with A1c of 11.9 and she is not on medication. She has multiple comorbidity including dementia. I advised her to follow up with her PCP on this.   Mercy Riding, MD 02/25/17 Pager: [phone]

## 2017-02-25 NOTE — Assessment & Plan Note (Addendum)
Healing well. Good granulation tissue. No signs of skin erythema or fluid loculation. Recommended daily dressing and keeping it dry and clean. Discussed return precautions including fever, chills, drainage, surrounding skin erythema or worsening pain. Can do Tylenol as needed for pain. Off note, she has poorly controlled diabetes with A1c of 11.9 and she is not on medication. She has multiple comorbidity including dementia. I advised her to follow up with her PCP on this.

## 2017-02-25 NOTE — Patient Instructions (Addendum)
It was great seeing you today! We have addressed the following issues today 1. Right thigh wound: this is healing well. Continue daily dressing. Seek immediate care if you have fever, increased drainage, worsening pain or other symptoms concerning to you.   If we did any lab work today, and the results require attention, either me or my nurse will get in touch with you. If everything is normal, you will get a letter in mail and a message via . If you don't hear from [place] in two weeks, please give [place] a call. Otherwise, we look forward to seeing you again at your next visit. If you have any questions or concerns before then, please call the clinic at [phone].  Please bring all your medications to every doctors visit  Sign up for My Chart to have easy access to your labs results, and communication with your Primary care physician.    Please check-out at the front desk before leaving the clinic.    Take Care,   Dr. Cyndia Skeeters

## 2017-02-28 ENCOUNTER — Telehealth: Payer: Self-pay | Admitting: Obstetrics and Gynecology

## 2017-02-28 NOTE — Telephone Encounter (Signed)
Family Medicine Emergency Line Telephone Note   Patient calls stating that her right thigh is sore and hurting. She was seen in clinic on 5/31 for skin blister. She is referring to this area. She states that it looks improved and she has been cleaning it with medical soap and water. Denies fevers, drainage, erythema. Having pain but hasn't taken anything for it. She would like to come in to have someone look at the wound to make sure it is healing well. Scheduled her a nurse visit for tomorrow for wound check.   Patient voiced understanding and agreed to plan.   Luiz Blare, DO 02/28/2017, 8:36 PM PGY-3, [place]

## 2017-03-01 ENCOUNTER — Inpatient Hospital Stay (HOSPITAL_COMMUNITY): Payer: Medicare HMO

## 2017-03-01 ENCOUNTER — Inpatient Hospital Stay (HOSPITAL_COMMUNITY)
Admission: EM | Admit: 2017-03-01 | Discharge: 2017-03-28 | DRG: 064 | Disposition: E | Payer: Medicare HMO | Attending: Internal Medicine | Admitting: Internal Medicine

## 2017-03-01 ENCOUNTER — Ambulatory Visit: Payer: Medicare HMO

## 2017-03-01 ENCOUNTER — Encounter (HOSPITAL_COMMUNITY): Payer: Self-pay | Admitting: Vascular Surgery

## 2017-03-01 ENCOUNTER — Other Ambulatory Visit: Payer: Self-pay

## 2017-03-01 ENCOUNTER — Emergency Department (HOSPITAL_COMMUNITY): Payer: Medicare HMO

## 2017-03-01 DIAGNOSIS — I629 Nontraumatic intracranial hemorrhage, unspecified: Secondary | ICD-10-CM | POA: Diagnosis not present

## 2017-03-01 DIAGNOSIS — R069 Unspecified abnormalities of breathing: Secondary | ICD-10-CM

## 2017-03-01 DIAGNOSIS — T17908A Unspecified foreign body in respiratory tract, part unspecified causing other injury, initial encounter: Secondary | ICD-10-CM

## 2017-03-01 DIAGNOSIS — I618 Other nontraumatic intracerebral hemorrhage: Secondary | ICD-10-CM | POA: Diagnosis not present

## 2017-03-01 DIAGNOSIS — E039 Hypothyroidism, unspecified: Secondary | ICD-10-CM | POA: Diagnosis not present

## 2017-03-01 DIAGNOSIS — E119 Type 2 diabetes mellitus without complications: Secondary | ICD-10-CM | POA: Diagnosis not present

## 2017-03-01 DIAGNOSIS — Z8249 Family history of ischemic heart disease and other diseases of the circulatory system: Secondary | ICD-10-CM

## 2017-03-01 DIAGNOSIS — I509 Heart failure, unspecified: Secondary | ICD-10-CM | POA: Diagnosis not present

## 2017-03-01 DIAGNOSIS — Z90710 Acquired absence of both cervix and uterus: Secondary | ICD-10-CM | POA: Diagnosis not present

## 2017-03-01 DIAGNOSIS — J690 Pneumonitis due to inhalation of food and vomit: Secondary | ICD-10-CM | POA: Diagnosis not present

## 2017-03-01 DIAGNOSIS — R4702 Dysphasia: Secondary | ICD-10-CM | POA: Diagnosis not present

## 2017-03-01 DIAGNOSIS — J9690 Respiratory failure, unspecified, unspecified whether with hypoxia or hypercapnia: Secondary | ICD-10-CM | POA: Diagnosis not present

## 2017-03-01 DIAGNOSIS — Z888 Allergy status to other drugs, medicaments and biological substances status: Secondary | ICD-10-CM

## 2017-03-01 DIAGNOSIS — Z7901 Long term (current) use of anticoagulants: Secondary | ICD-10-CM | POA: Diagnosis not present

## 2017-03-01 DIAGNOSIS — Z7189 Other specified counseling: Secondary | ICD-10-CM | POA: Diagnosis not present

## 2017-03-01 DIAGNOSIS — I69134 Monoplegia of upper limb following nontraumatic intracerebral hemorrhage affecting left non-dominant side: Secondary | ICD-10-CM | POA: Diagnosis not present

## 2017-03-01 DIAGNOSIS — Z885 Allergy status to narcotic agent status: Secondary | ICD-10-CM

## 2017-03-01 DIAGNOSIS — M159 Polyosteoarthritis, unspecified: Secondary | ICD-10-CM | POA: Diagnosis present

## 2017-03-01 DIAGNOSIS — E1151 Type 2 diabetes mellitus with diabetic peripheral angiopathy without gangrene: Secondary | ICD-10-CM | POA: Diagnosis present

## 2017-03-01 DIAGNOSIS — M79642 Pain in left hand: Secondary | ICD-10-CM | POA: Diagnosis not present

## 2017-03-01 DIAGNOSIS — N179 Acute kidney failure, unspecified: Secondary | ICD-10-CM | POA: Diagnosis not present

## 2017-03-01 DIAGNOSIS — R1312 Dysphagia, oropharyngeal phase: Secondary | ICD-10-CM | POA: Diagnosis not present

## 2017-03-01 DIAGNOSIS — R52 Pain, unspecified: Secondary | ICD-10-CM

## 2017-03-01 DIAGNOSIS — Z4682 Encounter for fitting and adjustment of non-vascular catheter: Secondary | ICD-10-CM | POA: Diagnosis not present

## 2017-03-01 DIAGNOSIS — R29810 Facial weakness: Secondary | ICD-10-CM | POA: Diagnosis not present

## 2017-03-01 DIAGNOSIS — I110 Hypertensive heart disease with heart failure: Secondary | ICD-10-CM | POA: Diagnosis present

## 2017-03-01 DIAGNOSIS — G8191 Hemiplegia, unspecified affecting right dominant side: Secondary | ICD-10-CM | POA: Diagnosis present

## 2017-03-01 DIAGNOSIS — Z833 Family history of diabetes mellitus: Secondary | ICD-10-CM

## 2017-03-01 DIAGNOSIS — Z515 Encounter for palliative care: Secondary | ICD-10-CM | POA: Diagnosis not present

## 2017-03-01 DIAGNOSIS — R29713 NIHSS score 13: Secondary | ICD-10-CM | POA: Diagnosis present

## 2017-03-01 DIAGNOSIS — I610 Nontraumatic intracerebral hemorrhage in hemisphere, subcortical: Secondary | ICD-10-CM | POA: Diagnosis not present

## 2017-03-01 DIAGNOSIS — I5033 Acute on chronic diastolic (congestive) heart failure: Secondary | ICD-10-CM | POA: Diagnosis not present

## 2017-03-01 DIAGNOSIS — K219 Gastro-esophageal reflux disease without esophagitis: Secondary | ICD-10-CM | POA: Diagnosis present

## 2017-03-01 DIAGNOSIS — I161 Hypertensive emergency: Secondary | ICD-10-CM | POA: Diagnosis not present

## 2017-03-01 DIAGNOSIS — E876 Hypokalemia: Secondary | ICD-10-CM | POA: Diagnosis not present

## 2017-03-01 DIAGNOSIS — R1310 Dysphagia, unspecified: Secondary | ICD-10-CM | POA: Diagnosis present

## 2017-03-01 DIAGNOSIS — F319 Bipolar disorder, unspecified: Secondary | ICD-10-CM | POA: Diagnosis present

## 2017-03-01 DIAGNOSIS — I480 Paroxysmal atrial fibrillation: Secondary | ICD-10-CM | POA: Diagnosis not present

## 2017-03-01 DIAGNOSIS — I482 Chronic atrial fibrillation: Secondary | ICD-10-CM | POA: Diagnosis not present

## 2017-03-01 DIAGNOSIS — R0682 Tachypnea, not elsewhere classified: Secondary | ICD-10-CM | POA: Diagnosis not present

## 2017-03-01 DIAGNOSIS — F0280 Dementia in other diseases classified elsewhere without behavioral disturbance: Secondary | ICD-10-CM | POA: Diagnosis not present

## 2017-03-01 DIAGNOSIS — D62 Acute posthemorrhagic anemia: Secondary | ICD-10-CM | POA: Diagnosis not present

## 2017-03-01 DIAGNOSIS — J9600 Acute respiratory failure, unspecified whether with hypoxia or hypercapnia: Secondary | ICD-10-CM

## 2017-03-01 DIAGNOSIS — G9340 Encephalopathy, unspecified: Secondary | ICD-10-CM | POA: Diagnosis not present

## 2017-03-01 DIAGNOSIS — I6789 Other cerebrovascular disease: Secondary | ICD-10-CM | POA: Diagnosis not present

## 2017-03-01 DIAGNOSIS — Z6831 Body mass index (BMI) 31.0-31.9, adult: Secondary | ICD-10-CM

## 2017-03-01 DIAGNOSIS — Z66 Do not resuscitate: Secondary | ICD-10-CM | POA: Diagnosis not present

## 2017-03-01 DIAGNOSIS — F0390 Unspecified dementia without behavioral disturbance: Secondary | ICD-10-CM | POA: Diagnosis not present

## 2017-03-01 DIAGNOSIS — Z800 Family history of malignant neoplasm of digestive organs: Secondary | ICD-10-CM

## 2017-03-01 DIAGNOSIS — Z811 Family history of alcohol abuse and dependence: Secondary | ICD-10-CM

## 2017-03-01 DIAGNOSIS — T17908D Unspecified foreign body in respiratory tract, part unspecified causing other injury, subsequent encounter: Secondary | ICD-10-CM | POA: Diagnosis not present

## 2017-03-01 DIAGNOSIS — E1159 Type 2 diabetes mellitus with other circulatory complications: Secondary | ICD-10-CM | POA: Diagnosis not present

## 2017-03-01 DIAGNOSIS — I612 Nontraumatic intracerebral hemorrhage in hemisphere, unspecified: Secondary | ICD-10-CM | POA: Diagnosis not present

## 2017-03-01 DIAGNOSIS — G309 Alzheimer's disease, unspecified: Secondary | ICD-10-CM | POA: Diagnosis not present

## 2017-03-01 DIAGNOSIS — G936 Cerebral edema: Secondary | ICD-10-CM | POA: Diagnosis present

## 2017-03-01 DIAGNOSIS — Z86010 Personal history of colonic polyps: Secondary | ICD-10-CM | POA: Diagnosis not present

## 2017-03-01 DIAGNOSIS — E785 Hyperlipidemia, unspecified: Secondary | ICD-10-CM | POA: Diagnosis not present

## 2017-03-01 DIAGNOSIS — I10 Essential (primary) hypertension: Secondary | ICD-10-CM

## 2017-03-01 DIAGNOSIS — E669 Obesity, unspecified: Secondary | ICD-10-CM | POA: Diagnosis present

## 2017-03-01 DIAGNOSIS — Z87891 Personal history of nicotine dependence: Secondary | ICD-10-CM | POA: Diagnosis not present

## 2017-03-01 DIAGNOSIS — I619 Nontraumatic intracerebral hemorrhage, unspecified: Secondary | ICD-10-CM | POA: Diagnosis present

## 2017-03-01 DIAGNOSIS — E739 Lactose intolerance, unspecified: Secondary | ICD-10-CM | POA: Diagnosis present

## 2017-03-01 DIAGNOSIS — R569 Unspecified convulsions: Secondary | ICD-10-CM | POA: Diagnosis present

## 2017-03-01 DIAGNOSIS — R0989 Other specified symptoms and signs involving the circulatory and respiratory systems: Secondary | ICD-10-CM

## 2017-03-01 DIAGNOSIS — J9601 Acute respiratory failure with hypoxia: Secondary | ICD-10-CM | POA: Diagnosis not present

## 2017-03-01 DIAGNOSIS — R531 Weakness: Secondary | ICD-10-CM | POA: Diagnosis not present

## 2017-03-01 DIAGNOSIS — R4701 Aphasia: Secondary | ICD-10-CM | POA: Diagnosis present

## 2017-03-01 DIAGNOSIS — R4781 Slurred speech: Secondary | ICD-10-CM | POA: Diagnosis not present

## 2017-03-01 LAB — RAPID URINE DRUG SCREEN, HOSP PERFORMED
AMPHETAMINES: NOT DETECTED
BARBITURATES: NOT DETECTED
BENZODIAZEPINES: NOT DETECTED
Cocaine: NOT DETECTED
Opiates: NOT DETECTED
TETRAHYDROCANNABINOL: NOT DETECTED

## 2017-03-01 LAB — COMPREHENSIVE METABOLIC PANEL
ALT: 21 U/L (ref 14–54)
AST: 33 U/L (ref 15–41)
Albumin: 3.6 g/dL (ref 3.5–5.0)
Alkaline Phosphatase: 93 U/L (ref 38–126)
Anion gap: 8 (ref 5–15)
BUN: 8 mg/dL (ref 6–20)
CHLORIDE: 103 mmol/L (ref 101–111)
CO2: 28 mmol/L (ref 22–32)
CREATININE: 0.97 mg/dL (ref 0.44–1.00)
Calcium: 8.9 mg/dL (ref 8.9–10.3)
GFR calc Af Amer: >60 mL/min (ref >60)
GFR calc non Af Amer: 56 mL/min — ABNORMAL LOW (ref >60)
Glucose, Bld: 219 mg/dL — ABNORMAL HIGH (ref 65–99)
Potassium: 3.7 mmol/L (ref 3.5–5.1)
SODIUM: 139 mmol/L (ref 135–145)
Total Bilirubin: 0.5 mg/dL (ref 0.3–1.2)
Total Protein: 7.3 g/dL (ref 6.5–8.1)

## 2017-03-01 LAB — DIFFERENTIAL
BASOS PCT: 0 %
Basophils Absolute: 0 10*3/uL (ref 0.0–0.1)
Eosinophils Absolute: 0.1 10*3/uL (ref 0.0–0.7)
Eosinophils Relative: 1 %
Lymphocytes Relative: 23 %
Lymphs Abs: 1.8 10*3/uL (ref 0.7–4.0)
MONO ABS: 0.5 10*3/uL (ref 0.1–1.0)
MONOS PCT: 7 %
NEUTROS ABS: 5.4 10*3/uL (ref 1.7–7.7)
Neutrophils Relative %: 69 %

## 2017-03-01 LAB — URINALYSIS, ROUTINE W REFLEX MICROSCOPIC
BILIRUBIN URINE: NEGATIVE
Bacteria, UA: NONE SEEN
HGB URINE DIPSTICK: NEGATIVE
Ketones, ur: NEGATIVE mg/dL
Leukocytes, UA: NEGATIVE
NITRITE: NEGATIVE
PH: 8 (ref 5.0–8.0)
Protein, ur: NEGATIVE mg/dL
SPECIFIC GRAVITY, URINE: 1.006 (ref 1.005–1.030)
Squamous Epithelial / LPF: NONE SEEN

## 2017-03-01 LAB — CBC
HEMATOCRIT: 32.4 % — AB (ref 36.0–46.0)
Hemoglobin: 10.4 g/dL — ABNORMAL LOW (ref 12.0–15.0)
MCH: 26.2 pg (ref 26.0–34.0)
MCHC: 32.1 g/dL (ref 30.0–36.0)
MCV: 81.6 fL (ref 78.0–100.0)
PLATELETS: 174 10*3/uL (ref 150–400)
RBC: 3.97 MIL/uL (ref 3.87–5.11)
RDW: 13.2 % (ref 11.5–15.5)
WBC: 7.8 10*3/uL (ref 4.0–10.5)

## 2017-03-01 LAB — I-STAT CHEM 8, ED
BUN: 8 mg/dL (ref 6–20)
CALCIUM ION: 1.14 mmol/L — AB (ref 1.15–1.40)
CHLORIDE: 101 mmol/L (ref 101–111)
Creatinine, Ser: 0.9 mg/dL (ref 0.44–1.00)
GLUCOSE: 224 mg/dL — AB (ref 65–99)
HCT: 32 % — ABNORMAL LOW (ref 36.0–46.0)
Hemoglobin: 10.9 g/dL — ABNORMAL LOW (ref 12.0–15.0)
Potassium: 3.7 mmol/L (ref 3.5–5.1)
Sodium: 141 mmol/L (ref 135–145)
TCO2: 28 mmol/L (ref 0–100)

## 2017-03-01 LAB — GLUCOSE, CAPILLARY
Glucose-Capillary: 177 mg/dL — ABNORMAL HIGH (ref 65–99)
Glucose-Capillary: 135 mg/dL — ABNORMAL HIGH (ref 65–99)

## 2017-03-01 LAB — PROTIME-INR
INR: 1.12
Prothrombin Time: 14.5 seconds (ref 11.4–15.2)

## 2017-03-01 LAB — I-STAT TROPONIN, ED: Troponin i, poc: 0 ng/mL (ref 0.00–0.08)

## 2017-03-01 LAB — APTT: aPTT: 31 seconds (ref 24–36)

## 2017-03-01 LAB — MRSA PCR SCREENING: MRSA BY PCR: NEGATIVE

## 2017-03-01 LAB — ETHANOL

## 2017-03-01 MED ORDER — SODIUM CHLORIDE 0.9 % IV SOLN
100.0000 mL/h | INTRAVENOUS | Status: DC
  Administered 2017-03-01: 100 mL/h via INTRAVENOUS

## 2017-03-01 MED ORDER — NICARDIPINE HCL IN NACL 20-0.86 MG/200ML-% IV SOLN: 0.0000 mg/h | INTRAVENOUS | Status: DC

## 2017-03-01 MED ORDER — SODIUM CHLORIDE 0.9 % IV SOLN
INTRAVENOUS | Status: DC
  Administered 2017-03-01 – 2017-03-05 (×4): via INTRAVENOUS
  Administered 2017-03-05: 1000 mL via INTRAVENOUS
  Administered 2017-03-08: 01:00:00 via INTRAVENOUS

## 2017-03-01 MED ORDER — PANTOPRAZOLE SODIUM 40 MG IV SOLR
40.0000 mg | Freq: Every day | INTRAVENOUS | Status: DC
  Administered 2017-03-01 – 2017-03-03 (×3): 40 mg via INTRAVENOUS
  Filled 2017-03-01 (×3): qty 40

## 2017-03-01 MED ORDER — SENNOSIDES-DOCUSATE SODIUM 8.6-50 MG PO TABS
1.0000 | ORAL_TABLET | Freq: Two times a day (BID) | ORAL | Status: DC
  Administered 2017-03-03 – 2017-03-09 (×12): 1 via ORAL
  Filled 2017-03-01 (×13): qty 1

## 2017-03-01 MED ORDER — SODIUM CHLORIDE 0.9 % IV BOLUS (SEPSIS)
500.0000 mL | Freq: Once | INTRAVENOUS | Status: AC
  Administered 2017-03-01: 500 mL via INTRAVENOUS

## 2017-03-01 MED ORDER — INSULIN ASPART 100 UNIT/ML ~~LOC~~ SOLN
0.0000 [IU] | SUBCUTANEOUS | Status: DC
  Administered 2017-03-01: 2 [IU] via SUBCUTANEOUS
  Administered 2017-03-01 – 2017-03-02 (×2): 3 [IU] via SUBCUTANEOUS
  Administered 2017-03-02: 2 [IU] via SUBCUTANEOUS
  Administered 2017-03-02: 1 [IU] via SUBCUTANEOUS
  Administered 2017-03-02 – 2017-03-03 (×3): 3 [IU] via SUBCUTANEOUS
  Administered 2017-03-03 (×2): 2 [IU] via SUBCUTANEOUS
  Administered 2017-03-04: 3 [IU] via SUBCUTANEOUS
  Administered 2017-03-04: 2 [IU] via SUBCUTANEOUS
  Administered 2017-03-04: 3 [IU] via SUBCUTANEOUS
  Administered 2017-03-04: 5 [IU] via SUBCUTANEOUS
  Administered 2017-03-04 – 2017-03-05 (×4): 3 [IU] via SUBCUTANEOUS
  Administered 2017-03-05: 5 [IU] via SUBCUTANEOUS
  Administered 2017-03-05 – 2017-03-06 (×3): 3 [IU] via SUBCUTANEOUS
  Administered 2017-03-06 (×2): 5 [IU] via SUBCUTANEOUS
  Administered 2017-03-06: 2 [IU] via SUBCUTANEOUS
  Administered 2017-03-06: 3 [IU] via SUBCUTANEOUS
  Administered 2017-03-06: 5 [IU] via SUBCUTANEOUS
  Administered 2017-03-07: 3 [IU] via SUBCUTANEOUS
  Administered 2017-03-07: 5 [IU] via SUBCUTANEOUS
  Administered 2017-03-07: 3 [IU] via SUBCUTANEOUS
  Administered 2017-03-07: 8 [IU] via SUBCUTANEOUS
  Administered 2017-03-08: 2 [IU] via SUBCUTANEOUS
  Administered 2017-03-08: 5 [IU] via SUBCUTANEOUS
  Administered 2017-03-08: 8 [IU] via SUBCUTANEOUS
  Administered 2017-03-08 (×2): 3 [IU] via SUBCUTANEOUS
  Administered 2017-03-09: 5 [IU] via SUBCUTANEOUS
  Administered 2017-03-09: 3 [IU] via SUBCUTANEOUS
  Administered 2017-03-09: 5 [IU] via SUBCUTANEOUS
  Administered 2017-03-09 (×2): 2 [IU] via SUBCUTANEOUS
  Administered 2017-03-10 (×2): 3 [IU] via SUBCUTANEOUS
  Administered 2017-03-10 (×2): 5 [IU] via SUBCUTANEOUS
  Administered 2017-03-10 – 2017-03-11 (×3): 3 [IU] via SUBCUTANEOUS
  Administered 2017-03-11: 5 [IU] via SUBCUTANEOUS
  Administered 2017-03-11 (×3): 3 [IU] via SUBCUTANEOUS
  Administered 2017-03-11 – 2017-03-12 (×2): 5 [IU] via SUBCUTANEOUS
  Administered 2017-03-12: 3 [IU] via SUBCUTANEOUS
  Administered 2017-03-12: 2 [IU] via SUBCUTANEOUS
  Administered 2017-03-12: 3 [IU] via SUBCUTANEOUS
  Administered 2017-03-12: 5 [IU] via SUBCUTANEOUS
  Administered 2017-03-13 (×2): 2 [IU] via SUBCUTANEOUS
  Administered 2017-03-13: 3 [IU] via SUBCUTANEOUS
  Administered 2017-03-13: 5 [IU] via SUBCUTANEOUS
  Administered 2017-03-13: 3 [IU] via SUBCUTANEOUS
  Administered 2017-03-13: 2 [IU] via SUBCUTANEOUS
  Administered 2017-03-14 (×2): 5 [IU] via SUBCUTANEOUS
  Administered 2017-03-14: 2 [IU] via SUBCUTANEOUS
  Administered 2017-03-14 (×2): 3 [IU] via SUBCUTANEOUS
  Administered 2017-03-14: 8 [IU] via SUBCUTANEOUS
  Administered 2017-03-15: 2 [IU] via SUBCUTANEOUS
  Administered 2017-03-15: 11 [IU] via SUBCUTANEOUS

## 2017-03-01 MED ORDER — CLEVIDIPINE BUTYRATE 0.5 MG/ML IV EMUL
0.0000 mg/h | INTRAVENOUS | Status: DC
  Administered 2017-03-01: 10 mg/h via INTRAVENOUS
  Administered 2017-03-01: 21 mg/h via INTRAVENOUS
  Administered 2017-03-01: 5 mg/h via INTRAVENOUS
  Administered 2017-03-01: 10 mg/h via INTRAVENOUS
  Administered 2017-03-01: 8 mg/h via INTRAVENOUS
  Administered 2017-03-01: 1 mg/h via INTRAVENOUS
  Administered 2017-03-02: 12 mg/h via INTRAVENOUS
  Administered 2017-03-02: 4 mg/h via INTRAVENOUS
  Administered 2017-03-02: 8 mg/h via INTRAVENOUS
  Filled 2017-03-01 (×10): qty 50

## 2017-03-01 MED ORDER — STROKE: EARLY STAGES OF RECOVERY BOOK
Freq: Once | Status: AC
  Administered 2017-03-01: 15:00:00
  Filled 2017-03-01: qty 1

## 2017-03-01 MED ORDER — LABETALOL HCL 5 MG/ML IV SOLN
20.0000 mg | Freq: Once | INTRAVENOUS | Status: AC
  Administered 2017-03-02: 20 mg via INTRAVENOUS
  Filled 2017-03-01: qty 4

## 2017-03-01 NOTE — H&P (Signed)
H&P    Chief Complaint: ICH  History obtained from:  family  HPI:                                                                                                                                         Kristine Boyer is an 76 y.o. female with known dementia ahow was last seen at 1900 hours last night. This AM she was noted not to be talking and not moving her right side as well. 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 Currently she is weaker on the right and aphasic. She failed her swallow screen. She is on Eliquis for Afib.   Date last known well: Date: 02/28/2017 Time last known well: Time: 19:00 tPA Given: No: ICH   Intracerebral Hemorrhage (ICH) Score Total: 0  Past Medical History:  Diagnosis Date  . Abdominal pain 10/04/2013  . Adenomatous colon polyp   . Allergy   . Altered sensation, foot 04/26/2012  . Alzheimer disease   . Anemia   . Bipolar affective ([place])   . Blood transfusion without reported diagnosis   . BPPV (benign paroxysmal positional vertigo) 05/18/2011  . Cataract   . Chest pain 06/18/2016  . De Quervain's syndrome (tenosynovitis) 02/23/2011   Left wrist. Has worked w/ sports medicine and received steroid injection w/o relief.   . Diabetes mellitus   . Dysphagia 08/28/2013  . Dysuria 03/21/2014  . Flank pain 08/28/2013  . Foot pain, right 11/11/2010  . GERD (gastroesophageal reflux disease)   . Hyperlipidemia   . Hypertension   . Hypokalemia 03/18/2011   Takes potassium pills if develops muscle cramps. Eats lots of bananas. 11/2011   . Hypothyroidism   . Immune deficiency disorder ([place])   . Internal hemorrhoids   . Leg swelling 02/14/2013  . Mass of oropharynx 05/15/2014  . Musculoskeletal pain 08/16/2012  . Scalp pain 01/16/2015  . Sciatica 10/08/2014  . Seizures ([place])   . Stiffness of neck 05/16/2014    Past Surgical History:  Procedure Laterality Date  . ABDOMINAL HYSTERECTOMY    . TONSILECTOMY, ADENOIDECTOMY, BILATERAL  MYRINGOTOMY AND TUBES      Family History  Problem Relation Age of Onset  . Diabetes Mother   . CAD Mother   . Alcohol abuse Father   . Stomach cancer Sister   . Colon cancer Unknown        grandmother   Social History:  reports that she quit smoking about 5 years ago. She has never used smokeless tobacco. She reports that she does not drink alcohol or use drugs.  Allergies:  Allergies  Allergen Reactions  . Lactose Intolerance (Gi) Other (See Comments)    constipation  . Vicodin [Hydrocodone-Acetaminophen] Itching and Other (See Comments)    Dizzy and confused  . Tramadol Other (See Comments)    Distorted and confused  .  Neurontin [Gabapentin] Other (See Comments)    Drowsy. Makes her feel "funny"    Medications:                                                                                                                           Current Facility-Administered Medications  Medication Dose Route Frequency Provider Last Rate Last Dose  . 0.9 %  sodium chloride infusion  100 mL/hr Intravenous Continuous Pattricia Boss, MD 100 mL/hr at 03/10/2017 1003 100 mL/hr at 03/24/2017 1003  . clevidipine (CLEVIPREX) infusion 0.5 mg/mL  0-21 mg/hr Intravenous Continuous Pattricia Boss, MD 16 mL/hr at 03/24/2017 1126 8 mg/hr at 03/11/2017 1126   Current Outpatient Prescriptions  Medication Sig Dispense Refill  . acetaminophen (TYLENOL) 325 MG tablet Take 650 mg by mouth every 6 (six) hours as needed for mild pain.    [name] atorvastatin (LIPITOR) 40 MG tablet Take 1 tablet (40 mg total) by mouth daily. 90 tablet 3  . ELIQUIS 5 MG TABS tablet TAKE 1 TABLET (5 MG TOTAL) BY MOUTH 2 TIMES DAILY. 60 tablet 5  . glipiZIDE (GLUCOTROL) 5 MG tablet Take 5 mg by mouth daily before breakfast.    . guaiFENesin-dextromethorphan (ROBITUSSIN DM) 100-10 MG/5ML syrup Take 5 mLs by mouth every 4 (four) hours as needed for cough. 118 mL 0  . levothyroxine (SYNTHROID, LEVOTHROID) 50 MCG tablet TAKE 1 TABLET (50 MCG TOTAL)  BY MOUTH DAILY. 90 tablet 0  . magnesium oxide (MAG-OX) 400 (241.3 Mg) MG tablet Take 1 tablet (400 mg total) by mouth 2 (two) times daily. (Patient taking differently: Take 400 mg by mouth daily. ) 60 tablet 3  . Melatonin 10 MG TABS Take 10 mg by mouth at bedtime.    . metoprolol (TOPROL-XL) 200 MG 24 hr tablet Take 1 tablet (200 mg total) by mouth daily. 90 tablet 3  . neomycin-bacitracin-polymyxin (NEOSPORIN) [phone] ointment Apply 1 application topically daily. Inner thigh    . QUEtiapine (SEROQUEL) 25 MG tablet Take 1 tablet (25 mg total) by mouth at bedtime. 30 tablet 0  . traZODone (DESYREL) 50 MG tablet Take 0.5-1 tablets (25-50 mg total) by mouth at bedtime as needed for sleep. (Patient taking differently: Take 50 mg by mouth at bedtime. ) 30 tablet 3  . valsartan (DIOVAN) 320 MG tablet TAKE 1 TABLET (320 MG TOTAL) BY MOUTH DAILY. 90 tablet 0  . metoprolol (TOPROL-XL) 200 MG 24 hr tablet TAKE 1 TABLET (200 MG TOTAL) BY MOUTH DAILY. (Patient not taking: Reported on 03/07/2017) 30 tablet 0  . oxyCODONE (OXY IR/ROXICODONE) 5 MG immediate release tablet Take 0.5-1 tablets (2.5-5 mg total) by mouth every 6 (six) hours as needed for severe pain. (Patient not taking: Reported on 03/24/2017) 45 tablet 0  . polyethylene glycol powder (GLYCOLAX/MIRALAX) powder Take 17 g by mouth daily. (Patient taking differently: Take 17 g by mouth daily as needed for mild constipation. ) 500 g 1  . senna (SENOKOT) 8.6 MG tablet Take  2 tablets (17.2 mg total) by mouth 2 (two) times daily. Once achieve daily BM decrease to one tablet daily (Patient taking differently: Take 2 tablets by mouth 2 (two) times daily as needed for constipation. Once achieve daily BM decrease to one tablet daily) 30 tablet 0     ROS:                                                                                                                                       History obtained from unobtainable from patient due to  aphasia  s  Neurologic Examination:                                                                                                      Blood pressure (!) 187/110, pulse 84, temperature 97.9 F (36.6 C), temperature source Rectal, resp. rate (!) 23, SpO2 96 %.  HEENT-  Normocephalic, no lesions, without obvious abnormality.  Normal external eye and conjunctiva.  Normal TM's bilaterally.  Normal auditory canals and external ears. Normal external nose, mucus membranes and septum.  Normal pharynx. Cardiovascular- irregularly irregular rhythm, pulses palpable throughout   Lungs- chest clear, no wheezing, rales, normal symmetric air entry, Heart exam - S1, S2 normal, no murmur, no gallop, rate regular Abdomen- normal findings: bowel sounds normal Extremities- no edema Lymph-no adenopathy palpable Musculoskeletal-no joint tenderness, deformity or swelling Skin-warm and dry, no hyperpigmentation, vitiligo, or suspicious lesions  Neurological Examination Mental Status: Aphasic and follows no commands Cranial Nerves: II: blinks to threat bilaterally  III,IV, VI:  extra-ocular motions intact bilaterally, pupils equal, round, reactive to light and accommodation V,VII: smile asymmetric on the left due to adentulus, facial light touch sensation normal bilaterally VIII: hearing normal bilaterally IX,X: uvula rises symmetrically XI: bilateral shoulder shrug XII: midline tongue extension Motor: Left arm and leg 5/5 and right arm and leg 4-/5 Sensory: Pinprick and light touch intact but unable to get details due to aphasia Deep Tendon Reflexes: 2+ and symmetric throughout Plantars: Right: up going   Left: downgoing Cerebellar: Not able to test Gait: not tested       Lab Results: Basic Metabolic Panel:  Recent Labs Lab 03/23/2017 0954 03/04/2017 1005  NA 139 141  K 3.7 3.7  CL 103 101  CO2 28  --   GLUCOSE 219* 224*  BUN 8 8  CREATININE 0.97 0.90  CALCIUM 8.9  --     Liver  Function Tests:  Recent Labs Lab 03/25/2017 0954  AST 33  ALT 21  ALKPHOS 93  BILITOT 0.5  PROT 7.3  ALBUMIN 3.6   No results for input(s): LIPASE, AMYLASE in the last 168 hours. No results for input(s): AMMONIA in the last 168 hours.  CBC:  Recent Labs Lab 02/28/2017 0954 03/17/2017 1005  WBC 7.8  --   NEUTROABS 5.4  --   HGB 10.4* 10.9*  HCT 32.4* 32.0*  MCV 81.6  --   PLT 174  --     Cardiac Enzymes: No results for input(s): CKTOTAL, CKMB, CKMBINDEX, TROPONINI in the last 168 hours.  Lipid Panel: No results for input(s): CHOL, TRIG, HDL, CHOLHDL, VLDL, LDLCALC in the last 168 hours.  CBG: No results for input(s): GLUCAP in the last 168 hours.  Microbiology: Results for orders placed or performed during the hospital encounter of 02/08/17  Urine culture     Status: Abnormal   Collection Time: 02/08/17  3:23 PM  Result Value Ref Range Status   Specimen Description URINE, CATHETERIZED  Final   Special Requests NONE  Final   Culture MULTIPLE SPECIES PRESENT, SUGGEST RECOLLECTION (A)  Final   Report Status 02/10/2017 FINAL  Final    Coagulation Studies:  Recent Labs  03/07/2017 0954  LABPROT 14.5  INR 1.12    Imaging: Ct Head Wo Contrast  Result Date: 02/26/2017 CLINICAL DATA:  Right-sided weakness and slurred speech technique and 14 hours ago. EXAM: CT HEAD WITHOUT CONTRAST TECHNIQUE: Contiguous axial images were obtained from the base of the skull through the vertex without intravenous contrast. COMPARISON:  02/08/2017 FINDINGS: Brain: 2 x 2 x 1.5 cm hematoma in the left thalamus with mild surrounding edema and local mass effect on the third ventricle. There is moderate generalized atrophy and chronic small vessel ischemic gliosis in the cerebral white matter. Small remote left cerebellar infarct. No acute cortical infarct noted. No extra-axial collection. Vascular: Atherosclerotic calcification. Skull: Negative Sinuses/Orbits: Bilateral cataract resection.  No  acute finding Other: Critical Value/emergent results were called by telephone at the time of interpretation on 03/11/2017 at 10:47 am to Dr. Pattricia Boss , who verbally acknowledged these results. IMPRESSION: 2 cm left thalamic hematoma (~3 cc). Associated edema and local mild mass-effect. Electronically Signed   By: Monte Fantasia M.D.   On: 03/27/2017 10:49       Assessment and plan discussed with with attending physician and they are in agreement.    Etta Quill PA-C [facility] [phone]  02/28/2017, 11:33 AM   Assessment: 76 y.o. 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   Stroke Risk Factors - atrial fibrillation, diabetes mellitus, hyperlipidemia and hypertension  1. HgbA1c, fasting lipid panel 2. MRI, MRA  of the brain without contrast 3. PT consult, OT consult, Speech consult 4. Echocardiogram 5. Carotid dopplers 6. Prophylactic therapy-None 7. Risk factor modification 8. Telemetry monitoring 9. Frequent neuro checks 10 NPO until passes stroke swallow screen 11 please page stroke NP  Or  PA  Or MD from 8am -4 pm  as this patient from this time will be  followed by the stroke.   You can look them up on [email or web address]  Password [email or web address]

## 2017-03-01 NOTE — Progress Notes (Signed)
PT Cancellation Note  Patient Details Name: Kristine Boyer MRN: [id number] DOB: 1941-06-11   Cancelled Treatment:    Reason Eval/Treat Not Completed: Patient not medically ready, patient on bedrest at this time. Will attempt in am   [name] [name] 03/08/2017, 2:54 PM

## 2017-03-01 NOTE — Progress Notes (Signed)
Upon transfer to [facility], Cleviprex was charted as 21 mg/hr, but was programmed (and infusing at 10 mg/hr).  BP controlled at this rate, Dr. Shon Hale notified. Cardene not started as BP is controlled on Cleviprex adequately.

## 2017-03-01 NOTE — Progress Notes (Signed)
OT Cancellation Note  Patient Details Name: ARRIYANNA MERSCH MRN: [id number] DOB: [birth year]   Cancelled Treatment:    Reason Eval/Treat Not Completed: Patient not medically ready;Medical issues which prohibited therapy. Pt with active bedrest orders. Will await increase in activity orders prior to initiating OT evaluation. Thank you for this referral!  Norman Herrlich, MS OTR/L  Pager: [phone] 02/28/2017, 2:25 PM

## 2017-03-01 NOTE — ED Triage Notes (Addendum)
Pt reports to the ED for eval of AMS. Pts daughter reports she has not been normal. Daughter states that she had to wake her up this am at 7 am which is abnormal and she has some aphasia, difficulty walking, right facial droop, and right sided arm drift. LSN: last night around 19:00-20:00. She was complaining of a HA yesterday. She has hx of TIAs. She also is incontinent of urine and has frequent UTIs. Family also note a strong odor to urine. She was recently tx for a UTI and completed a course of abx approx 2 weeks ago. 12 lead unremarkable. CBG 185 mg/dl. She was hypertensive en route at 172/98. She has hx of HTN and has not taken her meds. She has hx of dementia so she is altered at baseline.

## 2017-03-01 NOTE — ED Provider Notes (Signed)
[facility]-EMERGENCY DEPT Provider Note   CSN: [id number] Arrival date & time: 03/06/2017  0903     History   Chief Complaint Chief Complaint  Patient presents with  . Altered Mental Status    HPI Kristine Boyer is a 76 y.o. female.  HPI This is a 77 year old female with a history of dementia who presents today with right-sided weakness. Her daughter is present with her and gives the history. The daughter states that she alternates 2 week periods with her brother attending to the mother. She has been with her mother over the past week. She has been in her usual state. She states that normally she gets up and is interactive with other members of the family. She went to bed last night at about 7 PM.  Past Medical History:  Diagnosis Date  . Abdominal pain 10/04/2013  . Adenomatous colon polyp   . Allergy   . Altered sensation, foot 04/26/2012  . Alzheimer disease   . Anemia   . Bipolar affective (HCC)   . Blood transfusion without reported diagnosis   . BPPV (benign paroxysmal positional vertigo) 05/18/2011  . Cataract   . Chest pain 06/18/2016  . De Quervain's syndrome (tenosynovitis) 02/23/2011   Left wrist. Has worked w/ sports medicine and received steroid injection w/o relief.   . Diabetes mellitus   . Dysphagia 08/28/2013  . Dysuria 03/21/2014  . Flank pain 08/28/2013  . Foot pain, right 11/11/2010  . GERD (gastroesophageal reflux disease)   . Hyperlipidemia   . Hypertension   . Hypokalemia 03/18/2011   Takes potassium pills if develops muscle cramps. Eats lots of bananas. 11/2011   . Hypothyroidism   . Immune deficiency disorder (HCC)   . Internal hemorrhoids   . Leg swelling 02/14/2013  . Mass of oropharynx 05/15/2014  . Musculoskeletal pain 08/16/2012  . Scalp pain 01/16/2015  . Sciatica 10/08/2014  . Seizures (HCC)   . Stiffness of neck 05/16/2014    Patient Active Problem List   Diagnosis Date Noted  . Wound of skin 02/25/2017  . Unresponsive episode 02/09/2017  . Nausea &  vomiting 02/09/2017  . Acute renal insufficiency 02/09/2017  . Air embolism (HCC) 02/09/2017  . Pyuria 02/09/2017  . Subcutaneous emphysema (HCC) 02/08/2017  . Insomnia 12/17/2016  . Nonspecific chest pain   . Atypical atrial flutter (HCC)   . Essential hypertension   . Chest pain 12/02/2016  . Incontinence 11/02/2016  . Hearing impairment, Left Ear 11/02/2016  . Dementia with behavioral disturbance 11/02/2016  . Atrial fibrillation (HCC) 06/18/2016  . Angina pectoris, unspecified 08/05/2015  . CN (constipation) 12/17/2014  . Dysphagia 08/28/2013  . Unspecified constipation 07/28/2013  . Forgetfulness 07/18/2013  . Breast nodule 01/03/2013  . Lung mass 08/05/2011  . OSA (obstructive sleep apnea) 06/12/2011  . Bulging disc 06/12/2011  . BPPV (benign paroxysmal positional vertigo) 05/18/2011  . BACK PAIN, LUMBAR 11/28/2009  . OBESITY 09/11/2008  . Allergic rhinitis 01/19/2008  . Hypothyroidism 11/25/2006  . Diabetes mellitus type 2, uncontrolled (HCC) 11/25/2006  . Hyperlipidemia 11/25/2006  . HYPERTENSION, BENIGN SYSTEMIC 11/25/2006  . GASTROESOPHAGEAL REFLUX, NO ESOPHAGITIS 11/25/2006  . OSTEOARTHRITIS, MULTI SITES 11/25/2006    Past Surgical History:  Procedure Laterality Date  . ABDOMINAL HYSTERECTOMY    . TONSILECTOMY, ADENOIDECTOMY, BILATERAL MYRINGOTOMY AND TUBES      OB History    No data available       Home Medications    Prior to Admission medications   Medication Sig Start  Date End Date Taking? Authorizing Provider  ACCU-CHEK FASTCLIX LANCETS MISC 1 each by Does not apply route daily before breakfast. 05/23/15   Narda Bonds, MD  atorvastatin (LIPITOR) 40 MG tablet Take 1 tablet (40 mg total) by mouth daily. 06/22/16   Arvilla Market, DO  Blood Glucose Monitoring Suppl (ACCU-CHEK AVIVA PLUS) W/DEVICE KIT Please check your blood sugar once per day when you wake up. 05/23/15   Narda Bonds, MD  ELIQUIS 5 MG TABS tablet TAKE 1 TABLET (5 MG  TOTAL) BY MOUTH 2 TIMES DAILY. 08/24/16   Arvilla Market, DO  glucose blood test strip 1 each by Other route daily before breakfast. 05/23/15   Narda Bonds, MD  guaiFENesin-dextromethorphan (ROBITUSSIN DM) 100-10 MG/5ML syrup Take 5 mLs by mouth every 4 (four) hours as needed for cough. 02/09/17   Katharina Caper, MD  levothyroxine (SYNTHROID, LEVOTHROID) 50 MCG tablet TAKE 1 TABLET (50 MCG TOTAL) BY MOUTH DAILY. 12/16/16   Arvilla Market, DO  magnesium oxide (MAG-OX) 400 (241.3 Mg) MG tablet Take 1 tablet (400 mg total) by mouth 2 (two) times daily. 02/10/17   Katharina Caper, MD  Melatonin 10 MG TABS Take 10 mg by mouth at bedtime.    [provider]  metoprolol (TOPROL-XL) 200 MG 24 hr tablet Take 1 tablet (200 mg total) by mouth daily. 02/17/17   Arvilla Market, DO  metoprolol (TOPROL-XL) 200 MG 24 hr tablet TAKE 1 TABLET (200 MG TOTAL) BY MOUTH DAILY. 02/24/17   Arvilla Market, DO  oxyCODONE (OXY IR/ROXICODONE) 5 MG immediate release tablet Take 0.5-1 tablets (2.5-5 mg total) by mouth every 6 (six) hours as needed for severe pain. 02/11/17   McDiarmid, Leighton Roach, MD  polyethylene glycol powder (GLYCOLAX/MIRALAX) powder Take 17 g by mouth daily. Patient taking differently: Take 17 g by mouth daily as needed for mild constipation.  12/17/14   Narda Bonds, MD  QUEtiapine (SEROQUEL) 25 MG tablet Take 1 tablet (25 mg total) by mouth at bedtime. 02/11/17   McDiarmid, Leighton Roach, MD  senna (SENOKOT) 8.6 MG tablet Take 2 tablets (17.2 mg total) by mouth 2 (two) times daily. Once achieve daily BM decrease to one tablet daily Patient taking differently: Take 2 tablets by mouth daily as needed for constipation.  10/24/13   Ozella Rocks, MD  traZODone (DESYREL) 50 MG tablet Take 0.5-1 tablets (25-50 mg total) by mouth at bedtime as needed for sleep. 12/16/16   Arvilla Market, DO  valsartan (DIOVAN) 320 MG tablet TAKE 1 TABLET (320 MG TOTAL) BY MOUTH  DAILY. 11/10/16   Arvilla Market, DO    Family History Family History  Problem Relation Age of Onset  . Diabetes Mother   . CAD Mother   . Alcohol abuse Father   . Stomach cancer Sister   . Colon cancer Unknown        grandmother    Social History Social History  Substance Use Topics  . Smoking status: Former Smoker    Quit date: 10/13/2011  . Smokeless tobacco: Never Used  . Alcohol use No     Allergies   Lactose intolerance (gi); Vicodin [hydrocodone-acetaminophen]; Tramadol; and Neurontin [gabapentin]   Review of Systems Review of Systems  Unable to perform ROS: Mental status change     Physical Exam Updated Vital Signs BP (!) 173/97   Pulse 72   Temp 97.9 F (36.6 C) (Rectal)   Resp 20   SpO2 100%  Physical Exam  Constitutional: She appears well-developed and well-nourished. No distress.  HENT:  Head: Normocephalic and atraumatic.  Right Ear: External ear normal.  Left Ear: External ear normal.  Nose: Nose normal.  Eyes: Conjunctivae and EOM are normal. Pupils are equal, round, and reactive to light.  Neck: Normal range of motion. Neck supple.  Cardiovascular: Normal rate and regular rhythm.   Pulmonary/Chest: Effort normal.  Abdominal: Soft. Bowel sounds are normal.  Musculoskeletal: Normal range of motion.  Neurological: She is alert. She exhibits normal muscle tone. Coordination normal.  Aphasia Right ue and le unable to move against gravity, increase patella and br from left. Right facial asymmetry  Skin: Skin is warm and dry. Capillary refill takes less than 2 seconds.  Psychiatric: She has a normal mood and affect. Her behavior is normal. Thought content normal.  Nursing note and vitals reviewed.    ED Treatments / Results  Labs (all labs ordered are listed, but only abnormal results are displayed) Labs Reviewed  CBC - Abnormal; Notable for the following:       Result Value   Hemoglobin 10.4 (*)    HCT 32.4 (*)    All other  components within normal limits  COMPREHENSIVE METABOLIC PANEL - Abnormal; Notable for the following:    Glucose, Bld 219 (*)    GFR calc non Af Amer 56 (*)    All other components within normal limits  URINALYSIS, ROUTINE W REFLEX MICROSCOPIC - Abnormal; Notable for the following:    Color, Urine STRAW (*)    Glucose, UA >=500 (*)    All other components within normal limits  I-STAT CHEM 8, ED - Abnormal; Notable for the following:    Glucose, Bld 224 (*)    Calcium, Ion 1.14 (*)    Hemoglobin 10.9 (*)    HCT 32.0 (*)    All other components within normal limits  ETHANOL  PROTIME-INR  APTT  DIFFERENTIAL  RAPID URINE DRUG SCREEN, HOSP PERFORMED  I-STAT TROPOININ, ED    EKG  EKG Interpretation  Date/Time:  Monday March 01 2017 09:20:37 EDT Ventricular Rate:  76 PR Interval:    QRS Duration: 95 QT Interval:  418 QTC Calculation: 470 R Axis:   -21 Text Interpretation:  Sinus rhythm Prolonged PR interval Borderline left axis deviation Borderline T wave abnormalities Confirmed by Maxyne Derocher MD, Kateleen Encarnacion ([id number]) on 03/05/2017 10:53:45 AM       Radiology Ct Head Wo Contrast  Result Date: 03/20/2017 CLINICAL DATA:  Right-sided weakness and slurred speech technique and 14 hours ago. EXAM: CT HEAD WITHOUT CONTRAST TECHNIQUE: Contiguous axial images were obtained from the base of the skull through the vertex without intravenous contrast. COMPARISON:  02/08/2017 FINDINGS: Brain: 2 x 2 x 1.5 cm hematoma in the left thalamus with mild surrounding edema and local mass effect on the third ventricle. There is moderate generalized atrophy and chronic small vessel ischemic gliosis in the cerebral white matter. Small remote left cerebellar infarct. No acute cortical infarct noted. No extra-axial collection. Vascular: Atherosclerotic calcification. Skull: Negative Sinuses/Orbits: Bilateral cataract resection.  No acute finding Other: Critical Value/emergent results were called by telephone at the time of  interpretation on 03/09/2017 at 10:47 am to Dr. Margarita Grizzle , who verbally acknowledged these results. IMPRESSION: 2 cm left thalamic hematoma (~3 cc). Associated edema and local mild mass-effect. Electronically Signed   By: Marnee Spring M.D.   On: 03/12/2017 10:49    Procedures Procedures (including critical care time)  Medications  Ordered in ED Medications  sodium chloride 0.9 % bolus 500 mL (500 mLs Intravenous New Bag/Given 03-14-2017 1003)    Followed by  0.9 %  sodium chloride infusion (100 mL/hr Intravenous New Bag/Given March 14, 2017 1003)     Initial Impression / Assessment and Plan / ED Course  I have reviewed the triage vital signs and the nursing notes.  Pertinent labs & imaging results that were available during my care of the patient were reviewed by me and considered in my medical decision making (see chart for details).     CT results called by Dr. Grace Isaac. Cleviprex ordered for bp control Discussed with Dr. Roxy Manns Discussed results with family. Patient continues with increased bp pending cleviprex.  Sleeping- awakens easily, right sided deficit continues.  CRITICAL CARE Performed by: Hilario Quarry Total critical care time: 30 minutes Critical care time was exclusive of separately billable procedures and treating other patients. Critical care was necessary to treat or prevent imminent or life-threatening deterioration. Critical care was time spent personally by me on the following activities: development of treatment plan with patient and/or surrogate as well as nursing, discussions with consultants, evaluation of patient's response to treatment, examination of patient, obtaining history from patient or surrogate, ordering and performing treatments and interventions, ordering and review of laboratory studies, ordering and review of radiographic studies, pulse oximetry and re-evaluation of patient's condition.  Final Clinical Impressions(s) / ED Diagnoses   Final diagnoses:    Nontraumatic hemorrhage of left cerebral hemisphere [facility])  Hypertension, unspecified type    New Prescriptions New Prescriptions   No medications on file     Margarita Grizzle, MD 2017/03/14 1324

## 2017-03-02 ENCOUNTER — Inpatient Hospital Stay (HOSPITAL_COMMUNITY): Payer: Medicare HMO

## 2017-03-02 DIAGNOSIS — I6789 Other cerebrovascular disease: Secondary | ICD-10-CM

## 2017-03-02 DIAGNOSIS — Z7901 Long term (current) use of anticoagulants: Secondary | ICD-10-CM

## 2017-03-02 DIAGNOSIS — I161 Hypertensive emergency: Secondary | ICD-10-CM

## 2017-03-02 DIAGNOSIS — E785 Hyperlipidemia, unspecified: Secondary | ICD-10-CM

## 2017-03-02 DIAGNOSIS — E1159 Type 2 diabetes mellitus with other circulatory complications: Secondary | ICD-10-CM

## 2017-03-02 LAB — COMPREHENSIVE METABOLIC PANEL
ALBUMIN: 3.7 g/dL (ref 3.5–5.0)
ALK PHOS: 88 U/L (ref 38–126)
ALT: 20 U/L (ref 14–54)
ANION GAP: 10 (ref 5–15)
AST: 31 U/L (ref 15–41)
BILIRUBIN TOTAL: 0.9 mg/dL (ref 0.3–1.2)
BUN: 5 mg/dL — AB (ref 6–20)
CALCIUM: 8.8 mg/dL — AB (ref 8.9–10.3)
CO2: 25 mmol/L (ref 22–32)
Chloride: 105 mmol/L (ref 101–111)
Creatinine, Ser: 0.86 mg/dL (ref 0.44–1.00)
GFR calc Af Amer: >60 mL/min (ref >60)
GFR calc non Af Amer: >60 mL/min (ref >60)
GLUCOSE: 143 mg/dL — AB (ref 65–99)
Potassium: 3.3 mmol/L — ABNORMAL LOW (ref 3.5–5.1)
SODIUM: 140 mmol/L (ref 135–145)
TOTAL PROTEIN: 7.3 g/dL (ref 6.5–8.1)

## 2017-03-02 LAB — CBC
HEMATOCRIT: 35.4 % — AB (ref 36.0–46.0)
HEMOGLOBIN: 11.6 g/dL — AB (ref 12.0–15.0)
MCH: 26.4 pg (ref 26.0–34.0)
MCHC: 32.8 g/dL (ref 30.0–36.0)
MCV: 80.5 fL (ref 78.0–100.0)
Platelets: 165 10*3/uL (ref 150–400)
RBC: 4.4 MIL/uL (ref 3.87–5.11)
RDW: 13 % (ref 11.5–15.5)
WBC: 9.7 10*3/uL (ref 4.0–10.5)

## 2017-03-02 LAB — GLUCOSE, CAPILLARY
GLUCOSE-CAPILLARY: 175 mg/dL — AB (ref 65–99)
GLUCOSE-CAPILLARY: 160 mg/dL — AB (ref 65–99)
Glucose-Capillary: 166 mg/dL — ABNORMAL HIGH (ref 65–99)
Glucose-Capillary: 102 mg/dL — ABNORMAL HIGH (ref 65–99)
Glucose-Capillary: 122 mg/dL — ABNORMAL HIGH (ref 65–99)
Glucose-Capillary: 123 mg/dL — ABNORMAL HIGH (ref 65–99)

## 2017-03-02 LAB — TRIGLYCERIDES: TRIGLYCERIDES: 46 mg/dL (ref <150)

## 2017-03-02 MED ORDER — ORAL CARE MOUTH RINSE
15.0000 mL | Freq: Two times a day (BID) | OROMUCOSAL | Status: DC
  Administered 2017-03-02 – 2017-03-08 (×12): 15 mL via OROMUCOSAL

## 2017-03-02 MED ORDER — CLEVIDIPINE BUTYRATE 0.5 MG/ML IV EMUL: 0.0000 mg/h | INTRAVENOUS | Status: DC

## 2017-03-02 MED ORDER — CLEVIDIPINE BUTYRATE 0.5 MG/ML IV EMUL
0.0000 mg/h | INTRAVENOUS | Status: DC
  Filled 2017-03-02 (×2): qty 50

## 2017-03-02 NOTE — Evaluation (Signed)
Clinical/Bedside Swallow Evaluation Patient Details  Name: MARTYNA THORNS MRN: [id number] Date of Birth: 09/18/1941  Today's Date: 03/02/2017 Time: SLP Start Time (ACUTE ONLY): 2992 SLP Stop Time (ACUTE ONLY): 0957 SLP Time Calculation (min) (ACUTE ONLY): 20 min  Past Medical History:  Past Medical History:  Diagnosis Date  . Abdominal pain 10/04/2013  . Adenomatous colon polyp   . Allergy   . Altered sensation, foot 04/26/2012  . Alzheimer disease   . Anemia   . Bipolar affective ([place])   . Blood transfusion without reported diagnosis   . BPPV (benign paroxysmal positional vertigo) 05/18/2011  . Cataract   . Chest pain 06/18/2016  . De Quervain's syndrome (tenosynovitis) 02/23/2011   Left wrist. Has worked w/ sports medicine and received steroid injection w/o relief.   . Diabetes mellitus   . Dysphagia 08/28/2013  . Dysuria 03/21/2014  . Flank pain 08/28/2013  . Foot pain, right 11/11/2010  . GERD (gastroesophageal reflux disease)   . Hyperlipidemia   . Hypertension   . Hypokalemia 03/18/2011   Takes potassium pills if develops muscle cramps. Eats lots of bananas. 11/2011   . Hypothyroidism   . Immune deficiency disorder ([place])   . Internal hemorrhoids   . Leg swelling 02/14/2013  . Mass of oropharynx 05/15/2014  . Musculoskeletal pain 08/16/2012  . Scalp pain 01/16/2015  . Sciatica 10/08/2014  . Seizures ([place])   . Stiffness of neck 05/16/2014   Past Surgical History:  Past Surgical History:  Procedure Laterality Date  . ABDOMINAL HYSTERECTOMY    . TONSILECTOMY, ADENOIDECTOMY, BILATERAL MYRINGOTOMY AND TUBES     HPI:  Pt is a 76 yo female admitted with AMS, right sided weakness, and difficulty speaking. 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 It also revealed innumerable scattered foci of chronic hemorrhages, remote R thalamic and L cerebellar infarcts, and generalized cerebral atrophy. PMH includes  dementia, seizures, mass of oropharynx (2015), GERD, dysphagia, DM, BPPV, bipolar affective. 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   Assessment / Plan / Recommendation Clinical Impression  Pt is too lethargic to participate in PO trials at this time. She does arouse easily and can follow a command with visual cueing, but her sustained alertness/attention only lasts for a few seconds at best before she starts to fall back asleep. SLP provided stimulation to pt's lips via ice chip with no attempted acceptance, but with minimal awareness as evidenced by pt licking her lips after ice chip was removed. Given the above as well as her h/o mild dysphagia secondary to severe cervical osteophytes, her aspiration risk is high at this time. Various risk factors were discussed with her daughter, who helps care for her at home. Would recommend that she remain NPO today but with SLP f/u on next date to assess for improved alertness and readiness for likely MBS. SLP Visit Diagnosis: Dysphagia, unspecified (R13.10)    Aspiration Risk  Severe aspiration risk    Diet Recommendation NPO   Medication Administration: Via alternative means    Other  Recommendations Oral Care Recommendations: Oral care QID Other Recommendations: Have oral suction available   Follow up Recommendations  (tba)      Frequency and Duration  min 2x/week  2 weeks       Prognosis Prognosis for Safe Diet Advancement: Fair Barriers to Reach Goals: Cognitive deficits;Language deficits;Other (Comment) (severe cervical osteophytes)      Swallow Study   General HPI: Pt is a 76 yo female admitted with AMS, right sided weakness, and difficulty speaking. Pt was found to have an acute L thalamic ICH with associated  vasogenic edema with localized 3 mm of L to R midline shift. It also revealed innumerable scattered foci of chronic hemorrhages, remote R thalamic and L cerebellar infarcts, and generalized cerebral atrophy. PMH includes dementia, seizures, mass of oropharynx (2015), GERD, dysphagia, DM, BPPV, bipolar affective. 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 Type of Study: Bedside Swallow Evaluation Previous Swallow Assessment: none in chart - see HPI Diet Prior to this Study: NPO Temperature Spikes Noted: No Respiratory Status: Room air History of Recent Intubation: No Behavior/Cognition: Lethargic/Drowsy;Requires cueing Oral Cavity Assessment:  (difficult to assess, does not open her mouth much) Oral Care Completed by SLP: Yes Oral Cavity - Dentition: Missing dentition Vision:  (mostly keeps eyes closed) Self-Feeding Abilities: Total assist Patient Positioning: Upright in bed Baseline Vocal Quality: Low vocal intensity    Oral/Motor/Sensory Function Overall Oral Motor/Sensory Function:  (difficulty following commands to assess)   Ice Chips Ice chips: Impaired Presentation: Spoon Oral Phase Impairments: Poor awareness of bolus;Other (comment) (no acceptance)   Thin Liquid Thin Liquid: Not tested    Nectar Thick Nectar Thick Liquid: Not tested   Honey Thick Honey Thick Liquid: Not tested   Puree Puree: Not tested   Solid   GO   Solid: Not tested        Germain Osgood 03/02/2017,10:23 AM   Germain Osgood, M.A. CCC-SLP [phone]

## 2017-03-02 NOTE — Progress Notes (Signed)
RT placed patient on CPAP HS. 4L O2 bleed in needed. Patient tolerating well at this time.  

## 2017-03-02 NOTE — Progress Notes (Signed)
PT Cancellation Note  Patient Details Name: Kristine Boyer MRN: [id number] DOB: 06-08-1941   Cancelled Treatment:    Reason Eval/Treat Not Completed: Patient not medically ready Patient remains on bedrest per orders.  **MD: Please write activity orders when appropriate for patient.  Thank you.  Despina Pole 03/02/2017, 11:35 AM Carita Pian. Sanjuana Kava, [place] Pager [phone]

## 2017-03-02 NOTE — Progress Notes (Signed)
OT Cancellation Note  Patient Details Name: Kristine Boyer MRN: [id number] DOB: 1941-06-06   Cancelled Treatment:    Reason Eval/Treat Not Completed: Patient not medically ready. Pt remains on bedrest per orders. Will await increase in activity orders prior to initiating OT evaluation.   Norman Herrlich, MS OTR/L  Pager: [phone]   Norman Herrlich 03/02/2017, 12:14 PM

## 2017-03-02 NOTE — Evaluation (Signed)
Speech Language Pathology Evaluation Patient Details Name: Kristine Boyer MRN: [id number] DOB: 05-Jul-1941 Today's Date: 03/02/2017 Time: 5784-6962 SLP Time Calculation (min) (ACUTE ONLY): 19 min  Problem List:  Patient Active Problem List   Diagnosis Date Noted  . ICH (intracerebral hemorrhage) ([place]) 03/14/2017  . Wound of skin 02/25/2017  . Unresponsive episode 02/09/2017  . Nausea & vomiting 02/09/2017  . Acute renal insufficiency 02/09/2017  . Air embolism ([place]) 02/09/2017  . Pyuria 02/09/2017  . Subcutaneous emphysema ([place]) 02/08/2017  . Insomnia 12/17/2016  . Nonspecific chest pain   . Atypical atrial flutter ([place])   . Essential hypertension   . Chest pain 12/02/2016  . Incontinence 11/02/2016  . Hearing impairment, Left Ear 11/02/2016  . Dementia with behavioral disturbance 11/02/2016  . Atrial fibrillation ([place]) 06/18/2016  . Angina pectoris, unspecified 08/05/2015  . CN (constipation) 12/17/2014  . Dysphagia 08/28/2013  . Unspecified constipation 07/28/2013  . Forgetfulness 07/18/2013  . Breast nodule 01/03/2013  . Lung mass 08/05/2011  . OSA (obstructive sleep apnea) 06/12/2011  . Bulging disc 06/12/2011  . BPPV (benign paroxysmal positional vertigo) 05/18/2011  . BACK PAIN, LUMBAR 11/28/2009  . OBESITY 09/11/2008  . Allergic rhinitis 01/19/2008  . Hypothyroidism 11/25/2006  . Diabetes mellitus type 2, uncontrolled ([place]) 11/25/2006  . Hyperlipidemia 11/25/2006  . HYPERTENSION, BENIGN SYSTEMIC 11/25/2006  . GASTROESOPHAGEAL REFLUX, NO ESOPHAGITIS 11/25/2006  . OSTEOARTHRITIS, MULTI SITES 11/25/2006   Past Medical History:  Past Medical History:  Diagnosis Date  . Abdominal pain 10/04/2013  . Adenomatous colon polyp   . Allergy   . Altered sensation, foot 04/26/2012  . Alzheimer disease   . Anemia   . Bipolar affective ([place])   . Blood transfusion without reported diagnosis   . BPPV (benign paroxysmal positional vertigo) 05/18/2011  . Cataract   . Chest  pain 06/18/2016  . De Quervain's syndrome (tenosynovitis) 02/23/2011   Left wrist. Has worked w/ sports medicine and received steroid injection w/o relief.   . Diabetes mellitus   . Dysphagia 08/28/2013  . Dysuria 03/21/2014  . Flank pain 08/28/2013  . Foot pain, right 11/11/2010  . GERD (gastroesophageal reflux disease)   . Hyperlipidemia   . Hypertension   . Hypokalemia 03/18/2011   Takes potassium pills if develops muscle cramps. Eats lots of bananas. 11/2011   . Hypothyroidism   . Immune deficiency disorder ([place])   . Internal hemorrhoids   . Leg swelling 02/14/2013  . Mass of oropharynx 05/15/2014  . Musculoskeletal pain 08/16/2012  . Scalp pain 01/16/2015  . Sciatica 10/08/2014  . Seizures ([place])   . Stiffness of neck 05/16/2014   Past Surgical History:  Past Surgical History:  Procedure Laterality Date  . ABDOMINAL HYSTERECTOMY    . TONSILECTOMY, ADENOIDECTOMY, BILATERAL MYRINGOTOMY AND TUBES     HPI:  Pt is a 76 yo female admitted with AMS, right sided weakness, and difficulty speaking. 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 It also revealed innumerable scattered foci of chronic hemorrhages, remote R thalamic and L cerebellar infarcts, and generalized cerebral atrophy. PMH includes dementia, seizures, mass of oropharynx (2015), GERD, dysphagia, DM, BPPV, bipolar affective. 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   Assessment /  Plan / Recommendation Clinical Impression  Pt's overall mentation is impacted by her lethargy. She needs Max cues for fleeting periods of sustained attention, lasting only a few seconds at a time. When she is alert, she needs models/gestural cues to follow one-step  commands. She responds "yes" to all yes/no questions attempted. She did state her first name followed by her maiden name when given a sentence completion cue. SLP was unable to decipher additional, minimal verbal output, but family was able to hear a few clear words. Pt will need additional SLP f/u to maximize functional communication and better determine current cognitive-linguistic skills as her alertness improves.    SLP Assessment  SLP Recommendation/Assessment: Patient needs continued Speech Lanaguage Pathology Services SLP Visit Diagnosis: Cognitive communication deficit (R41.841)    Follow Up Recommendations   (tba)    Frequency and Duration min 2x/week  2 weeks      SLP Evaluation Cognition  Overall Cognitive Status: Impaired/Different from baseline Arousal/Alertness: Lethargic Orientation Level: Oriented to person;Disoriented to place;Disoriented to time;Disoriented to situation (responds to name) Attention: Focused;Sustained Focused Attention: Impaired Focused Attention Impairment: Functional basic;Verbal basic Sustained Attention: Impaired Sustained Attention Impairment: Verbal basic;Functional basic Awareness: Impaired Awareness Impairment: Emergent impairment Safety/Judgment: Impaired       Comprehension  Auditory Comprehension Overall Auditory Comprehension: Impaired Yes/No Questions: Impaired Basic Immediate Environment Questions: 0-24% accurate Commands: Impaired One Step Basic Commands: 0-24% accurate    Expression Expression Primary Mode of Expression: Verbal Verbal Expression Overall Verbal Expression:  (difficult to assess - minimal output, difficult to [place])   Oral / Motor  Oral Motor/Sensory Function Overall Oral Motor/Sensory Function: Other (comment) (difficulty following commands to assess) Motor Speech Overall Motor Speech: Impaired Phonation: Low vocal intensity Articulation: Impaired Level of Impairment: Word Intelligibility:  Intelligibility reduced Word: 25-49% accurate   GO                    Germain Osgood 03/02/2017, 10:31 AM  Germain Osgood, M.A. CCC-SLP [phone]

## 2017-03-02 NOTE — Progress Notes (Signed)
STROKE TEAM PROGRESS NOTE   HISTORY OF PRESENT ILLNESS (per record) Kristine Boyer is an 76 y.o. female with known dementia who was last known well at 1900 hours last night 02/28/2017. This AM she was noted not to be talking and not moving her right side as well. 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 Currently she is weaker on the right and aphasic. She is on Eliquis for Afib. CT head obtained and showed a left thalamic hemorrhage. She was placed on a Cleviprex infusion for management of hypertension in the setting of hemorrhage.   With regards to her dementia, her son is at the bedside and he reports that she does require some assistance with some of her ADLs. For example, she is no longer allowed to prepare her own food but is able to feed herself and food is prepared for her. Family has to pick out her clothes but she is able to dress herself. She no longer drives.   She failed her swallow screen.   Neurology is consulted for admission. She was admitted to the neuro ICU for further evaluation and treatment.   SUBJECTIVE (INTERVAL HISTORY) Daughter and grandson are at bedside. Daughter said pt ran off eliquis last Thursday so she did not take eliquis for the last 5 days. She did not receive Kcental. She still has global aphasia and right hemiparesis. BP stable on cleviprex. Swallow pending.    OBJECTIVE Temp:  [97.9 F (36.6 C)-99.2 F (37.3 C)] 99.2 F (37.3 C) (06/05 0400) Pulse Rate:  [72-116] 79 (06/05 0800) Cardiac Rhythm: Normal sinus rhythm (06/05 0800) Resp:  [10-30] 27 (06/05 0800) BP: (88-192)/(45-110) 144/82 (06/05 0800) SpO2:  [93 %-100 %] 97 % (06/05 0800) Weight:  [80.8 kg (178 lb 2.1 oz)] 80.8 kg (178 lb 2.1 oz) (06/04 1700)  CBC:  Recent Labs Lab 03/15/2017 0954 03/07/2017 1005 03/02/17 0215  WBC 7.8  --  9.7  NEUTROABS 5.4  --   --   HGB 10.4* 10.9* 11.6*  HCT 32.4* 32.0* 35.4*  MCV 81.6  --  80.5  PLT 174  --  165    Basic  Metabolic Panel:  Recent Labs Lab 03/15/2017 0954 03/08/2017 1005 03/02/17 0215  NA 139 141 140  K 3.7 3.7 3.3*  CL 103 101 105  CO2 28  --  25  GLUCOSE 219* 224* 143*  BUN 8 8 5*  CREATININE 0.97 0.90 0.86  CALCIUM 8.9  --  8.8*    Lipid Panel:    Component Value Date/Time   CHOL 140 12/03/2016 0409   TRIG 88 12/03/2016 0409   HDL 51 12/03/2016 0409   CHOLHDL 2.7 12/03/2016 0409   VLDL 18 12/03/2016 0409   LDLCALC 71 12/03/2016 0409   HgbA1c:  Lab Results  Component Value Date   HGBA1C 11.9 (H) 02/08/2017   Urine Drug Screen:    Component Value Date/Time   LABOPIA NONE DETECTED 03/05/2017 0940   COCAINSCRNUR NONE DETECTED 03/19/2017 0940   LABBENZ NONE DETECTED 03/14/2017 0940   AMPHETMU NONE DETECTED 03/18/2017 0940   THCU NONE DETECTED 03/26/2017 0940   LABBARB NONE DETECTED 03/15/2017 0940    Alcohol Level     Component Value Date/Time   ETH <5 03/23/2017 0954    IMAGING I have personally reviewed the radiological images below and agree with the radiology interpretations.  Ct Head Wo Contrast 03/02/2017 0706 Overall relatively similar exam to yesterday. Left thalamic 2.2 x 2  x 1.6 cm hematoma with surrounding vasogenic edema minimally changed from the prior examination when this measured 2.1 x 2 x 1.5 cm. Mass-effect upon the left lateral ventricle and third ventricle with 3 mm midline shift to the right. No development of hydrocephalus. 8 mm rounded hyperdensity right occipital lobe (series 5, image 54) unchanged to from 02/08/2017 and possibly related to remote hemorrhage. Chronic microvascular changes. Global atrophy.   Mr Brain Wo Contrast 03/22/2017 2221 1. 2.0 x 1.7 x 1.9 cm acute left thalamic hemorrhage. Associated vasogenic edema with localized 3 mm of left-to-right midline shift. No hydrocephalus or evidence for ventricular trapping. No intraventricular extension. Hypertensive etiology is suspected. 2. Innumerable scattered foci of chronic hemorrhages  involving the posterior bilateral cerebral hemispheres, deep gray nuclei, brainstem, and cerebellum. Distribution favors chronic underlying hypertension. 3. Small remote right thalamic lacunar infarcts with additional small remote left cerebellar infarct. 4. Generalized age-related cerebral atrophy with moderate chronic small vessel ischemic disease.   Ct Head Wo Contrast 02/28/2017 1049 2 cm left thalamic hematoma (~3 cc). Associated edema and local mild mass-effect.   CUS - 02/09/17 - Bilateral: Negative bilateral carotid duplex ultrasound..  TTE pending   PHYSICAL EXAM  Temp:  [97.9 F (36.6 C)-99.2 F (37.3 C)] 99.1 F (37.3 C) (06/05 0800) Pulse Rate:  [74-116] 80 (06/05 1000) Resp:  [12-30] 17 (06/05 1000) BP: (88-189)/(45-110) 128/69 (06/05 1000) SpO2:  [93 %-100 %] 99 % (06/05 1000) Weight:  [178 lb 2.1 oz (80.8 kg)] 178 lb 2.1 oz (80.8 kg) (06/04 1700)  General - Well nourished, well developed, in no apparent distress.  Ophthalmologic - Fundi not visualized due to noncooperation.  Cardiovascular - Regular rate and rhythm, not in afib.  Neuro - awake alert, global aphasia, not following commands, mumbling words, not sensible. Not able to repeat or name. Eyes deviated to left, barely crossing midline. Not blinking to visual threat on the right. PERRL. Right nasolabial fold flattening. Not following commands for tongue protrusion. LUE and LLE 5/5, RUE drift and 3+/5 proximal and 4/5 distal. RLE 3-/5 proximal and 4/5 distal. DTR 1+ and no babinski. Sensation, coordination and gait not tested.  ASSESSMENT/PLAN Kristine Boyer is a 76 y.o. female with history of AF on eliquis, baseline dementia, HTN, HLD, DM, BPPV, bipolar presenting with slurred speech and R hemiparesis. CT showed a L thalamic hemorrhage.   Stroke:   L thalamic hemorrhage with mild cerebral edema and midline shift likely due to uncontrolled hypertension  Resultant  Global aphasia, R hemiparesis  CT head L  thalamic hemorrhage with mild edema and mild mass effect  MRI head L thalamic hemorrhage w/ 74mm left to right shift. Scattered microhemorrhages. 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 Small vessel disease. Atrophy.   Repeat CT 24h no significant change, no increase in hmg or hydrocephalus  Carotid Doppler  01/2017 Unremarkable   2D Echo  pending  LDL 71 in March  HgbA1c 11.9 in May  SCDs for VTE prophylaxis  Diet NPO time specified  Eliquis (apixaban) daily prior to admission, now on No antithrombotic given hemorrhage.  Ongoing aggressive stroke risk factor management  Therapy recommendations:  pending   Disposition:  pending   Atrial Fibrillation  Home anticoagulation:  Eliquis (apixaban) daily - last dose 5 days ago  No an AC candidate now d/t hemorrhage  Home metoprolol 200 q 24h  CHA2DS2-VASc Score = at least 7  Age in Years:  ?21   +2    Sex:  Female   Female   +1    Hypertension History:  yes   +1     Diabetes Mellitus:  yes   +1   Congestive Heart Failure History:  0  Vascular Disease History:  0  Stroke/TIA/Thromboembolism History:  yes   +2  Will consider anticoagulation options once blood absorbed.   Hypertension  Home med: diovan  BP as high as 187/110  SBP goal < 140  Treated with Cleviprex, started in ED  Stable this am on Cleviprex  Consider po BP meds once passed swallow  Hyperlipidemia  Home meds:  lipitor 40  LDL 71 in March, almost at goal < 70  Continue statin at discharge  Diabetes  Home med: glucotrol  HgbA1c 11.9 in May, goal < 7.0  Uncontrolled  SSI  Other Stroke Risk Factors  Advanced age  UDS / ETOH level negative   Obesity, Body mass index is 31.55 kg/m., recommend weight loss, diet and exercise as appropriate   Other Active Problems  Baseline dementia - seroquel, desyrel  Bipolar  Hypothyroid on synthroid  Constipation on miralax, senna   Hospital day # 1  This patient is critically ill due to  left thalamic ICH, afib on eliquis, uncontrolled HTN, DM and at significant risk of neurological worsening, death form recurrent hemorrhage, stroke due to afib not on [facility], heart failure, hypertensive emergency, DKA. This patient's care requires constant monitoring of vital signs, hemodynamics, respiratory and cardiac monitoring, review of multiple databases, neurological assessment, discussion with family, other specialists and medical decision making of high complexity. I spent 45 minutes of neurocritical care time in the care of this patient. I had long discussion with daughter and grandson at bedside, updated pt current condition, treatment plan and potential prognosis. They expressed understanding and appreciation.    Rosalin Hawking, MD PhD Stroke Neurology 03/02/2017 10:45 AM    To contact Stroke Continuity provider, please refer to [email or web address] After hours, contact General Neurology

## 2017-03-02 NOTE — Progress Notes (Signed)
  Echocardiogram 2D Echocardiogram has been performed.  Kristine Boyer 03/02/2017, 4:47 PM

## 2017-03-03 ENCOUNTER — Encounter (HOSPITAL_COMMUNITY): Payer: Self-pay

## 2017-03-03 ENCOUNTER — Other Ambulatory Visit: Payer: Self-pay | Admitting: Internal Medicine

## 2017-03-03 DIAGNOSIS — I610 Nontraumatic intracerebral hemorrhage in hemisphere, subcortical: Secondary | ICD-10-CM

## 2017-03-03 DIAGNOSIS — F0390 Unspecified dementia without behavioral disturbance: Secondary | ICD-10-CM

## 2017-03-03 DIAGNOSIS — D62 Acute posthemorrhagic anemia: Secondary | ICD-10-CM

## 2017-03-03 DIAGNOSIS — I480 Paroxysmal atrial fibrillation: Secondary | ICD-10-CM

## 2017-03-03 DIAGNOSIS — R0682 Tachypnea, not elsewhere classified: Secondary | ICD-10-CM

## 2017-03-03 DIAGNOSIS — I5033 Acute on chronic diastolic (congestive) heart failure: Secondary | ICD-10-CM

## 2017-03-03 DIAGNOSIS — E119 Type 2 diabetes mellitus without complications: Secondary | ICD-10-CM

## 2017-03-03 DIAGNOSIS — E876 Hypokalemia: Secondary | ICD-10-CM

## 2017-03-03 DIAGNOSIS — R1312 Dysphagia, oropharyngeal phase: Secondary | ICD-10-CM

## 2017-03-03 DIAGNOSIS — I10 Essential (primary) hypertension: Secondary | ICD-10-CM

## 2017-03-03 DIAGNOSIS — I612 Nontraumatic intracerebral hemorrhage in hemisphere, unspecified: Secondary | ICD-10-CM

## 2017-03-03 LAB — BASIC METABOLIC PANEL
Anion gap: 12 (ref 5–15)
BUN: 8 mg/dL (ref 6–20)
CO2: 23 mmol/L (ref 22–32)
CREATININE: 0.82 mg/dL (ref 0.44–1.00)
Calcium: 8.4 mg/dL — ABNORMAL LOW (ref 8.9–10.3)
Chloride: 102 mmol/L (ref 101–111)
GFR calc Af Amer: >60 mL/min (ref >60)
Glucose, Bld: 145 mg/dL — ABNORMAL HIGH (ref 65–99)
Potassium: 3.3 mmol/L — ABNORMAL LOW (ref 3.5–5.1)
SODIUM: 137 mmol/L (ref 135–145)

## 2017-03-03 LAB — POCT I-STAT 3, ART BLOOD GAS (G3+)
BICARBONATE: 26.8 mmol/L (ref 20.0–28.0)
O2 SAT: 98 %
TCO2: 28 mmol/L (ref 0–100)
pCO2 arterial: 50.9 mmHg — ABNORMAL HIGH (ref 32.0–48.0)
pH, Arterial: 7.331 — ABNORMAL LOW (ref 7.350–7.450)
pO2, Arterial: 109 mmHg — ABNORMAL HIGH (ref 83.0–108.0)

## 2017-03-03 LAB — ECHOCARDIOGRAM COMPLETE
CHL CUP DOP CALC LVOT VTI: 20.9 cm
E decel time: 187 msec
FS: 38 % (ref 28–44)
Height: 63 in
IV/PV OW: 0.81
LA ID, A-P, ES: 42 mm
LA vol: 54.6 mL
LADIAMINDEX: 2.18 cm/m2
LAVOLA4C: 49.4 mL
LAVOLIN: 28.3 mL/m2
LDCA: 3.46 cm2
LEFT ATRIUM END SYS DIAM: 42 mm
LV PW d: 9.2 mm — AB (ref 0.6–1.1)
LVOT SV: 72 mL
LVOTD: 21 mm
LVOTPV: 103 cm/s
MV Dec: 187
MV Peak grad: 5 mmHg
MV pk E vel: 113 m/s
MVPKAVEL: 57.2 m/s
RV LATERAL S' VELOCITY: 14.4 cm/s
RV TAPSE: 22.7 mm
RV sys press: 30 mmHg
Reg peak vel: 258 cm/s
TR max vel: 258 cm/s
WEIGHTICAEL: 2850.11 [oz_av]

## 2017-03-03 LAB — GLUCOSE, CAPILLARY
GLUCOSE-CAPILLARY: 133 mg/dL — AB (ref 65–99)
GLUCOSE-CAPILLARY: 99 mg/dL (ref 65–99)
Glucose-Capillary: 106 mg/dL — ABNORMAL HIGH (ref 65–99)
Glucose-Capillary: 179 mg/dL — ABNORMAL HIGH (ref 65–99)
Glucose-Capillary: 115 mg/dL — ABNORMAL HIGH (ref 65–99)
Glucose-Capillary: 159 mg/dL — ABNORMAL HIGH (ref 65–99)

## 2017-03-03 LAB — CBC
HCT: 34.1 % — ABNORMAL LOW (ref 36.0–46.0)
Hemoglobin: 11.2 g/dL — ABNORMAL LOW (ref 12.0–15.0)
MCH: 26.4 pg (ref 26.0–34.0)
MCHC: 32.8 g/dL (ref 30.0–36.0)
MCV: 80.4 fL (ref 78.0–100.0)
PLATELETS: 152 10*3/uL (ref 150–400)
RBC: 4.24 MIL/uL (ref 3.87–5.11)
RDW: 13.5 % (ref 11.5–15.5)
WBC: 9.4 10*3/uL (ref 4.0–10.5)

## 2017-03-03 LAB — MAGNESIUM
MAGNESIUM: 2 mg/dL (ref 1.7–2.4)
Magnesium: 1.8 mg/dL (ref 1.7–2.4)

## 2017-03-03 LAB — PHOSPHORUS
PHOSPHORUS: 3.7 mg/dL (ref 2.5–4.6)
PHOSPHORUS: 2.7 mg/dL (ref 2.5–4.6)

## 2017-03-03 MED ORDER — VITAL HIGH PROTEIN PO LIQD
1000.0000 mL | ORAL | Status: DC
  Filled 2017-03-03: qty 1000

## 2017-03-03 MED ORDER — IRBESARTAN 150 MG PO TABS
150.0000 mg | ORAL_TABLET | Freq: Two times a day (BID) | ORAL | Status: DC
  Administered 2017-03-03 – 2017-03-08 (×11): 150 mg via ORAL
  Filled 2017-03-03 (×12): qty 1

## 2017-03-03 MED ORDER — JEVITY 1.2 CAL PO LIQD
1000.0000 mL | ORAL | Status: DC
  Administered 2017-03-03 – 2017-03-09 (×3): 1000 mL
  Filled 2017-03-03 (×5): qty 1000

## 2017-03-03 MED ORDER — LABETALOL HCL 5 MG/ML IV SOLN
10.0000 mg | INTRAVENOUS | Status: DC | PRN
  Administered 2017-03-04: 10 mg via INTRAVENOUS
  Administered 2017-03-07: 20 mg via INTRAVENOUS
  Administered 2017-03-08: 10 mg via INTRAVENOUS
  Administered 2017-03-12 – 2017-03-13 (×3): 20 mg via INTRAVENOUS
  Administered 2017-03-14 – 2017-03-15 (×2): 10 mg via INTRAVENOUS
  Filled 2017-03-03 (×11): qty 4

## 2017-03-03 MED ORDER — PRO-STAT SUGAR FREE PO LIQD
30.0000 mL | Freq: Two times a day (BID) | ORAL | Status: DC
  Administered 2017-03-03: 30 mL
  Filled 2017-03-03: qty 30

## 2017-03-03 MED ORDER — PRO-STAT SUGAR FREE PO LIQD
30.0000 mL | Freq: Every day | ORAL | Status: DC
  Administered 2017-03-04 – 2017-03-09 (×8): 30 mL
  Filled 2017-03-03 (×7): qty 30

## 2017-03-03 MED ORDER — LEVOTHYROXINE SODIUM 50 MCG PO TABS
50.0000 ug | ORAL_TABLET | Freq: Every day | ORAL | Status: DC
  Administered 2017-03-04 – 2017-03-08 (×6): 50 ug via ORAL
  Filled 2017-03-03 (×7): qty 1

## 2017-03-03 NOTE — Progress Notes (Signed)
Patient agitated, rapid breathing. Family concerned about safety and sleep apnea. MD called for orders for CPAP and posey soft waist restraint. After CPAP application, family still concerned about breathing pattern. MD paged and orders received for ABG, results from draw normal. Will continue to monitor patient for changes.

## 2017-03-03 NOTE — Progress Notes (Signed)
Cortrak Tube Team Note:  Consult received to place a Cortrak feeding tube.   A 10 F Cortrak tube was placed in the left nare and secured with a nasal bridle at 60 cm. Per the Cortrak monitor reading the tube tip is stomach.   No x-ray is required. RN may begin using tube.   If the tube becomes dislodged please keep the tube and contact the Cortrak team at [email or web address] ([email or web address]) for replacement.  If after hours and replacement cannot be delayed, place a NG tube and confirm placement with an abdominal x-ray.    Kerman Passey MS, RD, LDN [phone] Pager  [phone] Weekend/On-Call Pager

## 2017-03-03 NOTE — Progress Notes (Signed)
  Speech Language Pathology Treatment: Dysphagia  Patient Details Name: Kristine Boyer MRN: [id number] DOB: Nov 24, 1940 Today's Date: 03/03/2017 Time: 3299-2426 SLP Time Calculation (min) (ACUTE ONLY): 19 min  Assessment / Plan / Recommendation Clinical Impression  SLP returned this afternoon for additional swallow tx when pt was more alert. Her oral phase occurs much more swiftly and without residuals remaining when given thin liquids and purees. One immediate cough was noted with thin liquids, otherwise without overt signs of aspiration noted. Pt did however show signs of dysphagia that include multiple subswallows per bolus. Given her known, large osteophytes, recommend to proceed with MBS to better assess oropharyngeal swallowing. Pt may have sips of water or ice chips after oral care pending testing, hopefully to be done on next date pending pt's continued alertness.   HPI HPI: Pt is a 76 yo female admitted with AMS, right sided weakness, and difficulty speaking. 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 It also revealed innumerable scattered foci of chronic hemorrhages, remote R thalamic and L cerebellar infarcts, and generalized cerebral atrophy. PMH includes dementia, seizures, mass of oropharynx (2015), GERD, dysphagia, DM, BPPV, bipolar affective. 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      SLP Plan  MBS       Recommendations  Diet recommendations: NPO;Other(comment) (water or ice chips after oral care) Liquids provided via: Cup;Straw Medication Administration: Via alternative means Supervision: Patient able to self feed;Full supervision/cueing for compensatory  strategies Compensations: Slow rate;Small sips/bites                Oral Care Recommendations: Oral care QID;Oral care prior to ice chip/H20 Follow up Recommendations: Inpatient Rehab SLP Visit Diagnosis: Dysphagia, unspecified (R13.10) Plan: MBS       GO                Germain Osgood 03/03/2017, 4:02 PM  Germain Osgood, M.A. CCC-SLP [phone]

## 2017-03-03 NOTE — Progress Notes (Signed)
  Speech Language Pathology Treatment: Dysphagia;Cognitive-Linquistic  Patient Details Name: CHRISTOPHER HINK MRN: [id number] DOB: [birth year] Today's Date: 03/03/2017 Time: 1014-1030 SLP Time Calculation (min) (ACUTE ONLY): 16 min  Assessment / Plan / Recommendation Clinical Impression  SLP provided Max cues for arousal with fleeting alertness lasting only a few seconds at a time. She stated her first name and her maiden name when given a sentence completion cue. Labial seal was noted in response to oral care, but she did not achieve adequate labial seal when presented with PO trials. She did take in one ice chip but almost immediately fell asleep with it in her mouth, requiring Max cues from SLP for sustained attention and placement on her L side for increased awareness. As the ice chip melted, no pharyngeal swallow response could be observed. Recommend for her to remain NPO pending improved alertness.   HPI HPI: Pt is a 76 yo female admitted with AMS, right sided weakness, and difficulty speaking. 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 It also revealed innumerable scattered foci of chronic hemorrhages, remote R thalamic and L cerebellar infarcts, and generalized cerebral atrophy. PMH includes dementia, seizures, mass of oropharynx (2015), GERD, dysphagia, DM, BPPV, bipolar affective. 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      SLP Plan  Continue with current plan of care       Recommendations  Diet recommendations: NPO Medication Administration: Via alternative means                Oral Care Recommendations: Oral care QID Follow up Recommendations:  (tba) SLP Visit Diagnosis:  Dysphagia, unspecified (R13.10);Cognitive communication deficit (W96.759) Plan: Continue with current plan of care       GO                Germain Osgood 03/03/2017, 11:39 AM  Germain Osgood, M.A. CCC-SLP [phone]

## 2017-03-03 NOTE — Progress Notes (Signed)
PT Cancellation Note  Patient Details Name: Kristine Boyer MRN: [id number] DOB: September 24, 1941   Cancelled Treatment:    Reason Eval/Treat Not Completed: Patient not medically ready Pt on bedrest. Please update activity orders when appropriate for therapy. Thanks   Olen Eaves A Javyn Havlin 03/03/2017, 8:30 AM Wray Kearns, PT, DPT [phone]

## 2017-03-03 NOTE — Consult Note (Signed)
Physical Medicine and Rehabilitation Consult Reason for Consult: Right sided weakness with altered mental status Referring Physician: Dr. Erlinda Hong   HPI: Kristine Boyer is a 76 y.o. right handed female with history of dementia, diabetes mellitus, atrial fibrillation maintained on Eliquis and hypertension. Per chart review and son/daughter patient lives with son and daughter who rotate assistance biweekly. Reported to be independent prior to admission needing assistance for safety. Presented 03/07/2017 with altered mental status and right-sided weakness. CT of the head reviewed, showing left thalamic hematoma.  Per report, 2 cm left thalamic hematoma associated edema and local mild mass effect. MRI showed a 2 x 1.7 x 1.9 acute left thalamic hemorrhage. Associated vasogenic edema with localized 3 mm left-to-right midline shift no hydrocephalus. Echocardiogram with ejection fraction 29% grade 2 diastolic dysfunction. Neurosurgery consulted with conservative care. Nasogastric tube feeds for nutritional support. Physical therapy evaluation completed with recommendations of physical medicine rehabilitation consult. Pt with limited/no interaction.  Per family pt somnolent.   Review of Systems  Unable to perform ROS: Mental acuity   Past Medical History:  Diagnosis Date  . Abdominal pain 10/04/2013  . Adenomatous colon polyp   . Allergy   . Altered sensation, foot 04/26/2012  . Alzheimer disease   . Anemia   . Bipolar affective ([place])   . Blood transfusion without reported diagnosis   . BPPV (benign paroxysmal positional vertigo) 05/18/2011  . Cataract   . Chest pain 06/18/2016  . De Quervain's syndrome (tenosynovitis) 02/23/2011   Left wrist. Has worked w/ sports medicine and received steroid injection w/o relief.   . Diabetes mellitus   . Dysphagia 08/28/2013  . Dysuria 03/21/2014  . Flank pain 08/28/2013  . Foot pain, right 11/11/2010  . GERD (gastroesophageal reflux disease)   . Hyperlipidemia     . Hypertension   . Hypokalemia 03/18/2011   Takes potassium pills if develops muscle cramps. Eats lots of bananas. 11/2011   . Hypothyroidism   . Immune deficiency disorder ([place])   . Internal hemorrhoids   . Leg swelling 02/14/2013  . Mass of oropharynx 05/15/2014  . Musculoskeletal pain 08/16/2012  . Scalp pain 01/16/2015  . Sciatica 10/08/2014  . Seizures ([place])   . Stiffness of neck 05/16/2014   Past Surgical History:  Procedure Laterality Date  . ABDOMINAL HYSTERECTOMY    . TONSILECTOMY, ADENOIDECTOMY, BILATERAL MYRINGOTOMY AND TUBES     Family History  Problem Relation Age of Onset  . Diabetes Mother   . CAD Mother   . Alcohol abuse Father   . Stomach cancer Sister   . Colon cancer Unknown        grandmother   Social History:  reports that she quit smoking about 5 years ago. She has never used smokeless tobacco. She reports that she does not drink alcohol or use drugs.  Allergies:  Allergies  Allergen Reactions  . Lactose Intolerance (Gi) Other (See Comments)    constipation  . Vicodin [Hydrocodone-Acetaminophen] Itching and Other (See Comments)    Dizzy and confused  . Tramadol Other (See Comments)    Distorted and confused  . Neurontin [Gabapentin] Other (See Comments)    Drowsy. Makes her feel "funny"   Medications Prior to Admission  Medication Sig Dispense Refill  . acetaminophen (TYLENOL) 325 MG tablet Take 650 mg by mouth every 6 (six) hours as needed for mild pain.    [name] atorvastatin (LIPITOR) 40 MG tablet Take 1 tablet (40 mg total) by mouth daily.  90 tablet 3  . ELIQUIS 5 MG TABS tablet TAKE 1 TABLET (5 MG TOTAL) BY MOUTH 2 TIMES DAILY. 60 tablet 5  . glipiZIDE (GLUCOTROL) 5 MG tablet Take 5 mg by mouth daily before breakfast.    . guaiFENesin-dextromethorphan (ROBITUSSIN DM) 100-10 MG/5ML syrup Take 5 mLs by mouth every 4 (four) hours as needed for cough. 118 mL 0  . levothyroxine (SYNTHROID, LEVOTHROID) 50 MCG tablet TAKE 1 TABLET (50 MCG TOTAL) BY MOUTH  DAILY. 90 tablet 0  . magnesium oxide (MAG-OX) 400 (241.3 Mg) MG tablet Take 1 tablet (400 mg total) by mouth 2 (two) times daily. (Patient taking differently: Take 400 mg by mouth daily. ) 60 tablet 3  . Melatonin 10 MG TABS Take 10 mg by mouth at bedtime.    . metoprolol (TOPROL-XL) 200 MG 24 hr tablet Take 1 tablet (200 mg total) by mouth daily. 90 tablet 3  . neomycin-bacitracin-polymyxin (NEOSPORIN) [phone] ointment Apply 1 application topically daily. Inner thigh    . QUEtiapine (SEROQUEL) 25 MG tablet Take 1 tablet (25 mg total) by mouth at bedtime. 30 tablet 0  . traZODone (DESYREL) 50 MG tablet Take 0.5-1 tablets (25-50 mg total) by mouth at bedtime as needed for sleep. (Patient taking differently: Take 50 mg by mouth at bedtime. ) 30 tablet 3  . valsartan (DIOVAN) 320 MG tablet TAKE 1 TABLET (320 MG TOTAL) BY MOUTH DAILY. 90 tablet 0  . metoprolol (TOPROL-XL) 200 MG 24 hr tablet TAKE 1 TABLET (200 MG TOTAL) BY MOUTH DAILY. (Patient not taking: Reported on 03/14/2017) 30 tablet 0  . oxyCODONE (OXY IR/ROXICODONE) 5 MG immediate release tablet Take 0.5-1 tablets (2.5-5 mg total) by mouth every 6 (six) hours as needed for severe pain. (Patient not taking: Reported on 03/12/2017) 45 tablet 0  . polyethylene glycol powder (GLYCOLAX/MIRALAX) powder Take 17 g by mouth daily. (Patient taking differently: Take 17 g by mouth daily as needed for mild constipation. ) 500 g 1  . senna (SENOKOT) 8.6 MG tablet Take 2 tablets (17.2 mg total) by mouth 2 (two) times daily. Once achieve daily BM decrease to one tablet daily (Patient taking differently: Take 2 tablets by mouth 2 (two) times daily as needed for constipation. Once achieve daily BM decrease to one tablet daily) 30 tablet 0    Home: Home Living Family/patient expects to be discharged to:: Private residence Living Arrangements: Children Available Help at Discharge: Family, Available 24 hours/day Type of Home: House Home Access: Stairs to  enter [employer] of Steps: 4 Entrance Stairs-Rails: Right Home Layout: One level Bathroom Shower/Tub: Chiropodist: Standard Home Equipment: Cane - single point, Environmental consultant - 2 wheels, Bedside commode Additional Comments: At son's and daughter's house, pt negotiates 5 steps to get into home. Needs to go to second level at son's house to shower.  Lives With: Son, Daughter, Other (Comment) (2 wks with her dtr, then 2 wks with her son)  Functional History: Prior Function Level of Independence: Independent Functional Status:  Mobility: Bed Mobility Overal bed mobility: Needs Assistance Bed Mobility: Supine to Sit Supine to sit: Mod assist, HOB elevated General bed mobility comments: Manual cues to help pt initiate movement; assist with starting LEs but pt able to initiate trunk to come upright. Some difficulty getting RLE off bed.  Transfers Overall transfer level: Needs assistance Equipment used: 2 person hand held assist Transfers: Sit to/from Stand Sit to Stand: Min assist General transfer comment: Assist to power to standing with  cues for initiation and forward lean. Transferred to chair post ambulation. Pt incontinent of urine upon standing. Ambulation/Gait Ambulation/Gait assistance: Mod assist, +2 physical assistance Ambulation Distance (Feet): 6 Feet Assistive device: 2 person hand held assist Gait Pattern/deviations: Step-to pattern, Step-through pattern, Decreased stride length, Trunk flexed, Narrow base of support General Gait Details: Slow, unsteady gait with shuffling like steps, initially posterior lean with LEs stabilized on bedrail. Needed forward momentum to initiate steps. Gait velocity: decreased Gait velocity interpretation: <1.8 ft/sec, indicative of risk for recurrent falls    ADL:    Cognition: Cognition Overall Cognitive Status: Difficult to assess Arousal/Alertness: Lethargic Orientation Level: Oriented to person, Disoriented  to time, Disoriented to place, Disoriented to situation (expressive aphasia) Attention: Focused, Sustained Focused Attention: Impaired Focused Attention Impairment: Functional basic, Verbal basic Sustained Attention: Impaired Sustained Attention Impairment: Verbal basic, Functional basic Awareness: Impaired Awareness Impairment: Emergent impairment Safety/Judgment: Impaired Cognition Arousal/Alertness:  (initially lethargic but aroused with movement and upright) Behavior During Therapy: WFL for tasks assessed/performed Overall Cognitive Status: Difficult to assess General Comments: Pt with minimal verbalizations during session; nodded yes/no inconsistently-  not always appropriate. Difficult to assess due to aphasia. Difficulty following commands requiring gestural cues and tactile cues to initiate. Difficult to assess due to: Impaired communication  Blood pressure 130/70, pulse 72, temperature 98.9 F (37.2 C), temperature source Axillary, resp. rate 17, height 5\' 3"  (1.6 m), weight 80.8 kg (178 lb 2.1 oz), SpO2 97 %. Physical Exam  Vitals reviewed. Constitutional: She appears well-developed and well-nourished.  HENT:  Head: Normocephalic and atraumatic.  Nasogastric tube in place  Eyes: EOM are normal. Right eye exhibits no discharge. Left eye exhibits no discharge.  Neck: Normal range of motion. Neck supple. No thyromegaly present.  Cardiovascular: Normal rate and regular rhythm.   Respiratory: Effort normal and breath sounds normal. No respiratory distress.  GI: Soft. Bowel sounds are normal. She exhibits no distension.  Musculoskeletal: She exhibits no edema or tenderness.  B/l mitts in place  Neurological:  Somnolence vs. Global aphasia Not follow commands with inconsistent withdrawal to pain DTRs symmetric  Skin: Skin is warm and dry.  Psychiatric:  Unable to assess due to mentation    Results for orders placed or performed during the hospital encounter of 03/26/2017  (from the past 24 hour(s))  Glucose, capillary     Status: Abnormal   Collection Time: 03/02/17  4:19 PM  Result Value Ref Range   Glucose-Capillary 122 (H) 65 - 99 mg/dL  Glucose, capillary     Status: Abnormal   Collection Time: 03/02/17  7:34 PM  Result Value Ref Range   Glucose-Capillary 123 (H) 65 - 99 mg/dL  Glucose, capillary     Status: Abnormal   Collection Time: 03/02/17 11:23 PM  Result Value Ref Range   Glucose-Capillary 160 (H) 65 - 99 mg/dL  I-STAT 3, arterial blood gas (G3+)     Status: Abnormal   Collection Time: 03/03/17 12:56 AM  Result Value Ref Range   pH, Arterial 7.331 (L) 7.350 - 7.450   pCO2 arterial 50.9 (H) 32.0 - 48.0 mmHg   pO2, Arterial 109.0 (H) 83.0 - 108.0 mmHg   Bicarbonate 26.8 20.0 - 28.0 mmol/L   TCO2 28 0 - 100 mmol/L   O2 Saturation 98.0 %   Patient temperature 99.1 F    Collection site RADIAL, ALLEN'S TEST ACCEPTABLE    Drawn by RT    Sample type ARTERIAL   Glucose, capillary     Status:  Abnormal   Collection Time: 03/03/17  3:31 AM  Result Value Ref Range   Glucose-Capillary 106 (H) 65 - 99 mg/dL  Glucose, capillary     Status: Abnormal   Collection Time: 03/03/17  7:41 AM  Result Value Ref Range   Glucose-Capillary 133 (H) 65 - 99 mg/dL   Comment 1 Notify RN    Comment 2 Document in Chart   CBC     Status: Abnormal   Collection Time: 03/03/17  7:48 AM  Result Value Ref Range   WBC 9.4 4.0 - 10.5 K/uL   RBC 4.24 3.87 - 5.11 MIL/uL   Hemoglobin 11.2 (L) 12.0 - 15.0 g/dL   HCT 34.1 (L) 36.0 - 46.0 %   MCV 80.4 78.0 - 100.0 fL   MCH 26.4 26.0 - 34.0 pg   MCHC 32.8 30.0 - 36.0 g/dL   RDW 13.5 11.5 - 15.5 %   Platelets 152 150 - 400 K/uL  Basic metabolic panel     Status: Abnormal   Collection Time: 03/03/17  7:48 AM  Result Value Ref Range   Sodium 137 135 - 145 mmol/L   Potassium 3.3 (L) 3.5 - 5.1 mmol/L   Chloride 102 101 - 111 mmol/L   CO2 23 22 - 32 mmol/L   Glucose, Bld 145 (H) 65 - 99 mg/dL   BUN 8 6 - 20 mg/dL    Creatinine, Ser 0.82 0.44 - 1.00 mg/dL   Calcium 8.4 (L) 8.9 - 10.3 mg/dL   GFR calc non Af Amer >60 >60 mL/min   GFR calc Af Amer >60 >60 mL/min   Anion gap 12 5 - 15  Magnesium     Status: None   Collection Time: 03/03/17  7:48 AM  Result Value Ref Range   Magnesium 1.8 1.7 - 2.4 mg/dL  Phosphorus     Status: None   Collection Time: 03/03/17  7:48 AM  Result Value Ref Range   Phosphorus 3.7 2.5 - 4.6 mg/dL  Glucose, capillary     Status: None   Collection Time: 03/03/17 11:30 AM  Result Value Ref Range   Glucose-Capillary 99 65 - 99 mg/dL   Comment 1 Notify RN    Comment 2 Document in Chart    Ct Head Wo Contrast  Result Date: 03/02/2017 CLINICAL DATA:  76 year old hypertensive diabetic female with Alzheimer's disease. Follow-up intracranial hemorrhage. Subsequent encounter. EXAM: CT HEAD WITHOUT CONTRAST TECHNIQUE: Contiguous axial images were obtained from the base of the skull through the vertex without intravenous contrast. COMPARISON:  03/12/2017 MR and CT. FINDINGS: Brain: Left thalamic 2.2 x 2 x 1.6 cm hematoma with surrounding vasogenic edema minimally changed from the prior examination when this measured 2.1 x 2 x 1.5 cm. Mass-effect upon the left lateral ventricle and third ventricle with 3 mm midline shift to the right. No development of hydrocephalus. 8 mm rounded hyper density right occipital lobe (series 5, image 54) unchanged to from 02/08/2017 and possibly related to remote hemorrhage. Chronic microvascular changes. Global atrophy. Vascular: Vascular calcifications. Skull: No skull fracture. Sinuses/Orbits: Post lens replacement without acute orbital abnormality. Visualized paranasal sinuses are clear. Other: Mastoid air cells and middle ear cavities are clear. IMPRESSION: Overall relatively similar exam to yesterday. Left thalamic 2.2 x 2 x 1.6 cm hematoma with surrounding vasogenic edema minimally changed from the prior examination when this measured 2.1 x 2 x 1.5 cm.  Mass-effect upon the left lateral ventricle and third ventricle with 3 mm midline shift to the  right. No development of hydrocephalus. 8 mm rounded hyperdensity right occipital lobe (series 5, image 54) unchanged to from 02/08/2017 and possibly related to remote hemorrhage. Chronic microvascular changes. Global atrophy. Electronically Signed   By: Genia Del M.D.   On: 03/02/2017 07:06   Mr Brain Wo Contrast  Result Date: 03/03/2017 CLINICAL DATA:  Initial evaluation for intracranial hemorrhage. EXAM: MRI HEAD WITHOUT CONTRAST TECHNIQUE: Multiplanar, multiecho pulse sequences of the brain and surrounding structures were obtained without intravenous contrast. COMPARISON:  Prior CT from earlier the same day. FINDINGS: Brain: Study moderately degraded by motion artifact. Diffuse prominence of the CSF containing spaces is compatible with generalized age-related cerebral atrophy. Patchy and confluent T2/FLAIR hyperintensity within the periventricular and deep white matter both cerebral hemispheres most consistent with chronic small vessel ischemic disease, overall moderate nature. Few scatter remote lacunar infarcts present within the right thalamus. Small remote left cerebellar infarct noted as well. Previously identified acute left thalamic hemorrhage again seen, measuring 2.0 x 1.7 x 1.9 cm on current exam (estimated volume 3.2 cc). Adjacent vasogenic edema with localized 3 mm left-to-right shift. No evidence for intraventricular extension of hemorrhage. No hydrocephalus or ventricular trapping. No other evidence for acute or subacute ischemia. Gray-white matter differentiation otherwise maintained. Innumerable foci of susceptibility artifact seen involving the bilateral cerebral hemispheres, greatest posteriorly within the parieto-occipital regions. Additional foci present within the bilateral basal ganglia and thalami, brainstem, and cerebellum. Findings consistent with multiple chronic micro hemorrhages.  Distribution favors chronic underlying hypertension. No mass lesion. No extra-axial fluid collection. Major dural sinuses are grossly patent. Pituitary gland grossly unremarkable. Vascular: Major intracranial vascular flow voids are maintained. Skull and upper cervical spine: Craniocervical junction within normal limits. Visualized upper cervical spine grossly unremarkable. Bone marrow signal intensity within normal limits. No scalp soft tissue abnormality. Sinuses/Orbits: Globes and orbital soft tissues within normal limits. Patient status post lens extraction bilaterally. Paranasal sinuses are clear. No mastoid effusion. Inner ear structures grossly normal. Other: None. IMPRESSION: 1. 2.0 x 1.7 x 1.9 cm acute left thalamic hemorrhage. Associated vasogenic edema with localized 3 mm of left-to-right midline shift. No hydrocephalus or evidence for ventricular trapping. No intraventricular extension. Hypertensive etiology is suspected. 2. Innumerable scattered foci of chronic hemorrhages involving the posterior bilateral cerebral hemispheres, deep gray nuclei, brainstem, and cerebellum. Distribution favors chronic underlying hypertension. 3. Small remote right thalamic lacunar infarcts with additional small remote left cerebellar infarct. 4. Generalized age-related cerebral atrophy with moderate chronic small vessel ischemic disease. Electronically Signed   By: Jeannine Boga M.D.   On: 03/25/2017 22:21    Assessment/Plan: Diagnosis: Left thalamic hematoma Labs and images independently reviewed.  Records reviewed and summated above. Stroke: Continue secondary stroke prophylaxis and Risk Factor Modification listed below:   Blood Pressure Management:  Continue current medication with prn's with permisive HTN per primary team Diabetes management:   ?hemiparesis: fit for orthosis to prevent contractures (resting hand splint for day, wrist cock up splint at night, PRAFO, etc) Motor recovery:  Fluoxetine  1. Does the need for close, 24 hr/day medical supervision in concert with the patient's rehab needs make it unreasonable for this patient to be served in a less intensive setting? Yes 2. Co-Morbidities requiring supervision/potential complications: dementia, diabetes mellitus (Monitor in accordance with exercise and adjust meds as necessary), atrial fibrillation (monitor HR with increased physical activity), hypertension (monitor and provide prns in accordance with increased physical exertion and pain), grade 2 diastolic (monitor for signs/symptoms of fluid overload), tachypnea (monitor RR  and O2 Sats with increased physical exertion), hypokalemia (continue to monitor and replete as necessary), ABLA (transfuse if necessary to ensure appropriate perfusion for increased activity tolerance) 3. Due to bladder management, bowel management, safety, disease management, medication administration and patient education, does the patient require 24 hr/day rehab nursing? Yes 4. Does the patient require coordinated care of a physician, rehab nurse, PT (1-2 hrs/day, 5 days/week), OT (1-2 hrs/day, 5 days/week) and SLP (1-2 hrs/day, 5 days/week) to address physical and functional deficits in the context of the above medical diagnosis(es)? Yes Addressing deficits in the following areas: balance, endurance, locomotion, strength, transferring, bowel/bladder control, bathing, dressing, feeding, grooming, toileting, cognition, speech, language, swallowing and psychosocial support 5. Can the patient actively participate in an intensive therapy program of at least 3 hrs of therapy per day at least 5 days per week? Potentially 6. The potential for patient to make measurable gains while on inpatient rehab is excellent 7. Anticipated functional outcomes upon discharge from inpatient rehab are supervision and min assist  with PT, supervision and min assist with OT, min assist and mod assist with SLP. 8. Estimated rehab  length of stay to reach the above functional goals is: 18-22 days. 9. Anticipated D/C setting: Other 10. Anticipated post D/C treatments: HH therapy and Home excercise program 11. Overall Rehab/Functional Prognosis: good and fair  RECOMMENDATIONS: This patient's condition is appropriate for continued rehabilitative care in the following setting: CIR when patient able to tolerate 3 hours therapy/day. Patient has agreed to participate in recommended program. Potentially Note that insurance prior authorization may be required for reimbursement for recommended care.  Comment: Rehab Admissions Coordinator to follow up.  Delice Lesch, MD, Mellody Drown Cathlyn Parsons., PA-C 03/03/2017

## 2017-03-03 NOTE — Progress Notes (Signed)
Initial Nutrition Assessment  DOCUMENTATION CODES:   Obesity unspecified  INTERVENTION:   Jevity 1.2 @ 55 ml/hr (1320 ml/day) 30 ml Prostat daily Provides: 1684 kcal, 88 grams protein, and 1069 ml free water.   NUTRITION DIAGNOSIS:   Inadequate oral intake related to inability to eat as evidenced by NPO status.  GOAL:   Patient will meet greater than or equal to 90% of their needs  MONITOR:   Diet advancement, TF tolerance, Labs  REASON FOR ASSESSMENT:   Consult Enteral/tube feeding initiation and management  ASSESSMENT:   Pt with PMH of demntia, sz, mass of oropharynx (2015), GERD, dysphagia, DM, bipolar affective admitted from home where her son and daughter rotate staying with her with ICH with mass effect likely due to uncontrolled HTN.    Pt discussed during ICU rounds and with RN.  6/5 Cortrak placed due to periods of lethargy (tip in stomach)  Spoke with pt's family. Daughter reports that pt stays with her for 2 weeks and then her brother for two weeks. She eats well when at her house but cannot speak to how she eats at her brother's house. Pt was able to perform all of her ADLs and walk short distances PTA.  Reports usual weight of 180 lb.   Medications reviewed and include: senokot-s Labs reviewed: K+ 3.3, magnesium and phosphorus are WNL   Diet Order:  Diet NPO time specified  Skin:  Reviewed, no issues  Last BM:  unknown  Height:   Ht Readings from Last 1 Encounters:  02/28/2017 5\' 3"  (1.6 m)    Weight:   Wt Readings from Last 1 Encounters:  03/20/2017 178 lb 2.1 oz (80.8 kg)    Ideal Body Weight:  52.2 kg  BMI:  Body mass index is 31.55 kg/m.  Estimated Nutritional Needs:   Kcal:  1500-1700  Protein:  80-95 grams  Fluid:  > 1.5 L/day  EDUCATION NEEDS:   No education needs identified at this time  [place], [place], [place] Pager [phone] After Hours Pager

## 2017-03-03 NOTE — Evaluation (Signed)
Occupational Therapy Evaluation Patient Details Name: Kristine Boyer MRN: [id number] DOB: 1940/12/19 Today's Date: 03/03/2017    History of Present Illness Patient is a 76 y/o female who presents with AMS, right sided weakness, and difficulty speaking. 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 PMH includes dementia, seizures, mass of oropharynx (2015), GERD, dysphagia, DM, BPPV, bipolar affective.    Clinical Impression   PTA, pt was independent with ADL and functional mobility, although uses cane at times per daughter. She lives with her children alternating between her daughter's home and her son's home for 2 weeks at a time. Pt currently requires overall max assist for UB and LB ADL and min assist +2 for toilet transfers. She presents with decreased cognition, communication deficits, decreased motor planning skills, and R sided weakness impacting her ability to participate in ADL at [facility]. Feel pt would best benefit from CIR level therapies post-acute D/C in order to maximize return to independent PLOF. OT will continue to follow acutely.     Follow Up Recommendations  CIR;Supervision/Assistance - 24 hour    Equipment Recommendations  Other (comment) (TBD at next venue of care)    Recommendations for Other Services Rehab consult     Precautions / Restrictions Precautions Precautions: Fall Precaution Comments: SBP <140; left mitt; cortrak Restrictions Weight Bearing Restrictions: No      Mobility Bed Mobility Overal bed mobility: Needs Assistance Bed Mobility: Supine to Sit     Supine to sit: Mod assist;HOB elevated     General bed mobility comments: Manual cues to help pt initiate movement; assist with starting LEs but pt able to initiate trunk to come upright. Some difficulty getting RLE off bed.   Transfers Overall transfer level: Needs assistance Equipment used: 2 person hand held  assist Transfers: Sit to/from Stand Sit to Stand: Min assist;+2 physical assistance;+2 safety/equipment         General transfer comment: Assist to power to standing with cues for initiation and forward lean. Transferred to chair post ambulation. Pt incontinent of urine upon standing.    Balance Overall balance assessment: Needs assistance Sitting-balance support: Feet supported;No upper extremity supported Sitting balance-Leahy Scale: Fair Sitting balance - Comments: Able to sit EOB unsupported.    Standing balance support: During functional activity;Bilateral upper extremity supported Standing balance-Leahy Scale: Poor Standing balance comment: Reliant on external support (handheld assist x2) for static and dynamic standing                           ADL either performed or assessed with clinical judgement   ADL Overall ADL's : Needs assistance/impaired Eating/Feeding: NPO   Grooming: Maximal assistance;Sitting   Upper Body Bathing: Maximal assistance;Sitting   Lower Body Bathing: Sit to/from stand;Maximal assistance;+2 for safety/equipment   Upper Body Dressing : Maximal assistance;Sitting   Lower Body Dressing: Maximal assistance;Sit to/from stand;+2 for safety/equipment   Toilet Transfer: Minimal assistance;+2 for safety/equipment;Ambulation;BSC;+2 for physical assistance Toilet Transfer Details (indicate cue type and reason): 2 person handheld assist Toileting- Clothing Manipulation and Hygiene: Moderate assistance;Sit to/from stand       Functional mobility during ADLs: Minimal assistance;+2 for safety/equipment General ADL Comments: Pt limited by motor planning and cognitive deficits limiting her ability to complete ADL on command. She demonstrates improved independence when completing tasks automatically. For example, pt wanting to remove socks and although she did require significant  assistance, she was able to engage more readily.      Vision    Additional Comments: Able to track with therapist to B visual fields. Reports no double vision.      Perception     Praxis Praxis Praxis tested?: Deficits Deficits: Initiation;Organization;Ideomotor Praxis-Other Comments: DIfficult to assess due to cognitive deficits.    Pertinent Vitals/Pain Pain Assessment: Faces Faces Pain Scale: No hurt     Hand Dominance     Extremity/Trunk Assessment Upper Extremity Assessment Upper Extremity Assessment: RUE deficits/detail;Difficult to assess due to impaired cognition RUE Deficits / Details: 2/5 R shoulder strength and 3/5 R grasp. Pt with minimal movement of R UE on command but improved with automatic tasks. Appears to neglect R UE.    Lower Extremity Assessment Lower Extremity Assessment: Defer to PT evaluation RLE Deficits / Details: Grossly ~2+-3/5 throughout. Instability noted during functional tasks but no buckling.       Communication Communication Communication: No difficulties   Cognition Arousal/Alertness:  (initially lethargic but aroused with movement and upright) Behavior During Therapy: WFL for tasks assessed/performed Overall Cognitive Status: Difficult to assess Area of Impairment: Attention;Following commands;Problem solving                   Current Attention Level: Sustained   Following Commands: Follows one step commands inconsistently;Follows one step commands with increased time     Problem Solving: Slow processing;Decreased initiation;Difficulty sequencing;Requires verbal cues;Requires tactile cues General Comments: Pt with minimal verbalizations during session; nodded yes/no inconsistently-  not always appropriate. Difficult to assess due to aphasia. Difficulty following commands requiring gestural cues and tactile cues to initiate.   General Comments  Daughter and granddaughter present during session.     Exercises     Shoulder Instructions      Home Living Family/patient expects to be  discharged to:: Private residence Living Arrangements: Children Available Help at Discharge: Family;Available 24 hours/day Type of Home: House Home Access: Stairs to enter [employer] of Steps: 4 Entrance Stairs-Rails: Right Home Layout: One level     Bathroom Shower/Tub: Chief Strategy Officer: Standard     Home Equipment: Cane - single point;Walker - 2 wheels;Bedside commode   Additional Comments: At son's and daughter's house, pt negotiates 5 steps to get into home. Needs to go to second level at son's house to shower.      Prior Functioning/Environment Level of Independence: Independent                 OT Problem List: Decreased strength;Decreased range of motion;Decreased activity tolerance;Impaired balance (sitting and/or standing);Decreased safety awareness;Decreased knowledge of use of DME or AE;Decreased cognition;Impaired vision/perception;Decreased knowledge of precautions;Impaired UE functional use;Pain      OT Treatment/Interventions: Self-care/ADL training;Therapeutic exercise;Energy conservation;DME and/or AE instruction;Neuromuscular education;Cognitive remediation/compensation;Visual/perceptual remediation/compensation;Patient/family education;Balance training;Therapeutic activities    OT Goals(Current goals can be found in the care plan section) Acute Rehab OT Goals Patient Stated Goal: none stated OT Goal Formulation: With patient/family Time For Goal Achievement: 03/17/17 Potential to Achieve Goals: Good ADL Goals Pt Will Perform Grooming: with min assist;sitting Pt Will Perform Upper Body Dressing: sitting;with min assist Pt Will Perform Lower Body Dressing: with min assist;sit to/from stand Pt Will Transfer to Toilet: with supervision;ambulating;bedside commode (BSC over toilet) Pt Will Perform Toileting - Clothing Manipulation and hygiene: with min assist;sit to/from stand Pt/caregiver will Perform Home Exercise Program:  Increased ROM;Increased strength;Right Upper extremity;With Supervision;With written HEP provided Additional ADL Goal #1: Pt will demonstrate selective  attention during morning ADL routine in a minimally distracting environment.   OT Frequency: Min 3X/week   Barriers to D/C:            Co-evaluation PT/OT/SLP Co-Evaluation/Treatment: Yes Reason for Co-Treatment: Necessary to address cognition/behavior during functional activity PT goals addressed during session: Mobility/safety with mobility OT goals addressed during session: ADL's and self-care      AM-PAC PT "6 Clicks" Daily Activity     Outcome Measure Help from another person eating meals?: Total Help from another person taking care of personal grooming?: A Lot Help from another person toileting, which includes using toliet, bedpan, or urinal?: A Lot Help from another person bathing (including washing, rinsing, drying)?: A Lot Help from another person to put on and taking off regular upper body clothing?: A Lot Help from another person to put on and taking off regular lower body clothing?: A Lot 6 Click Score: 11   End of Session Equipment Utilized During Treatment: Gait belt Nurse Communication: Mobility status  Activity Tolerance: Patient tolerated treatment well Patient left: in chair;with call bell/phone within reach;with family/visitor present  OT Visit Diagnosis: Hemiplegia and hemiparesis;Unsteadiness on feet (R26.81);Muscle weakness (generalized) (M62.81);Other abnormalities of gait and mobility (R26.89);Apraxia (R48.2) Hemiplegia - Right/Left: Right Hemiplegia - dominant/non-dominant: Dominant Hemiplegia - caused by: Cerebral infarction                Time: 4098-1191 OT Time Calculation (min): 29 min Charges:  OT General Charges $OT Visit: 1 Procedure OT Evaluation $OT Eval Moderate Complexity: 1 Procedure G-Codes:     Doristine Section, MS OTR/L  Pager: [phone]   Khushboo Chuck A King Pinzon 03/03/2017, 3:33 PM

## 2017-03-03 NOTE — Progress Notes (Addendum)
STROKE TEAM PROGRESS NOTE   SUBJECTIVE (INTERVAL HISTORY) Daughter is at bedside. Pt did not pass swallow yesterday. She is NPO and has to be back on cleviprex low dose. Aphasia improved and now able to repeat with dysarthria. Still not able to have spontaneous speech. Daughter said pt was able to sing last night.    OBJECTIVE Temp:  [98.1 F (36.7 C)-99.6 F (37.6 C)] 98.7 F (37.1 C) (06/06 0800) Pulse Rate:  [72-93] 75 (06/06 0800) Cardiac Rhythm: Normal sinus rhythm (06/06 0800) Resp:  [0-33] 0 (06/06 0800) BP: (79-178)/(50-117) 137/67 (06/06 0800) SpO2:  [92 %-100 %] 99 % (06/06 0800)  CBC:  Recent Labs Lab 03/23/2017 0954  03/02/17 0215 03/03/17 0748  WBC 7.8  --  9.7 9.4  NEUTROABS 5.4  --   --   --   HGB 10.4*  < > 11.6* 11.2*  HCT 32.4*  < > 35.4* 34.1*  MCV 81.6  --  80.5 80.4  PLT 174  --  165 152  < > = values in this interval not displayed.  Basic Metabolic Panel:   Recent Labs Lab 03/02/17 0215 03/03/17 0748  NA 140 137  K 3.3* 3.3*  CL 105 102  CO2 25 23  GLUCOSE 143* 145*  BUN 5* 8  CREATININE 0.86 0.82  CALCIUM 8.8* 8.4*    Lipid Panel:     Component Value Date/Time   CHOL 140 12/03/2016 0409   TRIG 46 03/02/2017 1010   HDL 51 12/03/2016 0409   CHOLHDL 2.7 12/03/2016 0409   VLDL 18 12/03/2016 0409   LDLCALC 71 12/03/2016 0409   HgbA1c:  Lab Results  Component Value Date   HGBA1C 11.9 (H) 02/08/2017   Urine Drug Screen:     Component Value Date/Time   LABOPIA NONE DETECTED 02/26/2017 0940   COCAINSCRNUR NONE DETECTED 03/11/2017 0940   LABBENZ NONE DETECTED 03/15/2017 0940   AMPHETMU NONE DETECTED 03/22/2017 0940   THCU NONE DETECTED 02/26/2017 0940   LABBARB NONE DETECTED 03/23/2017 0940    Alcohol Level     Component Value Date/Time   ETH <5 03/27/2017 0954    IMAGING I have personally reviewed the radiological images below and agree with the radiology interpretations.  Ct Head Wo Contrast 03/02/2017 0706 Overall  relatively similar exam to yesterday. Left thalamic 2.2 x 2 x 1.6 cm hematoma with surrounding vasogenic edema minimally changed from the prior examination when this measured 2.1 x 2 x 1.5 cm. Mass-effect upon the left lateral ventricle and third ventricle with 3 mm midline shift to the right. No development of hydrocephalus. 8 mm rounded hyperdensity right occipital lobe (series 5, image 54) unchanged to from 02/08/2017 and possibly related to remote hemorrhage. Chronic microvascular changes. Global atrophy.   Mr Brain Wo Contrast 02/28/2017 2221 1. 2.0 x 1.7 x 1.9 cm acute left thalamic hemorrhage. Associated vasogenic edema with localized 3 mm of left-to-right midline shift. No hydrocephalus or evidence for ventricular trapping. No intraventricular extension. Hypertensive etiology is suspected. 2. Innumerable scattered foci of chronic hemorrhages involving the posterior bilateral cerebral hemispheres, deep gray nuclei, brainstem, and cerebellum. Distribution favors chronic underlying hypertension. 3. Small remote right thalamic lacunar infarcts with additional small remote left cerebellar infarct. 4. Generalized age-related cerebral atrophy with moderate chronic small vessel ischemic disease.   Ct Head Wo Contrast 03/21/2017 1049 2 cm left thalamic hematoma (~3 cc). Associated edema and local mild mass-effect.   CUS - 02/09/17 - Bilateral: Negative bilateral carotid duplex ultrasound.[name]  TTE - Normal LV size with mild LV hypertrophy. EF 60-65%. Moderate   diastolic dysfunction. Normal RV size and systolic function. No   significant valvular abnormalities. Left atrial enlargement.   PHYSICAL EXAM  Temp:  [98.1 F (36.7 C)-99.6 F (37.6 C)] 98.7 F (37.1 C) (06/06 0800) Pulse Rate:  [72-93] 75 (06/06 0800) Resp:  [0-33] 0 (06/06 0800) BP: (79-178)/(50-117) 137/67 (06/06 0800) SpO2:  [92 %-100 %] 99 % (06/06 0800)  General - Well nourished, well developed, in no apparent  distress.  Ophthalmologic - Fundi not visualized due to noncooperation.  Cardiovascular - Regular rate and rhythm, not in afib.  Neuro - awake alert, global aphasia, not following commands, mumbling words, not sensible. Not able to name, but able to repeat with dysarthria. Eyes left gaze preference, but able to cross midline. Blinking to visual threat bilaterally. PERRL. Right nasolabial fold flattening. Tongue midline. LUE and LLE 5/5, RUE drift and 3+/5 proximal and 4/5 distal. RLE 3/5 proximal and 4/5 distal. DTR 1+ and no babinski. Sensation, coordination not cooperative and gait not tested.  ASSESSMENT/PLAN Kristine Boyer is a 76 y.o. female with history of AF on eliquis, baseline dementia, HTN, HLD, DM, BPPV, bipolar presenting with slurred speech and R hemiparesis. CT showed a L thalamic hemorrhage.   Stroke:   L thalamic hemorrhage with mild cerebral edema and midline shift likely due to uncontrolled hypertension  Resultant  Global aphasia, R hemiparesis  CT head L thalamic hemorrhage with mild edema and mild mass effect  MRI head L thalamic hemorrhage w/ 56mm left to right shift. Scattered microhemorrhages. 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 Small vessel disease. Atrophy.   Repeat CT 24h no significant change, no increase in hmg or hydrocephalus  CT repeat in am  Carotid Doppler  01/2017 Unremarkable   2D Echo  EF 60-65%  LDL 71 in March  HgbA1c 11.9 in May  SCDs for VTE prophylaxis Diet NPO time specified  Eliquis (apixaban) daily prior to admission, now on No antithrombotic given hemorrhage.  Ongoing aggressive stroke risk factor management  Therapy recommendations:  pending   Disposition:  pending   Paroxysmal Atrial Fibrillation  Home anticoagulation:  Eliquis (apixaban) daily - last dose 5 days ago  No an AC candidate now d/t hemorrhage  Home metoprolol 200 q 24h  CHA2DS2-VASc Score = at least 7  Age in Years:  ?1   +2    Sex:  Female   Female    +1    Hypertension History:  yes   +1     Diabetes Mellitus:  yes   +1   Congestive Heart Failure History:  0  Vascular Disease History:  0  Stroke/TIA/Thromboembolism History:  yes   +2  Will consider anticoagulation options once blood absorbed.   Hypertension  Home med: diovan  BP as high as 187/110  SBP goal < 140  Treated with Cleviprex, started in ED  Stable this am but on low dose Cleviprex  Consider po BP meds once po access  Hyperlipidemia  Home meds:  lipitor 40  LDL 71 in March, almost at goal < 70  Continue statin at discharge  Diabetes  Home med: glucotrol  HgbA1c 11.9 in May, goal < 7.0  Uncontrolled  SSI  Dysphagia   Did not pass swallow  Baseline dysphagia due to unclear etiology  Will initiate TF  Speech to follow  Other Stroke Risk Factors  Advanced age  UDS / ETOH level negative  Obesity, Body mass index is 31.55 kg/m., recommend weight loss, diet and exercise as appropriate   Other Active Problems  Baseline dementia - seroquel, desyrel  Bipolar  Hypothyroid on synthroid  Constipation on miralax, senna   Hospital day # 2  This patient is critically ill due to left thalamic ICH, afib on eliquis, uncontrolled HTN, DM and at significant risk of neurological worsening, death form recurrent hemorrhage, stroke due to afib not on [facility], heart failure, hypertensive emergency, DKA. This patient's care requires constant monitoring of vital signs, hemodynamics, respiratory and cardiac monitoring, review of multiple databases, neurological assessment, discussion with family, other specialists and medical decision making of high complexity. I spent 35 minutes of neurocritical care time in the care of this patient. I had long discussion with daughter and grandson at bedside, updated pt current condition, treatment plan and potential prognosis. They expressed understanding and appreciation.    Rosalin Hawking, MD PhD Stroke  Neurology 03/03/2017 8:36 AM    To contact Stroke Continuity provider, please refer to [email or web address] After hours, contact General Neurology

## 2017-03-03 NOTE — Progress Notes (Signed)
Rehab Admissions Coordinator Note:  Patient was screened by Retta Diones for appropriateness for an Inpatient Acute Rehab Consult.  At this time, we are recommending Inpatient Rehab consult.  Jodell Cipro M 03/03/2017, 3:06 PM  I can be reached at [phone].

## 2017-03-03 NOTE — Evaluation (Signed)
Physical Therapy Evaluation Patient Details Name: Kristine Boyer MRN: [id number] DOB: [birth year] Today's Date: 03/03/2017   History of Present Illness  Patient is a 76 y/o female who presents with AMS, right sided weakness, and difficulty speaking. 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 PMH includes dementia, seizures, mass of oropharynx (2015), GERD, dysphagia, DM, BPPV, bipolar affective.   Clinical Impression  Patient presents with right sided weakness, global aphasia (expressive>receptive), decreased initiation and impaired mobility s/p above. Tolerated standing and taking a few steps to get to chair with Mod A of 2 for support/safety. Pt responded well with gestural cues to perform tasks but needed repetition and increased time. Lethargic initially but arousal improved with sitting upright. Pt ambulatory without use of DME and has 24/7 supervision from children PTA. Would benefit from intensive therapies to maximize independence and mobility prior to return home. Will follow acutely.    Follow Up Recommendations CIR;Supervision for mobility/OOB;Supervision/Assistance - 24 hour    Equipment Recommendations  Other (comment) (TBA)    Recommendations for Other Services       Precautions / Restrictions Precautions Precautions: Fall Precaution Comments: SBP <140; left mitt; cortrak Restrictions Weight Bearing Restrictions: No      Mobility  Bed Mobility Overal bed mobility: Needs Assistance Bed Mobility: Supine to Sit     Supine to sit: Mod assist;HOB elevated     General bed mobility comments: Manual cues to help pt initiate movement; assist with starting LEs but pt able to initiate trunk to come upright. Some difficulty getting RLE off bed.   Transfers Overall transfer level: Needs assistance Equipment used: 2 person hand held assist Transfers: Sit to/from Stand Sit to Stand: Min assist          General transfer comment: Assist to power to standing with cues for initiation and forward lean. Transferred to chair post ambulation. Pt incontinent of urine upon standing.  Ambulation/Gait Ambulation/Gait assistance: Mod assist;+2 physical assistance Ambulation Distance (Feet): 6 Feet Assistive device: 2 person hand held assist Gait Pattern/deviations: Step-to pattern;Step-through pattern;Decreased stride length;Trunk flexed;Narrow base of support Gait velocity: decreased Gait velocity interpretation: <1.8 ft/sec, indicative of risk for recurrent falls General Gait Details: Slow, unsteady gait with shuffling like steps, initially posterior lean with LEs stabilized on bedrail. Needed forward momentum to initiate steps.  Stairs            Wheelchair Mobility    Modified Rankin (Stroke Patients Only) Modified Rankin (Stroke Patients Only) Pre-Morbid Rankin Score: Moderate disability Modified Rankin: Moderately severe disability     Balance Overall balance assessment: Needs assistance Sitting-balance support: Feet supported;No upper extremity supported Sitting balance-Leahy Scale: Fair Sitting balance - Comments: Able to sit EOB unsupported.    Standing balance support: During functional activity;Bilateral upper extremity supported Standing balance-Leahy Scale: Poor Standing balance comment: Reliant on external support (handheld assist x2) for static and dynamic standing                             Pertinent Vitals/Pain Pain Assessment: Faces Faces Pain Scale: No hurt    Home Living Family/patient expects to be discharged to:: Private residence Living Arrangements: Children (son and daughter rotate biweekly) Available Help at Discharge: Family;Available 24 hours/day Type of Home: House Home Access: Stairs to enter Entrance Stairs-Rails: Right Entrance Stairs-Number of Steps: 4 Home Layout: One level Home Equipment:  Cane - single point;Walker - 2  wheels;Bedside commode Additional Comments: At son's and daughter's house, pt negotiates 5 steps to get into home. Needs to go to second level at son's house to shower.    Prior Function Level of Independence: Independent               Hand Dominance        Extremity/Trunk Assessment   Upper Extremity Assessment Upper Extremity Assessment: Defer to OT evaluation    Lower Extremity Assessment Lower Extremity Assessment: RLE deficits/detail;Difficult to assess due to impaired cognition RLE Deficits / Details: Grossly ~2+-3/5 throughout. Instability noted during functional tasks but no buckling.       Communication   Communication: No difficulties  Cognition Arousal/Alertness: Lethargic (initially lethargic but able to be aroused with movement and upright) Behavior During Therapy: WFL for tasks assessed/performed Overall Cognitive Status: Difficult to assess                                 General Comments: Pt with minimal verbalizations during session; nodded yes/no inconsistently-  not always appropriate. Difficult to assess due to aphasia. Difficulty following commands requiring gestural cues and tactile cues to initiate.      General Comments General comments (skin integrity, edema, etc.): Daughter present during session.    Exercises     Assessment/Plan    PT Assessment Patient needs continued PT services  PT Problem List Decreased strength;Decreased mobility;Decreased balance;Decreased activity tolerance;Decreased cognition;Decreased safety awareness       PT Treatment Interventions Gait training;DME instruction;Therapeutic exercise;Balance training;Cognitive remediation;Therapeutic activities;Patient/family education;Functional mobility training    PT Goals (Current goals can be found in the Care Plan section)  Acute Rehab PT Goals Patient Stated Goal: none stated PT Goal Formulation: Patient unable to participate in goal setting Time For  Goal Achievement: 03/17/17 Potential to Achieve Goals: Fair    Frequency Min 3X/week   Barriers to discharge Inaccessible home environment stairs to enter home    Co-evaluation PT/OT/SLP Co-Evaluation/Treatment: Yes Reason for Co-Treatment: Necessary to address cognition/behavior during functional activity;For patient/therapist safety;To address functional/ADL transfers PT goals addressed during session: Mobility/safety with mobility         AM-PAC PT "6 Clicks" Daily Activity  Outcome Measure Difficulty turning over in bed (including adjusting bedclothes, sheets and blankets)?: None Difficulty moving from lying on back to sitting on the side of the bed? : Total Difficulty sitting down on and standing up from a chair with arms (e.g., wheelchair, bedside commode, etc,.)?: Total Help needed moving to and from a bed to chair (including a wheelchair)?: A Little Help needed walking in hospital room?: A Lot Help needed climbing 3-5 steps with a railing? : Total 6 Click Score: 12    End of Session Equipment Utilized During Treatment: Gait belt Activity Tolerance: Patient tolerated treatment well Patient left: in chair;with call bell/phone within reach;with chair alarm set;with family/visitor present Nurse Communication: Mobility status PT Visit Diagnosis: Hemiplegia and hemiparesis;Muscle weakness (generalized) (M62.81);Unsteadiness on feet (R26.81) Hemiplegia - Right/Left: Right Hemiplegia - dominant/non-dominant: Dominant Hemiplegia - caused by: Other Nontraumatic intracranial hemorrhage    Time: 6962-9528 PT Time Calculation (min) (ACUTE ONLY): 31 min   Charges:   PT Evaluation $PT Eval Moderate Complexity: 1 Procedure     PT G Codes:        Mylo Red, PT, DPT [phone]    Blake Divine A Duey Liller 03/03/2017, 2:08 PM

## 2017-03-03 NOTE — Progress Notes (Signed)
Approximately 15 visitors in pts room spilling into hall at shift change. I explained that we would like to have two visitors at a time. I had to ask several times then the number of visitors went down to roughly 4. When I noted that the BP was over the goal, I told the family that she was receiving too much stimulation. The daughter got very agitated, stating, "I don't want to hear that from you, we've been through this" Etc Etc. I reinforced the patient needs of quiet environment for adequate BP control.

## 2017-03-03 NOTE — Care Management Note (Signed)
Case Management Note  Patient Details  Name: Kristine Boyer MRN: [id number] Date of Birth: Sep 01, 1941  Subjective/Objective:                 Patient admitted from home where her son and daughter rotate staying with her. She is admitted with ICH w mass effect 3 mm midline shift, with hypertensive etiology suspected. Patient has failed multiple swallow tests per huddle report and plan will be repeat CT tomorrow and possibly place cortrack in near future.    Action/Plan:  CM will continue to follow.  Expected Discharge Date:                  Expected Discharge Plan:  [facility]  In-House Referral:     Discharge planning Services  CM Consult  Post Acute Care Choice:    Choice offered to:     DME Arranged:    DME Agency:     HH Arranged:    [place] Agency:     Status of Service:  In process, will continue to follow  If discussed at Long Length of Stay Meetings, dates discussed:    Additional Comments:  Carles Collet, RN 03/03/2017, 11:37 AM

## 2017-03-03 NOTE — Progress Notes (Signed)
OT Cancellation Note  Patient Details Name: Kristine Boyer MRN: [id number] DOB: 1940/12/30   Cancelled Treatment:    Reason Eval/Treat Not Completed: Patient not medically ready. Pt on bedrest. Please update activity orders when appropriate for therapy. Thanks  [place], OT/L  [phone] 03/03/2017 03/03/2017, 7:09 AM

## 2017-03-04 ENCOUNTER — Inpatient Hospital Stay (HOSPITAL_COMMUNITY): Payer: Medicare HMO

## 2017-03-04 LAB — BASIC METABOLIC PANEL
Anion gap: 8 (ref 5–15)
BUN: 13 mg/dL (ref 6–20)
CALCIUM: 8.6 mg/dL — AB (ref 8.9–10.3)
CO2: 26 mmol/L (ref 22–32)
CREATININE: 0.87 mg/dL (ref 0.44–1.00)
Chloride: 103 mmol/L (ref 101–111)
GFR calc Af Amer: >60 mL/min (ref >60)
GFR calc non Af Amer: >60 mL/min (ref >60)
GLUCOSE: 163 mg/dL — AB (ref 65–99)
Potassium: 3.2 mmol/L — ABNORMAL LOW (ref 3.5–5.1)
SODIUM: 137 mmol/L (ref 135–145)

## 2017-03-04 LAB — CBC
HCT: 31.3 % — ABNORMAL LOW (ref 36.0–46.0)
Hemoglobin: 10.3 g/dL — ABNORMAL LOW (ref 12.0–15.0)
MCH: 26.3 pg (ref 26.0–34.0)
MCHC: 32.9 g/dL (ref 30.0–36.0)
MCV: 79.8 fL (ref 78.0–100.0)
PLATELETS: 174 10*3/uL (ref 150–400)
RBC: 3.92 MIL/uL (ref 3.87–5.11)
RDW: 13.1 % (ref 11.5–15.5)
WBC: 8.2 10*3/uL (ref 4.0–10.5)

## 2017-03-04 LAB — GLUCOSE, CAPILLARY
GLUCOSE-CAPILLARY: 155 mg/dL — AB (ref 65–99)
GLUCOSE-CAPILLARY: 166 mg/dL — AB (ref 65–99)
GLUCOSE-CAPILLARY: 169 mg/dL — AB (ref 65–99)
GLUCOSE-CAPILLARY: 203 mg/dL — AB (ref 65–99)
Glucose-Capillary: 128 mg/dL — ABNORMAL HIGH (ref 65–99)
Glucose-Capillary: 181 mg/dL — ABNORMAL HIGH (ref 65–99)

## 2017-03-04 LAB — MAGNESIUM
MAGNESIUM: 2.1 mg/dL (ref 1.7–2.4)
Magnesium: 2.1 mg/dL (ref 1.7–2.4)

## 2017-03-04 LAB — PHOSPHORUS
Phosphorus: 3.4 mg/dL (ref 2.5–4.6)
Phosphorus: 2.9 mg/dL (ref 2.5–4.6)

## 2017-03-04 MED ORDER — METOPROLOL SUCCINATE ER 100 MG PO TB24
200.0000 mg | ORAL_TABLET | Freq: Every day | ORAL | Status: DC
  Administered 2017-03-04 – 2017-03-05 (×2): 200 mg via ORAL
  Filled 2017-03-04: qty 2
  Filled 2017-03-04: qty 4

## 2017-03-04 MED ORDER — POTASSIUM CHLORIDE 20 MEQ/15ML (10%) PO SOLN
40.0000 meq | Freq: Once | ORAL | Status: AC
  Administered 2017-03-04: 40 meq via ORAL
  Filled 2017-03-04: qty 30

## 2017-03-04 MED ORDER — PANTOPRAZOLE SODIUM 40 MG PO TBEC
40.0000 mg | DELAYED_RELEASE_TABLET | Freq: Every day | ORAL | Status: DC
  Administered 2017-03-04: 40 mg via ORAL
  Filled 2017-03-04 (×2): qty 1

## 2017-03-04 MED ORDER — HEPARIN SODIUM (PORCINE) 5000 UNIT/ML IJ SOLN
5000.0000 [IU] | Freq: Three times a day (TID) | INTRAMUSCULAR | Status: DC
  Administered 2017-03-04 – 2017-03-15 (×33): 5000 [IU] via SUBCUTANEOUS
  Filled 2017-03-04 (×35): qty 1

## 2017-03-04 MED ORDER — POTASSIUM CHLORIDE CRYS ER 20 MEQ PO TBCR
40.0000 meq | EXTENDED_RELEASE_TABLET | ORAL | Status: DC
  Administered 2017-03-04: 40 meq via ORAL
  Filled 2017-03-04: qty 2

## 2017-03-04 NOTE — Progress Notes (Signed)
Modified Barium Swallow Progress Note  Patient Details  Name: Kristine Boyer MRN: [id number] Date of Birth: 09-14-1941  Today's Date: 03/04/2017  Modified Barium Swallow completed.  Full report located under Chart Review in the Imaging Section.  Brief recommendations include the following:  Clinical Impression  Pt has a moderate structural dysphagia due to large, bridging osteophytes throughout the cervical spine (see prior imaging studies for further MD interpretation) that impede bolus flow through the pharynx and limit epiglottic inversion. As a result, she has incomplete airway closure that allows for penetration during the swallow with thin liquids and purees. She also has moderate residuals in the vallecula that increase with pureed boluses. Pt appears to have some adequate sensation as she spontaneously performs multiple swallows per bolus to both clear the penetrates and reduce pharyngeal residuals. She is able to clear most of her pharynx with these efforts, and thin liquid boluses help to clear it further.   Recommend initiation of Dys 1 diet and thin liquids with use of small bites/sips, multiple swallows, and alternating solids/liquids. Cognitively, she does best to take thin liquids by straw so that she is a more active participant in feeding. Will continue to follow for tolerance and potentially diet advancement. I suspect that this is likely near her baseline, but will start cautiously given acute aphasia and waxing/waning mentation.   Swallow Evaluation Recommendations       SLP Diet Recommendations: Dysphagia 1 (Puree) solids;Thin liquid   Liquid Administration via: Straw   Medication Administration: Crushed with puree   Supervision: Staff to assist with self feeding;Full supervision/cueing for compensatory strategies   Compensations: Slow rate;Small sips/bites;Minimize environmental distractions;Follow solids with liquid;Multiple dry swallows after each bite/sip   Postural Changes: Remain semi-upright after after feeds/meals (Comment);Seated upright at 90 degrees   Oral Care Recommendations: Oral care BID        Germain Osgood 03/04/2017,11:35 AM   Germain Osgood, M.A. CCC-SLP [phone]

## 2017-03-04 NOTE — Progress Notes (Signed)
STROKE TEAM PROGRESS NOTE   SUBJECTIVE (INTERVAL HISTORY) Daughter is at bedside. Had NG yesterday and on TF. Going to have MBS today. Repeat CT head showed stable left BG/thalamic ICH.     OBJECTIVE Temp:  [98.1 F (36.7 C)-98.9 F (37.2 C)] 98.2 F (36.8 C) (06/07 0800) Pulse Rate:  [66-104] 74 (06/07 0800) Cardiac Rhythm: Normal sinus rhythm (06/07 0800) Resp:  [14-23] 17 (06/06 2136) BP: (116-159)/(65-94) 144/76 (06/07 0800) SpO2:  [95 %-100 %] 97 % (06/07 0800) Weight:  [177 lb 4 oz (80.4 kg)] 177 lb 4 oz (80.4 kg) (06/07 0500)  CBC:  Recent Labs Lab 03/19/2017 0954  03/03/17 0748 03/04/17 0452  WBC 7.8  < > 9.4 8.2  NEUTROABS 5.4  --   --   --   HGB 10.4*  < > 11.2* 10.3*  HCT 32.4*  < > 34.1* 31.3*  MCV 81.6  < > 80.4 79.8  PLT 174  < > 152 174  < > = values in this interval not displayed.  Basic Metabolic Panel:   Recent Labs Lab 03/03/17 0748 03/03/17 1833 03/04/17 0452  NA 137  --  137  K 3.3*  --  3.2*  CL 102  --  103  CO2 23  --  26  GLUCOSE 145*  --  163*  BUN 8  --  13  CREATININE 0.82  --  0.87  CALCIUM 8.4*  --  8.6*  MG 1.8 2.0 2.1  PHOS 3.7 2.7 3.4    Lipid Panel:     Component Value Date/Time   CHOL 140 12/03/2016 0409   TRIG 46 03/02/2017 1010   HDL 51 12/03/2016 0409   CHOLHDL 2.7 12/03/2016 0409   VLDL 18 12/03/2016 0409   LDLCALC 71 12/03/2016 0409   HgbA1c:  Lab Results  Component Value Date   HGBA1C 11.9 (H) 02/08/2017   Urine Drug Screen:     Component Value Date/Time   LABOPIA NONE DETECTED 03/13/2017 0940   COCAINSCRNUR NONE DETECTED 03/18/2017 0940   LABBENZ NONE DETECTED 03/14/2017 0940   AMPHETMU NONE DETECTED 03/25/2017 0940   THCU NONE DETECTED 03/03/2017 0940   LABBARB NONE DETECTED 03/25/2017 0940    Alcohol Level     Component Value Date/Time   ETH <5 03/03/2017 0954    IMAGING I have personally reviewed the radiological images below and agree with the radiology interpretations.  Ct Head Wo  Contrast 03/02/2017 0706 Overall relatively similar exam to yesterday. Left thalamic 2.2 x 2 x 1.6 cm hematoma with surrounding vasogenic edema minimally changed from the prior examination when this measured 2.1 x 2 x 1.5 cm. Mass-effect upon the left lateral ventricle and third ventricle with 3 mm midline shift to the right. No development of hydrocephalus. 8 mm rounded hyperdensity right occipital lobe (series 5, image 54) unchanged to from 02/08/2017 and possibly related to remote hemorrhage. Chronic microvascular changes. Global atrophy.   Mr Brain Wo Contrast 03/13/2017 2221 1. 2.0 x 1.7 x 1.9 cm acute left thalamic hemorrhage. Associated vasogenic edema with localized 3 mm of left-to-right midline shift. No hydrocephalus or evidence for ventricular trapping. No intraventricular extension. Hypertensive etiology is suspected. 2. Innumerable scattered foci of chronic hemorrhages involving the posterior bilateral cerebral hemispheres, deep gray nuclei, brainstem, and cerebellum. Distribution favors chronic underlying hypertension. 3. Small remote right thalamic lacunar infarcts with additional small remote left cerebellar infarct. 4. Generalized age-related cerebral atrophy with moderate chronic small vessel ischemic disease.   Ct Head Wo  Contrast 03/10/2017 1049 2 cm left thalamic hematoma (~3 cc). Associated edema and local mild mass-effect.   CUS - 02/09/17 - Bilateral: Negative bilateral carotid duplex ultrasound.[name]  TTE - Normal LV size with mild LV hypertrophy. EF 60-65%. Moderate   diastolic dysfunction. Normal RV size and systolic function. No   significant valvular abnormalities. Left atrial enlargement.  Ct Head Wo Contrast 03/04/2017 IMPRESSION: 1. Stable size of left thalamic hemorrhage, measuring 2.2 x 2.0 x 1.7 cm. Associated vasogenic edema is relatively similar with similar mass effect on the adjacent left lateral and third ventricles and stable 3 mm of left-to-right shift. No  hydrocephalus or ventricular trapping. 2. No other new acute intracranial process.      PHYSICAL EXAM  Temp:  [98.1 F (36.7 C)-98.9 F (37.2 C)] 98.2 F (36.8 C) (06/07 0800) Pulse Rate:  [66-104] 74 (06/07 0800) Resp:  [14-23] 17 (06/06 2136) BP: (116-159)/(65-94) 144/76 (06/07 0800) SpO2:  [95 %-100 %] 97 % (06/07 0800) Weight:  [177 lb 4 oz (80.4 kg)] 177 lb 4 oz (80.4 kg) (06/07 0500)  General - Well nourished, well developed, in no apparent distress.  Ophthalmologic - Fundi not visualized due to noncooperation.  Cardiovascular - Regular rate and rhythm, not in afib.  Neuro - awake alert, expressive aphasia > receptive aphasia, following some simple commands, mumbling words, not sensible. Not able to name, but able to repeat with dysarthria and perseveration. No gaze preference, attending to both sides. Blinking to visual threat bilaterally. PERRL. Right nasolabial fold flattening. Tongue midline. LUE and LLE 5/5, RUE drift and 3+/5 proximal and 4/5 distal. RLE 3/5 proximal and 4/5 distal. DTR 1+ and no babinski. Sensation, coordination not cooperative and gait not tested.  ASSESSMENT/PLAN [name] is a 76 y.o. female with history of AF on eliquis, baseline dementia, HTN, HLD, DM, BPPV, bipolar presenting with slurred speech and R hemiparesis. CT showed a L thalamic hemorrhage.   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 Scattered microhemorrhages. 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 Small vessel disease. Atrophy.   Repeat CT x 2 no significant change, no increase in hmg or hydrocephalus  Carotid Doppler  01/2017 Unremarkable   2D Echo  EF 60-65%  LDL 71 in March  HgbA1c 11.9 in May  SCDs for VTE  prophylaxis Diet NPO time specified  Eliquis (apixaban) daily prior to admission, now on No antithrombotic given hemorrhage.  Ongoing aggressive stroke risk factor management  Therapy recommendations:  CIR   Disposition:  pending   Paroxysmal Atrial Fibrillation  Home anticoagulation:  Eliquis (apixaban) daily - last dose 5 days ago  No an AC candidate now d/t hemorrhage  Home metoprolol 200 q 24h  CHA2DS2-VASc Score = at least 7  Age in Years:  ?31   +2    Sex:  Female   Female   +1    Hypertension History:  yes   +1     Diabetes Mellitus:  yes   +1   Congestive Heart Failure History:  0  Vascular Disease History:  0  Stroke/TIA/Thromboembolism History:  yes   +2  Will consider anticoagulation options once blood absorbed.   Hypertension  Home med: diovan and metoprolol  BP stable  SBP goal < 160  Off Cleviprex  Resumed ibersartan and metoprolol  Hyperlipidemia  Home meds:  lipitor 40  LDL 71 in March, almost at goal < 70  Continue statin at discharge  Diabetes  Home med: glucotrol  HgbA1c 11.9 in May, goal < 7.0  Uncontrolled  SSI  Dysphagia   Did not pass swallow  Baseline dysphagia due to unclear etiology  On TF  Speech to follow  MBS today  Other Stroke Risk Factors  Advanced age  UDS / ETOH level negative   Obesity, Body mass index is 31.4 kg/m., recommend weight loss, diet and exercise as appropriate   Other Active Problems  Baseline dementia - seroquel, desyrel  Bipolar  Hypothyroid on synthroid  Constipation on miralax, senna   Hospital day # 3  This patient is critically ill due to left thalamic ICH, afib on eliquis, uncontrolled HTN, DM and at significant risk of neurological worsening, death form recurrent hemorrhage, stroke due to afib not on [facility], heart failure, hypertensive emergency, DKA. This patient's care requires constant monitoring of vital signs, hemodynamics, respiratory and cardiac monitoring, review of  multiple databases, neurological assessment, discussion with family, other specialists and medical decision making of high complexity. I spent 35 minutes of neurocritical care time in the care of this patient. I had long discussion with daughter and grandson at bedside, updated pt current condition, treatment plan and potential prognosis. They expressed understanding and appreciation.    Kristine Hawking, Kristine Boyer Stroke Neurology 03/04/2017 10:34 AM    To contact Stroke Continuity provider, please refer to [email or web address] After hours, contact General Neurology

## 2017-03-04 NOTE — Progress Notes (Signed)
RT set up CPAP and placed on patient. Patient tolerating well at this time. RT will monitor as needed.  

## 2017-03-04 NOTE — Progress Notes (Signed)
Physical Therapy Treatment Patient Details Name: Kristine Boyer MRN: [id number] DOB: 07/25/1941 Today's Date: 03/04/2017    History of Present Illness Patient is a 76 y/o female who presents with AMS, right sided weakness, and difficulty speaking. 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 PMH includes dementia, seizures, mass of oropharynx (2015), GERD, dysphagia, DM, BPPV, bipolar affective.     PT Comments    Patient progressing well towards PT goals. Mainly limited by aphasia and decreased arousal. Tolerated gait training with Mod A of 2 for balance/safety. Requires assist for weight shifting and forward momentum to step. Pt is inconsistent when following simple commands, does better with gestural/demonstrational cues. Incontinent of urine upon standing again today. Might consider briefs for mobility. Will continue to follow. Hopefully arousal will improve so pt can tolerate more activity.    Follow Up Recommendations  CIR;Supervision for mobility/OOB;Supervision/Assistance - 24 hour     Equipment Recommendations  Other (comment)    Recommendations for Other Services       Precautions / Restrictions Precautions Precautions: Fall Precaution Comments: SBP <140; left mitt; cortrak Restrictions Weight Bearing Restrictions: No    Mobility  Bed Mobility Overal bed mobility: Needs Assistance Bed Mobility: Supine to Sit     Supine to sit: Max assist;+2 for physical assistance     General bed mobility comments: Assist of 2 to get to EOB as pt with decreased arousal and not initiating movement.   Transfers Overall transfer level: Needs assistance Equipment used: 2 person hand held assist Transfers: Sit to/from Stand Sit to Stand: Mod assist;+2 physical assistance         General transfer comment: Assist to power to standing with cues for initiation and forward lean. Transferred to chair post  ambulation. Pt incontinent of urine upon standing.  Ambulation/Gait Ambulation/Gait assistance: Mod assist;+2 physical assistance Ambulation Distance (Feet): 12 Feet Assistive device: 2 person hand held assist Gait Pattern/deviations: Step-to pattern;Step-through pattern;Decreased stride length;Trunk flexed;Narrow base of support Gait velocity: decreased Gait velocity interpretation: <1.8 ft/sec, indicative of risk for recurrent falls General Gait Details: Slow, unsteady gait with shuffling like steps, initially posterior lean with LEs stabilized on bedrail. Needed forward momentum to initiate steps. Assist with weight shifting and right lateral lean present throughout.   Stairs            Wheelchair Mobility    Modified Rankin (Stroke Patients Only) Modified Rankin (Stroke Patients Only) Pre-Morbid Rankin Score: Moderate disability Modified Rankin: Moderately severe disability     Balance Overall balance assessment: Needs assistance Sitting-balance support: Feet supported;No upper extremity supported Sitting balance-Leahy Scale: Fair Sitting balance - Comments: Able to sit EOB unsupported with moments of Min A due to decreased arousal.   Standing balance support: During functional activity;Bilateral upper extremity supported Standing balance-Leahy Scale: Poor Standing balance comment: Reliant on external support (handheld assist x2) for static and dynamic standing.                            Cognition Arousal/Alertness: Lethargic Behavior During Therapy: Flat affect Overall Cognitive Status: Difficult to assess                     Current Attention Level: Focused   Following Commands: Follows one step commands inconsistently;Follows one step commands with increased time     Problem Solving: Slow processing;Decreased initiation;Difficulty  sequencing;Requires verbal cues;Requires tactile cues General Comments: Minimal verbalizations this session.  Requires max stimulus to stay aroused today. + incontinent of urine upon standing again. Follows demonstration/gestures inconsistently,      Exercises      General Comments General comments (skin integrity, edema, etc.): daughter present during session.      Pertinent Vitals/Pain Pain Assessment: Faces Faces Pain Scale: No hurt    Home Living                      Prior Function            PT Goals (current goals can now be found in the care plan section) Progress towards PT goals: Progressing toward goals    Frequency    Min 3X/week      PT Plan Current plan remains appropriate    Co-evaluation              AM-PAC PT "6 Clicks" Daily Activity  Outcome Measure  Difficulty turning over in bed (including adjusting bedclothes, sheets and blankets)?: Total Difficulty moving from lying on back to sitting on the side of the bed? : Total Difficulty sitting down on and standing up from a chair with arms (e.g., wheelchair, bedside commode, etc,.)?: Total Help needed moving to and from a bed to chair (including a wheelchair)?: A Lot Help needed walking in hospital room?: A Lot Help needed climbing 3-5 steps with a railing? : Total 6 Click Score: 8    End of Session Equipment Utilized During Treatment: Gait belt Activity Tolerance: Patient limited by lethargy Patient left: in chair;with call bell/phone within reach;with chair alarm set;with family/visitor present Nurse Communication: Mobility status PT Visit Diagnosis: Hemiplegia and hemiparesis;Muscle weakness (generalized) (M62.81);Unsteadiness on feet (R26.81) Hemiplegia - Right/Left: Right Hemiplegia - dominant/non-dominant: Dominant Hemiplegia - caused by: Other Nontraumatic intracranial hemorrhage     Time: 0981-1914 PT Time Calculation (min) (ACUTE ONLY): 23 min  Charges:  $Gait Training: 8-22 mins $Therapeutic Activity: 8-22 mins                    G Codes:       Mylo Red, PT,  DPT [phone]     Blake Divine A Jujuan Dugo 03/04/2017, 11:24 AM

## 2017-03-04 NOTE — Progress Notes (Signed)
I met with pt's daughter at bedside to begin discussions about an inpt rehab admit. She prefers inpt rehab rather than SNF. I will begin insurance authorization with [employer]. [phone] 

## 2017-03-05 ENCOUNTER — Inpatient Hospital Stay (HOSPITAL_COMMUNITY): Payer: Medicare HMO

## 2017-03-05 LAB — GLUCOSE, CAPILLARY
GLUCOSE-CAPILLARY: 193 mg/dL — AB (ref 65–99)
GLUCOSE-CAPILLARY: 204 mg/dL — AB (ref 65–99)
Glucose-Capillary: 174 mg/dL — ABNORMAL HIGH (ref 65–99)
Glucose-Capillary: 188 mg/dL — ABNORMAL HIGH (ref 65–99)
Glucose-Capillary: 170 mg/dL — ABNORMAL HIGH (ref 65–99)

## 2017-03-05 LAB — CBC
HCT: 32 % — ABNORMAL LOW (ref 36.0–46.0)
HEMOGLOBIN: 10.7 g/dL — AB (ref 12.0–15.0)
MCH: 26.8 pg (ref 26.0–34.0)
MCHC: 33.4 g/dL (ref 30.0–36.0)
MCV: 80 fL (ref 78.0–100.0)
Platelets: 161 10*3/uL (ref 150–400)
RBC: 4 MIL/uL (ref 3.87–5.11)
RDW: 13.2 % (ref 11.5–15.5)
WBC: 8.8 10*3/uL (ref 4.0–10.5)

## 2017-03-05 LAB — BASIC METABOLIC PANEL
Anion gap: 9 (ref 5–15)
BUN: 12 mg/dL (ref 6–20)
CO2: 25 mmol/L (ref 22–32)
Calcium: 8.7 mg/dL — ABNORMAL LOW (ref 8.9–10.3)
Chloride: 105 mmol/L (ref 101–111)
Creatinine, Ser: 0.72 mg/dL (ref 0.44–1.00)
GFR calc Af Amer: >60 mL/min (ref >60)
GFR calc non Af Amer: >60 mL/min (ref >60)
Glucose, Bld: 171 mg/dL — ABNORMAL HIGH (ref 65–99)
POTASSIUM: 3.6 mmol/L (ref 3.5–5.1)
SODIUM: 139 mmol/L (ref 135–145)

## 2017-03-05 MED ORDER — BISACODYL 10 MG RE SUPP
10.0000 mg | Freq: Once | RECTAL | Status: AC
  Administered 2017-03-05: 10 mg via RECTAL
  Filled 2017-03-05: qty 1

## 2017-03-05 MED ORDER — METOPROLOL SUCCINATE ER 100 MG PO TB24
100.0000 mg | ORAL_TABLET | Freq: Two times a day (BID) | ORAL | Status: DC
  Administered 2017-03-05 – 2017-03-08 (×6): 100 mg via ORAL
  Filled 2017-03-05 (×8): qty 1

## 2017-03-05 MED ORDER — AMLODIPINE BESYLATE 10 MG PO TABS
10.0000 mg | ORAL_TABLET | Freq: Every day | ORAL | Status: DC
  Administered 2017-03-05 – 2017-03-07 (×3): 10 mg via ORAL
  Filled 2017-03-05 (×3): qty 1

## 2017-03-05 MED ORDER — PANTOPRAZOLE SODIUM 40 MG PO PACK
40.0000 mg | PACK | Freq: Every day | ORAL | Status: DC
  Administered 2017-03-05 – 2017-03-14 (×10): 40 mg
  Filled 2017-03-05 (×10): qty 20

## 2017-03-05 NOTE — Progress Notes (Signed)
Updated PT and OT therapy notes will be sent to [facility] today for possible admission Monday to [facility] pending insurance approval. [phone]

## 2017-03-05 NOTE — Progress Notes (Signed)
  Speech Language Pathology Treatment: Dysphagia  Patient Details Name: Kristine Boyer MRN: [id number] DOB: 10-31-1940 Today's Date: 03/05/2017 Time: 3845-3646 SLP Time Calculation (min) (ACUTE ONLY): 26 min  Assessment / Plan / Recommendation Clinical Impression  Pt was seen for f/u after MBS on previous date. Pt was easily aroused this morning for PO trials, taking in minimal amounts of thin liquid prior to having a bowel movement, at which time her daughter wanted her to be rolled onto her side and then cleaned. Due to this, no additional trials were given by SLP. SLP did stay and provide education and training to pt's daughter and another family member about the results/recommendations from [facility]. Video from MBS was played so that she could observe her mother's swallowing and have more thorough understanding about the rationale for her swallowing precautions. She also had questions about pt's future ability to swallow. I informed her that while she is compensating for her osteophytes well at this time, there is certainly the potential for her to not be able to compensate in the future should she become more deconditioned. She stated that she had no other questions at this time - will continue to follow. Pt would benefit from CIR to help maximize recovery.   HPI HPI: Pt is a 77 yo female admitted with AMS, right sided weakness, and difficulty speaking. 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 It also revealed innumerable scattered foci of chronic hemorrhages, remote R thalamic and L cerebellar infarcts, and generalized cerebral atrophy. PMH includes dementia, seizures, mass of oropharynx (2015), GERD, dysphagia, DM, BPPV, bipolar affective. 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      SLP Plan  Continue with current plan of care       Recommendations  Diet recommendations: Dysphagia 1 (puree);Thin liquid Liquids provided via: Straw Medication Administration: Crushed with puree Supervision: Staff to assist with self feeding;Full supervision/cueing for compensatory strategies Compensations: Slow rate;Small sips/bites;Minimize environmental distractions;Follow solids with liquid;Multiple dry swallows after each bite/sip Postural Changes and/or Swallow Maneuvers: Seated upright 90 degrees;Upright 30-60 min after meal                Oral Care Recommendations: Oral care BID Follow up Recommendations: Inpatient Rehab SLP Visit Diagnosis: Dysphagia, pharyngeal phase (R13.13) Plan: Continue with current plan of care       GO                Germain Osgood 03/05/2017, 2:58 PM  Germain Osgood, M.A. CCC-SLP [phone]

## 2017-03-05 NOTE — Progress Notes (Signed)
OT Cancellation Note  Patient Details Name: Kristine Boyer MRN: [id number] DOB: 1941/01/06   Cancelled Treatment:    Reason Eval/Treat Not Completed: Other (comment) (changing rooms will reattempt at more appropriate time)  Parke Poisson B 03/05/2017, 2:50 PM

## 2017-03-05 NOTE — Clinical Social Work Note (Signed)
Clinical Social Work Assessment  Patient Details  Name: Kristine Boyer MRN: [id number] Date of Birth: 10-13-1940  Date of referral:  03/05/17               Reason for consult:  Facility Placement, Discharge Planning                Permission sought to share information with:  Facility Sport and exercise psychologist, Family Supports Permission granted to share information::  Yes, Verbal Permission Granted  Name::     [name], Radio producer::  SNF  Relationship::  Son, Sales executive Information:     Housing/Transportation Living arrangements for the past 2 months:  Single Family Home Source of Information:  Adult Children Patient Interpreter Needed:  None Criminal Activity/Legal Involvement Pertinent to Current Situation/Hospitalization:  No - Comment as needed Significant Relationships:  Adult Children Lives with:  Self, Adult Children Do you feel safe going back to the place where you live?  Yes Need for family participation in patient care:  Yes (Comment) (patient has dementia, expressive aphasia)  Care giving concerns:  Patient has been going back and forth between her son and daughter's house every two weeks, for them to provide care for her. Prior to admission, patient was mostly independent, but now requires much more support and assistance. Patient's children have concerns about ability to care for her at home.   Social Worker assessment / plan:  CSW explained recommendation for CIR, and having a SNF back-up plan in place pending insurance approval. Patient's son and patient's daughter indicated understanding. CSW explained that if patient were to discharge with a feeding tube, that it would limit SNF options, but would not be a problem for CIR placement. Patient's son and patient's daughter indicated understanding and requested additional information on what facilities would be available if needed. CSW will follow to provide information on SNF options that can handle a cortrak  feeding tube, if needed.  Employment status:  Retired Nurse, adult PT Recommendations:  [place] / Referral to community resources:  [place]  Patient/Family's Response to care:  Patient's response unable to be assessed due to dementia and expressive aphasia. Patient's son and patient's daughter are agreeable to SNF placement, if needed, pending CIR insurance approval.  Patient/Family's Understanding of and Emotional Response to Diagnosis, Current Treatment, and Prognosis:  Patient's emotional understanding unable to be assessed due to dementia and expressive aphasia. Patient's son and patient's daughter seemed to understand patient's current functional limitations as well as the limits to the care that the family can currently provide. Patient's daughter expressed concern about wanting to make sure that the patient's needs were met. Patient's son and patient's daughter indicated understanding of CSW role in discharge planning.  Emotional Assessment Appearance:  Appears stated age Attitude/Demeanor/Rapport:  Unable to Assess Affect (typically observed):  Unable to Assess Orientation:  Oriented to Self Alcohol / Substance use:  Not Applicable Psych involvement (Current and /or in the community):  No (Comment)  Discharge Needs  Concerns to be addressed:  Care Coordination, Discharge Planning Concerns Readmission within the last 30 days:  Yes Current discharge risk:  Physical Impairment, Cognitively Impaired Barriers to Discharge:  Continued Medical Work up   [employer], [place] 03/05/2017, 4:55 PM

## 2017-03-05 NOTE — Progress Notes (Signed)
Physical Therapy Treatment Patient Details Name: Kristine Boyer MRN: [id number] DOB: 07-Dec-1940 Today's Date: 03/05/2017    History of Present Illness Patient is a 76 y/o female who presents with AMS, right sided weakness, and difficulty speaking. 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 PMH includes dementia, seizures, mass of oropharynx (2015), GERD, dysphagia, DM, BPPV, bipolar affective.     PT Comments    Pt progressing towards PT goals. Pt again limited secondary to lethargy and aphasia. Pt continues to require physical assist of two with bed mobility and transfers. PT continuing to recommend CIR for intensive therapy services prior to returning home. PT will continue to follow acutely.    Follow Up Recommendations  CIR;Supervision/Assistance - 24 hour     Equipment Recommendations  Other (comment) (defer to next venue)    Recommendations for Other Services       Precautions / Restrictions Precautions Precautions: Fall Precaution Comments: cortrak Restrictions Weight Bearing Restrictions: No    Mobility  Bed Mobility Overal bed mobility: Needs Assistance Bed Mobility: Supine to Sit;Sit to Supine     Supine to sit: Max assist;+2 for physical assistance Sit to supine: Total assist;+2 for physical assistance   General bed mobility comments: increased time, vc'ing for sequencing, pt with inconsistency with following commands, max A x2 to achieve sitting EOB. Total A for all aspects to return to supine  Transfers Overall transfer level: Needs assistance Equipment used: 2 person hand held assist Transfers: Sit to/from Stand Sit to Stand: Mod assist;+2 physical assistance         General transfer comment: Assist to power to standing with cues for initiation and anterior weight shift  Ambulation/Gait             General Gait Details: deferred secondary to arousal    Stairs             Wheelchair Mobility    Modified Rankin (Stroke Patients Only) Modified Rankin (Stroke Patients Only) Pre-Morbid Rankin Score: Moderate disability Modified Rankin: Moderately severe disability     Balance Overall balance assessment: Needs assistance Sitting-balance support: Feet supported;No upper extremity supported Sitting balance-Leahy Scale: Fair Sitting balance - Comments: pt progressing to sit EOB with close min guard   Standing balance support: During functional activity;Bilateral upper extremity supported Standing balance-Leahy Scale: Poor Standing balance comment: Reliant on external support                            Cognition Arousal/Alertness: Lethargic Behavior During Therapy: Flat affect Overall Cognitive Status: Difficult to assess                                 General Comments: pt not verbalizing this session, occasionally nodding to therapist's questions      Exercises      General Comments        Pertinent Vitals/Pain Pain Assessment: Faces Faces Pain Scale: No hurt    Home Living                      Prior Function            PT Goals (current goals can now be found in the care plan section) Acute Rehab PT Goals PT Goal Formulation: Patient unable to participate in  goal setting Time For Goal Achievement: 03/17/17 Potential to Achieve Goals: Fair Progress towards PT goals: Progressing toward goals    Frequency    Min 3X/week      PT Plan Current plan remains appropriate    Co-evaluation              AM-PAC PT "6 Clicks" Daily Activity  Outcome Measure  Difficulty turning over in bed (including adjusting bedclothes, sheets and blankets)?: Total Difficulty moving from lying on back to sitting on the side of the bed? : Total Difficulty sitting down on and standing up from a chair with arms (e.g., wheelchair, bedside commode, etc,.)?: Total Help needed moving to and from a bed to  chair (including a wheelchair)?: A Lot Help needed walking in hospital room?: A Lot Help needed climbing 3-5 steps with a railing? : Total 6 Click Score: 8    End of Session Equipment Utilized During Treatment: Gait belt Activity Tolerance: Patient limited by lethargy Patient left: in bed;with call bell/phone within reach;with bed alarm set Nurse Communication: Mobility status PT Visit Diagnosis: Hemiplegia and hemiparesis;Muscle weakness (generalized) (M62.81);Unsteadiness on feet (R26.81) Hemiplegia - Right/Left: Right Hemiplegia - dominant/non-dominant: Dominant Hemiplegia - caused by: Other Nontraumatic intracranial hemorrhage     Time: 1110-1127 PT Time Calculation (min) (ACUTE ONLY): 17 min  Charges:  $Therapeutic Activity: 8-22 mins                    G Codes:       [place], [place], [place] 03/05/2017, 12:57 PM

## 2017-03-05 NOTE — Care Management Important Message (Signed)
Important Message  Patient Details  Name: Kristine Boyer MRN: [id number] Date of Birth: 1941/07/19   Medicare Important Message Given:  Yes    Vineeth Fell 03/05/2017, 12:00 PM

## 2017-03-05 NOTE — Progress Notes (Signed)
Pt's daughter c/o care and everything staff do for pt. Pt room changed upon daughters complain. Pt daughter complaining new room too small for them because they have big family and they all wont fit in there. Pt daughter also stated "If am to trip and fall in this room, am going to sue". Best boy made aware. Will continue to assess pt. Delia Heady RN

## 2017-03-05 NOTE — NC FL2 (Signed)
[place] LEVEL OF CARE SCREENING TOOL     IDENTIFICATION  Patient Name: Kristine Boyer Birthdate: [birth year] Sex: female Admission Date (Current Location): 02/28/2017  [facility] and [place] Number:  Herbalist and Address:  [facility], [place] [address] [place], [place] [address]      Provider Number: [id number]  Attending Physician Name and Address:  Rosalin Hawking, MD  Relative Name and Phone Number:       Current Level of Care: Hospital Recommended Level of Care: [place] Prior Approval Number:    Date Approved/Denied:   PASRR Number: [id number]  Discharge Plan: SNF    Current Diagnoses: Patient Active Problem List   Diagnosis Date Noted  . Dementia without behavioral disturbance   . Diabetes mellitus type 2 in nonobese (HCC)   . PAF (paroxysmal atrial fibrillation) ([place])   . Benign essential HTN   . Acute on chronic diastolic heart failure ([place])   . Tachypnea   . Hypokalemia   . Acute blood loss anemia   . ICH (intracerebral hemorrhage) ([place]) 03/05/2017  . Wound of skin 02/25/2017  . Unresponsive episode 02/09/2017  . Nausea & vomiting 02/09/2017  . Acute renal insufficiency 02/09/2017  . Air embolism ([place]) 02/09/2017  . Pyuria 02/09/2017  . Subcutaneous emphysema ([place]) 02/08/2017  . Insomnia 12/17/2016  . Nonspecific chest pain   . Atypical atrial flutter ([place])   . Essential hypertension   . Chest pain 12/02/2016  . Incontinence 11/02/2016  . Hearing impairment, Left Ear 11/02/2016  . Dementia with behavioral disturbance 11/02/2016  . Atrial fibrillation ([place]) 06/18/2016  . Angina pectoris, unspecified 08/05/2015  . CN (constipation) 12/17/2014  . Dysphagia 08/28/2013  . Unspecified constipation 07/28/2013  . Forgetfulness 07/18/2013  . Breast nodule 01/03/2013  . Lung mass 08/05/2011  . OSA (obstructive sleep apnea) 06/12/2011  . Bulging disc 06/12/2011  . BPPV (benign paroxysmal  positional vertigo) 05/18/2011  . BACK PAIN, LUMBAR 11/28/2009  . OBESITY 09/11/2008  . Allergic rhinitis 01/19/2008  . Hypothyroidism 11/25/2006  . Diabetes mellitus type 2, uncontrolled ([place]) 11/25/2006  . Hyperlipidemia 11/25/2006  . HYPERTENSION, BENIGN SYSTEMIC 11/25/2006  . GASTROESOPHAGEAL REFLUX, NO ESOPHAGITIS 11/25/2006  . OSTEOARTHRITIS, MULTI SITES 11/25/2006    Orientation RESPIRATION BLADDER Height & Weight     Self  Normal Incontinent Weight: 177 lb 4 oz (80.4 kg) Height:  5\' 3"  (160 cm)  BEHAVIORAL SYMPTOMS/MOOD NEUROLOGICAL BOWEL NUTRITION STATUS      Incontinent Feeding tube, Diet (dysphagia diet)  AMBULATORY STATUS COMMUNICATION OF NEEDS Skin   Extensive Assist Does not communicate (dysarthria; expressive aphasia) Normal                       Personal Care Assistance Level of Assistance  Bathing, Feeding, Dressing Bathing Assistance: Maximum assistance Feeding assistance: Maximum assistance Dressing Assistance: Maximum assistance     Functional Limitations Info             SPECIAL CARE FACTORS FREQUENCY  PT (By licensed PT), OT (By licensed OT), Speech therapy     PT Frequency: 5x/wk OT Frequency: 5x/wk     Speech Therapy Frequency: not specified      Contractures      Additional Factors Info  Code Status, Allergies, Insulin Sliding Scale Code Status Info: full Allergies Info: Lactose Intolerance (Gi), Vicodin Hydrocodone-acetaminophen, Tramadol, Neurontin Gabapentin   Insulin Sliding Scale Info: Every 4 hours  Current Medications (03/05/2017):  This is the current hospital active medication list Current Facility-Administered Medications  Medication Dose Route Frequency Provider Last Rate Last Dose  . 0.9 %  sodium chloride infusion   Intravenous Continuous Rosalin Hawking, MD 20 mL/hr at 03/05/17 [phone]    . amLODipine (NORVASC) tablet 10 mg  10 mg Oral Daily Rosalin Hawking, MD   10 mg at 03/05/17 1325  . feeding supplement (JEVITY 1.2  CAL) liquid 1,000 mL  1,000 mL Per Tube Continuous Rosalin Hawking, MD 55 mL/hr at 03/04/17 1359 1,000 mL at 03/04/17 1359  . feeding supplement (PRO-STAT SUGAR FREE 64) liquid 30 mL  30 mL Per Tube Daily Rosalin Hawking, MD   30 mL at 03/05/17 0950  . heparin injection 5,000 Units  5,000 Units Subcutaneous Q8H Rosalin Hawking, MD   5,000 Units at 03/05/17 1325  . insulin aspart (novoLOG) injection 0-15 Units  0-15 Units Subcutaneous Q4H Darrel Reach, MD   3 Units at 03/05/17 1200  . irbesartan (AVAPRO) tablet 150 mg  150 mg Oral BID Rosalin Hawking, MD   150 mg at 03/05/17 0950  . labetalol (NORMODYNE,TRANDATE) injection 10-20 mg  10-20 mg Intravenous Q10 min PRN Rosalin Hawking, MD   10 mg at 03/04/17 1806  . levothyroxine (SYNTHROID, LEVOTHROID) tablet 50 mcg  50 mcg Oral QAC breakfast Rosalin Hawking, MD   50 mcg at 03/05/17 0950  . MEDLINE mouth rinse  15 mL Mouth Rinse BID Rosalin Hawking, MD   15 mL at 03/05/17 1106  . metoprolol succinate (TOPROL-XL) 24 hr tablet 100 mg  100 mg Oral BID Rosalin Hawking, MD      . pantoprazole sodium (PROTONIX) 40 mg/20 mL oral suspension 40 mg  40 mg Per Tube Daily Rosalin Hawking, MD   40 mg at 03/05/17 1106  . senna-docusate (Senokot-S) tablet 1 tablet  1 tablet Oral BID Marliss Coots, PA-C   1 tablet at 03/05/17 5883     Discharge Medications: Please see discharge summary for a list of discharge medications.  Relevant Imaging Results:  Relevant Lab Results:   Additional Information SS#: [id number]  Geralynn Ochs, LCSW

## 2017-03-05 NOTE — Progress Notes (Signed)
STROKE TEAM PROGRESS NOTE   SUBJECTIVE (INTERVAL HISTORY) Daughter is at bedside. Still on TF. Passed swallow yesterday, on dysphagia I diet with thin liquid, but she was drowsy this am.      OBJECTIVE Temp:  [98.4 F (36.9 C)-99.1 F (37.3 C)] 99.1 F (37.3 C) (06/08 1415) Pulse Rate:  [68-77] 72 (06/08 1415) Cardiac Rhythm: Heart block (06/08 0700) Resp:  [16-20] 18 (06/08 1415) BP: (135-164)/(67-86) 151/75 (06/08 1415) SpO2:  [93 %-100 %] 95 % (06/08 1415)  CBC:  Recent Labs Lab 03/13/2017 0954  03/04/17 0452 03/05/17 0703  WBC 7.8  < > 8.2 8.8  NEUTROABS 5.4  --   --   --   HGB 10.4*  < > 10.3* 10.7*  HCT 32.4*  < > 31.3* 32.0*  MCV 81.6  < > 79.8 80.0  PLT 174  < > 174 161  < > = values in this interval not displayed.  Basic Metabolic Panel:   Recent Labs Lab 03/04/17 0452 03/04/17 1637 03/05/17 0703  NA 137  --  139  K 3.2*  --  3.6  CL 103  --  105  CO2 26  --  25  GLUCOSE 163*  --  171*  BUN 13  --  12  CREATININE 0.87  --  0.72  CALCIUM 8.6*  --  8.7*  MG 2.1 2.1  --   PHOS 3.4 2.9  --     Lipid Panel:     Component Value Date/Time   CHOL 140 12/03/2016 0409   TRIG 46 03/02/2017 1010   HDL 51 12/03/2016 0409   CHOLHDL 2.7 12/03/2016 0409   VLDL 18 12/03/2016 0409   LDLCALC 71 12/03/2016 0409   HgbA1c:  Lab Results  Component Value Date   HGBA1C 11.9 (H) 02/08/2017   Urine Drug Screen:     Component Value Date/Time   LABOPIA NONE DETECTED 03/15/2017 0940   COCAINSCRNUR NONE DETECTED 03/11/2017 0940   LABBENZ NONE DETECTED 03/25/2017 0940   AMPHETMU NONE DETECTED 03/11/2017 0940   THCU NONE DETECTED 03/18/2017 0940   LABBARB NONE DETECTED 03/17/2017 0940    Alcohol Level     Component Value Date/Time   ETH <5 03/09/2017 0954    IMAGING I have personally reviewed the radiological images below and agree with the radiology interpretations.  Ct Head Wo Contrast 03/02/2017 0706 Overall relatively similar exam to yesterday. Left  thalamic 2.2 x 2 x 1.6 cm hematoma with surrounding vasogenic edema minimally changed from the prior examination when this measured 2.1 x 2 x 1.5 cm. Mass-effect upon the left lateral ventricle and third ventricle with 3 mm midline shift to the right. No development of hydrocephalus. 8 mm rounded hyperdensity right occipital lobe (series 5, image 54) unchanged to from 02/08/2017 and possibly related to remote hemorrhage. Chronic microvascular changes. Global atrophy.   Mr Brain Wo Contrast 03/07/2017 2221 1. 2.0 x 1.7 x 1.9 cm acute left thalamic hemorrhage. Associated vasogenic edema with localized 3 mm of left-to-right midline shift. No hydrocephalus or evidence for ventricular trapping. No intraventricular extension. Hypertensive etiology is suspected. 2. Innumerable scattered foci of chronic hemorrhages involving the posterior bilateral cerebral hemispheres, deep gray nuclei, brainstem, and cerebellum. Distribution favors chronic underlying hypertension. 3. Small remote right thalamic lacunar infarcts with additional small remote left cerebellar infarct. 4. Generalized age-related cerebral atrophy with moderate chronic small vessel ischemic disease.   Ct Head Wo Contrast 03/25/2017 1049 2 cm left thalamic hematoma (~3 cc). Associated edema  and local mild mass-effect.   CUS - 02/09/17 - Bilateral: Negative bilateral carotid duplex ultrasound.[name]  TTE - Normal LV size with mild LV hypertrophy. EF 60-65%. Moderate   diastolic dysfunction. Normal RV size and systolic function. No   significant valvular abnormalities. Left atrial enlargement.  Ct Head Wo Contrast 03/04/2017 IMPRESSION: 1. Stable size of left thalamic hemorrhage, measuring 2.2 x 2.0 x 1.7 cm. Associated vasogenic edema is relatively similar with similar mass effect on the adjacent left lateral and third ventricles and stable 3 mm of left-to-right shift. No hydrocephalus or ventricular trapping. 2. No other new acute intracranial process.     PHYSICAL EXAM  Temp:  [98.4 F (36.9 C)-99.1 F (37.3 C)] 99.1 F (37.3 C) (06/08 1415) Pulse Rate:  [68-77] 72 (06/08 1415) Resp:  [16-20] 18 (06/08 1415) BP: (135-164)/(67-86) 151/75 (06/08 1415) SpO2:  [93 %-100 %] 95 % (06/08 1415)  General - Well nourished, well developed, in no apparent distress.  Ophthalmologic - Fundi not visualized due to noncooperation.  Cardiovascular - Regular rate and rhythm, not in afib.  Neuro - awake alert, expressive aphasia > receptive aphasia, following some simple commands, mumbling words, not sensible. Not able to name, but able to repeat with dysarthria and perseveration. No gaze preference, attending to both sides. Blinking to visual threat bilaterally. PERRL. Right nasolabial fold flattening. Tongue midline. LUE and LLE 5/5, RUE drift and 3+/5 proximal and 4/5 distal. RLE 3/5 proximal and 4/5 distal. DTR 1+ and no babinski. Sensation, coordination not cooperative and gait not tested.  ASSESSMENT/PLAN [name] is a 76 y.o. female with history of AF on eliquis, baseline dementia, HTN, HLD, DM, BPPV, bipolar presenting with slurred speech and R hemiparesis. CT showed a L thalamic hemorrhage.   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 Scattered microhemorrhages. 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 Small vessel disease. Atrophy.   Repeat CT x 2 no significant change, no increase in hmg or hydrocephalus  Carotid Doppler  01/2017 Unremarkable   2D Echo  EF 60-65%  LDL 71 in March  HgbA1c 11.9 in May  SCDs for VTE prophylaxis DIET - DYS 1 Room service appropriate? Yes; Fluid consistency: Thin  Eliquis (apixaban) daily prior to admission, now on No antithrombotic given  hemorrhage.  Ongoing aggressive stroke risk factor management  Therapy recommendations:  CIR   Disposition:  pending   Paroxysmal Atrial Fibrillation  Home anticoagulation:  Eliquis (apixaban) daily - last dose 5 days prior to admission  No an AC candidate now d/t hemorrhage  Home metoprolol 200 q 24h  CHA2DS2-VASc Score = at least 7  Age in Years:  ?71   +2    Sex:  Female   Female   +1    Hypertension History:  yes   +1     Diabetes Mellitus:  yes   +1   Congestive Heart Failure History:  0  Vascular Disease History:  0  Stroke/TIA/Thromboembolism History:  yes   +2  Will consider anticoagulation options once blood absorbed.   Hypertension  Home med: diovan and metoprolol  BP stable   SBP goal < 160  Off Cleviprex  Resumed ibersartan and metoprolol  Add amlodipine for better BP control  Hyperlipidemia  Home meds:  lipitor 40  LDL 71 in March, almost at goal < 70  Continue statin at discharge  Diabetes  Home med: glucotrol  HgbA1c 11.9 in May, goal < 7.0  Uncontrolled  SSI  Dysphagia   On dys 1 diet with thin liquid  Baseline dysphagia due to unclear etiology  On TF. Once able to eat more, may consider d/c TF  Speech to follow  Other Stroke Risk Factors  Advanced age  UDS / ETOH level negative   Obesity, Body mass index is 31.4 kg/m., recommend weight loss, diet and exercise as appropriate   Other Active Problems  Baseline dementia - seroquel, desyrel  Bipolar  Hypothyroid on synthroid  Constipation on miralax, senna   Hospital day # 4  Rosalin Hawking, MD PhD Stroke Neurology 03/05/2017 5:37 PM    To contact Stroke Continuity provider, please refer to [email or web address] After hours, contact General Neurology

## 2017-03-05 NOTE — Progress Notes (Signed)
Placed patient on CPAP for the night via auto-mode with minimum pressure set at 6cm and maximum pressure set at 20cm  

## 2017-03-06 LAB — CBC
HCT: 35.1 % — ABNORMAL LOW (ref 36.0–46.0)
Hemoglobin: 11.4 g/dL — ABNORMAL LOW (ref 12.0–15.0)
MCH: 26 pg (ref 26.0–34.0)
MCHC: 32.5 g/dL (ref 30.0–36.0)
MCV: 80.1 fL (ref 78.0–100.0)
PLATELETS: 177 10*3/uL (ref 150–400)
RBC: 4.38 MIL/uL (ref 3.87–5.11)
RDW: 13.2 % (ref 11.5–15.5)
WBC: 10.8 10*3/uL — ABNORMAL HIGH (ref 4.0–10.5)

## 2017-03-06 LAB — BASIC METABOLIC PANEL
Anion gap: 10 (ref 5–15)
BUN: 12 mg/dL (ref 6–20)
CALCIUM: 8.8 mg/dL — AB (ref 8.9–10.3)
CO2: 26 mmol/L (ref 22–32)
CREATININE: 0.79 mg/dL (ref 0.44–1.00)
Chloride: 102 mmol/L (ref 101–111)
GFR calc non Af Amer: >60 mL/min (ref >60)
Glucose, Bld: 183 mg/dL — ABNORMAL HIGH (ref 65–99)
Potassium: 3.2 mmol/L — ABNORMAL LOW (ref 3.5–5.1)
SODIUM: 138 mmol/L (ref 135–145)

## 2017-03-06 LAB — GLUCOSE, CAPILLARY
GLUCOSE-CAPILLARY: 173 mg/dL — AB (ref 65–99)
GLUCOSE-CAPILLARY: 182 mg/dL — AB (ref 65–99)
GLUCOSE-CAPILLARY: 212 mg/dL — AB (ref 65–99)
GLUCOSE-CAPILLARY: 217 mg/dL — AB (ref 65–99)
GLUCOSE-CAPILLARY: 235 mg/dL — AB (ref 65–99)
Glucose-Capillary: 161 mg/dL — ABNORMAL HIGH (ref 65–99)
Glucose-Capillary: 142 mg/dL — ABNORMAL HIGH (ref 65–99)

## 2017-03-06 MED ORDER — ACETAMINOPHEN 160 MG/5ML PO SOLN
325.0000 mg | Freq: Four times a day (QID) | ORAL | Status: DC | PRN
  Administered 2017-03-06 – 2017-03-15 (×6): 325 mg via ORAL
  Filled 2017-03-06 (×6): qty 20.3

## 2017-03-06 NOTE — Progress Notes (Addendum)
  Speech Language Pathology Treatment: Dysphagia  Patient Details Name: Kristine Boyer MRN: [id number] DOB: April 06, 1941 Today's Date: 03/06/2017 Time: [id number] SLP Time Calculation (min) (ACUTE ONLY): 15 min  Assessment / Plan / Recommendation Clinical Impression  Pt seen for dysphagia treatment to evaluate tolerance of dys 1 diet with thin liquids and for pt/caregiver education. Pt lethargic upon SLP entry, pt's daughter present, states she has been assisting with feeding and utilizing recommended strategies including multiple swallows, alternating solids and liquids. Daughter questioning impact of poor oral intake on "thrush," SLP provided education re: swallow physiology and role of frequent oral care in reducing bacterial load. She verbalized understanding. SLP performed oral care, removing stringy white secretions from teeth and palate, thin white coating of lingual surface. Pt briefly alert and responding to commands; SLP provided trials of ice chips x2, which pt manipulates and swallows with verbal cue after brief holding in oral cavity. Completes additional dry swallows x2 with moderate verbal cues. Attempted thin liquid trials via straw, however pt was unable to follow commands sufficient for bolus retrieval. Unable to maintain alertness sufficient for additional trials, therefore no further PO attempted. Continue current rec for dys 1, thin liquids with precautions, feed only when alert. Recommend pt have oral suction available for oral care. SLP will continue to follow.  RN paged SLP at family's request; pt alert and feeding some. SLP provided trials of pureed, thin liquids. With liquids, no overt signs of aspiration and pt completing additional swallows with min verbal, tactile cues. Pt grimaces after bite of pureed solid, discomfort appears to subside with subsequent dry swallow and thin liquid wash. With purees, pt requires extended time to elicit secondary swallow. Provided education to  daughter re: continuing to cue and allow ample time for multiple swallows, liquid wash to facilitate clearance of residue.   HPI HPI: Pt is a 76 yo female admitted with AMS, right sided weakness, and difficulty speaking. 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 It also revealed innumerable scattered foci of chronic hemorrhages, remote R thalamic and L cerebellar infarcts, and generalized cerebral atrophy. PMH includes dementia, seizures, mass of oropharynx (2015), GERD, dysphagia, DM, BPPV, bipolar affective. 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      SLP Plan  Continue with current plan of care       Recommendations  Diet recommendations: Dysphagia 1 (puree);Thin liquid Liquids provided via: Straw Medication Administration: Crushed with puree Supervision: Staff to assist with self feeding;Full supervision/cueing for compensatory strategies Compensations: Slow rate;Small sips/bites;Minimize environmental distractions;Follow solids with liquid;Multiple dry swallows after each bite/sip Postural Changes and/or Swallow Maneuvers: Seated upright 90 degrees;Upright 30-60 min after meal                Oral Care Recommendations: Oral care BID Follow up Recommendations: Inpatient Rehab SLP Visit Diagnosis: Dysphagia, pharyngeal phase (R13.13) Plan: Continue with current plan of care       [place], [place], [place] Pathologist [phone]      Aliene Altes 03/06/2017, 1:18 PM

## 2017-03-06 NOTE — Progress Notes (Signed)
STROKE TEAM PROGRESS NOTE   SUBJECTIVE (INTERVAL HISTORY) Her daughter is at bedside. Still on TF. Passed swallow yesterday, on dysphagia I diet with thin liquid, but she is not eating enough     OBJECTIVE Temp:  [98.6 F (37 C)-99.1 F (37.3 C)] 98.7 F (37.1 C) (06/09 0958) Pulse Rate:  [67-83] 67 (06/09 0958) Cardiac Rhythm: Heart block (06/09 0754) Resp:  [17-20] 18 (06/09 0958) BP: (144-181)/(70-90) 181/70 (06/09 0958) SpO2:  [95 %-100 %] 98 % (06/09 0958)  CBC:  Recent Labs Lab 03/10/2017 0954  03/05/17 0703 03/06/17 0552  WBC 7.8  < > 8.8 10.8*  NEUTROABS 5.4  --   --   --   HGB 10.4*  < > 10.7* 11.4*  HCT 32.4*  < > 32.0* 35.1*  MCV 81.6  < > 80.0 80.1  PLT 174  < > 161 177  < > = values in this interval not displayed.  Basic Metabolic Panel:   Recent Labs Lab 03/04/17 0452 03/04/17 1637 03/05/17 0703 03/06/17 0552  NA 137  --  139 138  K 3.2*  --  3.6 3.2*  CL 103  --  105 102  CO2 26  --  25 26  GLUCOSE 163*  --  171* 183*  BUN 13  --  12 12  CREATININE 0.87  --  0.72 0.79  CALCIUM 8.6*  --  8.7* 8.8*  MG 2.1 2.1  --   --   PHOS 3.4 2.9  --   --     Lipid Panel:     Component Value Date/Time   CHOL 140 12/03/2016 0409   TRIG 46 03/02/2017 1010   HDL 51 12/03/2016 0409   CHOLHDL 2.7 12/03/2016 0409   VLDL 18 12/03/2016 0409   LDLCALC 71 12/03/2016 0409   HgbA1c:  Lab Results  Component Value Date   HGBA1C 11.9 (H) 02/08/2017   Urine Drug Screen:     Component Value Date/Time   LABOPIA NONE DETECTED 03/07/2017 0940   COCAINSCRNUR NONE DETECTED 03/21/2017 0940   LABBENZ NONE DETECTED 03/12/2017 0940   AMPHETMU NONE DETECTED 03/03/2017 0940   THCU NONE DETECTED 02/28/2017 0940   LABBARB NONE DETECTED 03/04/2017 0940    Alcohol Level     Component Value Date/Time   ETH <5 03/26/2017 0954    IMAGING I have personally reviewed the radiological images below and agree with the radiology interpretations.  Ct Head Wo Contrast 03/02/2017  0706 Overall relatively similar exam to yesterday. Left thalamic 2.2 x 2 x 1.6 cm hematoma with surrounding vasogenic edema minimally changed from the prior examination when this measured 2.1 x 2 x 1.5 cm. Mass-effect upon the left lateral ventricle and third ventricle with 3 mm midline shift to the right. No development of hydrocephalus. 8 mm rounded hyperdensity right occipital lobe (series 5, image 54) unchanged to from 02/08/2017 and possibly related to remote hemorrhage. Chronic microvascular changes. Global atrophy.   Mr Brain Wo Contrast 03/04/2017 2221 1. 2.0 x 1.7 x 1.9 cm acute left thalamic hemorrhage. Associated vasogenic edema with localized 3 mm of left-to-right midline shift. No hydrocephalus or evidence for ventricular trapping. No intraventricular extension. Hypertensive etiology is suspected. 2. Innumerable scattered foci of chronic hemorrhages involving the posterior bilateral cerebral hemispheres, deep gray nuclei, brainstem, and cerebellum. Distribution favors chronic underlying hypertension. 3. Small remote right thalamic lacunar infarcts with additional small remote left cerebellar infarct. 4. Generalized age-related cerebral atrophy with moderate chronic small vessel ischemic disease.  Ct Head Wo Contrast 03/12/2017 1049 2 cm left thalamic hematoma (~3 cc). Associated edema and local mild mass-effect.   CUS - 02/09/17 - Bilateral: Negative bilateral carotid duplex ultrasound.[name]  TTE - Normal LV size with mild LV hypertrophy. EF 60-65%. Moderate   diastolic dysfunction. Normal RV size and systolic function. No   significant valvular abnormalities. Left atrial enlargement.  Ct Head Wo Contrast 03/04/2017 IMPRESSION: 1. Stable size of left thalamic hemorrhage, measuring 2.2 x 2.0 x 1.7 cm. Associated vasogenic edema is relatively similar with similar mass effect on the adjacent left lateral and third ventricles and stable 3 mm of left-to-right shift. No hydrocephalus or ventricular  trapping. 2. No other new acute intracranial process.    PHYSICAL EXAM  Temp:  [98.6 F (37 C)-99.1 F (37.3 C)] 98.7 F (37.1 C) (06/09 0958) Pulse Rate:  [67-83] 67 (06/09 0958) Resp:  [17-20] 18 (06/09 0958) BP: (144-181)/(70-90) 181/70 (06/09 0958) SpO2:  [95 %-100 %] 98 % (06/09 0958)  General - Well nourished, well developed, in no apparent distress.  Ophthalmologic - Fundi not visualized due to noncooperation.  Cardiovascular - Regular rate and rhythm, not in afib.  Neuro - drowsy barely opens eyes, expressive aphasia > receptive aphasia, following some simple commands, mumbling words, not sensible. Not able to name, but able to repeat with dysarthria and perseveration. No gaze preference, attending to both sides. Blinking to visual threat bilaterally. PERRL. Right nasolabial fold flattening. Tongue midline. LUE and LLE 5/5, RUE drift and 3+/5 proximal and 4/5 distal. RLE 3/5 proximal and 4/5 distal. DTR 1+ and no babinski. Sensation, coordination not cooperative and gait not tested.  ASSESSMENT/PLAN [name] is a 76 y.o. female with history of AF on eliquis, baseline dementia, HTN, HLD, DM, BPPV, bipolar presenting with slurred speech and R hemiparesis. CT showed a L thalamic hemorrhage.   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 Scattered microhemorrhages. 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 Small vessel disease. Atrophy.   Repeat CT x 2 no significant change, no increase in hmg or hydrocephalus  Carotid Doppler  01/2017 Unremarkable   2D Echo  EF 60-65%  LDL 71 in March  HgbA1c 11.9 in May  SCDs for VTE prophylaxis DIET - DYS 1 Room service appropriate? Yes; Fluid consistency: Thin  Eliquis (apixaban)  daily prior to admission, now on No antithrombotic given hemorrhage.  Ongoing aggressive stroke risk factor management  Therapy recommendations:  CIR   Disposition:  pending   Paroxysmal Atrial Fibrillation  Home anticoagulation:  Eliquis (apixaban) daily - last dose 5 days prior to admission  No an AC candidate now d/t hemorrhage  Home metoprolol 200 q 24h  CHA2DS2-VASc Score = at least 7  Age in Years:  ?87   +2    Sex:  Female   Female   +1    Hypertension History:  yes   +1     Diabetes Mellitus:  yes   +1   Congestive Heart Failure History:  0  Vascular Disease History:  0  Stroke/TIA/Thromboembolism History:  yes   +2  Will consider anticoagulation options once blood absorbed.   Hypertension  Home med: diovan and metoprolol  BP stable   SBP goal < 160  Off Cleviprex  Resumed ibersartan and metoprolol  Add amlodipine for better BP control  Hyperlipidemia  Home meds:  lipitor 40  LDL 71 in March, almost at goal < 70  Continue statin at discharge  Diabetes  Home med: glucotrol  HgbA1c 11.9 in May, goal < 7.0  Uncontrolled  SSI  Dysphagia   On dys 1 diet with thin liquid  Baseline dysphagia due to unclear etiology  On TF. Once able to eat more, may consider d/c TF  Speech to follow  Other Stroke Risk Factors  Advanced age  UDS / ETOH level negative   Obesity, Body mass index is 31.4 kg/m., recommend weight loss, diet and exercise as appropriate   Other Active Problems  Baseline dementia - seroquel, desyrel  Bipolar  Hypothyroid on synthroid  Constipation on miralax, senna   Hospital day # 5 Long d/w daughter at bedside about her ICH, AFIB and stroke risk. Plan for rehab transfer early next week. Greater than 50 percent time during this 25 minute visit was spent on counseling and coordination of care about her brain hemorrhage, atrial fibrillation, disposition discussion and answering questions Antony Contras, MD Stroke  Neurology 03/06/2017 11:46 AM    To contact Stroke Continuity provider, please refer to [email or web address] After hours, contact General Neurology

## 2017-03-06 NOTE — Progress Notes (Signed)
Placed patient on CPAP for the night via auto-mode.  

## 2017-03-07 ENCOUNTER — Inpatient Hospital Stay (HOSPITAL_COMMUNITY): Payer: Medicare HMO

## 2017-03-07 LAB — GLUCOSE, CAPILLARY
GLUCOSE-CAPILLARY: 172 mg/dL — AB (ref 65–99)
GLUCOSE-CAPILLARY: 200 mg/dL — AB (ref 65–99)
GLUCOSE-CAPILLARY: 210 mg/dL — AB (ref 65–99)
GLUCOSE-CAPILLARY: 265 mg/dL — AB (ref 65–99)

## 2017-03-07 LAB — BASIC METABOLIC PANEL
ANION GAP: 8 (ref 5–15)
BUN: 14 mg/dL (ref 6–20)
CALCIUM: 8.8 mg/dL — AB (ref 8.9–10.3)
CHLORIDE: 100 mmol/L — AB (ref 101–111)
CO2: 28 mmol/L (ref 22–32)
Creatinine, Ser: 0.77 mg/dL (ref 0.44–1.00)
GFR calc non Af Amer: >60 mL/min (ref >60)
GLUCOSE: 179 mg/dL — AB (ref 65–99)
Potassium: 3.3 mmol/L — ABNORMAL LOW (ref 3.5–5.1)
Sodium: 136 mmol/L (ref 135–145)

## 2017-03-07 LAB — CBC
HEMATOCRIT: 34.3 % — AB (ref 36.0–46.0)
HEMOGLOBIN: 11.5 g/dL — AB (ref 12.0–15.0)
MCH: 26.7 pg (ref 26.0–34.0)
MCHC: 33.5 g/dL (ref 30.0–36.0)
MCV: 79.6 fL (ref 78.0–100.0)
Platelets: 175 10*3/uL (ref 150–400)
RBC: 4.31 MIL/uL (ref 3.87–5.11)
RDW: 13.1 % (ref 11.5–15.5)
WBC: 9.9 10*3/uL (ref 4.0–10.5)

## 2017-03-07 MED ORDER — POTASSIUM CHLORIDE 20 MEQ PO PACK
20.0000 meq | PACK | Freq: Two times a day (BID) | ORAL | Status: DC
  Administered 2017-03-07 (×2): 20 meq via ORAL
  Filled 2017-03-07 (×3): qty 1

## 2017-03-07 MED ORDER — METOCLOPRAMIDE HCL 5 MG/ML IJ SOLN
10.0000 mg | Freq: Once | INTRAMUSCULAR | Status: AC
  Administered 2017-03-07: 10 mg via INTRAVENOUS
  Filled 2017-03-07: qty 2

## 2017-03-07 NOTE — Progress Notes (Signed)
STROKE TEAM PROGRESS NOTE   SUBJECTIVE (INTERVAL HISTORY) Family members at the bedside. The patient's daughter reports that the patient has had pain and swelling of the left hand. Per Dr. Leonie Man will x-ray.   OBJECTIVE Temp:  [97.5 F (36.4 C)-100 F (37.8 C)] 98.2 F (36.8 C) (06/10 0438) Pulse Rate:  [62-83] 62 (06/10 0438) Cardiac Rhythm: Heart block (06/10 0700) Resp:  [16-20] 20 (06/10 0438) BP: (142-166)/(71-93) 157/75 (06/10 0438) SpO2:  [97 %-100 %] 100 % (06/10 0438)  CBC:  Recent Labs Lab 02/28/2017 0954  03/06/17 0552 03/07/17 0331  WBC 7.8  < > 10.8* 9.9  NEUTROABS 5.4  --   --   --   HGB 10.4*  < > 11.4* 11.5*  HCT 32.4*  < > 35.1* 34.3*  MCV 81.6  < > 80.1 79.6  PLT 174  < > 177 175  < > = values in this interval not displayed.  Basic Metabolic Panel:   Recent Labs Lab 03/04/17 0452 03/04/17 1637  03/06/17 0552 03/07/17 0331  NA 137  --   < > 138 136  K 3.2*  --   < > 3.2* 3.3*  CL 103  --   < > 102 100*  CO2 26  --   < > 26 28  GLUCOSE 163*  --   < > 183* 179*  BUN 13  --   < > 12 14  CREATININE 0.87  --   < > 0.79 0.77  CALCIUM 8.6*  --   < > 8.8* 8.8*  MG 2.1 2.1  --   --   --   PHOS 3.4 2.9  --   --   --   < > = values in this interval not displayed.  Lipid Panel:     Component Value Date/Time   CHOL 140 12/03/2016 0409   TRIG 46 03/02/2017 1010   HDL 51 12/03/2016 0409   CHOLHDL 2.7 12/03/2016 0409   VLDL 18 12/03/2016 0409   LDLCALC 71 12/03/2016 0409   HgbA1c:  Lab Results  Component Value Date   HGBA1C 11.9 (H) 02/08/2017   Urine Drug Screen:     Component Value Date/Time   LABOPIA NONE DETECTED 03/05/2017 0940   COCAINSCRNUR NONE DETECTED 03/23/2017 0940   LABBENZ NONE DETECTED 03/08/2017 0940   AMPHETMU NONE DETECTED 03/17/2017 0940   THCU NONE DETECTED 03/11/2017 0940   LABBARB NONE DETECTED 03/26/2017 0940    Alcohol Level     Component Value Date/Time   ETH <5 03/11/2017 0954    IMAGING  I have personally  reviewed the radiological images below and agree with the radiology interpretations.  Ct Head Wo Contrast 03/02/2017 0706 Overall relatively similar exam to yesterday. Left thalamic 2.2 x 2 x 1.6 cm hematoma with surrounding vasogenic edema minimally changed from the prior examination when this measured 2.1 x 2 x 1.5 cm. Mass-effect upon the left lateral ventricle and third ventricle with 3 mm midline shift to the right. No development of hydrocephalus. 8 mm rounded hyperdensity right occipital lobe (series 5, image 54) unchanged to from 02/08/2017 and possibly related to remote hemorrhage. Chronic microvascular changes. Global atrophy.   Mr Brain Wo Contrast 03/15/2017 2221 1. 2.0 x 1.7 x 1.9 cm acute left thalamic hemorrhage. Associated vasogenic edema with localized 3 mm of left-to-right midline shift. No hydrocephalus or evidence for ventricular trapping. No intraventricular extension. Hypertensive etiology is suspected. 2. Innumerable scattered foci of chronic hemorrhages involving the posterior bilateral  cerebral hemispheres, deep gray nuclei, brainstem, and cerebellum. Distribution favors chronic underlying hypertension. 3. Small remote right thalamic lacunar infarcts with additional small remote left cerebellar infarct. 4. Generalized age-related cerebral atrophy with moderate chronic small vessel ischemic disease.   Ct Head Wo Contrast 03/22/2017 1049 2 cm left thalamic hematoma (~3 cc). Associated edema and local mild mass-effect.   CUS - 02/09/17 - Bilateral: Negative bilateral carotid duplex ultrasound.[name]  TTE - Normal LV size with mild LV hypertrophy. EF 60-65%. Moderate   diastolic dysfunction. Normal RV size and systolic function. No   significant valvular abnormalities. Left atrial enlargement.  Ct Head Wo Contrast 03/04/2017 IMPRESSION: 1. Stable size of left thalamic hemorrhage, measuring 2.2 x 2.0 x 1.7 cm. Associated vasogenic edema is relatively similar with similar mass effect on  the adjacent left lateral and third ventricles and stable 3 mm of left-to-right shift. No hydrocephalus or ventricular trapping. 2. No other new acute intracranial process.   Ct Head Wo Contrast 03/05/2017 1. Continued stable hemorrhage centered at the left ventral thalamus with estimated blood volume of 5 mL. Surrounding edema and mild regional mass effect are stable. 2. No new acute intracranial abnormality.   DG Left Hand Complete 03/07/2017 No acute abnormality.   PHYSICAL EXAM  Temp:  [97.5 F (36.4 C)-100 F (37.8 C)] 98.2 F (36.8 C) (06/10 0438) Pulse Rate:  [62-83] 62 (06/10 0438) Resp:  [16-20] 20 (06/10 0438) BP: (142-166)/(71-93) 157/75 (06/10 0438) SpO2:  [97 %-100 %] 100 % (06/10 0438)  General - Well nourished, well developed, in no apparent distress.  Ophthalmologic - Fundi not visualized due to noncooperation.  Cardiovascular - Regular rate and rhythm, not in afib.  Neuro - drowsy barely opens eyes, expressive aphasia > receptive aphasia, following some simple commands, mumbling words, not sensible. Not able to name, but able to repeat with dysarthria and perseveration. No gaze preference, attending to both sides. Blinking to visual threat bilaterally. PERRL. Right nasolabial fold flattening. Tongue midline. LUE and LLE 5/5, RUE drift and 3+/5 proximal and 4/5 distal. RLE 3/5 proximal and 4/5 distal. DTR 1+ and no babinski. Sensation, coordination not cooperative and gait not tested.  ASSESSMENT/PLAN Kristine Boyer is a 76 y.o. female with history of AF on eliquis, baseline dementia, HTN, HLD, DM, BPPV, bipolar presenting with slurred speech and R hemiparesis. CT showed a L thalamic hemorrhage.   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 Scattered microhemorrhages. 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 Small vessel disease. Atrophy.   Repeat CT x 2 no significant change, no increase in hmg or hydrocephalus  Carotid Doppler  01/2017 Unremarkable   2D Echo  EF 60-65%  LDL 71 in March  HgbA1c 11.9 in May  SCDs for VTE prophylaxis DIET - DYS 1 Room service appropriate? Yes; Fluid consistency: Thin  Eliquis (apixaban) daily prior to admission, now on No antithrombotic given hemorrhage.  Ongoing aggressive stroke risk factor management  Therapy recommendations:  CIR   Disposition:  pending   Paroxysmal Atrial Fibrillation  Home anticoagulation:  Eliquis (apixaban) daily - last dose 5 days prior to admission  No an AC candidate now d/t hemorrhage  Home metoprolol 200 q 24h  CHA2DS2-VASc Score = at least 7  Age in Years:  ?6   +2    Sex:  Female   Female   +1    Hypertension History:  yes   +1     Diabetes Mellitus:  yes   +1   Congestive Heart Failure History:  0  Vascular Disease History:  0  Stroke/TIA/Thromboembolism History:  yes   +2  Will consider anticoagulation options once blood absorbed.   Hypertension  Home med: diovan and metoprolol  BP stable   SBP goal < 160  Off Cleviprex  Resumed ibersartan and metoprolol  Add amlodipine for better BP control  Continue to monitor  Hyperlipidemia  Home meds:  lipitor 40  LDL 71 in March, almost at goal < 70  Continue statin at discharge  Diabetes  Home med: glucotrol  HgbA1c 11.9 in May, goal < 7.0  Uncontrolled  SSI  Dysphagia   On dys 1 diet with thin liquid  Baseline dysphagia due to unclear etiology  On TF. Once able to eat more, may consider d/c TF  Speech to follow  Other Stroke Risk Factors  Advanced age  UDS / ETOH level negative   Obesity, Body mass index is 31.4 kg/m., recommend weight loss, diet and exercise as appropriate   Other Active Problems  Baseline dementia - seroquel,  desyrel  Bipolar  Hypothyroid on synthroid  Constipation on miralax, senna   Left hand pain and swelling -> no abnormality on x-ray.  Hypokalemia - 3.3 - supplement - recheck Tuesday  Mild anemia - stable  Axillary temp 100 yesterday - 98.2 today  PLAN  CIR - possibly Monday  Xray left hand   Mikey Bussing PA-C [facility] Pager [phone] 03/07/2017, 1:38 PM   Hospital day # 6 Long d/w family bedside about her ICH, AFIB and stroke risk. Plan for rehab transfer early next week.Check xray left hand and NSAIDs for pain and swelling Greater than 50 percent time during this 25 minute visit was spent on counseling and coordination of care about her brain hemorrhage, atrial fibrillation, disposition discussion and answering questions  Antony Contras, MD Medical Director [place] Pager: [phone] 03/07/2017 2:27 PM  To contact Stroke Continuity provider, please refer to [email or web address] After hours, contact General Neurology

## 2017-03-07 NOTE — Progress Notes (Signed)
Daughter called nurse into room and said mother was having headache pain and was crying;  Mother was not crying when nurse was in room, nurse stayed and noticed patient grabbing for right side of head, kicking out her leg and grimacing.  Completed another NIH, no change.  Daughter concerned, said, "Dr. Erlinda Hong told me to watch for headaches she could be having another hemorrhage!"  Educated patient family and included them in phone call to Dr. Leonel Ramsay.  Checked BP was slightly elevated, gave prn labetalol. Gave 10mg  of Reglan for HA.  Will continue to monitor patient.

## 2017-03-08 ENCOUNTER — Inpatient Hospital Stay (HOSPITAL_COMMUNITY): Payer: Medicare HMO | Admitting: Critical Care Medicine

## 2017-03-08 ENCOUNTER — Inpatient Hospital Stay (HOSPITAL_COMMUNITY): Payer: Medicare HMO

## 2017-03-08 ENCOUNTER — Other Ambulatory Visit: Payer: Self-pay | Admitting: Internal Medicine

## 2017-03-08 ENCOUNTER — Encounter (HOSPITAL_COMMUNITY): Payer: Self-pay | Admitting: *Deleted

## 2017-03-08 DIAGNOSIS — Z7189 Other specified counseling: Secondary | ICD-10-CM

## 2017-03-08 DIAGNOSIS — J690 Pneumonitis due to inhalation of food and vomit: Secondary | ICD-10-CM

## 2017-03-08 DIAGNOSIS — J9601 Acute respiratory failure with hypoxia: Secondary | ICD-10-CM

## 2017-03-08 LAB — BLOOD GAS, ARTERIAL
ACID-BASE EXCESS: 2.3 mmol/L — AB (ref 0.0–2.0)
Bicarbonate: 27.3 mmol/L (ref 20.0–28.0)
Drawn by: 441371
FIO2: 100
LHR: 18 {breaths}/min
O2 SAT: 99.6 %
PATIENT TEMPERATURE: 98.2
PCO2 ART: 49.5 mmHg — AB (ref 32.0–48.0)
PEEP/CPAP: 5 cmH2O
PH ART: 7.36 (ref 7.350–7.450)
VT: 420 mL
pO2, Arterial: 440 mmHg — ABNORMAL HIGH (ref 83.0–108.0)

## 2017-03-08 LAB — GLUCOSE, CAPILLARY
GLUCOSE-CAPILLARY: 205 mg/dL — AB (ref 65–99)
GLUCOSE-CAPILLARY: 148 mg/dL — AB (ref 65–99)
GLUCOSE-CAPILLARY: 244 mg/dL — AB (ref 65–99)
GLUCOSE-CAPILLARY: 253 mg/dL — AB (ref 65–99)
GLUCOSE-CAPILLARY: 158 mg/dL — AB (ref 65–99)
Glucose-Capillary: 101 mg/dL — ABNORMAL HIGH (ref 65–99)

## 2017-03-08 LAB — MRSA PCR SCREENING: MRSA BY PCR: POSITIVE — AB

## 2017-03-08 MED ORDER — NICARDIPINE HCL IN NACL 20-0.86 MG/200ML-% IV SOLN
3.0000 mg/h | INTRAVENOUS | Status: DC
  Administered 2017-03-08: 5 mg/h via INTRAVENOUS
  Filled 2017-03-08: qty 200

## 2017-03-08 MED ORDER — DIPHENHYDRAMINE HCL 50 MG/ML IJ SOLN
INTRAMUSCULAR | Status: AC
  Filled 2017-03-08: qty 1

## 2017-03-08 MED ORDER — MIDAZOLAM HCL 2 MG/2ML IJ SOLN
INTRAMUSCULAR | Status: AC
  Administered 2017-03-08: 2 mg
  Filled 2017-03-08: qty 2

## 2017-03-08 MED ORDER — PROPOFOL 1000 MG/100ML IV EMUL
5.0000 ug/kg/min | INTRAVENOUS | Status: DC
  Administered 2017-03-08 (×2): 10 ug/kg/min via INTRAVENOUS
  Filled 2017-03-08 (×2): qty 100

## 2017-03-08 MED ORDER — MUPIROCIN 2 % EX OINT
1.0000 "application " | TOPICAL_OINTMENT | Freq: Two times a day (BID) | CUTANEOUS | Status: AC
  Administered 2017-03-08 – 2017-03-12 (×10): 1 via NASAL
  Filled 2017-03-08 (×2): qty 22

## 2017-03-08 MED ORDER — LEVOTHYROXINE SODIUM 50 MCG PO TABS
50.0000 ug | ORAL_TABLET | Freq: Every day | ORAL | Status: DC
  Administered 2017-03-09 – 2017-03-14 (×6): 50 ug
  Filled 2017-03-08 (×7): qty 1

## 2017-03-08 MED ORDER — IPRATROPIUM-ALBUTEROL 0.5-2.5 (3) MG/3ML IN SOLN
3.0000 mL | Freq: Four times a day (QID) | RESPIRATORY_TRACT | Status: DC
  Administered 2017-03-08 – 2017-03-12 (×17): 3 mL via RESPIRATORY_TRACT
  Filled 2017-03-08 (×16): qty 3

## 2017-03-08 MED ORDER — POTASSIUM CHLORIDE 20 MEQ/15ML (10%) PO SOLN
40.0000 meq | Freq: Two times a day (BID) | ORAL | Status: AC
  Administered 2017-03-08 – 2017-03-09 (×4): 40 meq
  Filled 2017-03-08 (×4): qty 30

## 2017-03-08 MED ORDER — ALBUTEROL SULFATE (2.5 MG/3ML) 0.083% IN NEBU
2.5000 mg | INHALATION_SOLUTION | RESPIRATORY_TRACT | Status: DC | PRN
  Administered 2017-03-08: 2.5 mg via RESPIRATORY_TRACT
  Filled 2017-03-08: qty 3

## 2017-03-08 MED ORDER — IRBESARTAN 150 MG PO TABS
150.0000 mg | ORAL_TABLET | Freq: Two times a day (BID) | ORAL | Status: DC
  Administered 2017-03-08 – 2017-03-14 (×13): 150 mg
  Filled 2017-03-08 (×13): qty 1

## 2017-03-08 MED ORDER — RACEPINEPHRINE HCL 2.25 % IN NEBU
0.5000 mL | INHALATION_SOLUTION | Freq: Once | RESPIRATORY_TRACT | Status: DC
  Filled 2017-03-08: qty 0.5

## 2017-03-08 MED ORDER — PROPOFOL 10 MG/ML IV BOLUS
INTRAVENOUS | Status: DC | PRN
  Administered 2017-03-08: 20 mg via INTRAVENOUS

## 2017-03-08 MED ORDER — CHLORHEXIDINE GLUCONATE 0.12% ORAL RINSE (MEDLINE KIT)
15.0000 mL | Freq: Two times a day (BID) | OROMUCOSAL | Status: DC
  Administered 2017-03-08 – 2017-03-12 (×9): 15 mL via OROMUCOSAL

## 2017-03-08 MED ORDER — SUCCINYLCHOLINE CHLORIDE 20 MG/ML IJ SOLN
INTRAMUSCULAR | Status: DC | PRN
  Administered 2017-03-08: 80 mg via INTRAVENOUS

## 2017-03-08 MED ORDER — METOPROLOL TARTRATE 25 MG/10 ML ORAL SUSPENSION
100.0000 mg | Freq: Two times a day (BID) | ORAL | Status: DC
Start: 2017-03-08 — End: 2017-03-15
  Administered 2017-03-08 – 2017-03-14 (×13): 100 mg
  Filled 2017-03-08 (×15): qty 40

## 2017-03-08 MED ORDER — POTASSIUM CHLORIDE 20 MEQ/15ML (10%) PO SOLN: 20.0000 meq | Freq: Two times a day (BID) | ORAL | Status: DC

## 2017-03-08 MED ORDER — CHLORHEXIDINE GLUCONATE CLOTH 2 % EX PADS
6.0000 | MEDICATED_PAD | Freq: Every day | CUTANEOUS | Status: AC
  Administered 2017-03-08 – 2017-03-12 (×5): 6 via TOPICAL

## 2017-03-08 MED ORDER — ORAL CARE MOUTH RINSE
15.0000 mL | Freq: Four times a day (QID) | OROMUCOSAL | Status: DC
  Administered 2017-03-08 – 2017-03-12 (×19): 15 mL via OROMUCOSAL

## 2017-03-08 MED ORDER — FENTANYL 2500MCG IN NS 250ML (10MCG/ML) PREMIX INFUSION
25.0000 ug/h | INTRAVENOUS | Status: DC
  Administered 2017-03-08: 25 ug/h via INTRAVENOUS
  Administered 2017-03-10: 100 ug/h via INTRAVENOUS
  Filled 2017-03-08 (×2): qty 250

## 2017-03-08 MED ORDER — FENTANYL CITRATE (PF) 100 MCG/2ML IJ SOLN
INTRAMUSCULAR | Status: AC
  Administered 2017-03-08: 100 ug
  Filled 2017-03-08: qty 2

## 2017-03-08 NOTE — Progress Notes (Signed)
Discussed with patient's daughter at the bedside and decision was made to forgo CPAP therapy for the night due to NGT and patient agitation.

## 2017-03-08 NOTE — Progress Notes (Signed)
SLP Cancellation Note  Patient Details Name: Kristine Boyer MRN: [id number] DOB: 1941-04-14   Cancelled treatment:       Reason Eval/Treat Not Completed: Medical issues which prohibited therapy.  Pt transferred to ICU, intubated.    [name] 03/08/2017, 3:12 PM

## 2017-03-08 NOTE — Progress Notes (Signed)
STROKE TEAM PROGRESS NOTE   SUBJECTIVE (INTERVAL HISTORY) Patient developed respiratory distress early this morning and was not moving enough air and got emergently intubated. She was seen by Dr. Leonel Ramsay and neurological exam was found to be unchanged. Patient's daughter and son are at the bedside. OBJECTIVE Temp:  [98.1 F (36.7 C)-98.6 F (37 C)] 98.2 F (36.8 C) (06/11 0835) Pulse Rate:  [70-82] 80 (06/11 1145) Cardiac Rhythm: Junctional rhythm;Bundle branch block (06/10 1958) Resp:  [16-20] 18 (06/11 1145) BP: (103-184)/(59-100) 120/69 (06/11 1145) SpO2:  [99 %-100 %] 100 % (06/11 1145) FiO2 (%):  [50 %-100 %] 50 % (06/11 1100) Weight:  [179 lb 12.8 oz (81.6 kg)] 179 lb 12.8 oz (81.6 kg) (06/11 0231)  CBC:   Recent Labs Lab 03/06/17 0552 03/07/17 0331  WBC 10.8* 9.9  HGB 11.4* 11.5*  HCT 35.1* 34.3*  MCV 80.1 79.6  PLT 177 213    Basic Metabolic Panel:   Recent Labs Lab 03/04/17 0452 03/04/17 1637  03/06/17 0552 03/07/17 0331  NA 137  --   < > 138 136  K 3.2*  --   < > 3.2* 3.3*  CL 103  --   < > 102 100*  CO2 26  --   < > 26 28  GLUCOSE 163*  --   < > 183* 179*  BUN 13  --   < > 12 14  CREATININE 0.87  --   < > 0.79 0.77  CALCIUM 8.6*  --   < > 8.8* 8.8*  MG 2.1 2.1  --   --   --   PHOS 3.4 2.9  --   --   --   < > = values in this interval not displayed.  Lipid Panel:     Component Value Date/Time   CHOL 140 12/03/2016 0409   TRIG 46 03/02/2017 1010   HDL 51 12/03/2016 0409   CHOLHDL 2.7 12/03/2016 0409   VLDL 18 12/03/2016 0409   LDLCALC 71 12/03/2016 0409   HgbA1c:  Lab Results  Component Value Date   HGBA1C 11.9 (H) 02/08/2017   Urine Drug Screen:     Component Value Date/Time   LABOPIA NONE DETECTED 03/05/2017 0940   COCAINSCRNUR NONE DETECTED 02/26/2017 0940   LABBENZ NONE DETECTED 03/24/2017 0940   AMPHETMU NONE DETECTED 03/22/2017 0940   THCU NONE DETECTED 03/02/2017 0940   LABBARB NONE DETECTED 03/18/2017 0940    Alcohol  Level     Component Value Date/Time   ETH <5 03/25/2017 0954    IMAGING  I have personally reviewed the radiological images below and agree with the radiology interpretations.  Ct Head Wo Contrast 03/02/2017 0706 Overall relatively similar exam to yesterday. Left thalamic 2.2 x 2 x 1.6 cm hematoma with surrounding vasogenic edema minimally changed from the prior examination when this measured 2.1 x 2 x 1.5 cm. Mass-effect upon the left lateral ventricle and third ventricle with 3 mm midline shift to the right. No development of hydrocephalus. 8 mm rounded hyperdensity right occipital lobe (series 5, image 54) unchanged to from 02/08/2017 and possibly related to remote hemorrhage. Chronic microvascular changes. Global atrophy.   Mr Brain Wo Contrast 03/07/2017 2221 1. 2.0 x 1.7 x 1.9 cm acute left thalamic hemorrhage. Associated vasogenic edema with localized 3 mm of left-to-right midline shift. No hydrocephalus or evidence for ventricular trapping. No intraventricular extension. Hypertensive etiology is suspected. 2. Innumerable scattered foci of chronic hemorrhages involving the posterior bilateral cerebral hemispheres, deep  gray nuclei, brainstem, and cerebellum. Distribution favors chronic underlying hypertension. 3. Small remote right thalamic lacunar infarcts with additional small remote left cerebellar infarct. 4. Generalized age-related cerebral atrophy with moderate chronic small vessel ischemic disease.   Ct Head Wo Contrast 03/12/2017 1049 2 cm left thalamic hematoma (~3 cc). Associated edema and local mild mass-effect.   CUS - 02/09/17 - Bilateral: Negative bilateral carotid duplex ultrasound.[name]  TTE - Normal LV size with mild LV hypertrophy. EF 60-65%. Moderate   diastolic dysfunction. Normal RV size and systolic function. No   significant valvular abnormalities. Left atrial enlargement.  Ct Head Wo Contrast 03/04/2017 IMPRESSION: 1. Stable size of left thalamic hemorrhage, measuring  2.2 x 2.0 x 1.7 cm. Associated vasogenic edema is relatively similar with similar mass effect on the adjacent left lateral and third ventricles and stable 3 mm of left-to-right shift. No hydrocephalus or ventricular trapping. 2. No other new acute intracranial process.   Ct Head Wo Contrast 03/05/2017 1. Continued stable hemorrhage centered at the left ventral thalamus with estimated blood volume of 5 mL. Surrounding edema and mild regional mass effect are stable. 2. No new acute intracranial abnormality.   DG Left Hand Complete 03/07/2017 No acute abnormality.   PHYSICAL EXAM  Temp:  [98.1 F (36.7 C)-98.6 F (37 C)] 98.2 F (36.8 C) (06/11 0835) Pulse Rate:  [70-82] 80 (06/11 1145) Resp:  [16-20] 18 (06/11 1145) BP: (103-184)/(59-100) 120/69 (06/11 1145) SpO2:  [99 %-100 %] 100 % (06/11 1145) FiO2 (%):  [50 %-100 %] 50 % (06/11 1100) Weight:  [179 lb 12.8 oz (81.6 kg)] 179 lb 12.8 oz (81.6 kg) (06/11 0231)  General - frail elderly African-American lady, who is now intubated  Ophthalmologic - Fundi not visualized due to noncooperation.  Cardiovascular - Regular rate and rhythm, not in afib.  Neuro - drowsy barely opens eyes, expressive aphasia > receptive aphasia, not following any commands,   No gaze preference, attending to both sides. Blinking to visual threat bilaterally. PERRL. Right nasolabial fold flattening. Tongue midline. LUE and LLE 5/5, RUE drift and 3+/5 proximal and 4/5 distal. RLE 3/5 proximal and 4/5 distal. DTR 1+ and no babinski. Sensation, coordination not cooperative and gait not tested.  ASSESSMENT/PLAN Kristine Boyer is a 76 y.o. female with history of AF on eliquis, baseline dementia, HTN, HLD, DM, BPPV, bipolar presenting with slurred speech and R hemiparesis. CT showed a L thalamic hemorrhage.   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 Scattered microhemorrhages. 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 Small vessel disease. Atrophy.   Repeat CT x 2 no significant change, no increase in hmg or hydrocephalus  Carotid Doppler  01/2017 Unremarkable   2D Echo  EF 60-65%  LDL 71 in March  HgbA1c 11.9 in May  SCDs for VTE prophylaxis DIET - DYS 1 Room service appropriate? Yes; Fluid consistency: Thin  Eliquis (apixaban) daily prior to admission, now on No antithrombotic given hemorrhage.  Ongoing aggressive stroke risk factor management  Therapy recommendations:  CIR   Disposition:  pending   Paroxysmal Atrial Fibrillation  Home anticoagulation:  Eliquis (apixaban) daily - last dose 5 days prior to admission  No an AC candidate now d/t hemorrhage  Home metoprolol 200 q  24h  CHA2DS2-VASc Score = at least 7  Age in Years:  ?45   +2    Sex:  Female   Female   +1    Hypertension History:  yes   +1     Diabetes Mellitus:  yes   +1   Congestive Heart Failure History:  0  Vascular Disease History:  0  Stroke/TIA/Thromboembolism History:  yes   +2  Will consider anticoagulation options once blood absorbed.   Hypertension  Home med: diovan and metoprolol  BP stable   SBP goal < 160  Off Cleviprex  Resumed ibersartan and metoprolol  Add amlodipine for better BP control  Continue to monitor  Hyperlipidemia  Home meds:  lipitor 40  LDL 71 in March, almost at goal < 70  Continue statin at discharge  Diabetes  Home med: glucotrol  HgbA1c 11.9 in May, goal < 7.0  Uncontrolled  SSI  Dysphagia   On dys 1 diet with thin liquid  Baseline dysphagia due to unclear etiology  On TF. Once able to eat more, may consider d/c TF  Speech to follow  Other Stroke Risk Factors  Advanced age  UDS / ETOH level negative   Obesity, Body mass index is 31.85  kg/m., recommend weight loss, diet and exercise as appropriate   Other Active Problems  Baseline dementia - seroquel, desyrel  Bipolar  Hypothyroid on synthroid  Constipation on miralax, senna   Left hand pain and swelling -> no abnormality on x-ray. Likely osteoarthritis  Hypokalemia - 3.3 - supplement - recheck Tuesday  Mild anemia - stable   PLAN  Continue ventilatory support for the next few days and treat for presumed aspiration pneumonia. Doubt this is primary neurological worsening. Repeat CT scan of the head shows no acute change and stable appearance of hemorrhage.   Hospital day # 7   Long d/w son and daughter  bedside about her ICH, AFIB ,stroke risk and new resp distress requiring intubation. He is realistic and did not want prolonged ventilatory support, tracheostomy. We will continue supportive care for the next few days and see how she responds. Discuss with critical care PA Georgann Housekeeper. This patient is critically ill and at significant risk of neurological worsening, death and care requires constant monitoring of vital signs, hemodynamics,respiratory and cardiac monitoring, extensive review of multiple databases, frequent neurological assessment, discussion with family, other specialists and medical decision making of high complexity.I have made any additions or clarifications directly to the above note.This critical care time does not reflect procedure time, or teaching time or supervisory time of PA/NP/Med Resident etc but could involve care discussion time.  I spent 35 minutes of neurocritical care time  in the care of  this patient.     Antony Contras, MD Medical Director [facility] Pager: [phone] 03/08/2017 11:58 AM  To contact Stroke Continuity provider, please refer to [email or web address] After hours, contact General Neurology

## 2017-03-08 NOTE — Consult Note (Signed)
PULMONARY / CRITICAL CARE MEDICINE   Name: Kristine Boyer MRN: [id number] DOB: [birth year]    ADMISSION DATE:  03/18/2017 CONSULTATION DATE:  03/08/2017  REFERRING MD:  Dr. Leonie Man  CHIEF COMPLAINT:  Respiratory distress  HISTORY OF PRESENT ILLNESS:   76 year old female with PMH as below, which is significant for dementia, bipolar disorder, DM, HTN, and hypothyroid. At baseline she requires assisstance with multiple ADLs and is able to feed/dress herself. She cannot prepare the food or pick out her clothes.  She was admitted 6/4 to [facility] with [place] after being found aphasic with R weakness upon awakening. She is on Eliquis for history of Afib and CT demonstrated left thalamic hemorrhage. She was admitted to the stroke service. Bleed was thought to be hypertensive in nature.   Since admission she had been improving. Resultant expressive aphasia and R hemiparesis persisted, but she was able to be started back on her home mediactions and was able to take PO with a restricted diet. Course not complicated by AF or uncontrolled DM. She was preparing for discharge to SNF early in the week of 6/11, however, early AM 6/11 she developed respiratory distress requiring accessory muscle use. RN was suctioning thick secretions out of airway, which patient was unable to clear. If was felt that this was move of an obstructed airway issue than respiratory distress secondary to further neurological injury by neurology assessment. Based on this development the decision was made to intubate the patient and she was transferred to ICU.   PAST MEDICAL HISTORY :  She  has a past medical history of Abdominal pain (10/04/2013); Adenomatous colon polyp; Allergy; Altered sensation, foot (04/26/2012); Alzheimer disease; Anemia; Bipolar affective ([place]); Blood transfusion without reported diagnosis; BPPV (benign paroxysmal positional vertigo) (05/18/2011); Cataract; Chest pain (06/18/2016); De Quervain's syndrome (tenosynovitis)  (02/23/2011); Diabetes mellitus; Dysphagia (08/28/2013); Dysuria (03/21/2014); Flank pain (08/28/2013); Foot pain, right (11/11/2010); GERD (gastroesophageal reflux disease); Hyperlipidemia; Hypertension; Hypokalemia (03/18/2011); Hypothyroidism; Immune deficiency disorder ([place]); Internal hemorrhoids; Leg swelling (02/14/2013); Mass of oropharynx (05/15/2014); Musculoskeletal pain (08/16/2012); Scalp pain (01/16/2015); Sciatica (10/08/2014); Seizures ([place]); and Stiffness of neck (05/16/2014).  PAST SURGICAL HISTORY: She  has a past surgical history that includes Abdominal hysterectomy and Tonsilectomy, adenoidectomy, bilateral myringotomy and tubes.  Allergies  Allergen Reactions  . Lactose Intolerance (Gi) Other (See Comments)    constipation  . Vicodin [Hydrocodone-Acetaminophen] Itching and Other (See Comments)    Dizzy and confused  . Tramadol Other (See Comments)    Distorted and confused  . Neurontin [Gabapentin] Other (See Comments)    Drowsy. Makes her feel "funny"    No current facility-administered medications on file prior to encounter.    Current Outpatient Prescriptions on File Prior to Encounter  Medication Sig  . atorvastatin (LIPITOR) 40 MG tablet Take 1 tablet (40 mg total) by mouth daily.  [name] ELIQUIS 5 MG TABS tablet TAKE 1 TABLET (5 MG TOTAL) BY MOUTH 2 TIMES DAILY.  [name] guaiFENesin-dextromethorphan (ROBITUSSIN DM) 100-10 MG/5ML syrup Take 5 mLs by mouth every 4 (four) hours as needed for cough.  . levothyroxine (SYNTHROID, LEVOTHROID) 50 MCG tablet TAKE 1 TABLET (50 MCG TOTAL) BY MOUTH DAILY.  . magnesium oxide (MAG-OX) 400 (241.3 Mg) MG tablet Take 1 tablet (400 mg total) by mouth 2 (two) times daily. (Patient taking differently: Take 400 mg by mouth daily. )  . Melatonin 10 MG TABS Take 10 mg by mouth at bedtime.  . metoprolol (TOPROL-XL) 200 MG 24 hr tablet Take 1 tablet (  200 mg total) by mouth daily.  . QUEtiapine (SEROQUEL) 25 MG tablet Take 1 tablet (25 mg total) by mouth at  bedtime.  . traZODone (DESYREL) 50 MG tablet Take 0.5-1 tablets (25-50 mg total) by mouth at bedtime as needed for sleep. (Patient taking differently: Take 50 mg by mouth at bedtime. )  . valsartan (DIOVAN) 320 MG tablet TAKE 1 TABLET (320 MG TOTAL) BY MOUTH DAILY.  . metoprolol (TOPROL-XL) 200 MG 24 hr tablet TAKE 1 TABLET (200 MG TOTAL) BY MOUTH DAILY. (Patient not taking: Reported on 03/22/2017)  . oxyCODONE (OXY IR/ROXICODONE) 5 MG immediate release tablet Take 0.5-1 tablets (2.5-5 mg total) by mouth every 6 (six) hours as needed for severe pain. (Patient not taking: Reported on 03/05/2017)  . polyethylene glycol powder (GLYCOLAX/MIRALAX) powder Take 17 g by mouth daily. (Patient taking differently: Take 17 g by mouth daily as needed for mild constipation. )  . senna (SENOKOT) 8.6 MG tablet Take 2 tablets (17.2 mg total) by mouth 2 (two) times daily. Once achieve daily BM decrease to one tablet daily (Patient taking differently: Take 2 tablets by mouth 2 (two) times daily as needed for constipation. Once achieve daily BM decrease to one tablet daily)    FAMILY HISTORY:  Her @FAMSTP (<SUBSCRIPT> error)@  SOCIAL HISTORY: She  reports that she quit smoking about 5 years ago. She has never used smokeless tobacco. She reports that she does not drink alcohol or use drugs.  REVIEW OF SYSTEMS:   Unable as patient is encephalopathic and intubated  SUBJECTIVE:  Unable  VITAL SIGNS: BP (!) 164/89 (BP Location: Right Arm)   Pulse 78   Temp 98.3 F (36.8 C) (Axillary)   Resp 16   Ht 5\' 3"  (1.6 m)   Wt 81.6 kg (179 lb 12.8 oz)   SpO2 100%   BMI 31.85 kg/m   HEMODYNAMICS:    VENTILATOR SETTINGS:    INTAKE / OUTPUT: I/O last 3 completed shifts: In: 1408.7 [I.V.:1408.7] Out: 450 [Urine:450]  PHYSICAL EXAMINATION: General:  Frail elderly female in NAD on vent Neuro:  Sedated HEENT:  [place]/ AT, PERRL, no JVD Cardiovascular:  RRR, no MRG Lungs:  Somewhat coarse in the R Base Abdomen:  Soft,  non-distended Musculoskeletal: No acute deformity Skin:  Grossly intact  LABS:  BMET  Recent Labs Lab 03/05/17 0703 03/06/17 0552 03/07/17 0331  NA 139 138 136  K 3.6 3.2* 3.3*  CL 105 102 100*  CO2 25 26 28   BUN 12 12 14   CREATININE 0.72 0.79 0.77  GLUCOSE 171* 183* 179*    Electrolytes  Recent Labs Lab 03/03/17 1833 03/04/17 0452 03/04/17 1637 03/05/17 0703 03/06/17 0552 03/07/17 0331  CALCIUM  --  8.6*  --  8.7* 8.8* 8.8*  MG 2.0 2.1 2.1  --   --   --   PHOS 2.7 3.4 2.9  --   --   --     CBC  Recent Labs Lab 03/05/17 0703 03/06/17 0552 03/07/17 0331  WBC 8.8 10.8* 9.9  HGB 10.7* 11.4* 11.5*  HCT 32.0* 35.1* 34.3*  PLT 161 177 175    Coag's  Recent Labs Lab 03/03/2017 0954  APTT 31  INR 1.12    Sepsis Markers No results for input(s): LATICACIDVEN, PROCALCITON, O2SATVEN in the last 168 hours.  ABG  Recent Labs Lab 03/03/17 0056  PHART 7.331*  PCO2ART 50.9*  PO2ART 109.0*    Liver Enzymes  Recent Labs Lab 02/26/2017 0954 03/02/17 0215  AST 33  31  ALT 21 20  ALKPHOS 93 88  BILITOT 0.5 0.9  ALBUMIN 3.6 3.7    Cardiac Enzymes No results for input(s): TROPONINI, PROBNP in the last 168 hours.  Glucose  Recent Labs Lab 03/06/17 2354 03/07/17 0815 03/07/17 1617 03/07/17 2018 03/08/17 0002 03/08/17 0355  GLUCAP 182* 210* 172* 265* 200* 148*    Imaging Dg Hand Complete Left  Result Date: 03/07/2017 CLINICAL DATA:  Left hand pain.  No known injury. EXAM: LEFT HAND - COMPLETE 3+ VIEW COMPARISON:  None. FINDINGS: No acute bony or joint abnormality is identified. Scattered degenerative disease is identified about the DIP and PIP joints. There is also first [place] osteoarthritis. Soft tissues are unremarkable. IMPRESSION: No acute abnormality. Scattered osteoarthritis most notable the first [facility] joint. Electronically Signed   By: Inge Rise M.D.   On: 03/07/2017 13:05     STUDIES:  6/5 CT head > Overall relatively similar  exam to yesterday. Left thalamic 2.2 x 2 x 1.6 cm hematoma with surrounding vasogenic edema minimally changed from the prior examination when this measured 2.1 x 2 x 1.5 cm. Mass-effect upon the left lateral ventricle and third ventricle with 3 mm midline shift to the right. No development of hydrocephalus. 8 mm rounded hyperdensity right occipital lobe (series 5, image 54) unchanged to from 02/08/2017 and possibly related to remote hemorrhage. Chronic microvascular changes. Global atrophy.  6/4 MRI brain > 1. 2.0 x 1.7 x 1.9 cm acute left thalamic hemorrhage. Associated vasogenic edema with localized 3 mm of left-to-right midline shift. No hydrocephalus or evidence for ventricular trapping. No intraventricular extension. Hypertensive etiology is suspected. 2. Innumerable scattered foci of chronic hemorrhages involving the posterior bilateral cerebral hemispheres, deep gray nuclei, brainstem, and cerebellum. Distribution favors chronic underlying hypertension. 3. Small remote right thalamic lacunar infarcts with additional small remote left cerebellar infarct. 4. Generalized age-related cerebral atrophy with moderate chronic small vessel ischemic disease.  6/4 Ct Head > 2 cm left thalamic hematoma (~3 cc). Associated edema and local mild mass-effect.  TTE - Normal LV size with mild LV hypertrophy. EF 60-65%. Moderate diastolic dysfunction. Normal RV size and systolic function. Nosignificant valvular abnormalities. Left atrial enlargement. 6/7 CT head >. Stable size of left thalamic hemorrhage, measuring 2.2 x 2.0 x 1.7 cm. Associated vasogenic edema is relatively similar with similar mass effect on the adjacent left lateral and third ventricles and stable 3 mm of left-to-right shift. No hydrocephalus or ventricular trapping. 2. No other new acute intracranial process.  6/08 CT head> Continued stable hemorrhage centered at the left ventral thalamus with estimated blood volume of 5 mL. Surrounding edema and mild  regional mass effect are stable. No new acute intracranial abnormality.  CULTURES: Tracheal asp 6/11 >  ANTIBIOTICS:   SIGNIFICANT EVENTS:   LINES/TUBES: ETT 6/11 >  DISCUSSION: 76 year old female admitted 6/4 for ICH who was progressing slowly. 6/11 Developed respiratory distress requiring intubation. Likely due to aspiration or mucous plug. Moved to ICU 6/11 on vent. DNR status decided.   ASSESSMENT / PLAN:  NEUROLOGIC A:   Left thalamic ICH with midline shift Expressive aphasia R weakness  P:   RASS goal: 0 to -1 Stroke service primary CT head pending. Propofol infusion Fentanyl infusion  PULMONARY A: Acute hypoxemic respiratory failure secondary to suspected aspiration  P:   Full vent support CXR stat ABG 1 hour VAP bundle  CARDIOVASCULAR A:  HTN Atrial fibrillation  P:  Telemetry monitoring Nicardipine infusion to keep SBP <  140mmHg Holding eliquis and all anticoagulation Continuing home metoprolol, irbesartan  RENAL A:   Hypokalemia  P:   Scheduled K Follow BMP  GASTROINTESTINAL A:   Dysphagia  P:   Protonix TF Dysphagia one diet prior to intubation Continue tube feeding  HEMATOLOGIC A:   Anemia  P:  Follow CBC SQ heparin SCDs  INFECTIOUS A:   Possible aspiration  P:   Monitor off ABX Sputum culture  ENDOCRINE A:   DM    P:   CBG monitoring and SSI   FAMILY  - Updates: family updated at length. Have decided for DNR in the case of cardiac arrest.  - Inter-disciplinary family meet or Palliative Care meeting due by:  6/18  Georgann Housekeeper, AGACNP-BC [place] Pulmonology/Critical Care Pager [phone] or [phone]  Attending Note:  76 year old female with history of dementia and bipolar disorder who was completely dependent for ADL who presents with ICH.  Patient was assigned a dysphagia I diet on top of TF but family was bringing her outside food.  Patient developed respiratory distress on 6/11 AM and  anesthesia was called to intubate.  Patient was transferred to 74M and PCCM was consulted for vent and critical care management.  During intubation, evidently solid food was suctioned from airway.  ABG ordered and pending.  CXR with ETT in good position and no infiltrate noted that I reviewed myself.  On exam, lungs are clear. Will pan culture, hold off abx for now, CXR and ABG in AM.  Adjust vent for ABG.  Propofol and fentanyl drips for sedation.  Hold in ICU.  May need consider palliative care involvement but will need to speak with family first.  PCCM will follow.  Spoke with family, now DNR.  The patient is critically ill with multiple organ systems failure and requires high complexity decision making for assessment and support, frequent evaluation and titration of therapies, application of advanced monitoring technologies and extensive interpretation of multiple databases.   Critical Care Time devoted to patient care services described in this note is  45  Minutes. This time reflects time of care of this signee Dr Jennet Maduro. This critical care time does not reflect procedure time, or teaching time or supervisory time of PA/NP/Med student/Med Resident etc but could involve care discussion time.  Rush Farmer, M.D. [facility] Pulmonary/Critical Care Medicine. Pager: [phone]. After hours pager: [phone].  03/08/2017 8:07 AM

## 2017-03-08 NOTE — Significant Event (Signed)
Rapid Response Event Note  Overview: Time Called: 0630 Arrival Time: 0635 Event Type: Respiratory  Initial Focused Assessment: Called by bedside RN for respiratory distress, concern for acute aspiration.  RN states RT has suctioned out white/tan matter from oropharynyx.  Respiratory therapy at bedside with primary RN on my arrival. Pt with audible stridor, minimal air movement upon auscultation, accessory muscle use and obvious distress. O2 saturation maintained on 5L [place] at 96%, HR and BP WNL throughout encounter. Pt was minimally responsive and nonverbal. Dr. Elsworth Soho called at [facility], order for ICU bed placed.  CCM MD intubating another pt and unable to respond.  CRNA called for impending intubation and arrived promptly.  Initially, CRNA placed NPA with some improvement in respiratory status.  However, decision was made by Dr. Leonel Ramsay to intubate due to increased work of breathing and return of stridor.   Following intubation,portable CXR conducted and pt was transferred to 2M16.   Pt's family informed of transfer and updated on plan of care.    Event Summary: Name of Physician Notified: Dr. Leonel Ramsay at [facility]  Name of Consulting Physician Notified: Dr. Elsworth Soho at [phone]  Outcome: Transferred (Comment)  Event End Time: [phone]  Pam Drown

## 2017-03-08 NOTE — Progress Notes (Signed)
PT Cancellation Note  Patient Details Name: Kristine Boyer MRN: [id number] DOB: January 24, 1941   Cancelled Treatment:    Reason Eval/Treat Not Completed: Medical issues which prohibited therapy. Chart review indicates pt is now intubated with transfer to ICU. Will continue to follow and continue with PT POC when medically appropriate.    Thelma Comp 03/08/2017, 7:35 AM   Rolinda Roan, PT, DPT Acute Rehabilitation Services Pager: [phone]

## 2017-03-08 NOTE — Anesthesia Procedure Notes (Signed)
Procedure Name: Intubation Date/Time: 03/08/2017 7:19 AM Performed by: Merrilyn Puma B Pre-anesthesia Checklist: Patient identified, Emergency Drugs available, Suction available, Patient being monitored and Timeout performed Patient Re-evaluated:Patient Re-evaluated prior to inductionOxygen Delivery Method: Circle system utilized Preoxygenation: Pre-oxygenation with 100% oxygen Intubation Type: IV induction, Cricoid Pressure applied and Rapid sequence Grade View: Grade I Tube type: Subglottic suction tube Tube size: 7.5 mm Number of attempts: 1 Airway Equipment and Method: Stylet and Video-laryngoscopy Placement Confirmation: ETT inserted through vocal cords under direct vision,  positive ETCO2,  CO2 detector and breath sounds checked- equal and bilateral Secured at: 21 cm Tube secured with: Tape Dental Injury: Teeth and Oropharynx as per pre-operative assessment

## 2017-03-08 NOTE — Progress Notes (Signed)
Noted change in medical status. I will follow. [phone]

## 2017-03-08 NOTE — Progress Notes (Signed)
OT Cancellation Note  Patient Details Name: Kristine Boyer MRN: [id number] DOB: May 17, 1941   Cancelled Treatment:    Reason Eval/Treat Not Completed: Medical issues which prohibited therapy. Pt transferred to ICU due to respiratory distress and intubated.   [place], OT/L  [phone] 03/08/2017 03/08/2017, 7:34 AM

## 2017-03-08 NOTE — Progress Notes (Signed)
Results for PHYLIS, JAVED (MRN [id number]) as of 03/08/2017 09:33  Ref. Range 03/07/2017 16:17 03/07/2017 20:18 03/08/2017 00:02 03/08/2017 03:55 03/08/2017 08:30  Glucose-Capillary Latest Ref Range: 65 - 99 mg/dL 172 (H) 265 (H) 200 (H) 148 (H) 244 (H)  Noted that blood sugars continue to be greater than 180 mg/dl.  Recommend ICU hyperglycemia protocol while in ICU, on ventilator. Will continue to monitor while in the hospital.   Harvel Ricks RN BSN CDE Diabetes Coordinator Pager: [phone]  8am-5pm

## 2017-03-08 NOTE — Progress Notes (Signed)
Patient with worsening respiratory distress ths am. On arrival, patient with labored breathing, using accessory muscles but awake, alert fixating and tracking. Very little air movement.   Due to distress, decision to intubate was made. CCM was intubating a different patient and therefore anesthesia was called. She will be transferred to ICU.   Roland Rack, MD [facility] [phone]  If 7pm- 7am, please page neurology on call as listed in [place].

## 2017-03-09 ENCOUNTER — Inpatient Hospital Stay (HOSPITAL_COMMUNITY): Payer: Medicare HMO

## 2017-03-09 DIAGNOSIS — F0280 Dementia in other diseases classified elsewhere without behavioral disturbance: Secondary | ICD-10-CM

## 2017-03-09 LAB — GLUCOSE, CAPILLARY
GLUCOSE-CAPILLARY: 141 mg/dL — AB (ref 65–99)
GLUCOSE-CAPILLARY: 166 mg/dL — AB (ref 65–99)
Glucose-Capillary: 119 mg/dL — ABNORMAL HIGH (ref 65–99)
Glucose-Capillary: 136 mg/dL — ABNORMAL HIGH (ref 65–99)
Glucose-Capillary: 228 mg/dL — ABNORMAL HIGH (ref 65–99)
Glucose-Capillary: 235 mg/dL — ABNORMAL HIGH (ref 65–99)

## 2017-03-09 LAB — BASIC METABOLIC PANEL
Anion gap: 8 (ref 5–15)
BUN: 30 mg/dL — AB (ref 6–20)
CHLORIDE: 106 mmol/L (ref 101–111)
CO2: 27 mmol/L (ref 22–32)
CREATININE: 1.08 mg/dL — AB (ref 0.44–1.00)
Calcium: 9.1 mg/dL (ref 8.9–10.3)
GFR calc Af Amer: 57 mL/min — ABNORMAL LOW (ref >60)
GFR calc non Af Amer: 49 mL/min — ABNORMAL LOW (ref >60)
GLUCOSE: 128 mg/dL — AB (ref 65–99)
Potassium: 4.7 mmol/L (ref 3.5–5.1)
SODIUM: 141 mmol/L (ref 135–145)

## 2017-03-09 LAB — BLOOD GAS, ARTERIAL
Acid-Base Excess: 2.2 mmol/L — ABNORMAL HIGH (ref 0.0–2.0)
Bicarbonate: 26.6 mmol/L (ref 20.0–28.0)
DRAWN BY: 419771
FIO2: 40
O2 SAT: 98.8 %
PATIENT TEMPERATURE: 98.6
PCO2 ART: 44.7 mmHg (ref 32.0–48.0)
PEEP: 5 cmH2O
PO2 ART: 149 mmHg — AB (ref 83.0–108.0)
RATE: 18 resp/min
VT: 420 mL
pH, Arterial: 7.393 (ref 7.350–7.450)

## 2017-03-09 LAB — CBC
HCT: 33.1 % — ABNORMAL LOW (ref 36.0–46.0)
Hemoglobin: 10.7 g/dL — ABNORMAL LOW (ref 12.0–15.0)
MCH: 26.4 pg (ref 26.0–34.0)
MCHC: 32.3 g/dL (ref 30.0–36.0)
MCV: 81.5 fL (ref 78.0–100.0)
Platelets: 172 10*3/uL (ref 150–400)
RBC: 4.06 MIL/uL (ref 3.87–5.11)
RDW: 13.7 % (ref 11.5–15.5)
WBC: 13.3 10*3/uL — AB (ref 4.0–10.5)

## 2017-03-09 LAB — PHOSPHORUS: PHOSPHORUS: 4.2 mg/dL (ref 2.5–4.6)

## 2017-03-09 LAB — MAGNESIUM: MAGNESIUM: 2.2 mg/dL (ref 1.7–2.4)

## 2017-03-09 MED ORDER — VITAL HIGH PROTEIN PO LIQD
1000.0000 mL | ORAL | Status: DC
  Administered 2017-03-09 – 2017-03-14 (×3): 1000 mL
  Filled 2017-03-09 (×2): qty 1000

## 2017-03-09 MED ORDER — PRO-STAT SUGAR FREE PO LIQD
30.0000 mL | Freq: Two times a day (BID) | ORAL | Status: DC
  Administered 2017-03-09 – 2017-03-14 (×11): 30 mL
  Filled 2017-03-09 (×11): qty 30

## 2017-03-09 MED ORDER — SODIUM CHLORIDE 0.9 % IV SOLN
3.0000 g | Freq: Three times a day (TID) | INTRAVENOUS | Status: DC
  Administered 2017-03-09 – 2017-03-13 (×12): 3 g via INTRAVENOUS
  Filled 2017-03-09 (×13): qty 3

## 2017-03-09 MED ORDER — SENNOSIDES 8.8 MG/5ML PO SYRP
5.0000 mL | ORAL_SOLUTION | Freq: Two times a day (BID) | ORAL | Status: DC
  Administered 2017-03-09 – 2017-03-14 (×7): 5 mL
  Filled 2017-03-09 (×14): qty 5

## 2017-03-09 MED ORDER — VANCOMYCIN HCL IN DEXTROSE 750-5 MG/150ML-% IV SOLN
750.0000 mg | Freq: Two times a day (BID) | INTRAVENOUS | Status: DC
  Administered 2017-03-10: 750 mg via INTRAVENOUS
  Filled 2017-03-09 (×2): qty 150

## 2017-03-09 MED ORDER — VANCOMYCIN HCL 10 G IV SOLR
1500.0000 mg | Freq: Once | INTRAVENOUS | Status: AC
  Administered 2017-03-09: 1500 mg via INTRAVENOUS
  Filled 2017-03-09: qty 1500

## 2017-03-09 MED ORDER — DOCUSATE SODIUM 50 MG/5ML PO LIQD
100.0000 mg | Freq: Two times a day (BID) | ORAL | Status: DC
  Administered 2017-03-09 – 2017-03-14 (×7): 100 mg via ORAL
  Filled 2017-03-09 (×12): qty 10

## 2017-03-09 NOTE — Progress Notes (Signed)
Pharmacy Antibiotic Note  Kristine Boyer is a 76 y.o. female admitted on 03/24/2017 with pneumonia.  Pharmacy has been consulted for vancomycin and unasyn dosing.  Plan: Vancomycin 750 IV every 12 hours.  Goal trough 15-20 mcg/mL. Unasyn 2g every 8 hours  Follow renal function, LOT plans, and culture results Narrow when appropriate Vanc trough at steady state  Height: 5\' 3"  (160 cm) Weight: 177 lb 4 oz (80.4 kg) IBW/kg (Calculated) : 52.4  Temp (24hrs), Avg:98.8 F (37.1 C), Min:98 F (36.7 C), Max:100.1 F (37.8 C)   Recent Labs Lab 03/04/17 0452 03/05/17 0703 03/06/17 0552 03/07/17 0331 03/09/17 0529  WBC 8.2 8.8 10.8* 9.9 13.3*  CREATININE 0.87 0.72 0.79 0.77 1.08*    Estimated Creatinine Clearance: 45.2 mL/min (A) (by C-G formula based on SCr of 1.08 mg/dL (H)).    Allergies  Allergen Reactions  . Lactose Intolerance (Gi) Other (See Comments)    constipation  . Vicodin [Hydrocodone-Acetaminophen] Itching and Other (See Comments)    Dizzy and confused  . Tramadol Other (See Comments)    Distorted and confused  . Neurontin [Gabapentin] Other (See Comments)    Drowsy. Makes her feel "funny"    Antimicrobials this admission: 6/12 vanc >>  6/12 unasyn >>   Dose adjustments this admission:   Microbiology results: 6/11 BCx: ngtd 6/11 Sputum: gram + cocci  6/11 MRSA PCR: positive  Thank you for allowing pharmacy to be a part of this patient's care.  MORNA FLUD 03/09/2017 12:51 PM

## 2017-03-09 NOTE — Progress Notes (Addendum)
Physical Therapy Discharge Patient Details Name: Kristine Boyer MRN: [id number] DOB: [birth year] Today's Date: 03/09/2017 Time:  -     Patient discharged from PT services secondary to medical decline - will need to re-order PT to resume therapy services    Progress and discharge plan discussed with patient and/or caregiver: Patient unable to participate in discharge planning and no caregivers available  GP     Shary Decamp [facility] 03/09/2017, 2:22 PM  [employer] PT [phone]

## 2017-03-09 NOTE — Progress Notes (Signed)
PULMONARY / CRITICAL CARE MEDICINE   Name: Kristine Boyer MRN: [id number] DOB: 01-14-1941    ADMISSION DATE:  03/05/2017 CONSULTATION DATE:  03/08/2017  REFERRING MD:  Dr. Leonie Man  CHIEF COMPLAINT:  Respiratory distress  HISTORY OF PRESENT ILLNESS:   76 year old female with PMH as below, which is significant for dementia, bipolar disorder, DM, HTN, and hypothyroid. At baseline she requires assisstance with multiple ADLs and is able to feed/dress herself. She cannot prepare the food or pick out her clothes.  She was admitted 6/4 to [facility] with [place] after being found aphasic with R weakness upon awakening. She is on Eliquis for history of Afib and CT demonstrated left thalamic hemorrhage. She was admitted to the stroke service. Bleed was thought to be hypertensive in nature.   Since admission she had been improving. Resultant expressive aphasia and R hemiparesis persisted, but she was able to be started back on her home mediactions and was able to take PO with a restricted diet. Course not complicated by AF or uncontrolled DM. She was preparing for discharge to SNF early in the week of 6/11, however, early AM 6/11 she developed respiratory distress requiring accessory muscle use. RN was suctioning thick secretions out of airway, which patient was unable to clear. If was felt that this was move of an obstructed airway issue than respiratory distress secondary to further neurological injury by neurology assessment. Based on this development the decision was made to intubate the patient and she was transferred to ICU.    SUBJECTIVE:  No acute events overnight. Sedation decreased; however, mental status remains unchanged and unable to follow any commands.   Sputum culture with some GPC's.  VITAL SIGNS: BP 116/66   Pulse 68   Temp 98.4 F (36.9 C) (Oral)   Resp 18   Ht 5\' 3"  (1.6 m)   Wt 80.4 kg (177 lb 4 oz)   SpO2 100%   BMI 31.40 kg/m   HEMODYNAMICS:    VENTILATOR SETTINGS: Vent Mode:  PRVC FiO2 (%):  [40 %-100 %] 40 % Set Rate:  [18 bmp] 18 bmp Vt Set:  [420 mL] 420 mL PEEP:  [5 cmH20] 5 cmH20 Plateau Pressure:  [16 cmH20-19 cmH20] 19 cmH20  INTAKE / OUTPUT: I/O last 3 completed shifts: In: 2257 [I.V.:2257] Out: 440 [Urine:410; Emesis/NG output:30]  PHYSICAL EXAMINATION: General: Chronically ill appearing, in NAD. Neuro: Sedated, minimally responsive. HEENT: [place] / AT. PERRL. Cardiovascular: RRR, no M/R/G. Lungs: Clear bilaterally. Abdomen: BS x 4, S/NT/ND. Musculoskeletal: No deformities, no edema. Skin: Warm, dry.  LABS:  BMET  Recent Labs Lab 03/06/17 0552 03/07/17 0331 03/09/17 0529  NA 138 136 141  K 3.2* 3.3* 4.7  CL 102 100* 106  CO2 26 28 27   BUN 12 14 30*  CREATININE 0.79 0.77 1.08*  GLUCOSE 183* 179* 128*    Electrolytes  Recent Labs Lab 03/04/17 0452 03/04/17 1637  03/06/17 0552 03/07/17 0331 03/09/17 0529  CALCIUM 8.6*  --   < > 8.8* 8.8* 9.1  MG 2.1 2.1  --   --   --  2.2  PHOS 3.4 2.9  --   --   --  4.2  < > = values in this interval not displayed.  CBC  Recent Labs Lab 03/06/17 0552 03/07/17 0331 03/09/17 0529  WBC 10.8* 9.9 13.3*  HGB 11.4* 11.5* 10.7*  HCT 35.1* 34.3* 33.1*  PLT 177 175 172    Coag's No results for input(s): APTT, INR in  the last 168 hours.  Sepsis Markers No results for input(s): LATICACIDVEN, PROCALCITON, O2SATVEN in the last 168 hours.  ABG  Recent Labs Lab 03/03/17 0056 03/08/17 1010 03/09/17 0409  PHART 7.331* 7.360 7.393  PCO2ART 50.9* 49.5* 44.7  PO2ART 109.0* 440* 149*    Liver Enzymes No results for input(s): AST, ALT, ALKPHOS, BILITOT, ALBUMIN in the last 168 hours.  Cardiac Enzymes No results for input(s): TROPONINI, PROBNP in the last 168 hours.  Glucose  Recent Labs Lab 03/08/17 1150 03/08/17 1551 03/08/17 2031 03/09/17 0014 03/09/17 0357 03/09/17 0717  GLUCAP 253* 158* 101* 235* 136* 119*    Imaging Ct Head Wo Contrast  Result Date:  03/08/2017 CLINICAL DATA:  Followup intracranial hemorrhage. Respiratory arrest. EXAM: CT HEAD WITHOUT CONTRAST TECHNIQUE: Contiguous axial images were obtained from the base of the skull through the vertex without intravenous contrast. COMPARISON:  03/05/2017 FINDINGS: Brain: Left thalamic intraparenchymal hematoma is no larger, measuring 23 x 17 x 21 mm. Density is diminishing. Mild surrounding edema. No new hemorrhage. Chronic small-vessel ischemic changes remain evident throughout the deep brain bilaterally. No large vessel territory infarction. No mass lesion, hydrocephalus or extra-axial collection. Vascular: There is atherosclerotic calcification of the major vessels at the base of the brain. Skull: Negative Sinuses/Orbits: Clear/normal Other: None significant IMPRESSION: No unexpected finding. No enlargement of the left thalamic intraparenchymal hematoma, which is becoming less dense in normal evolutionary fashion. Electronically Signed   By: Nelson Chimes M.D.   On: 03/08/2017 09:50     STUDIES:  6/5 CT head > Overall relatively similar exam to yesterday. Left thalamic 2.2 x 2 x 1.6 cm hematoma with surrounding vasogenic edema minimally changed from the prior examination when this measured 2.1 x 2 x 1.5 cm. Mass-effect upon the left lateral ventricle and third ventricle with 3 mm midline shift to the right. No development of hydrocephalus. 8 mm rounded hyperdensity right occipital lobe (series 5, image 54) unchanged to from 02/08/2017 and possibly related to remote hemorrhage. Chronic microvascular changes. Global atrophy.  6/4 MRI brain > 1. 2.0 x 1.7 x 1.9 cm acute left thalamic hemorrhage. Associated vasogenic edema with localized 3 mm of left-to-right midline shift. No hydrocephalus or evidence for ventricular trapping. No intraventricular extension. Hypertensive etiology is suspected. 2. Innumerable scattered foci of chronic hemorrhages involving the posterior bilateral cerebral hemispheres, deep  gray nuclei, brainstem, and cerebellum. Distribution favors chronic underlying hypertension. 3. Small remote right thalamic lacunar infarcts with additional small remote left cerebellar infarct. 4. Generalized age-related cerebral atrophy with moderate chronic small vessel ischemic disease.  6/4 Ct Head > 2 cm left thalamic hematoma (~3 cc). Associated edema and local mild mass-effect.  TTE - Normal LV size with mild LV hypertrophy. EF 60-65%. Moderate diastolic dysfunction. Normal RV size and systolic function. Nosignificant valvular abnormalities. Left atrial enlargement. 6/7 CT head >. Stable size of left thalamic hemorrhage, measuring 2.2 x 2.0 x 1.7 cm. Associated vasogenic edema is relatively similar with similar mass effect on the adjacent left lateral and third ventricles and stable 3 mm of left-to-right shift. No hydrocephalus or ventricular trapping. 2. No other new acute intracranial process.  6/08 CT head> Continued stable hemorrhage centered at the left ventral thalamus with estimated blood volume of 5 mL. Surrounding edema and mild regional mass effect are stable. No new acute intracranial abnormality. CT head 6/12 > No enlargement of left thalamic IPH.  CULTURES: Tracheal asp 6/11 > GPC's >   ANTIBIOTICS: None  SIGNIFICANT EVENTS: 6/11 >  aspiration, transferred to ICU.  LINES/TUBES: ETT 6/11 >  DISCUSSION: 76 year old female admitted 6/4 for ICH who was progressing slowly. 6/11 Developed respiratory distress requiring intubation. Likely due to aspiration or mucous plug. Moved to ICU 6/11 on vent. DNR status decided.   ASSESSMENT / PLAN:  NEUROLOGIC A:   Left thalamic ICH with midline shift Expressive aphasia R weakness P:   Sedation: Propofol gtt / Fentanyl gtt. RASS goal: 0 to -1 Rest per stroke team PT / OT once extubated  PULMONARY A: Acute hypoxemic respiratory failure secondary to suspected aspiration - s/p intubation 6/11 P:   Full vent support Mental  status may be barrier to weaning / extubation Continue VAP bundle Start vanc / unasyn for now CXR in AM  CARDIOVASCULAR A:  HTN Atrial fibrillation P:  Nicardipine infusion to keep SBP < 150mmHg Labetalol PRN for above goals Holding eliquis and all anticoagulation Continuing home metoprolol, irbesartan  RENAL A:   Hypokalemia - resolved 6/12 AKI P:   Correct electrolytes as indicated BMP in AM  GASTROINTESTINAL A:   Dysphagia - had been on dysphagia 1 diet prior to intubation P:   Protonix Continue tube feeding Would probably repeat swallow eval once extubated  HEMATOLOGIC A:   Anemia VTE prophylaxis P:  Transfuse for Hgb < 7. SCD's / SQ heparin CBC in AM  INFECTIOUS A:   Probable aspiration - sputum prelim with GPC's P:   Start vanc / unasyn for now Follow sputum culture  ENDOCRINE A:   DM Hypothyroidism P:   CBG monitoring and SSI Continue synthroid   FAMILY  - Updates: family updated at length on transfer to ICU 6/11. Have decided for DNR in the case of cardiac arrest.  - Inter-disciplinary family meet or Palliative Care meeting due by:  6/18  CC time: 30 min.   Montey Hora, [facility] Pager: [phone]  or [phone] 03/09/2017, 12:24 PM   Attending Note:  I have examined patient, reviewed labs, studies and notes. I have discussed the case with [place], and I agree with the data and plans as amended above. 76 year old woman with history of dementia, bipolar, diabetes, hypertension who was admitted with a thalamic stroke and associated hemorrhage. She was recovering with some evidence for dysphagia and dysarthria, right-sided weakness. On 6/11 she developed breast for distress felt to be related to possible aspiration injury. She was urgently intubated and moved to the ICU. On my evaluation today her sedation is off and she is poorly responsive. She has some upper extremity tremor. Lungs are  clear, decreased at the bases heart is regular without a murmur. Abdomen benign. Her sputum/resp culture has shown gram-positive cocci, no speciation yet. She has an evolving leukocytosis. Serum creatinine is slightly increased compared with 6/11. I believe she needs to be covered with empiric antibiotics at least until her sputum culture is finalized. We will try to minimize her sedation as we treat with antibiotics to assess for neurological stability. Hopefully we will be pushed or spontaneous breathing over the next 48- 72 hours. Independent critical care time is 35 minutes.   Baltazar Apo, MD, PhD 03/09/2017, 1:49 PM [facility] [phone] or if no answer [phone]

## 2017-03-09 NOTE — Progress Notes (Signed)
Nutrition Follow-up  DOCUMENTATION CODES:   Obesity unspecified  INTERVENTION:   Resume TF via Cortrak tube:   Vital High Protein at 35 ml/h with Pro-stat 30 ml BID to provide 1040 kcal, 104 gm protein, 702 ml free water daily.  NUTRITION DIAGNOSIS:   Inadequate oral intake related to inability to eat as evidenced by NPO status.  Ongoing  GOAL:   Patient will meet greater than or equal to 90% of their needs  Unmet  MONITOR:   Diet advancement, TF tolerance, Labs  ASSESSMENT:   Pt with PMH of demntia, sz, mass of oropharynx (2015), GERD, dysphagia, DM, bipolar affective admitted from home where her son and daughter rotate staying with her with ICH with mass effect likely due to uncontrolled HTN.   Discussed patient in ICU rounds and with RN. Patient developed respiratory distress due to obstructed airway, was intubated, and moved to the ICU on 6/11. Patient is currently intubated on ventilator support MV: 8.4 L/min Temp (24hrs), Avg:98.8 F (37.1 C), Min:98 F (36.7 C), Max:100.1 F (37.8 C)  Propofol: off  Prior to intubation, diet had been advanced to dysphagia 1 with thin liquids. SLP was following. She was also receiving TF via Cortrak tube prior to intubation, but it has been on hold since intubation due to possibility of aspiration during her respiratory event on 6/11. TF being resumed today. Labs reviewed. CBG's: 119-228 Medications reviewed and include KCl & Propofol.  Diet Order:   NPO  Skin:   (R thigh open blister)  Last BM:  6/11  Height:   Ht Readings from Last 1 Encounters:  03/08/17 5\' 3"  (1.6 m)    Weight:   Wt Readings from Last 1 Encounters:  03/09/17 177 lb 4 oz (80.4 kg)    Ideal Body Weight:  52.2 kg  BMI:  Body mass index is 31.4 kg/m.  Estimated Nutritional Needs:   Kcal:  1000-1125  Protein:  104 gm  Fluid:  > 1.5 L/day  EDUCATION NEEDS:   No education needs identified at this time  Molli Barrows, [place], [place],  [place] Pager [phone] After Hours Pager [phone]

## 2017-03-09 NOTE — Progress Notes (Signed)
STROKE TEAM PROGRESS NOTE   SUBJECTIVE (INTERVAL HISTORY) Patient remains intubated and now has elevated WBC suggestive of possible aspiration and is being started on antibiotics. I met with patient's daughter and grand son and answered questions OBJECTIVE Temp:  [98 F (36.7 C)-100.1 F (37.8 C)] 98.5 F (36.9 C) (06/12 1158) Pulse Rate:  [65-88] 65 (06/12 1200) Cardiac Rhythm: Heart block (06/12 0800) Resp:  [12-21] 18 (06/12 1200) BP: (91-190)/(53-133) 145/133 (06/12 1200) SpO2:  [100 %] 100 % (06/12 1200) FiO2 (%):  [40 %-50 %] 40 % (06/12 1200) Weight:  [177 lb 4 oz (80.4 kg)] 177 lb 4 oz (80.4 kg) (06/12 0420)  CBC:   Recent Labs Lab 03/07/17 0331 03/09/17 0529  WBC 9.9 13.3*  HGB 11.5* 10.7*  HCT 34.3* 33.1*  MCV 79.6 81.5  PLT 175 109    Basic Metabolic Panel:   Recent Labs Lab 03/04/17 1637  03/07/17 0331 03/09/17 0529  NA  --   < > 136 141  K  --   < > 3.3* 4.7  CL  --   < > 100* 106  CO2  --   < > 28 27  GLUCOSE  --   < > 179* 128*  BUN  --   < > 14 30*  CREATININE  --   < > 0.77 1.08*  CALCIUM  --   < > 8.8* 9.1  MG 2.1  --   --  2.2  PHOS 2.9  --   --  4.2  < > = values in this interval not displayed.  Lipid Panel:     Component Value Date/Time   CHOL 140 12/03/2016 0409   TRIG 46 03/02/2017 1010   HDL 51 12/03/2016 0409   CHOLHDL 2.7 12/03/2016 0409   VLDL 18 12/03/2016 0409   LDLCALC 71 12/03/2016 0409   HgbA1c:  Lab Results  Component Value Date   HGBA1C 11.9 (H) 02/08/2017   Urine Drug Screen:     Component Value Date/Time   LABOPIA NONE DETECTED 03/11/2017 0940   COCAINSCRNUR NONE DETECTED 03/13/2017 0940   LABBENZ NONE DETECTED 03/08/2017 0940   AMPHETMU NONE DETECTED 03/14/2017 0940   THCU NONE DETECTED 03/22/2017 0940   LABBARB NONE DETECTED 03/10/2017 0940    Alcohol Level     Component Value Date/Time   ETH <5 03/05/2017 0954    IMAGING  I have personally reviewed the radiological images below and agree with the  radiology interpretations.  Ct Head Wo Contrast 03/02/2017 0706 Overall relatively similar exam to yesterday. Left thalamic 2.2 x 2 x 1.6 cm hematoma with surrounding vasogenic edema minimally changed from the prior examination when this measured 2.1 x 2 x 1.5 cm. Mass-effect upon the left lateral ventricle and third ventricle with 3 mm midline shift to the right. No development of hydrocephalus. 8 mm rounded hyperdensity right occipital lobe (series 5, image 54) unchanged to from 02/08/2017 and possibly related to remote hemorrhage. Chronic microvascular changes. Global atrophy.   Mr Brain Wo Contrast 03/06/2017 2221 1. 2.0 x 1.7 x 1.9 cm acute left thalamic hemorrhage. Associated vasogenic edema with localized 3 mm of left-to-right midline shift. No hydrocephalus or evidence for ventricular trapping. No intraventricular extension. Hypertensive etiology is suspected. 2. Innumerable scattered foci of chronic hemorrhages involving the posterior bilateral cerebral hemispheres, deep gray nuclei, brainstem, and cerebellum. Distribution favors chronic underlying hypertension. 3. Small remote right thalamic lacunar infarcts with additional small remote left cerebellar infarct. 4. Generalized age-related cerebral atrophy  with moderate chronic small vessel ischemic disease.   Ct Head Wo Contrast 03/10/2017 1049 2 cm left thalamic hematoma (~3 cc). Associated edema and local mild mass-effect.   CUS - 02/09/17 - Bilateral: Negative bilateral carotid duplex ultrasound.[name]  TTE - Normal LV size with mild LV hypertrophy. EF 60-65%. Moderate   diastolic dysfunction. Normal RV size and systolic function. No   significant valvular abnormalities. Left atrial enlargement.  Ct Head Wo Contrast 03/04/2017 IMPRESSION: 1. Stable size of left thalamic hemorrhage, measuring 2.2 x 2.0 x 1.7 cm. Associated vasogenic edema is relatively similar with similar mass effect on the adjacent left lateral and third ventricles and stable 3  mm of left-to-right shift. No hydrocephalus or ventricular trapping. 2. No other new acute intracranial process.   Ct Head Wo Contrast 03/05/2017 1. Continued stable hemorrhage centered at the left ventral thalamus with estimated blood volume of 5 mL. Surrounding edema and mild regional mass effect are stable. 2. No new acute intracranial abnormality.   DG Left Hand Complete 03/07/2017 No acute abnormality.   PHYSICAL EXAM  Temp:  [98 F (36.7 C)-100.1 F (37.8 C)] 98.5 F (36.9 C) (06/12 1158) Pulse Rate:  [65-88] 65 (06/12 1200) Resp:  [12-21] 18 (06/12 1200) BP: (91-190)/(53-133) 145/133 (06/12 1200) SpO2:  [100 %] 100 % (06/12 1200) FiO2 (%):  [40 %-50 %] 40 % (06/12 1200) Weight:  [177 lb 4 oz (80.4 kg)] 177 lb 4 oz (80.4 kg) (06/12 0420)  General - frail elderly African-American lady, who is now intubated  Ophthalmologic - Fundi not visualized due to noncooperation.  Cardiovascular - Regular rate and rhythm, not in afib.  Neuro -intubated drowsy barely opens eyes, expressive aphasia > receptive aphasia, not following any commands,   No gaze preference, attending to both sides. Blinking to visual threat bilaterally. PERRL. Right nasolabial fold flattening. Tongue midline. LUE and LLE 5/5, RUE drift and 3+/5 proximal and 4/5 distal. RLE 3/5 proximal and 4/5 distal. DTR 1+ and no babinski. Sensation, coordination not cooperative and gait not tested.  ASSESSMENT/PLAN [name] is a 76 y.o. female with history of AF on eliquis, baseline dementia, HTN, HLD, DM, BPPV, bipolar presenting with slurred speech and R hemiparesis. CT showed a L thalamic hemorrhage.   Stroke:   L thalamic hemorrhage with mild cerebral edema and midline shift likely due to uncontrolled hypertension  Resultant  expressive aphasia, R hemiparesis  CT head L thalamic hemorrhage with mild edema and mild mass effect  MRI head L thalamic hemorrhage w/ 65m left to right shift. Scattered  microhemorrhages. 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 Small vessel disease. Atrophy.   Repeat CT x 2 no significant change, no increase in hmg or hydrocephalus  Carotid Doppler  01/2017 Unremarkable   2D Echo  EF 60-65%  LDL 71 in March  HgbA1c 11.9 in May  SCDs for VTE prophylaxis    Eliquis (apixaban) daily prior to admission, now on No antithrombotic given hemorrhage.  Ongoing aggressive stroke risk factor management  Therapy recommendations:  CIR   Disposition:  pending   Paroxysmal Atrial Fibrillation  Home anticoagulation:  Eliquis (apixaban) daily - last dose 5 days prior to admission  No an AC candidate now d/t hemorrhage  Home metoprolol 200 q 24h  CHA2DS2-VASc Score = at least 7  Age in Years:  ?79   +2   Sex:  Female   Female   +1    Hypertension History:  yes   +1  Diabetes Mellitus:  yes   +1   Congestive Heart Failure History:  0  Vascular Disease History:  0  Stroke/TIA/Thromboembolism History:  yes   +2  Will consider anticoagulation options once blood absorbed.   Hypertension  Home med: diovan and metoprolol  BP stable   SBP goal < 160  Off Cleviprex  Resumed ibersartan and metoprolol  Add amlodipine for better BP control  Continue to monitor  Hyperlipidemia  Home meds:  lipitor 40  LDL 71 in March, almost at goal < 70  Continue statin at discharge  Diabetes  Home med: glucotrol  HgbA1c 11.9 in May, goal < 7.0  Uncontrolled  SSI  Dysphagia   On dys 1 diet with thin liquid  Baseline dysphagia due to unclear etiology  On TF. Once able to eat more, may consider d/c TF  Speech to follow  Other Stroke Risk Factors  Advanced age  UDS / ETOH level negative   Obesity, Body mass index is 31.4 kg/m., recommend weight loss, diet and exercise as appropriate   Other Active Problems  Baseline dementia - seroquel, desyrel  Bipolar  Hypothyroid on synthroid  Constipation on miralax, senna   Left hand pain  and swelling -> no abnormality on x-ray. Likely osteoarthritis  Hypokalemia - 3.3 - supplement - recheck Tuesday  Mild anemia - stable   PLAN Continue ventilatory support for the next few days and treat for presumed aspiration pneumonia.d/w Dr Lamonte Sakai who agrees with plan. D/w daughter  Hospital day # 8   Long d/w grandson and daughter  about her ICH, AFIB ,stroke risk and new resp distress requiring intubation. She is realistic and does not want prolonged ventilatory support, tracheostomy. We will continue supportive care for the next few days and see how she responds. Discuss with critical care Md Dr Lamonte Sakai. This patient is critically ill and at significant risk of neurological worsening, death and care requires constant monitoring of vital signs, hemodynamics,respiratory and cardiac monitoring, extensive review of multiple databases, frequent neurological assessment, discussion with family, other specialists and medical decision making of high complexity.I have made any additions or clarifications directly to the above note.This critical care time does not reflect procedure time, or teaching time or supervisory time of PA/NP/Med Resident etc but could involve care discussion time.  I spent 32 minutes of neurocritical care time  in the care of  this patient.     Antony Contras, MD Medical Director [facility] Pager: [phone] 03/09/2017 1:39 PM  To contact Stroke Continuity provider, please refer to [email or web address] After hours, contact General Neurology

## 2017-03-10 ENCOUNTER — Other Ambulatory Visit: Payer: Self-pay | Admitting: Family Medicine

## 2017-03-10 ENCOUNTER — Inpatient Hospital Stay (HOSPITAL_COMMUNITY): Payer: Medicare HMO

## 2017-03-10 DIAGNOSIS — J9600 Acute respiratory failure, unspecified whether with hypoxia or hypercapnia: Secondary | ICD-10-CM

## 2017-03-10 DIAGNOSIS — T17908A Unspecified foreign body in respiratory tract, part unspecified causing other injury, initial encounter: Secondary | ICD-10-CM

## 2017-03-10 DIAGNOSIS — J9690 Respiratory failure, unspecified, unspecified whether with hypoxia or hypercapnia: Secondary | ICD-10-CM

## 2017-03-10 DIAGNOSIS — T17908D Unspecified foreign body in respiratory tract, part unspecified causing other injury, subsequent encounter: Secondary | ICD-10-CM

## 2017-03-10 LAB — CBC
HEMATOCRIT: 30.1 % — AB (ref 36.0–46.0)
Hemoglobin: 9.8 g/dL — ABNORMAL LOW (ref 12.0–15.0)
MCH: 26.6 pg (ref 26.0–34.0)
MCHC: 32.6 g/dL (ref 30.0–36.0)
MCV: 81.6 fL (ref 78.0–100.0)
Platelets: 166 10*3/uL (ref 150–400)
RBC: 3.69 MIL/uL — ABNORMAL LOW (ref 3.87–5.11)
RDW: 13.7 % (ref 11.5–15.5)
WBC: 12.5 10*3/uL — ABNORMAL HIGH (ref 4.0–10.5)

## 2017-03-10 LAB — BLOOD GAS, ARTERIAL
Acid-Base Excess: 2.4 mmol/L — ABNORMAL HIGH (ref 0.0–2.0)
Bicarbonate: 27.7 mmol/L (ref 20.0–28.0)
DRAWN BY: 41977
FIO2: 40
MECHVT: 420 mL
O2 SAT: 98.8 %
PEEP/CPAP: 5 cmH2O
PH ART: 7.338 — AB (ref 7.350–7.450)
PO2 ART: 148 mmHg — AB (ref 83.0–108.0)
Patient temperature: 98.6
RATE: 18 resp/min
pCO2 arterial: 53 mmHg — ABNORMAL HIGH (ref 32.0–48.0)

## 2017-03-10 LAB — GLUCOSE, CAPILLARY
GLUCOSE-CAPILLARY: 203 mg/dL — AB (ref 65–99)
Glucose-Capillary: 155 mg/dL — ABNORMAL HIGH (ref 65–99)
Glucose-Capillary: 173 mg/dL — ABNORMAL HIGH (ref 65–99)
Glucose-Capillary: 176 mg/dL — ABNORMAL HIGH (ref 65–99)
Glucose-Capillary: 194 mg/dL — ABNORMAL HIGH (ref 65–99)
Glucose-Capillary: 221 mg/dL — ABNORMAL HIGH (ref 65–99)

## 2017-03-10 LAB — BASIC METABOLIC PANEL
ANION GAP: 8 (ref 5–15)
BUN: 32 mg/dL — ABNORMAL HIGH (ref 6–20)
CO2: 26 mmol/L (ref 22–32)
Calcium: 8.5 mg/dL — ABNORMAL LOW (ref 8.9–10.3)
Chloride: 109 mmol/L (ref 101–111)
Creatinine, Ser: 0.91 mg/dL (ref 0.44–1.00)
GFR calc Af Amer: >60 mL/min (ref >60)
GFR calc non Af Amer: >60 mL/min (ref >60)
GLUCOSE: 193 mg/dL — AB (ref 65–99)
POTASSIUM: 4.1 mmol/L (ref 3.5–5.1)
Sodium: 143 mmol/L (ref 135–145)

## 2017-03-10 LAB — CULTURE, RESPIRATORY: CULTURE: NORMAL

## 2017-03-10 LAB — MAGNESIUM: Magnesium: 2.4 mg/dL (ref 1.7–2.4)

## 2017-03-10 LAB — PHOSPHORUS: Phosphorus: 4.3 mg/dL (ref 2.5–4.6)

## 2017-03-10 MED ORDER — GUAIFENESIN 100 MG/5ML PO SOLN
10.0000 mL | Freq: Four times a day (QID) | ORAL | Status: DC
  Administered 2017-03-10 – 2017-03-15 (×20): 200 mg
  Filled 2017-03-10 (×2): qty 10
  Filled 2017-03-10: qty 20
  Filled 2017-03-10 (×14): qty 10
  Filled 2017-03-10: qty 100
  Filled 2017-03-10 (×2): qty 10

## 2017-03-10 MED ORDER — FENTANYL CITRATE (PF) 100 MCG/2ML IJ SOLN
12.5000 ug | INTRAMUSCULAR | Status: DC | PRN
  Administered 2017-03-10 – 2017-03-12 (×3): 25 ug via INTRAVENOUS
  Filled 2017-03-10 (×5): qty 2

## 2017-03-10 NOTE — Progress Notes (Signed)
OT Discharge Note  Patient Details Name: Kristine Boyer MRN: [id number] DOB: 02-06-1941   Cancelled Treatment:    Reason Eval/Treat Not Completed: Medical issues which prohibited therapy. Medical decline and will sign off at this time.    [name], [name]  [phone] 03/10/2017, 7:13 AM

## 2017-03-10 NOTE — Progress Notes (Addendum)
STROKE TEAM PROGRESS NOTE   SUBJECTIVE (INTERVAL HISTORY) Patient is alone, no family at bedside. Developed respiratory distress and intubated moved to ICU. Palliative care possible.  OBJECTIVE Temp:  [97.8 F (36.6 C)-98.7 F (37.1 C)] 98.7 F (37.1 C) (06/13 1126) Pulse Rate:  [60-88] 66 (06/13 1200) Cardiac Rhythm: Normal sinus rhythm (06/13 1200) Resp:  [17-24] 18 (06/13 1200) BP: (88-165)/(55-115) 153/72 (06/13 1200) SpO2:  [93 %-100 %] 93 % (06/13 1200) FiO2 (%):  [40 %] 40 % (06/13 1200) Weight:  [176 lb 5.9 oz (80 kg)] 176 lb 5.9 oz (80 kg) (06/13 0500)  CBC:   Recent Labs Lab 03/09/17 0529 03/10/17 0335  WBC 13.3* 12.5*  HGB 10.7* 9.8*  HCT 33.1* 30.1*  MCV 81.5 81.6  PLT 172 782    Basic Metabolic Panel:   Recent Labs Lab 03/09/17 0529 03/10/17 0335  NA 141 143  K 4.7 4.1  CL 106 109  CO2 27 26  GLUCOSE 128* 193*  BUN 30* 32*  CREATININE 1.08* 0.91  CALCIUM 9.1 8.5*  MG 2.2 2.4  PHOS 4.2 4.3    Lipid Panel:     Component Value Date/Time   CHOL 140 12/03/2016 0409   TRIG 46 03/02/2017 1010   HDL 51 12/03/2016 0409   CHOLHDL 2.7 12/03/2016 0409   VLDL 18 12/03/2016 0409   LDLCALC 71 12/03/2016 0409   HgbA1c:  Lab Results  Component Value Date   HGBA1C 11.9 (H) 02/08/2017   Urine Drug Screen:     Component Value Date/Time   LABOPIA NONE DETECTED 03/15/2017 0940   COCAINSCRNUR NONE DETECTED 03/15/2017 0940   LABBENZ NONE DETECTED 02/28/2017 0940   AMPHETMU NONE DETECTED 03/22/2017 0940   THCU NONE DETECTED 03/26/2017 0940   LABBARB NONE DETECTED 03/20/2017 0940    Alcohol Level     Component Value Date/Time   ETH <5 03/10/2017 0954    IMAGING  I have personally reviewed the radiological images below and agree with the radiology interpretations.  Ct Head Wo Contrast 03/02/2017 0706 Overall relatively similar exam to yesterday. Left thalamic 2.2 x 2 x 1.6 cm hematoma with surrounding vasogenic edema minimally changed from the  prior examination when this measured 2.1 x 2 x 1.5 cm. Mass-effect upon the left lateral ventricle and third ventricle with 3 mm midline shift to the right. No development of hydrocephalus. 8 mm rounded hyperdensity right occipital lobe (series 5, image 54) unchanged to from 02/08/2017 and possibly related to remote hemorrhage. Chronic microvascular changes. Global atrophy.   Mr Brain Wo Contrast 02/26/2017 2221 1. 2.0 x 1.7 x 1.9 cm acute left thalamic hemorrhage. Associated vasogenic edema with localized 3 mm of left-to-right midline shift. No hydrocephalus or evidence for ventricular trapping. No intraventricular extension. Hypertensive etiology is suspected. 2. Innumerable scattered foci of chronic hemorrhages involving the posterior bilateral cerebral hemispheres, deep gray nuclei, brainstem, and cerebellum. Distribution favors chronic underlying hypertension. 3. Small remote right thalamic lacunar infarcts with additional small remote left cerebellar infarct. 4. Generalized age-related cerebral atrophy with moderate chronic small vessel ischemic disease.   Ct Head Wo Contrast 03/08/2017 1049 2 cm left thalamic hematoma (~3 cc). Associated edema and local mild mass-effect.   CUS - 02/09/17 - Bilateral: Negative bilateral carotid duplex ultrasound.[name]  TTE - Normal LV size with mild LV hypertrophy. EF 60-65%. Moderate   diastolic dysfunction. Normal RV size and systolic function. No   significant valvular abnormalities. Left atrial enlargement.  Ct Head Wo Contrast 03/04/2017 IMPRESSION: 1.  Stable size of left thalamic hemorrhage, measuring 2.2 x 2.0 x 1.7 cm. Associated vasogenic edema is relatively similar with similar mass effect on the adjacent left lateral and third ventricles and stable 3 mm of left-to-right shift. No hydrocephalus or ventricular trapping. 2. No other new acute intracranial process.   Ct Head Wo Contrast 03/05/2017 1. Continued stable hemorrhage centered at the left ventral  thalamus with estimated blood volume of 5 mL. Surrounding edema and mild regional mass effect are stable. 2. No new acute intracranial abnormality.   DG Left Hand Complete 03/07/2017 No acute abnormality.   PHYSICAL EXAM  Temp:  [97.8 F (36.6 C)-98.7 F (37.1 C)] 98.7 F (37.1 C) (06/13 1126) Pulse Rate:  [60-88] 66 (06/13 1200) Resp:  [17-24] 18 (06/13 1200) BP: (88-165)/(55-115) 153/72 (06/13 1200) SpO2:  [93 %-100 %] 93 % (06/13 1200) FiO2 (%):  [40 %] 40 % (06/13 1200) Weight:  [176 lb 5.9 oz (80 kg)] 176 lb 5.9 oz (80 kg) (06/13 0500)  General - frail elderly African-American lady, who is now intubated  Ophthalmologic - Fundi not visualized due to noncooperation.  Cardiovascular - Regular rate and rhythm, not in afib.  Neuro -  Intubated and sedated. Not following commands. NAD. PERRL. RRR, no M/R/G. Opens eyes to stim. Globally aphasic. Does not blink to threat. Tongue midline. Cough and gag weak. Motor system exam reveals right NL fold flattening. Right-sided weakness.    ASSESSMENT/PLAN [name] is a 76 y.o. female with history of AF on eliquis, baseline dementia, HTN, HLD, DM, BPPV, bipolar presenting with slurred speech and R hemiparesis. CT showed a L thalamic hemorrhage.   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 Scattered microhemorrhages. 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 Small vessel disease. Atrophy.   Repeat CT x 2 no significant change, no increase in hmg or hydrocephalus  Carotid Doppler  01/2017 Unremarkable   2D Echo  EF 60-65%  LDL 71 in March  HgbA1c 11.9 in May  SCDs for VTE prophylaxis    Eliquis (apixaban) daily prior to admission, now on No antithrombotic  given hemorrhage. May resume Eliquis when blood has resolved.  Ongoing aggressive stroke risk factor management  Therapy recommendations:  pending  Disposition:  pending   Paroxysmal Atrial Fibrillation  Home anticoagulation:  Eliquis (apixaban) daily - last dose 5 days prior to admission  No an AC candidate now d/t hemorrhage  Home metoprolol 200 q 24h  CHA2DS2-VASc Score = at least 7  Age in Years:  ?63   +2    Sex:  Female   Female   +1    Hypertension History:  yes   +1     Diabetes Mellitus:  yes   +1   Congestive Heart Failure History:  0  Vascular Disease History:  0  Stroke/TIA/Thromboembolism History:  yes   +2  Will consider anticoagulation options once blood absorbed.   Hypertension  Home med: diovan and metoprolol  BP stable   SBP goal < 160  Off Cleviprex  Resumed ibersartan and metoprolol  Add amlodipine for better BP control  Continue to monitor  Hyperlipidemia  Home meds:  lipitor 40  LDL 71 in March, almost at goal <  59  Continue statin at discharge  Diabetes  Home med: glucotrol  HgbA1c 11.9 in May, goal < 7.0  Uncontrolled  SSI  Dysphagia   On dys 1 diet with thin liquid but now intubated  Baseline dysphagia due to unclear etiology  Speech to follow  Other Stroke Risk Factors  Advanced age  UDS / ETOH level negative   Obesity, Body mass index is 31.24 kg/m., recommend weight loss, diet and exercise as appropriate   Other Active Problems  Baseline dementia - seroquel, desyrel  Bipolar  Hypothyroid on synthroid  Constipation on miralax, senna   Left hand pain and swelling -> no abnormality on x-ray. Likely osteoarthritis  Hypokalemia - 3.3 - supplement - recheck Tuesday  Mild anemia - stable   Hospital day # 9   Personally examined patient and images, and have participated in and made any corrections needed to history, physical, neuro exam,assessment and plan as stated above.  I have personally obtained the  history, evaluated lab date, reviewed imaging studies and agree with radiology interpretations.   Sarina Ill, MD Stroke Neurology    To contact Stroke Continuity provider, please refer to [email or web address] After hours, contact General Neurology

## 2017-03-10 NOTE — Clinical Social Work Note (Signed)
Clinical Social Worker continuing to follow patient and family for support and discharge planning needs.  Patient is now intubated with possible plans for trach placement following patient family wishes.  CSW remains available for support and to assist with discharge planning needs once medically appropriate.  Barbette Or, [place]

## 2017-03-10 NOTE — Progress Notes (Signed)
Speech Language Pathology Discharge Patient Details Name: Kristine Boyer MRN: [id number] DOB: [birth year] Today's Date: 03/10/2017 Time:  -     Patient discharged from SLP services secondary to intubation. Please re-eval when/if pt ready  Please see latest therapy progress note for current level of functioning and progress toward goals.    Progress and discharge plan discussed with patient and/or caregiver: Patient unable to participate in discharge planning and no caregivers available  GO     Houston Siren 03/10/2017, 7:35 AM    Orbie Pyo Colvin Caroli.Ed [employer] [phone]

## 2017-03-10 NOTE — Progress Notes (Signed)
PULMONARY / CRITICAL CARE MEDICINE   Name: Kristine Boyer MRN: [id number] DOB: [birth year]    ADMISSION DATE:  03/25/2017 CONSULTATION DATE:  03/08/2017  REFERRING MD:  Dr. Leonie Man  CHIEF COMPLAINT:  Respiratory distress  HISTORY OF PRESENT ILLNESS:   76 year old female with PMH as below, which is significant for dementia, bipolar disorder, DM, HTN, and hypothyroid. At baseline she requires assisstance with multiple ADLs and is able to feed/dress herself. She cannot prepare the food or pick out her clothes.  She was admitted 6/4 to [facility] with [place] after being found aphasic with R weakness upon awakening. She is on Eliquis for history of Afib and CT demonstrated left thalamic hemorrhage. She was admitted to the stroke service. Bleed was thought to be hypertensive in nature.   Since admission she had been improving. Resultant expressive aphasia and R hemiparesis persisted, but she was able to be started back on her home mediactions and was able to take PO with a restricted diet. Course not complicated by AF or uncontrolled DM. She was preparing for discharge to SNF early in the week of 6/11, however, early AM 6/11 she developed respiratory distress requiring accessory muscle use. RN was suctioning thick secretions out of airway, which patient was unable to clear. If was felt that this was move of an obstructed airway issue than respiratory distress secondary to further neurological injury by neurology assessment. Based on this development the decision was made to intubate the patient and she was transferred to ICU.    SUBJECTIVE:   Sedated on fent  VITAL SIGNS: BP 125/76   Pulse 69   Temp 97.8 F (36.6 C) (Oral)   Resp 18   Ht 5\' 3"  (1.6 m)   Wt 176 lb 5.9 oz (80 kg)   SpO2 100%   BMI 31.24 kg/m   HEMODYNAMICS:    VENTILATOR SETTINGS: Vent Mode: PRVC FiO2 (%):  [40 %] 40 % Set Rate:  [18 bmp] 18 bmp Vt Set:  [420 mL] 420 mL PEEP:  [5 cmH20] 5 cmH20 Plateau Pressure:  [16 cmH20-18  cmH20] 18 cmH20  INTAKE / OUTPUT: I/O last 3 completed shifts: In: 2358.2 [I.V.:780.5; NG/GT:727.7; IV Piggyback:850] Out: 1940 [WFUXN:2355; Emesis/NG output:30]  General appearance: 76 Year old female, well nourished sedated on vent  Eyes: anicteric sclerae, moist conjunctivae; PERRL, EOMI bilaterally. Mouth:  membranes and no mucosal ulcerations; orally intubated Neck: Trachea midline; neck supple, no JVD Lungs/chest: scattered rhonchi, with normal respiratory effort and no intercostal retractions CV: RRR, no MRGs  Abdomen: Soft, non-tender; no masses or HSM Extremities: No peripheral edema or extremity lymphadenopathy Skin: Normal temperature, turgor and texture; no rash, ulcers or subcutaneous nodules Neuro/ Psych: awakens w/ stimulation. Right side weak. Does not f/c   LABS:  BMET  Recent Labs Lab 03/07/17 0331 03/09/17 0529 03/10/17 0335  NA 136 141 143  K 3.3* 4.7 4.1  CL 100* 106 109  CO2 28 27 26   BUN 14 30* 32*  CREATININE 0.77 1.08* 0.91  GLUCOSE 179* 128* 193*    Electrolytes  Recent Labs Lab 03/04/17 1637  03/07/17 0331 03/09/17 0529 03/10/17 0335  CALCIUM  --   < > 8.8* 9.1 8.5*  MG 2.1  --   --  2.2 2.4  PHOS 2.9  --   --  4.2 4.3  < > = values in this interval not displayed.  CBC  Recent Labs Lab 03/07/17 0331 03/09/17 0529 03/10/17 0335  WBC 9.9 13.3* 12.5*  HGB 11.5* 10.7* 9.8*  HCT 34.3* 33.1* 30.1*  PLT 175 172 166    Coag's No results for input(s): APTT, INR in the last 168 hours.  Sepsis Markers No results for input(s): LATICACIDVEN, PROCALCITON, O2SATVEN in the last 168 hours.  ABG  Recent Labs Lab 03/08/17 1010 03/09/17 0409 03/10/17 0350  PHART 7.360 7.393 7.338*  PCO2ART 49.5* 44.7 53.0*  PO2ART 440* 149* 148*    Liver Enzymes No results for input(s): AST, ALT, ALKPHOS, BILITOT, ALBUMIN in the last 168 hours.  Cardiac Enzymes No results for input(s): TROPONINI, PROBNP in the last 168  hours.  Glucose  Recent Labs Lab 03/09/17 1157 03/09/17 1602 03/09/17 1922 03/10/17 0041 03/10/17 0328 03/10/17 0737  GLUCAP 228* 141* 166* 221* 173* 155*    Imaging Dg Chest Port 1 View  Result Date: 03/10/2017 CLINICAL DATA:  resp failure,  sob EXAM: PORTABLE CHEST 1 VIEW COMPARISON:  Chest x-rays dated 03/09/2017, 03/08/2017 and 02/08/2017. FINDINGS: Endotracheal tube remains well positioned with tip just above the level of the carina. Enteric tube passes below the diaphragm. Heart size is upper normal, stable. Overall cardiomediastinal silhouette is stable in size and configuration. Atherosclerotic changes again noted at the aortic arch. Probable mild atelectasis at the left lung base. There is increased opacity at the right lung apex. No pneumothorax seen. IMPRESSION: 1. Increased opacity at the right lung apex, developing pneumonia not excluded, possibly artifactual related to overlying osseous and extraneous structures. 2. Probable mild atelectasis and/or small effusion at the left lung base. 3. Aortic atherosclerosis. 4. Support apparatus appears stable in position. Electronically Signed   By: Franki Cabot M.D.   On: 03/10/2017 07:16     STUDIES:  6/5 CT head > Overall relatively similar exam to yesterday. Left thalamic 2.2 x 2 x 1.6 cm hematoma with surrounding vasogenic edema minimally changed from the prior examination when this measured 2.1 x 2 x 1.5 cm. Mass-effect upon the left lateral ventricle and third ventricle with 3 mm midline shift to the right. No development of hydrocephalus. 8 mm rounded hyperdensity right occipital lobe (series 5, image 54) unchanged to from 02/08/2017 and possibly related to remote hemorrhage. Chronic microvascular changes. Global atrophy.  6/4 MRI brain > 1. 2.0 x 1.7 x 1.9 cm acute left thalamic hemorrhage. Associated vasogenic edema with localized 3 mm of left-to-right midline shift. No hydrocephalus or evidence for ventricular trapping. No  intraventricular extension. Hypertensive etiology is suspected. 2. Innumerable scattered foci of chronic hemorrhages involving the posterior bilateral cerebral hemispheres, deep gray nuclei, brainstem, and cerebellum. Distribution favors chronic underlying hypertension. 3. Small remote right thalamic lacunar infarcts with additional small remote left cerebellar infarct. 4. Generalized age-related cerebral atrophy with moderate chronic small vessel ischemic disease.  6/4 Ct Head > 2 cm left thalamic hematoma (~3 cc). Associated edema and local mild mass-effect.  TTE - Normal LV size with mild LV hypertrophy. EF 60-65%. Moderate diastolic dysfunction. Normal RV size and systolic function. Nosignificant valvular abnormalities. Left atrial enlargement. 6/7 CT head >. Stable size of left thalamic hemorrhage, measuring 2.2 x 2.0 x 1.7 cm. Associated vasogenic edema is relatively similar with similar mass effect on the adjacent left lateral and third ventricles and stable 3 mm of left-to-right shift. No hydrocephalus or ventricular trapping. 2. No other new acute intracranial process.  6/08 CT head> Continued stable hemorrhage centered at the left ventral thalamus with estimated blood volume of 5 mL. Surrounding edema and mild regional mass effect are stable. No new  acute intracranial abnormality. CT head 6/12 > No enlargement of left thalamic IPH.  CULTURES: Tracheal asp 6/11: NPF  ANTIBIOTICS: vanc 6/12>>>6/12 unasyn 6/12>>>  SIGNIFICANT EVENTS: 6/11 > aspiration, transferred to ICU.  LINES/TUBES: ETT 6/11 >  DISCUSSION: 76 year old female admitted 6/4 for ICH who was progressing slowly. 6/11 Developed respiratory distress requiring intubation. Likely due to aspiration or mucous plug. Moved to ICU 6/11 on vent. DNR status decided.    ASSESSMENT / PLAN:  NEUROLOGIC A:   Left thalamic ICH with midline shift Expressive aphasia R weakness H/o dementia  P:   Dc sedating gtts PAD protocol  goal RAS 0 Will need PT/OT and SLP after extubation   PULMONARY A: Acute hypoxemic respiratory failure secondary to suspected aspiration (has baseline dysphagia d/t bone spurs) - s/p intubation 6/11 pcxr personally reviewed: R>L airspace disease evolving right apex. Can't exclude positional finding.  -apnea during weaning attempt. Think sedation a barrier to progress at this point P:   Full vent support  PAD protocol for RAS 0 to -1 Wean when MS allows   CARDIOVASCULAR A:  HTN Atrial fibrillation HL P:  Holding NOAC d/t hemorrhage Cont lopressor, norvasc and irbesartan  Cont statin   RENAL A:   Hypokalemia - resolved 6/12 AKI P:   Correct electrolytes as indicated BMP in AM  GASTROINTESTINAL A:   Dysphagia - had been on dysphagia 1 diet prior to intubation P:   Protonix Continue tube feeding Would probably repeat swallow eval once extubated  HEMATOLOGIC A:   Anemia VTE prophylaxis P:  Cont [place] heparin Trend cbc Transfuse per protocol  INFECTIOUS A:   Probable aspiration - sputum w/ NPF P:   Cont unasyn day 2, stopping vanc; will complete 7d rx   ENDOCRINE A:   DM Hypothyroidism P:   Cont synthroid Cont ssi   FAMILY  - Updates: family updated at length on transfer to ICU 6/11. Have decided for DNR in the case of cardiac arrest.  - Inter-disciplinary family meet or Palliative Care meeting due by:  6/18    Summary  Intubated for presumed aspiration/mucous plugging event. Now on day 2 abx/ afebrile wbc trending down. Was apneic when weaning trial attempted this am.  For today -dc fent gtt -cont supportive care -narrow to unasyn given NPF in sputum -re-attempt weaning when MS allows  My ccm time 32 minutes  Erick Colace ACNP-BC [place] Pager # [phone] OR # [phone] if no answer   Attending Note:  I have examined patient, reviewed labs, studies and notes. I have discussed the case with Jerrye Bushy, and I agree  with the data and plans as amended above. 76 year old woman with dementia, diabetes, hypertension admitted with a left thalamic hemorrhagic stroke. She was recovering with some residual aphasia, dysphagia and right-sided weakness. Unfortunately she sustained an aspiration event with respiratory decompensation requiring intubation and mechanical ventilation. She is sedated today on evaluation. Lungs are coarse, she was placed on a spontaneous breathing trial and was apneic. We will attempt to decrease her sedation to allow spontaneous breathing. Assess her wakefulness and her airway protection to the best of our ability before planning an extubation for possible success, transition to comfort if she fails. Narrow antibiotics to Unasyn (discontinue vancomycin). DNR status confirmed with family today.  Independent critical care time is 35 minutes.   Baltazar Apo, MD, PhD 03/10/2017, 3:46 PM [facility] [phone] or if no answer [phone]

## 2017-03-11 DIAGNOSIS — T17908A Unspecified foreign body in respiratory tract, part unspecified causing other injury, initial encounter: Secondary | ICD-10-CM

## 2017-03-11 LAB — BASIC METABOLIC PANEL
ANION GAP: 10 (ref 5–15)
BUN: 26 mg/dL — ABNORMAL HIGH (ref 6–20)
CALCIUM: 9.2 mg/dL (ref 8.9–10.3)
CO2: 29 mmol/L (ref 22–32)
Chloride: 104 mmol/L (ref 101–111)
Creatinine, Ser: 0.78 mg/dL (ref 0.44–1.00)
Glucose, Bld: 193 mg/dL — ABNORMAL HIGH (ref 65–99)
Potassium: 3.7 mmol/L (ref 3.5–5.1)
Sodium: 143 mmol/L (ref 135–145)

## 2017-03-11 LAB — CBC
HEMATOCRIT: 32.1 % — AB (ref 36.0–46.0)
Hemoglobin: 10.6 g/dL — ABNORMAL LOW (ref 12.0–15.0)
MCH: 27.2 pg (ref 26.0–34.0)
MCHC: 33 g/dL (ref 30.0–36.0)
MCV: 82.5 fL (ref 78.0–100.0)
PLATELETS: 167 10*3/uL (ref 150–400)
RBC: 3.89 MIL/uL (ref 3.87–5.11)
RDW: 13.8 % (ref 11.5–15.5)
WBC: 12.6 10*3/uL — ABNORMAL HIGH (ref 4.0–10.5)

## 2017-03-11 LAB — GLUCOSE, CAPILLARY
GLUCOSE-CAPILLARY: 178 mg/dL — AB (ref 65–99)
GLUCOSE-CAPILLARY: 201 mg/dL — AB (ref 65–99)
Glucose-Capillary: 181 mg/dL — ABNORMAL HIGH (ref 65–99)
Glucose-Capillary: 161 mg/dL — ABNORMAL HIGH (ref 65–99)
Glucose-Capillary: 202 mg/dL — ABNORMAL HIGH (ref 65–99)
Glucose-Capillary: 191 mg/dL — ABNORMAL HIGH (ref 65–99)

## 2017-03-11 LAB — PROCALCITONIN: Procalcitonin: <0.1 ng/mL

## 2017-03-11 MED ORDER — QUETIAPINE FUMARATE 25 MG PO TABS
25.0000 mg | ORAL_TABLET | Freq: Every day | ORAL | Status: DC
  Administered 2017-03-11: 25 mg via ORAL
  Filled 2017-03-11: qty 1

## 2017-03-11 MED ORDER — AMLODIPINE 1 MG/ML ORAL SUSPENSION
10.0000 mg | Freq: Every day | ORAL | Status: DC
  Administered 2017-03-11: 10 mg
  Filled 2017-03-11 (×3): qty 10

## 2017-03-11 MED ORDER — HYDRALAZINE HCL 20 MG/ML IJ SOLN: 10.0000 mg | INTRAMUSCULAR | Status: DC | PRN

## 2017-03-11 MED ORDER — LORAZEPAM 2 MG/ML IJ SOLN
1.0000 mg | INTRAMUSCULAR | Status: DC | PRN
  Administered 2017-03-11 (×3): 2 mg via INTRAVENOUS
  Filled 2017-03-11 (×3): qty 1

## 2017-03-11 NOTE — Progress Notes (Addendum)
Unable to obtain accurate blood pressure as pt is contracting her arms.  Patient denies pain at present time

## 2017-03-11 NOTE — Progress Notes (Signed)
Pt still sleepy, per MD- hold off on extubation today.

## 2017-03-11 NOTE — Progress Notes (Signed)
STROKE TEAM PROGRESS NOTE   SUBJECTIVE (INTERVAL HISTORY) Patient to be extubated today, discussed with daughter will not re-intubate, patient would not want a trach or peg. Agitated overnight.   OBJECTIVE Temp:  [98.3 F (36.8 C)-100.7 F (38.2 C)] 98.8 F (37.1 C) (06/14 0841) Pulse Rate:  [63-89] 81 (06/14 1126) Cardiac Rhythm: Normal sinus rhythm (06/14 0800) Resp:  [14-19] 14 (06/14 1126) BP: (116-218)/(49-146) 178/96 (06/14 0800) SpO2:  [85 %-100 %] 100 % (06/14 1126) FiO2 (%):  [40 %] 40 % (06/14 1126) Weight:  [176 lb 5.9 oz (80 kg)] 176 lb 5.9 oz (80 kg) (06/14 0500)  CBC:   Recent Labs Lab 03/10/17 0335 03/11/17 0339  WBC 12.5* 12.6*  HGB 9.8* 10.6*  HCT 30.1* 32.1*  MCV 81.6 82.5  PLT 166 277    Basic Metabolic Panel:   Recent Labs Lab 03/09/17 0529 03/10/17 0335 03/11/17 0339  NA 141 143 143  K 4.7 4.1 3.7  CL 106 109 104  CO2 27 26 29   GLUCOSE 128* 193* 193*  BUN 30* 32* 26*  CREATININE 1.08* 0.91 0.78  CALCIUM 9.1 8.5* 9.2  MG 2.2 2.4  --   PHOS 4.2 4.3  --     Lipid Panel:     Component Value Date/Time   CHOL 140 12/03/2016 0409   TRIG 46 03/02/2017 1010   HDL 51 12/03/2016 0409   CHOLHDL 2.7 12/03/2016 0409   VLDL 18 12/03/2016 0409   LDLCALC 71 12/03/2016 0409   HgbA1c:  Lab Results  Component Value Date   HGBA1C 11.9 (H) 02/08/2017   Urine Drug Screen:     Component Value Date/Time   LABOPIA NONE DETECTED 03/25/2017 0940   COCAINSCRNUR NONE DETECTED 03/07/2017 0940   LABBENZ NONE DETECTED 03/04/2017 0940   AMPHETMU NONE DETECTED 03/13/2017 0940   THCU NONE DETECTED 03/25/2017 0940   LABBARB NONE DETECTED 03/02/2017 0940    Alcohol Level     Component Value Date/Time   ETH <5 03/25/2017 0954    IMAGING  I have personally reviewed the radiological images below and agree with the radiology interpretations.  Ct Head Wo Contrast 03/02/2017 0706 Overall relatively similar exam to yesterday. Left thalamic 2.2 x 2 x 1.6  cm hematoma with surrounding vasogenic edema minimally changed from the prior examination when this measured 2.1 x 2 x 1.5 cm. Mass-effect upon the left lateral ventricle and third ventricle with 3 mm midline shift to the right. No development of hydrocephalus. 8 mm rounded hyperdensity right occipital lobe (series 5, image 54) unchanged to from 02/08/2017 and possibly related to remote hemorrhage. Chronic microvascular changes. Global atrophy.   Mr Brain Wo Contrast 03/05/2017 2221 1. 2.0 x 1.7 x 1.9 cm acute left thalamic hemorrhage. Associated vasogenic edema with localized 3 mm of left-to-right midline shift. No hydrocephalus or evidence for ventricular trapping. No intraventricular extension. Hypertensive etiology is suspected. 2. Innumerable scattered foci of chronic hemorrhages involving the posterior bilateral cerebral hemispheres, deep gray nuclei, brainstem, and cerebellum. Distribution favors chronic underlying hypertension. 3. Small remote right thalamic lacunar infarcts with additional small remote left cerebellar infarct. 4. Generalized age-related cerebral atrophy with moderate chronic small vessel ischemic disease.   Ct Head Wo Contrast 02/27/2017 1049 2 cm left thalamic hematoma (~3 cc). Associated edema and local mild mass-effect.   CUS - 02/09/17 - Bilateral: Negative bilateral carotid duplex ultrasound.[name]  TTE - Normal LV size with mild LV hypertrophy. EF 60-65%. Moderate   diastolic dysfunction. Normal RV size  and systolic function. No   significant valvular abnormalities. Left atrial enlargement.  Ct Head Wo Contrast 03/04/2017 IMPRESSION: 1. Stable size of left thalamic hemorrhage, measuring 2.2 x 2.0 x 1.7 cm. Associated vasogenic edema is relatively similar with similar mass effect on the adjacent left lateral and third ventricles and stable 3 mm of left-to-right shift. No hydrocephalus or ventricular trapping. 2. No other new acute intracranial process.   Ct Head Wo  Contrast 03/05/2017 1. Continued stable hemorrhage centered at the left ventral thalamus with estimated blood volume of 5 mL. Surrounding edema and mild regional mass effect are stable. 2. No new acute intracranial abnormality.   DG Left Hand Complete 03/07/2017 No acute abnormality.   PHYSICAL EXAM  Temp:  [98.3 F (36.8 C)-100.7 F (38.2 C)] 98.8 F (37.1 C) (06/14 0841) Pulse Rate:  [63-89] 81 (06/14 1126) Resp:  [14-19] 14 (06/14 1126) BP: (116-218)/(49-146) 178/96 (06/14 0800) SpO2:  [85 %-100 %] 100 % (06/14 1126) FiO2 (%):  [40 %] 40 % (06/14 1126) Weight:  [176 lb 5.9 oz (80 kg)] 176 lb 5.9 oz (80 kg) (06/14 0500)  General - frail elderly African-American lady, who is now intubated  Ophthalmologic - Fundi not visualized due to noncooperation.  Cardiovascular - Regular rate and rhythm, not in afib.  Neuro -  Intubated and sedated. Not following commands. NAD. PERRL but sluggish. Does not opens eyes to stim. Globally aphasic. Does not blink to threat or follow any commands. Tongue midline. Cough and gag weak. Motor system exam reveals right NL fold flattening. Right-sided weakness.  Tripled flex bilateral LE to pain stim.  ASSESSMENT/PLAN [name] is a 76 y.o. female with history of AF on eliquis, baseline dementia, HTN, HLD, DM, BPPV, bipolar presenting with slurred speech and R hemiparesis. CT showed a L thalamic hemorrhage.   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 To be extubated today.  CT head L thalamic hemorrhage with mild edema and mild mass effect  MRI head L thalamic hemorrhage w/ 15mm left to right shift. Scattered microhemorrhages. 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 Small vessel disease. Atrophy.   Repeat CT x 2 no significant change, no increase in hmg or hydrocephalus  Carotid Doppler  01/2017  Unremarkable   2D Echo  EF 60-65%  LDL 71 in March  HgbA1c 11.9 in May  SCDs for VTE prophylaxis    Eliquis (apixaban) daily prior to admission, now on No antithrombotic given hemorrhage. May resume Eliquis when blood has resolved.  Ongoing aggressive stroke risk factor management  Therapy recommendations:  pending  Disposition:  pending   Paroxysmal Atrial Fibrillation  Home anticoagulation:  Eliquis (apixaban) daily - last dose 5 days prior to admission  No an AC candidate now d/t hemorrhage  Home metoprolol 200 q 24h  CHA2DS2-VASc Score = at least 7  Age in Years:  ?28   +2    Sex:  Female   Female   +1    Hypertension History:  yes   +1     Diabetes Mellitus:  yes   +1   Congestive Heart Failure History:  0  Vascular Disease History:  0  Stroke/TIA/Thromboembolism History:  yes   +2  Will consider anticoagulation options once blood absorbed.   Hypertension  Home med: diovan and metoprolol  BP stable   SBP goal < 160  Off Cleviprex  Resumed ibersartan and metoprolol  Add amlodipine for better BP control  Continue to monitor  Hyperlipidemia  Home meds:  lipitor 40  LDL 71 in March, almost at goal < 70  Continue statin at discharge  Diabetes  Home med: glucotrol  HgbA1c 11.9 in May, goal < 7.0  Uncontrolled  SSI  Dysphagia   On dys 1 diet with thin liquid but now intubated  Baseline dysphagia due to unclear etiology  Speech to follow  Other Stroke Risk Factors  Advanced age  UDS / ETOH level negative   Obesity, Body mass index is 31.24 kg/m., recommend weight loss, diet and exercise as appropriate   Other Active Problems  Baseline dementia - seroquel, desyrel  Bipolar  Hypothyroid on synthroid  Constipation on miralax, senna   Left hand pain and swelling -> no abnormality on x-ray. Likely osteoarthritis  Hypokalemia - 3.3 - supplement - recheck Tuesday  Mild anemia - stable   Hospital day # 10   This patient is  critically ill and at significant risk of neurological worsening, death and care requires constant monitoring of vital signs, hemodynamics,respiratory and cardiac monitoring,review of multiple databases, neurological assessment, discussion with family, other specialists and medical decision making of high complexity.  Patient to be extubated today, discussed with daughter will not re-intubate, patient would not want a trach or peg.   I spent 30 minutes of neurocritical care time in the care of this patient.  Sarina Ill, MD [facility]  To contact Stroke Continuity provider, please refer to [email or web address] After hours, contact General Neurology

## 2017-03-11 NOTE — Progress Notes (Signed)
PULMONARY / CRITICAL CARE MEDICINE   Name: Kristine Boyer MRN: [id number] DOB: [birth year]    ADMISSION DATE:  03/09/2017 CONSULTATION DATE:  03/08/2017  REFERRING MD:  Dr. Leonie Man  CHIEF COMPLAINT:  Respiratory distress  HISTORY OF PRESENT ILLNESS:   76 year old female with PMH significant for dementia, bipolar disorder, DM, HTN, and hypothyroid. At baseline she requires assisstance with multiple ADLs and is able to feed/dress herself. She cannot prepare the food or pick out her clothes.  She was admitted 6/4 to [facility] with left thalamic ICH.She is on Eliquis for history of AfibShe was admitted to the stroke service. Bleed was thought to be hypertensive in nature.   Since admission she had been improving. Resultant expressive aphasia and R hemiparesis persisted, but she was able to be started back on her home mediactions and was able to take PO with a restricted diet. On 6/11 she developed respiratory distress & required intubation for presumed aspiration pneumonia   SUBJECTIVE:   Low gr febrile Hypertensive this am Agitated overnight , given ativan @ 6a Daughter at bedside  VITAL SIGNS: BP 126/69   Pulse 81   Temp 99.1 F (37.3 C) (Axillary)   Resp 18   Ht 5\' 3"  (1.6 m)   Wt 176 lb 5.9 oz (80 kg)   SpO2 100%   BMI 31.24 kg/m   HEMODYNAMICS:    VENTILATOR SETTINGS: Vent Mode: CPAP;PSV FiO2 (%):  [40 %] 40 % Set Rate:  [18 bmp] 18 bmp Vt Set:  [420 mL] 420 mL PEEP:  [5 cmH20] 5 cmH20 Pressure Support:  [5 cmH20] 5 cmH20 Plateau Pressure:  [12 cmH20-20 cmH20] 20 cmH20  INTAKE / OUTPUT: I/O last 3 completed shifts: In: 2184.3 [I.V.:576.7; NG/GT:1157.7; IV Piggyback:450] Out: 2150 [Urine:2150]  General appearance: elderly female, well nourished sedated on vent  Eyes: anicteric sclerae, moist conjunctivae; PERRL, EOMI bilaterally. Mouth:  membranes and no mucosal ulcerations; orally intubated Neck: Trachea midline; neck supple, no JVD Lungs/chest: no rhonchi, with  normal respiratory effort and no intercostal retractions CV: RRR, no MRGs  Abdomen: Soft, non-tender; no masses or HSM Extremities: No peripheral edema or extremity lymphadenopathy Skin: Normal temperature, turgor and texture; no rash, ulcers or subcutaneous nodules Neuro/ Psych: awakens w/ stimulation. Right side weak. Does not f/c   LABS:  BMET  Recent Labs Lab 03/09/17 0529 03/10/17 0335 03/11/17 0339  NA 141 143 143  K 4.7 4.1 3.7  CL 106 109 104  CO2 27 26 29   BUN 30* 32* 26*  CREATININE 1.08* 0.91 0.78  GLUCOSE 128* 193* 193*    Electrolytes  Recent Labs Lab 03/04/17 1637  03/09/17 0529 03/10/17 0335 03/11/17 0339  CALCIUM  --   < > 9.1 8.5* 9.2  MG 2.1  --  2.2 2.4  --   PHOS 2.9  --  4.2 4.3  --   < > = values in this interval not displayed.  CBC  Recent Labs Lab 03/09/17 0529 03/10/17 0335 03/11/17 0339  WBC 13.3* 12.5* 12.6*  HGB 10.7* 9.8* 10.6*  HCT 33.1* 30.1* 32.1*  PLT 172 166 167    Coag's No results for input(s): APTT, INR in the last 168 hours.  Sepsis Markers No results for input(s): LATICACIDVEN, PROCALCITON, O2SATVEN in the last 168 hours.  ABG  Recent Labs Lab 03/08/17 1010 03/09/17 0409 03/10/17 0350  PHART 7.360 7.393 7.338*  PCO2ART 49.5* 44.7 53.0*  PO2ART 440* 149* 148*    Liver Enzymes No results  for input(s): AST, ALT, ALKPHOS, BILITOT, ALBUMIN in the last 168 hours.  Cardiac Enzymes No results for input(s): TROPONINI, PROBNP in the last 168 hours.  Glucose  Recent Labs Lab 03/10/17 0737 03/10/17 1121 03/10/17 1504 03/10/17 2051 03/11/17 0000 03/11/17 0353  GLUCAP 155* 203* 194* 176* 201* 181*    Imaging No results found.   STUDIES:  6/5 CT head > Overall relatively similar exam to yesterday. Left thalamic 2.2 x 2 x 1.6 cm hematoma with surrounding vasogenic edema minimally changed from the prior examination when this measured 2.1 x 2 x 1.5 cm. Mass-effect upon the left lateral ventricle and  third ventricle with 3 mm midline shift to the right. No development of hydrocephalus. 8 mm rounded hyperdensity right occipital lobe (series 5, image 54) unchanged to from 02/08/2017 and possibly related to remote hemorrhage. Chronic microvascular changes. Global atrophy.  6/4 MRI brain > 1. 2.0 x 1.7 x 1.9 cm acute left thalamic hemorrhage. Associated vasogenic edema with localized 3 mm of left-to-right midline shift. No hydrocephalus or evidence for ventricular trapping. No intraventricular extension. Hypertensive etiology is suspected. 2. Innumerable scattered foci of chronic hemorrhages involving the posterior bilateral cerebral hemispheres, deep gray nuclei, brainstem, and cerebellum. Distribution favors chronic underlying hypertension. 3. Small remote right thalamic lacunar infarcts with additional small remote left cerebellar infarct. 4. Generalized age-related cerebral atrophy with moderate chronic small vessel ischemic disease.  6/4 Ct Head > 2 cm left thalamic hematoma (~3 cc). Associated edema and local mild mass-effect.  TTE - Normal LV size with mild LV hypertrophy. EF 60-65%. Moderate diastolic dysfunction. Normal RV size and systolic function. Nosignificant valvular abnormalities. Left atrial enlargement. 6/7 CT head >. Stable size of left thalamic hemorrhage, measuring 2.2 x 2.0 x 1.7 cm. Associated vasogenic edema is relatively similar with similar mass effect on the adjacent left lateral and third ventricles and stable 3 mm of left-to-right shift. No hydrocephalus or ventricular trapping. 2. No other new acute intracranial process.  6/08 CT head> Continued stable hemorrhage centered at the left ventral thalamus with estimated blood volume of 5 mL. Surrounding edema and mild regional mass effect are stable. No new acute intracranial abnormality. CT head 6/12 > No enlargement of left thalamic IPH.  CULTURES: Tracheal asp 6/11: NPF  ANTIBIOTICS: vanc 6/12>>>6/12 unasyn  6/12>>>  SIGNIFICANT EVENTS: 6/11 > aspiration, transferred to ICU.  LINES/TUBES: ETT 6/11 >  DISCUSSION: 76 year old female admitted 6/4 for ICH ,6/11 Developed respiratory distress requiring intubation. Likely due to aspiration, DNR status, now weaning well but new fever   ASSESSMENT / PLAN:  NEUROLOGIC A:   Left thalamic ICH with midline shift Expressive aphasia R weakness H/o dementia  P:   Fent prn, use versed if absolutely necessary, dc ativan -more sedated this am PAD protocol goal RASS 0 Will need PT/OT and SLP after extubation  Resume seroquel at some point  PULMONARY A: Acute hypoxemic respiratory failure secondary to suspected aspiration (has baseline dysphagia d/t bone spurs) - s/p intubation 6/11  P:  Wean on PS 5/5 Extubate when more awake    CARDIOVASCULAR A:  HTN Atrial fibrillation HL P:  Holding eliquisd/t hemorrhage Cont lopressor, norvasc and irbesartan  Labetalol/ hydrallazine prn Cont statin   RENAL A:   Hypokalemia - resolved 6/12 AKI P:   Correct electrolytes as indicated BMP in AM  GASTROINTESTINAL A:   Dysphagia - had been on dysphagia 1 diet prior to intubation P:   Protonix Continue tube feeding - repeat swallow  eval once extubated  HEMATOLOGIC A:   Anemia VTE prophylaxis P:  Cont [place] heparin Trend cbc Transfuse per protocol  INFECTIOUS A:   Probable aspiration - sputum w/ NPF P:   Cont unasyn -plan 7d If fevers persist , then broaden  ENDOCRINE A:   DM Hypothyroidism P:   Cont synthroid Cont ssi   FAMILY  - Updates: daughter updated at bedside, DNR noted, will have to clarify reintubation  - Inter-disciplinary family meet or Palliative Care meeting due by:  6/18   The patient is critically ill with multiple organ systems failure and requires high complexity decision making for assessment and support, frequent evaluation and titration of therapies, application of advanced monitoring technologies and  extensive interpretation of multiple databases. Critical Care Time devoted to patient care services described in this note independent of APP time is 33 minutes.   Rigoberto Noel MD

## 2017-03-11 NOTE — Progress Notes (Addendum)
eLink Physician-Brief Progress Note Patient Name: Kristine Boyer DOB: [birth year] MRN: [id number]   Date of Service  03/11/2017  HPI/Events of Note  Kicking, punching, reaching for endotracheal tube despite prn fentanyl Rounding team d/c'd drips on 6/13  eICU Interventions  Add ativan prn titrated to RASS goal 0 restraints     Intervention Category Major Interventions: Delirium, psychosis, severe agitation - evaluation and management  Simonne Maffucci 03/11/2017, 12:02 AM

## 2017-03-11 NOTE — Progress Notes (Signed)
Patient is restless and agitated. Fentanyl 25 mcg administered intravenously.

## 2017-03-11 NOTE — Progress Notes (Signed)
I will sign off at this time and follow up at a distance. [phone]

## 2017-03-12 ENCOUNTER — Inpatient Hospital Stay (HOSPITAL_COMMUNITY): Payer: Medicare HMO

## 2017-03-12 DIAGNOSIS — G9340 Encephalopathy, unspecified: Secondary | ICD-10-CM

## 2017-03-12 LAB — CBC
HEMATOCRIT: 34.5 % — AB (ref 36.0–46.0)
Hemoglobin: 11 g/dL — ABNORMAL LOW (ref 12.0–15.0)
MCH: 26.4 pg (ref 26.0–34.0)
MCHC: 31.9 g/dL (ref 30.0–36.0)
MCV: 82.7 fL (ref 78.0–100.0)
Platelets: 190 10*3/uL (ref 150–400)
RBC: 4.17 MIL/uL (ref 3.87–5.11)
RDW: 13.5 % (ref 11.5–15.5)
WBC: 11.2 10*3/uL — ABNORMAL HIGH (ref 4.0–10.5)

## 2017-03-12 LAB — BASIC METABOLIC PANEL
Anion gap: 10 (ref 5–15)
BUN: 27 mg/dL — AB (ref 6–20)
CALCIUM: 9.3 mg/dL (ref 8.9–10.3)
CO2: 29 mmol/L (ref 22–32)
CREATININE: 0.85 mg/dL (ref 0.44–1.00)
Chloride: 105 mmol/L (ref 101–111)
GFR calc non Af Amer: >60 mL/min (ref >60)
GLUCOSE: 143 mg/dL — AB (ref 65–99)
Potassium: 3.6 mmol/L (ref 3.5–5.1)
Sodium: 144 mmol/L (ref 135–145)

## 2017-03-12 LAB — GLUCOSE, CAPILLARY
GLUCOSE-CAPILLARY: 139 mg/dL — AB (ref 65–99)
GLUCOSE-CAPILLARY: 189 mg/dL — AB (ref 65–99)
GLUCOSE-CAPILLARY: 217 mg/dL — AB (ref 65–99)
Glucose-Capillary: 118 mg/dL — ABNORMAL HIGH (ref 65–99)
Glucose-Capillary: 188 mg/dL — ABNORMAL HIGH (ref 65–99)
Glucose-Capillary: 232 mg/dL — ABNORMAL HIGH (ref 65–99)

## 2017-03-12 LAB — PHOSPHORUS: PHOSPHORUS: 3.9 mg/dL (ref 2.5–4.6)

## 2017-03-12 LAB — MAGNESIUM: Magnesium: 2.1 mg/dL (ref 1.7–2.4)

## 2017-03-12 LAB — PROCALCITONIN: Procalcitonin: <0.1 ng/mL

## 2017-03-12 MED ORDER — CHLORHEXIDINE GLUCONATE 0.12 % MT SOLN
15.0000 mL | Freq: Two times a day (BID) | OROMUCOSAL | Status: DC
  Administered 2017-03-12 – 2017-03-15 (×6): 15 mL via OROMUCOSAL
  Filled 2017-03-12 (×4): qty 15

## 2017-03-12 MED ORDER — AMLODIPINE BESYLATE 10 MG PO TABS
10.0000 mg | ORAL_TABLET | Freq: Every day | ORAL | Status: DC
  Administered 2017-03-12 – 2017-03-14 (×3): 10 mg
  Filled 2017-03-12 (×3): qty 1

## 2017-03-12 MED ORDER — HALOPERIDOL LACTATE 5 MG/ML IJ SOLN
5.0000 mg | Freq: Once | INTRAMUSCULAR | Status: AC
  Administered 2017-03-12: 5 mg via INTRAVENOUS
  Filled 2017-03-12: qty 1

## 2017-03-12 MED ORDER — QUETIAPINE FUMARATE 25 MG PO TABS
25.0000 mg | ORAL_TABLET | Freq: Every day | ORAL | Status: DC
  Administered 2017-03-12 – 2017-03-13 (×2): 25 mg via ORAL
  Filled 2017-03-12 (×2): qty 1

## 2017-03-12 MED ORDER — ORAL CARE MOUTH RINSE: 15.0000 mL | Freq: Two times a day (BID) | OROMUCOSAL | Status: DC

## 2017-03-12 MED ORDER — IPRATROPIUM-ALBUTEROL 0.5-2.5 (3) MG/3ML IN SOLN
3.0000 mL | Freq: Three times a day (TID) | RESPIRATORY_TRACT | Status: DC
  Administered 2017-03-12 – 2017-03-15 (×8): 3 mL via RESPIRATORY_TRACT
  Filled 2017-03-12 (×9): qty 3

## 2017-03-12 MED ORDER — HALOPERIDOL LACTATE 5 MG/ML IJ SOLN: 1.0000 mg | Freq: Once | INTRAMUSCULAR | Status: DC

## 2017-03-12 NOTE — Progress Notes (Signed)
[place] Progress Note Patient Name: Kristine Boyer DOB: [birth year] MRN: [id number]   Date of Service  03/12/2017  HPI/Events of Note  Ongoing agitation despite Seroquel.  QTC less than 500.  Family at bedside.  Ativan dosing in past causing oversedation  eICU Interventions  Plan: Haldol 5 mg IV times one dose Nurse to contact [facility] MD if patient remains agitated.     Intervention Category Major Interventions: Delirium, psychosis, severe agitation - evaluation and management  DETERDING,ELIZABETH 03/12/2017, 9:52 PM

## 2017-03-12 NOTE — Progress Notes (Signed)
RT note Attempted wean, low minute ventilation less than 4l/min, back to full support.

## 2017-03-12 NOTE — Procedures (Signed)
Extubation Procedure Note  Patient Details:   Name: Kristine Boyer DOB: [birth year] MRN: [id number]   Airway Documentation:     Evaluation  O2 sats: stable throughout Complications: No apparent complications Patient did tolerate procedure well. Bilateral Breath Sounds: Clear, Diminished   Yes  4l/min [place]  Revonda Standard 03/12/2017, 12:50 PM

## 2017-03-12 NOTE — Progress Notes (Addendum)
Pharmacy Antibiotic Note  Kristine Boyer is a 76 y.o. female admitted on 02/28/2017 with pneumonia.  Pharmacy has been consulted for unasyn dosing.  Plan: Unasyn 3g every 8 hours  Plan for 7 days total per CCM  Height: 5\' 3"  (160 cm) Weight: 173 lb 15.1 oz (78.9 kg) IBW/kg (Calculated) : 52.4  Temp (24hrs), Avg:99.1 F (37.3 C), Min:97.6 F (36.4 C), Max:100.8 F (38.2 C)   Recent Labs Lab 03/07/17 0331 03/09/17 0529 03/10/17 0335 03/11/17 0339 03/12/17 0409  WBC 9.9 13.3* 12.5* 12.6* 11.2*  CREATININE 0.77 1.08* 0.91 0.78 0.85    Estimated Creatinine Clearance: 56.9 mL/min (by C-G formula based on SCr of 0.85 mg/dL).    Allergies  Allergen Reactions  . Lactose Intolerance (Gi) Other (See Comments)    constipation  . Vicodin [Hydrocodone-Acetaminophen] Itching and Other (See Comments)    Dizzy and confused  . Tramadol Other (See Comments)    Distorted and confused  . Neurontin [Gabapentin] Other (See Comments)    Drowsy. Makes her feel "funny"    Antimicrobials this admission: 6/12 vanc >> 6/13 6/12 unasyn >> 6/18  Dose adjustments this admission:   Microbiology results: 6/11 BCx: ngtd 6/11 Sputum: gram + cocci > normal flora 6/11 MRSA PCR: positive  Thank you for allowing pharmacy to be a part of this patient's care.  Kristine Boyer 03/12/2017 8:11 AM

## 2017-03-12 NOTE — Progress Notes (Signed)
STROKE TEAM PROGRESS NOTE   SUBJECTIVE (INTERVAL HISTORY) No family at bedside this morning. Patient is still intubated. Due to lethargic mental status, extubation held off. Back to full support.   OBJECTIVE Temp:  [97.6 F (36.4 C)-100.8 F (38.2 C)] 98.9 F (37.2 C) (06/15 0825) Pulse Rate:  [68-115] 91 (06/15 0758) Cardiac Rhythm: Normal sinus rhythm (06/15 0400) Resp:  [12-24] 19 (06/15 0758) BP: (104-196)/(61-131) 145/82 (06/15 0700) SpO2:  [97 %-100 %] 100 % (06/15 0758) FiO2 (%):  [40 %] 40 % (06/15 0724) Weight:  [173 lb 15.1 oz (78.9 kg)] 173 lb 15.1 oz (78.9 kg) (06/15 0325)  CBC:   Recent Labs Lab 03/11/17 0339 03/12/17 0409  WBC 12.6* 11.2*  HGB 10.6* 11.0*  HCT 32.1* 34.5*  MCV 82.5 82.7  PLT 167 937    Basic Metabolic Panel:   Recent Labs Lab 03/10/17 0335 03/11/17 0339 03/12/17 0409  NA 143 143 144  K 4.1 3.7 3.6  CL 109 104 105  CO2 26 29 29   GLUCOSE 193* 193* 143*  BUN 32* 26* 27*  CREATININE 0.91 0.78 0.85  CALCIUM 8.5* 9.2 9.3  MG 2.4  --  2.1  PHOS 4.3  --  3.9    Lipid Panel:     Component Value Date/Time   CHOL 140 12/03/2016 0409   TRIG 46 03/02/2017 1010   HDL 51 12/03/2016 0409   CHOLHDL 2.7 12/03/2016 0409   VLDL 18 12/03/2016 0409   LDLCALC 71 12/03/2016 0409   HgbA1c:  Lab Results  Component Value Date   HGBA1C 11.9 (H) 02/08/2017   Urine Drug Screen:     Component Value Date/Time   LABOPIA NONE DETECTED 03/03/2017 0940   COCAINSCRNUR NONE DETECTED 03/21/2017 0940   LABBENZ NONE DETECTED 03/24/2017 0940   AMPHETMU NONE DETECTED 03/09/2017 0940   THCU NONE DETECTED 03/26/2017 0940   LABBARB NONE DETECTED 03/25/2017 0940    Alcohol Level     Component Value Date/Time   ETH <5 03/26/2017 0954    IMAGING  I have personally reviewed the radiological images below and agree with the radiology interpretations.  Ct Head Wo Contrast 03/02/2017 0706 Overall relatively similar exam to yesterday. Left thalamic 2.2 x  2 x 1.6 cm hematoma with surrounding vasogenic edema minimally changed from the prior examination when this measured 2.1 x 2 x 1.5 cm. Mass-effect upon the left lateral ventricle and third ventricle with 3 mm midline shift to the right. No development of hydrocephalus. 8 mm rounded hyperdensity right occipital lobe (series 5, image 54) unchanged to from 02/08/2017 and possibly related to remote hemorrhage. Chronic microvascular changes. Global atrophy.   Mr Brain Wo Contrast 03/14/2017 2221 1. 2.0 x 1.7 x 1.9 cm acute left thalamic hemorrhage. Associated vasogenic edema with localized 3 mm of left-to-right midline shift. No hydrocephalus or evidence for ventricular trapping. No intraventricular extension. Hypertensive etiology is suspected. 2. Innumerable scattered foci of chronic hemorrhages involving the posterior bilateral cerebral hemispheres, deep gray nuclei, brainstem, and cerebellum. Distribution favors chronic underlying hypertension. 3. Small remote right thalamic lacunar infarcts with additional small remote left cerebellar infarct. 4. Generalized age-related cerebral atrophy with moderate chronic small vessel ischemic disease.   Ct Head Wo Contrast 03/23/2017 1049 2 cm left thalamic hematoma (~3 cc). Associated edema and local mild mass-effect.   CUS - 02/09/17 - Bilateral: Negative bilateral carotid duplex ultrasound.[name]  TTE - Normal LV size with mild LV hypertrophy. EF 60-65%. Moderate   diastolic dysfunction. Normal RV  size and systolic function. No   significant valvular abnormalities. Left atrial enlargement.  Ct Head Wo Contrast 03/04/2017 IMPRESSION: 1. Stable size of left thalamic hemorrhage, measuring 2.2 x 2.0 x 1.7 cm. Associated vasogenic edema is relatively similar with similar mass effect on the adjacent left lateral and third ventricles and stable 3 mm of left-to-right shift. No hydrocephalus or ventricular trapping. 2. No other new acute intracranial process.   Ct Head Wo  Contrast 03/05/2017 1. Continued stable hemorrhage centered at the left ventral thalamus with estimated blood volume of 5 mL. Surrounding edema and mild regional mass effect are stable. 2. No new acute intracranial abnormality.   DG Left Hand Complete 03/07/2017 No acute abnormality.   PHYSICAL EXAM  Temp:  [97.6 F (36.4 C)-100.8 F (38.2 C)] 98.9 F (37.2 C) (06/15 0825) Pulse Rate:  [68-115] 91 (06/15 0758) Resp:  [12-24] 19 (06/15 0758) BP: (104-196)/(61-131) 145/82 (06/15 0700) SpO2:  [97 %-100 %] 100 % (06/15 0758) FiO2 (%):  [40 %] 40 % (06/15 0724) Weight:  [173 lb 15.1 oz (78.9 kg)] 173 lb 15.1 oz (78.9 kg) (06/15 0325)  General - frail elderly African-American lady, who is still intubated  Ophthalmologic - Fundi not visualized due to noncooperation.  Cardiovascular - Regular rate and rhythm, not in afib.  Neuro -  Intubated and sedated. Not following commands. NAD. PERRL but sluggish. Opens eyes to stim. . Globally aphasic. Blinks to threat on the left. Tongue midline. Cough and gag weak. Motor system exam reveals right NL fold flattening. Right-sided weakness.  Increased tone . W/d in the bilateral extremities and right arm not on the left UE.  ASSESSMENT/PLAN Kristine Boyer is a 76 y.o. female with history of AF on eliquis, baseline dementia, HTN, HLD, DM, BPPV, bipolar presenting with slurred speech and R hemiparesis. CT showed a L thalamic hemorrhage.   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   CT head L thalamic hemorrhage with mild edema and mild mass effect  MRI head L thalamic hemorrhage w/ 40mm left to right shift. Scattered microhemorrhages. 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 Small vessel disease. Atrophy.   Repeat CT x 2 no significant change, no increase in hmg or hydrocephalus  Carotid Doppler   01/2017 Unremarkable   2D Echo  EF 60-65%  LDL 71 in March  HgbA1c 11.9 in May  SCDs for VTE prophylaxis    Eliquis (apixaban) daily prior to admission, now on No antithrombotic given hemorrhage. May resume Eliquis when blood has resolved.  Ongoing aggressive stroke risk factor management  Therapy recommendations:  pending  Disposition:  pending   Paroxysmal Atrial Fibrillation  Home anticoagulation:  Eliquis (apixaban) daily - last dose 5 days prior to admission  No an AC candidate now d/t hemorrhage  Home metoprolol 200 q 24h  CHA2DS2-VASc Score = at least 7  Age in Years:  ?3   +2    Sex:  Female   Female   +1    Hypertension History:  yes   +1     Diabetes Mellitus:  yes   +1   Congestive Heart Failure History:  0  Vascular Disease History:  0  Stroke/TIA/Thromboembolism History:  yes   +2  Will consider anticoagulation options once blood absorbed.   Hypertension  Home med: diovan and metoprolol  BP stable   SBP goal <  160  Off Cleviprex  Resumed ibersartan and metoprolol  Add amlodipine for better BP control  Continue to monitor  Hyperlipidemia  Home meds:  lipitor 40  LDL 71 in March, almost at goal < 70  Continue statin at discharge  Diabetes  Home med: glucotrol  HgbA1c 11.9 in May, goal < 7.0  Uncontrolled  SSI  Dysphagia   On dys 1 diet with thin liquid but now intubated  Baseline dysphagia due to unclear etiology  Speech to follow  Other Stroke Risk Factors  Advanced age  UDS / ETOH level negative   Obesity, Body mass index is 30.81 kg/m., recommend weight loss, diet and exercise as appropriate   Other Active Problems  Baseline dementia - seroquel, desyrel  Bipolar  Hypothyroid on synthroid  Constipation on miralax, senna   Left hand pain and swelling -> no abnormality on x-ray. Likely osteoarthritis  Hypokalemia - 3.3 - supplement - recheck Tuesday  Mild anemia - stable   Hospital day # 11   This  patient is critically ill and at significant risk of neurological worsening, death and care requires constant monitoring of vital signs, hemodynamics,respiratory and cardiac monitoring,review of multiple databases, neurological assessment, discussion with family, other specialists and medical decision making of high complexity.  Patient was to be extubated yesterday but the weaning was discontinued due to her poor mental status, discussed with daughter at length yesterday; when patient is extubated will not re-intubate, patient would not want a trach or peg.   I spent 30 minutes of neurocritical care time in the care of this patient.  Sarina Ill, MD [facility]  To contact Stroke Continuity provider, please refer to [email or web address] After hours, contact General Neurology

## 2017-03-12 NOTE — Progress Notes (Signed)
PULMONARY / CRITICAL CARE MEDICINE   Name: Kristine Boyer MRN: [id number] DOB: 06-26-1941    ADMISSION DATE:  03/22/2017 CONSULTATION DATE:  03/08/2017  REFERRING MD:  Dr. Pearlean Brownie  CHIEF COMPLAINT:  Respiratory distress  HISTORY OF PRESENT ILLNESS:   76 year old female with PMH significant for dementia, bipolar disorder, DM, HTN, and hypothyroid. At baseline she requires assisstance with multiple ADLs and is able to feed/dress herself. She cannot prepare the food or pick out her clothes.  She was admitted 6/4 to [facility] with left thalamic ICH.She is on Eliquis for history of AfibShe was admitted to the stroke service. Bleed was thought to be hypertensive in nature.   Since admission she had been improving. Resultant expressive aphasia and R hemiparesis persisted, but she was able to be started back on her home mediactions and was able to take PO with a restricted diet. On 6/11 she developed respiratory distress & required intubation for presumed aspiration pneumonia   SUBJECTIVE:   Still slow to wake up Triggered apnea alarm during SBT attempt    VITAL SIGNS: BP (!) 145/82   Pulse 91   Temp 98.9 F (37.2 C) (Oral)   Resp 19   Ht 5\' 3"  (1.6 m)   Wt 173 lb 15.1 oz (78.9 kg)   SpO2 100%   BMI 30.81 kg/m   HEMODYNAMICS:    VENTILATOR SETTINGS: Vent Mode: PRVC FiO2 (%):  [40 %] 40 % Set Rate:  [18 bmp] 18 bmp Vt Set:  [420 mL] 420 mL PEEP:  [5 cmH20] 5 cmH20 Pressure Support:  [5 cmH20] 5 cmH20 Plateau Pressure:  [14 cmH20-15 cmH20] 15 cmH20  INTAKE / OUTPUT:  Intake/Output Summary (Last 24 hours) at 03/12/17 0858 Last data filed at 03/12/17 0700  Gross per 24 hour  Intake             1310 ml  Output             1220 ml  Net               90 ml     General appearance:  76 Year old  female, well nourished, still sedated Eyes: anicteric sclerae, moist conjunctivae; PERRL, EOMI bilaterally. Mouth:  membranes and no mucosal ulcerations; normal hard and soft  palate Neck: Trachea midline; neck supple, no JVD Lungs/chest: occasional rhonchi, with normal respiratory effort and no intercostal retractions CV: RRR, no MRGs  Abdomen: Soft, non-tender; no masses or HSM Extremities: No peripheral edema or extremity lymphadenopathy Skin: Normal temperature, turgor and texture; no rash, ulcers or subcutaneous nodules Neuro/Psych: sedated. Intermittently will follow commands. Opens eyes to loud request   LABS:  BMET  Recent Labs Lab 03/10/17 0335 03/11/17 0339 03/12/17 0409  NA 143 143 144  K 4.1 3.7 3.6  CL 109 104 105  CO2 26 29 29   BUN 32* 26* 27*  CREATININE 0.91 0.78 0.85  GLUCOSE 193* 193* 143*    Electrolytes  Recent Labs Lab 03/09/17 0529 03/10/17 0335 03/11/17 0339 03/12/17 0409  CALCIUM 9.1 8.5* 9.2 9.3  MG 2.2 2.4  --  2.1  PHOS 4.2 4.3  --  3.9    CBC  Recent Labs Lab 03/10/17 0335 03/11/17 0339 03/12/17 0409  WBC 12.5* 12.6* 11.2*  HGB 9.8* 10.6* 11.0*  HCT 30.1* 32.1* 34.5*  PLT 166 167 190    Coag's No results for input(s): APTT, INR in the last 168 hours.  Sepsis Markers  Recent Labs Lab  03/11/17 1027 03/12/17 0409  PROCALCITON <0.10 <0.10    ABG  Recent Labs Lab 03/08/17 1010 03/09/17 0409 03/10/17 0350  PHART 7.360 7.393 7.338*  PCO2ART 49.5* 44.7 53.0*  PO2ART 440* 149* 148*    Liver Enzymes No results for input(s): AST, ALT, ALKPHOS, BILITOT, ALBUMIN in the last 168 hours.  Cardiac Enzymes No results for input(s): TROPONINI, PROBNP in the last 168 hours.  Glucose  Recent Labs Lab 03/11/17 1133 03/11/17 1620 03/11/17 2117 03/12/17 0008 03/12/17 0340 03/12/17 0821  GLUCAP 202* 178* 191* 232* 139* 189*    Imaging Dg Chest Port 1 View  Result Date: 03/12/2017 CLINICAL DATA:  Acute respiratory failure, acute intracranial hemorrhage. Acute on chronic CHF. EXAM: PORTABLE CHEST 1 VIEW COMPARISON:  Portable chest x-ray of March 10, 2017 FINDINGS: The lungs are well-expanded.  There is no focal infiltrate. Density in the right mid lung is felt be related to overlying support appliances. No abnormal right apical density is observed today. Stable mildly increased retrocardiac density on the left. The heart and pulmonary vascularity are normal. The feeding tube tip projects below the inferior margin of the image. The endotracheal tube tip lies 3.6 cm above the carina. IMPRESSION: Stable appearance of the chest since the study of 2 days ago. Subsegmental atelectasis in the left lower lobe. No right apical abnormality is observed today. Electronically Signed   By: David  [place] M.D.   On: 03/12/2017 07:18     STUDIES:  6/5 CT head > Overall relatively similar exam to yesterday. Left thalamic 2.2 x 2 x 1.6 cm hematoma with surrounding vasogenic edema minimally changed from the prior examination when this measured 2.1 x 2 x 1.5 cm. Mass-effect upon the left lateral ventricle and third ventricle with 3 mm midline shift to the right. No development of hydrocephalus. 8 mm rounded hyperdensity right occipital lobe (series 5, image 54) unchanged to from 02/08/2017 and possibly related to remote hemorrhage. Chronic microvascular changes. Global atrophy.  6/4 MRI brain > 1. 2.0 x 1.7 x 1.9 cm acute left thalamic hemorrhage. Associated vasogenic edema with localized 3 mm of left-to-right midline shift. No hydrocephalus or evidence for ventricular trapping. No intraventricular extension. Hypertensive etiology is suspected. 2. Innumerable scattered foci of chronic hemorrhages involving the posterior bilateral cerebral hemispheres, deep gray nuclei, brainstem, and cerebellum. Distribution favors chronic underlying hypertension. 3. Small remote right thalamic lacunar infarcts with additional small remote left cerebellar infarct. 4. Generalized age-related cerebral atrophy with moderate chronic small vessel ischemic disease.  6/4 Ct Head > 2 cm left thalamic hematoma (~3 cc). Associated edema and  local mild mass-effect.  TTE - Normal LV size with mild LV hypertrophy. EF 60-65%. Moderate diastolic dysfunction. Normal RV size and systolic function. Nosignificant valvular abnormalities. Left atrial enlargement. 6/7 CT head >. Stable size of left thalamic hemorrhage, measuring 2.2 x 2.0 x 1.7 cm. Associated vasogenic edema is relatively similar with similar mass effect on the adjacent left lateral and third ventricles and stable 3 mm of left-to-right shift. No hydrocephalus or ventricular trapping. 2. No other new acute intracranial process.  6/08 CT head> Continued stable hemorrhage centered at the left ventral thalamus with estimated blood volume of 5 mL. Surrounding edema and mild regional mass effect are stable. No new acute intracranial abnormality. CT head 6/12 > No enlargement of left thalamic IPH.  CULTURES: Tracheal asp 6/11: NPF  ANTIBIOTICS: vanc 6/12>>>6/12 unasyn 6/12>>>  SIGNIFICANT EVENTS: 6/11 > aspiration, transferred to ICU.  LINES/TUBES: ETT 6/11 >  DISCUSSION: 76 year old female admitted 6/4 for ICH ,6/11 Developed respiratory distress requiring intubation. Likely due to aspiration, DNR status.  Tmax 98.9 Wbc ct trending down MS is barrier to extubation still.   ASSESSMENT / PLAN:  NEUROLOGIC A:   Left thalamic ICH with midline shift Expressive aphasia R weakness H/o dementia  P:   Stopping sedating meds Cont hs Seroquel   PULMONARY A: Acute hypoxemic respiratory failure secondary to suspected aspiration (has baseline dysphagia d/t bone spurs) - s/p intubation 6/11 pcxr personally reviewed: improved aeration.  P:   Resume SBT as soon as MS allows Do not re-intubate s/p extubation Needs repeat swallow eval   CARDIOVASCULAR A:  HTN Atrial fibrillation HL P:  Holding NOAC d/t ICH Cont tele  Norvasc, avapro, metoprolol scheduled   RENAL A:   Fluid and electrolyte d/o AKI-->resolved P:   Trend PRN bmp I&O  GASTROINTESTINAL A:    Dysphagia - had been on dysphagia 1 diet prior to intubation -has h/o bone spurs w/ resultant dysphagia at BL P:   Tube feeds on hold for potential extubation  ppi Needs repeat swallow eval   HEMATOLOGIC A:   Anemia VTE prophylaxis P:  Cont Delia heparin  Prn cbc  INFECTIOUS A:   Probable aspiration - sputum w/ NPF P:   Complete 7d unasyn (now day 4)  ENDOCRINE A:   DM Hypothyroidism P:   Cont synthroid and ssi   FAMILY  - Updates: daughter updated at bedside, DNR noted, will have to clarify reintubation  - Inter-disciplinary family meet or Palliative Care meeting due by:  6/18  My cc time 76m Simonne Martinet ACNP-BC [facility] Pulmonary/Critical Care Pager # [phone] OR # [phone] if no answer

## 2017-03-13 LAB — GLUCOSE, CAPILLARY
Glucose-Capillary: 137 mg/dL — ABNORMAL HIGH (ref 65–99)
Glucose-Capillary: 139 mg/dL — ABNORMAL HIGH (ref 65–99)
Glucose-Capillary: 151 mg/dL — ABNORMAL HIGH (ref 65–99)
Glucose-Capillary: 153 mg/dL — ABNORMAL HIGH (ref 65–99)
Glucose-Capillary: 187 mg/dL — ABNORMAL HIGH (ref 65–99)
Glucose-Capillary: 236 mg/dL — ABNORMAL HIGH (ref 65–99)

## 2017-03-13 LAB — CULTURE, BLOOD (ROUTINE X 2)
CULTURE: NO GROWTH
Culture: NO GROWTH
Special Requests: ADEQUATE
Special Requests: ADEQUATE

## 2017-03-13 LAB — PROCALCITONIN

## 2017-03-13 MED ORDER — ORAL CARE MOUTH RINSE
15.0000 mL | Freq: Two times a day (BID) | OROMUCOSAL | Status: DC
  Administered 2017-03-13 – 2017-03-14 (×4): 15 mL via OROMUCOSAL

## 2017-03-13 NOTE — Progress Notes (Addendum)
STROKE TEAM PROGRESS NOTE   SUBJECTIVE (INTERVAL HISTORY) The patient has been extubated. She is more responsive. She does not report any complaints.   OBJECTIVE Temp:  [97.8 F (36.6 C)-100.1 F (37.8 C)] 98.4 F (36.9 C) (06/16 0400) Pulse Rate:  [72-109] 82 (06/16 0700) Cardiac Rhythm: Heart block (06/16 0400) Resp:  [12-27] 21 (06/16 0700) BP: (123-184)/(65-111) 151/78 (06/16 0700) SpO2:  [92 %-100 %] 100 % (06/16 0700) FiO2 (%):  [40 %] 40 % (06/15 1200) Weight:  [77.8 kg (171 lb 8.3 oz)] 77.8 kg (171 lb 8.3 oz) (06/16 0319)  CBC:   Recent Labs Lab 03/11/17 0339 03/12/17 0409  WBC 12.6* 11.2*  HGB 10.6* 11.0*  HCT 32.1* 34.5*  MCV 82.5 82.7  PLT 167 062    Basic Metabolic Panel:   Recent Labs Lab 03/10/17 0335 03/11/17 0339 03/12/17 0409  NA 143 143 144  K 4.1 3.7 3.6  CL 109 104 105  CO2 26 29 29   GLUCOSE 193* 193* 143*  BUN 32* 26* 27*  CREATININE 0.91 0.78 0.85  CALCIUM 8.5* 9.2 9.3  MG 2.4  --  2.1  PHOS 4.3  --  3.9    Lipid Panel:     Component Value Date/Time   CHOL 140 12/03/2016 0409   TRIG 46 03/02/2017 1010   HDL 51 12/03/2016 0409   CHOLHDL 2.7 12/03/2016 0409   VLDL 18 12/03/2016 0409   LDLCALC 71 12/03/2016 0409   HgbA1c:  Lab Results  Component Value Date   HGBA1C 11.9 (H) 02/08/2017   Urine Drug Screen:     Component Value Date/Time   LABOPIA NONE DETECTED 03/15/2017 0940   COCAINSCRNUR NONE DETECTED 02/26/2017 0940   LABBENZ NONE DETECTED 02/26/2017 0940   AMPHETMU NONE DETECTED 03/10/2017 0940   THCU NONE DETECTED 03/13/2017 0940   LABBARB NONE DETECTED 03/23/2017 0940    Alcohol Level     Component Value Date/Time   ETH <5 03/10/2017 0954    IMAGING  Ct Head Wo Contrast 03/08/2017 No unexpected finding. No enlargement of the left thalamic intraparenchymal hematoma, which is becoming less dense in normal evolutionary fashion.   Ct Head Wo Contrast 03/02/2017 0706 Overall relatively similar exam to  yesterday. Left thalamic 2.2 x 2 x 1.6 cm hematoma with surrounding vasogenic edema minimally changed from the prior examination when this measured 2.1 x 2 x 1.5 cm. Mass-effect upon the left lateral ventricle and third ventricle with 3 mm midline shift to the right. No development of hydrocephalus. 8 mm rounded hyperdensity right occipital lobe (series 5, image 54) unchanged to from 02/08/2017 and possibly related to remote hemorrhage. Chronic microvascular changes. Global atrophy.    Mr Brain Wo Contrast 03/11/2017 2221 1. 2.0 x 1.7 x 1.9 cm acute left thalamic hemorrhage. Associated vasogenic edema with localized 3 mm of left-to-right midline shift. No hydrocephalus or evidence for ventricular trapping. No intraventricular extension. Hypertensive etiology is suspected. 2. Innumerable scattered foci of chronic hemorrhages involving the posterior bilateral cerebral hemispheres, deep gray nuclei, brainstem, and cerebellum. Distribution favors chronic underlying hypertension. 3. Small remote right thalamic lacunar infarcts with additional small remote left cerebellar infarct. 4. Generalized age-related cerebral atrophy with moderate chronic small vessel ischemic disease.    Ct Head Wo Contrast 03/09/2017 1049 2 cm left thalamic hematoma (~3 cc). Associated edema and local mild mass-effect.    CUS - 02/09/17 - Bilateral: Negative bilateral carotid duplex ultrasound.[name]   TTE - Normal LV size with mild LV hypertrophy. EF  60-65%. Moderate   diastolic dysfunction. Normal RV size and systolic function. No   significant valvular abnormalities. Left atrial enlargement.   Ct Head Wo Contrast 03/04/2017 IMPRESSION: 1. Stable size of left thalamic hemorrhage, measuring 2.2 x 2.0 x 1.7 cm. Associated vasogenic edema is relatively similar with similar mass effect on the adjacent left lateral and third ventricles and stable 3 mm of left-to-right shift. No hydrocephalus or ventricular trapping. 2. No other new acute  intracranial process.    Ct Head Wo Contrast 03/05/2017 1. Continued stable hemorrhage centered at the left ventral thalamus with estimated blood volume of 5 mL. Surrounding edema and mild regional mass effect are stable. 2. No new acute intracranial abnormality.   DG Left Hand Complete 03/07/2017 No acute abnormality.   PHYSICAL EXAM  Temp:  [97.8 F (36.6 C)-100.1 F (37.8 C)] 98.4 F (36.9 C) (06/16 0400) Pulse Rate:  [72-109] 82 (06/16 0700) Resp:  [12-27] 21 (06/16 0700) BP: (123-184)/(65-111) 151/78 (06/16 0700) SpO2:  [92 %-100 %] 100 % (06/16 0700) FiO2 (%):  [40 %] 40 % (06/15 1200) Weight:  [77.8 kg (171 lb 8.3 oz)] 77.8 kg (171 lb 8.3 oz) (06/16 0319)  GENERAL: She is extubated. She is doing fair.  HEENT: Normal  ABDOMEN: soft  EXTREMITIES: No edema   BACK: Normal  SKIN: Normal by inspection.    MENTAL STATUS: She is awake and responsive. She does follow commands intermittently involving the left upper extremity. She does affirms yes or no but has no verbal output.  CRANIAL NERVES: Pupils are equal, round and reactive to light and accomodation; extra ocular movements are full, there is no significant nystagmus; visual fields are full; upper and lower facial muscles are normal in strength and symmetric, there is no flattening of the nasolabial folds.  MOTOR: She moves to the left upper extremity 3/5. She moves the right upper extremity at the strength of 2/5. No movements in the legs.  COORDINATION: Left finger to nose is normal, right finger to nose is normal, No rest tremor; no intention tremor; no postural tremor; no bradykinesia.   The brain MRI is reviewed in person and shows a moderate L thalamic  seen on SWI with reduced signal. There is several small areas of reduced signal on DWI involving the midbrain and occipital lobes bilaterally. There is evidence of moderate global atrophy.    ASSESSMENT/PLAN Kristine Boyer is a 76 y.o. female with  history of AF on eliquis, baseline dementia, HTN, HLD, DM, BPPV, bipolar presenting with slurred speech and R hemiparesis. CT showed a L thalamic hemorrhage.  The patient is not a candidate for long-term anticoagulation but can restart antiplatelet agent one month from now. Blood pressure control long-term is important. Goal is recommended at 135/85. Follow-up is recommended in the stroke clinic in about 1-2 months. We'll sign off reconsult as needed.  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   CT head L thalamic hemorrhage with mild edema and mild mass effect  MRI head L thalamic hemorrhage w/ 86mm left to right shift. Scattered microhemorrhages. 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 Small vessel disease. Atrophy.   Repeat CT x 2 no significant change, no increase in hmg or hydrocephalus  Carotid Doppler  01/2017 Unremarkable   2D Echo  EF 60-65%  LDL 71 in March  HgbA1c 11.9 in May  SCDs  for VTE prophylaxis    Eliquis (apixaban) daily prior to admission, now on No antithrombotic given hemorrhage. The patient is not a candidate for anticoagulation given the intracranial hemorrhage on this admission and imaging showing previous hemorrhage. The patient can be restarted on antiplatelet agents in about one month from now.  Ongoing aggressive stroke risk factor management  Therapy recommendations:  pending  Disposition:  pending   Paroxysmal Atrial Fibrillation  Home anticoagulation:  Eliquis (apixaban) daily - last dose 5 days prior to admission  No an AC candidate now d/t hemorrhage  Home metoprolol 200 q 24h  CHA2DS2-VASc Score = at least 7  Age in Years:  ?42   +2    Sex:  Female   Female   +1    Hypertension History:  yes   +1     Diabetes Mellitus:  yes   +1   Congestive Heart Failure History:  0  Vascular Disease History:  0   Stroke/TIA/Thromboembolism History:  yes   +2  Will consider anticoagulation options once blood absorbed.   Hypertension  Home med: diovan and metoprolol  BP stable   SBP goal < 160  Off Cleviprex  Resumed ibersartan and metoprolol  Add amlodipine for better BP control  Continue to monitor - still mildly high at times.   Hyperlipidemia  Home meds:  lipitor 40  LDL 71 in March, almost at goal < 70  Continue statin at discharge  Diabetes  Home med: glucotrol  HgbA1c 11.9 in May, goal < 7.0  Uncontrolled  SSI  Dysphagia   On dys 1 diet with thin liquid but now intubated  Baseline dysphagia due to unclear etiology  Speech to follow  Other Stroke Risk Factors  Advanced age  UDS / ETOH level negative   Obesity, Body mass index is 30.38 kg/m., recommend weight loss, diet and exercise as appropriate   Other Active Problems  Baseline dementia - seroquel, desyrel  Bipolar  Hypothyroid on synthroid  Constipation on miralax, senna   Left hand pain and swelling -> no abnormality on x-ray. Likely osteoarthritis  Hypokalemia - 3.3 - supplement - recheck Tuesday  Mild anemia - stable  Patient was to be extubated 03/11/2017 but the weaning was discontinued due to her poor mental status.  Discussed with daughter at length; -> when patient is extubated will not re-intubate. Pt would not want a trach or peg.    Hospital day # 12    To contact Stroke Continuity provider, please refer to [email or web address] After hours, contact General Neurology

## 2017-03-13 NOTE — Progress Notes (Signed)
PULMONARY / CRITICAL CARE MEDICINE   Name: Kristine Boyer MRN: [id number] DOB: 26-Nov-1940    ADMISSION DATE:  02/28/2017 CONSULTATION DATE:  03/08/2017  REFERRING MD:  Dr. Leonie Man  CHIEF COMPLAINT:  Respiratory distress  HISTORY OF PRESENT ILLNESS:   76 year old diabetic hypertensive, dementia with bipolar disorder, was recovering from a left thalamic hemorrhage admitted 6/4 and then developed lobe aspiration pneumonitis requiring mechanical ventilation 6/11  STUDIES:  6/5 CT head > Overall relatively similar exam to yesterday. Left thalamic 2.2 x 2 x 1.6 cm hematoma with surrounding vasogenic edema minimally changed from the prior examination when this measured 2.1 x 2 x 1.5 cm. Mass-effect upon the left lateral ventricle and third ventricle with 3 mm midline shift to the right. No development of hydrocephalus. 8 mm rounded hyperdensity right occipital lobe (series 5, image 54) unchanged to from 02/08/2017 and possibly related to remote hemorrhage. Chronic microvascular changes. Global atrophy.  6/4 MRI brain > 1. 2.0 x 1.7 x 1.9 cm acute left thalamic hemorrhage. Associated vasogenic edema with localized 3 mm of left-to-right midline shift. No hydrocephalus or evidence for ventricular trapping. No intraventricular extension. Hypertensive etiology is suspected. 2. Innumerable scattered foci of chronic hemorrhages involving the posterior bilateral cerebral hemispheres, deep gray nuclei, brainstem, and cerebellum. Distribution favors chronic underlying hypertension. 3. Small remote right thalamic lacunar infarcts with additional small remote left cerebellar infarct. 4. Generalized age-related cerebral atrophy with moderate chronic small vessel ischemic disease.  6/4 Ct Head > 2 cm left thalamic hematoma (~3 cc). Associated edema and local mild mass-effect.  TTE - Normal LV size with mild LV hypertrophy. EF 60-65%. Moderate diastolic dysfunction. Normal RV size and systolic function. Nosignificant  valvular abnormalities. Left atrial enlargement. 6/7 CT head >. Stable size of left thalamic hemorrhage, measuring 2.2 x 2.0 x 1.7 cm. Associated vasogenic edema is relatively similar with similar mass effect on the adjacent left lateral and third ventricles and stable 3 mm of left-to-right shift. No hydrocephalus or ventricular trapping. 2. No other new acute intracranial process.  6/08 CT head> Continued stable hemorrhage centered at the left ventral thalamus with estimated blood volume of 5 mL. Surrounding edema and mild regional mass effect are stable. No new acute intracranial abnormality. CT head 6/12 > No enlargement of left thalamic IPH.  CULTURES: Tracheal asp 6/11: NPF  ANTIBIOTICS: vanc 6/12>>>6/12 unasyn 6/12>>>  SIGNIFICANT EVENTS: 6/11 > aspiration, transferred to ICU.  LINES/TUBES: ETT 6/11 >6/15   SUBJECTIVE:   Has done well since extubation on 6/15 No resp distress ,more awake Getting neb rx   VITAL SIGNS: BP (!) 163/80   Pulse 75   Temp 98.2 F (36.8 C) (Oral)   Resp 18   Ht 5\' 3"  (1.6 m)   Wt 171 lb 8.3 oz (77.8 kg)   SpO2 100%   BMI 30.38 kg/m   HEMODYNAMICS:    VENTILATOR SETTINGS: Vent Mode: PSV;CPAP FiO2 (%):  [40 %] 40 % PEEP:  [5 cmH20] 5 cmH20 Pressure Support:  [8 cmH20] 8 cmH20  INTAKE / OUTPUT:  Intake/Output Summary (Last 24 hours) at 03/13/17 0932 Last data filed at 03/13/17 0700  Gross per 24 hour  Intake          1422.67 ml  Output             2475 ml  Net         -1052.33 ml     General appearance: elderly  female, well nourished Eyes: anicteric sclerae, moist conjunctivae;  PERRL, EOMI bilaterally. Mouth: moist,no mucosal ulcerations; normal hard and soft palate Neck: Trachea midline; neck supple, no JVD Lungs/chest: occasional rhonchi, with normal respiratory effort and no intercostal retractions CV: RRR, no MRGs  Abdomen: Soft, non-tender; no masses or HSM Extremities: No peripheral edema or extremity  lymphadenopathy Skin: Normal temperature, turgor and texture; no rash, ulcers or subcutaneous nodules Neuro/Psych:  follow commands. Opens eyes to loud request   LABS:  BMET  Recent Labs Lab 03/10/17 0335 03/11/17 0339 03/12/17 0409  NA 143 143 144  K 4.1 3.7 3.6  CL 109 104 105  CO2 26 29 29   BUN 32* 26* 27*  CREATININE 0.91 0.78 0.85  GLUCOSE 193* 193* 143*    Electrolytes  Recent Labs Lab 03/09/17 0529 03/10/17 0335 03/11/17 0339 03/12/17 0409  CALCIUM 9.1 8.5* 9.2 9.3  MG 2.2 2.4  --  2.1  PHOS 4.2 4.3  --  3.9    CBC  Recent Labs Lab 03/10/17 0335 03/11/17 0339 03/12/17 0409  WBC 12.5* 12.6* 11.2*  HGB 9.8* 10.6* 11.0*  HCT 30.1* 32.1* 34.5*  PLT 166 167 190    Coag's No results for input(s): APTT, INR in the last 168 hours.  Sepsis Markers  Recent Labs Lab 03/11/17 1027 03/12/17 0409 03/13/17 0230  PROCALCITON <0.10 <0.10 <0.10    ABG  Recent Labs Lab 03/08/17 1010 03/09/17 0409 03/10/17 0350  PHART 7.360 7.393 7.338*  PCO2ART 49.5* 44.7 53.0*  PO2ART 440* 149* 148*    Liver Enzymes No results for input(s): AST, ALT, ALKPHOS, BILITOT, ALBUMIN in the last 168 hours.  Cardiac Enzymes No results for input(s): TROPONINI, PROBNP in the last 168 hours.  Glucose  Recent Labs Lab 03/12/17 1222 03/12/17 1643 03/12/17 2047 03/13/17 0022 03/13/17 0453 03/13/17 0858  GLUCAP 188* 118* 217* 236* 139* 137*    Imaging No results found.    DISCUSSION: 75 year old female admitted 6/4 for ICH ,6/11 Developed respiratory distress requiring intubation. Likely due to aspiration, DNR status.    ASSESSMENT / PLAN:  NEUROLOGIC A:   Left thalamic ICH with midline shift Expressive aphasia R weakness H/o dementia  P:   Cont hs Seroquel  PT if she improves  PULMONARY A: Acute hypoxemic respiratory failure secondary to suspected aspiration (has baseline dysphagia d/t bone spurs) - s/p intubation 6/11 pcxr personally reviewed:  improved aeration.  P:   NO reintubation     CARDIOVASCULAR A:  HTN Atrial fibrillation HL P:  Holding NOAC d/t ICH Cont tele  Norvasc, avapro, metoprolol scheduled   RENAL A:   AKI-->resolved P:   Trend PRN bmp I&O  GASTROINTESTINAL A:   Dysphagia - had been on dysphagia 1 diet prior to intubation -has h/o bone spurs w/ resultant dysphagia at BL P:   Ct Tube feeds  ppi Needs repeat swallow eval   HEMATOLOGIC A:   Anemia VTE prophylaxis P:  Cont [place] heparin    INFECTIOUS A:   Probable aspiration - sputum w/ NPF P:   Complete 5d unasyn, low pct reassuring   ENDOCRINE A:   DM Hypothyroidism P:   Cont synthroid and ssi   FAMILY  - Updates: daughter updated at bedside, DNR noted  - Inter-disciplinary family meet or Palliative Care meeting due by:  6/18  Transfer to tele & to [facility] 6/17

## 2017-03-14 ENCOUNTER — Encounter (HOSPITAL_COMMUNITY): Payer: Self-pay | Admitting: General Practice

## 2017-03-14 ENCOUNTER — Other Ambulatory Visit: Payer: Self-pay | Admitting: Internal Medicine

## 2017-03-14 DIAGNOSIS — I482 Chronic atrial fibrillation: Secondary | ICD-10-CM

## 2017-03-14 DIAGNOSIS — E039 Hypothyroidism, unspecified: Secondary | ICD-10-CM

## 2017-03-14 LAB — BASIC METABOLIC PANEL
ANION GAP: 8 (ref 5–15)
BUN: 25 mg/dL — AB (ref 6–20)
CALCIUM: 9.2 mg/dL (ref 8.9–10.3)
CO2: 31 mmol/L (ref 22–32)
Chloride: 107 mmol/L (ref 101–111)
Creatinine, Ser: 0.74 mg/dL (ref 0.44–1.00)
GFR calc Af Amer: >60 mL/min (ref >60)
GLUCOSE: 148 mg/dL — AB (ref 65–99)
Potassium: 3.6 mmol/L (ref 3.5–5.1)
SODIUM: 146 mmol/L — AB (ref 135–145)

## 2017-03-14 LAB — GLUCOSE, CAPILLARY
GLUCOSE-CAPILLARY: 235 mg/dL — AB (ref 65–99)
GLUCOSE-CAPILLARY: 259 mg/dL — AB (ref 65–99)
Glucose-Capillary: 145 mg/dL — ABNORMAL HIGH (ref 65–99)
Glucose-Capillary: 216 mg/dL — ABNORMAL HIGH (ref 65–99)
Glucose-Capillary: 199 mg/dL — ABNORMAL HIGH (ref 65–99)
Glucose-Capillary: 189 mg/dL — ABNORMAL HIGH (ref 65–99)

## 2017-03-14 LAB — CBC
HCT: 33.5 % — ABNORMAL LOW (ref 36.0–46.0)
Hemoglobin: 10.6 g/dL — ABNORMAL LOW (ref 12.0–15.0)
MCH: 26.2 pg (ref 26.0–34.0)
MCHC: 31.6 g/dL (ref 30.0–36.0)
MCV: 82.9 fL (ref 78.0–100.0)
PLATELETS: 217 10*3/uL (ref 150–400)
RBC: 4.04 MIL/uL (ref 3.87–5.11)
RDW: 13.4 % (ref 11.5–15.5)
WBC: 9 10*3/uL (ref 4.0–10.5)

## 2017-03-14 LAB — MAGNESIUM: MAGNESIUM: 2.3 mg/dL (ref 1.7–2.4)

## 2017-03-14 LAB — PHOSPHORUS: Phosphorus: 3.3 mg/dL (ref 2.5–4.6)

## 2017-03-14 MED ORDER — DEXTROSE-NACL 5-0.45 % IV SOLN: INTRAVENOUS | Status: DC

## 2017-03-14 MED ORDER — FREE WATER
100.0000 mL | Freq: Three times a day (TID) | Status: DC
  Administered 2017-03-14 – 2017-03-15 (×3): 100 mL

## 2017-03-14 NOTE — Progress Notes (Signed)
PT Cancellation Note  Patient Details Name: Kristine Boyer MRN: [id number] DOB: [birth year]   Cancelled Treatment:    Reason Eval/Treat Not Completed: Fatigue/lethargy limiting ability to participate   Duncan Dull 03/14/2017, 11:22 AM

## 2017-03-14 NOTE — Progress Notes (Signed)
[facility] PROGRESS NOTE  Kristine Boyer EHO:[id number] DOB: 1941/04/20 DOA: 03/11/2017 PCP: Nicolette Bang, DO  Interim summary and history of present illness 76 year old female with history of atrial fibrillation (previously on eliquis), hypertension, hypothyroidism, hyperlipidemia, diabetes and baseline dementia; who presented with a slower speech and right hemiparesis. 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 Patient hospital course complicated with aspiration pneumonia and acute respiratory failure that requires intubation. Patient intubated from 6/11>>6/16; continue to struggle mentation wise, and  had terminal extubation with transition of care from critical care to [facility] at this moment. Patient has completed antibiotic therapy for aspiration pneumonia will continue to be essentially unresponsive and unable to follow any commands.  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 -patient with history of underlying dementia  -Currently obtunded and unable to follow any commands. Has prior events of agitation and was started on Seroquel. -At this moment will stop seroquel -continue supportive care and follow response -Given overall poor prognosis will involve palliative care for clarification about advance directives. -patient is DNR/DNI  2-acute hypoxemic respiratory failure secondary to suspected aspiration  -Patient with a prior history of dysphasia due to bone spurs -Patient received antibiotic therapy with Unasyn for 5 days -Is afebrile, normal WBCs and a low protocol -No further antibiotics will be provided -Continue supportive care. -After discussing with PCCM and there is no plan for further reintubation   3-hypertension -Continue Norvasc, Avapro, Toprol -Blood pressure is stable  4-atrial fibrillation: Chronic -Rate controlled -CHADsVASC score  7 -off anticoagulation due to acute ICH  5-acute kidney injury -Resolved -Will follow renal function trend  6-dysphagia: Due to CVA and with hx of bones spurs -at baseline was on dys 1 -currently receiving TF's  7-diabetes mellitus: With vascular complication -Continue sliding scale insulin  8-hypothyroidism -will continue Synthroid  Code Status: DO NOT RESUSCITATE Family Communication: Son at bedside Disposition Plan: Patient remains in hospital, will continue supportive care and follow clinical response. At this moment palliative care service will be consulted for clarification of goals of care and advanced directives. Overall poor prognosis.  Consultants:  PCCM  Neurology   Procedures:  See below for x-ray reports  2-D echo: Ejection fraction 60-65%, moderate diastolic dysfunction, no wall motion abnormalities, no significant valvular disease.  Patient intubated 6/11>>until 6/16  Antibiotics:  Patient has completed 5 days of Unasyn  HPI/Subjective: Afebrile, patient unable to follow any commands, obtunded and non-verbal.  Objective: Vitals:   03/14/17 0448 03/14/17 1010  BP: (!) 154/81 (!) 158/97  Pulse: 83 84  Resp: 18 20  Temp: 98.5 F (36.9 C) 98.9 F (37.2 C)    Intake/Output Summary (Last 24 hours) at 03/14/17 1418 Last data filed at 03/14/17 0139  Gross per 24 hour  Intake                0 ml  Output              800 ml  Net             -800 ml   Filed Weights   03/11/17 0500 03/12/17 0325 03/13/17 0319  Weight: 80 kg (176 lb 5.9 oz) 78.9 kg (173 lb 15.1 oz) 77.8 kg (171 lb 8.3 oz)    Exam:   General:  Afebrile, obtunded and unable to follow any commands. Patient essentially moving her limbs away from painful stimuli but nonverbal. Medications are currently given through a  coratrak tube alone with some maintenance tube feeding nutrition.  Cardiovascular: Rate control, no gallops, positive systolic ejection murmur and no  rubs.  Respiratory: fair air movement, no wheezing, positive rhonchi.  Abdomen: Soft, nontender, positive bowel sounds  Musculoskeletal: No edema, no cyanosis, no clubbing  Data Reviewed: Basic Metabolic Panel:  Recent Labs Lab 03/09/17 0529 03/10/17 0335 03/11/17 0339 03/12/17 0409 03/14/17 0435  NA 141 143 143 144 146*  K 4.7 4.1 3.7 3.6 3.6  CL 106 109 104 105 107  CO2 27 26 29 29 31   GLUCOSE 128* 193* 193* 143* 148*  BUN 30* 32* 26* 27* 25*  CREATININE 1.08* 0.91 0.78 0.85 0.74  CALCIUM 9.1 8.5* 9.2 9.3 9.2  MG 2.2 2.4  --  2.1 2.3  PHOS 4.2 4.3  --  3.9 3.3   CBC:  Recent Labs Lab 03/09/17 0529 03/10/17 0335 03/11/17 0339 03/12/17 0409 03/14/17 0435  WBC 13.3* 12.5* 12.6* 11.2* 9.0  HGB 10.7* 9.8* 10.6* 11.0* 10.6*  HCT 33.1* 30.1* 32.1* 34.5* 33.5*  MCV 81.5 81.6 82.5 82.7 82.9  PLT 172 166 167 190 217   BNP (last 3 results)  Recent Labs  12/02/16 1604  BNP 153.2*   CBG:  Recent Labs Lab 03/13/17 2041 03/14/17 0004 03/14/17 0447 03/14/17 0827 03/14/17 1254  GLUCAP 153* 235* 145* 216* 259*    Recent Results (from the past 240 hour(s))  MRSA PCR Screening     Status: Abnormal   Collection Time: 03/08/17  7:53 AM  Result Value Ref Range Status   MRSA by PCR POSITIVE (A) NEGATIVE Final    Comment:        The GeneXpert MRSA Assay (FDA approved for NASAL specimens only), is one component of a comprehensive MRSA colonization surveillance program. It is not intended to diagnose MRSA infection nor to guide or monitor treatment for MRSA infections. RESULT CALLED TO, READ BACK BY AND VERIFIED WITH: D. Hovander RN 10:15 03/08/17 ([email or web address])   Culture, respiratory (NON-Expectorated)     Status: None   Collection Time: 03/08/17  8:04 AM  Result Value Ref Range Status   Specimen Description TRACHEAL ASPIRATE  Final   Special Requests NONE  Final   Gram Stain   Final    MODERATE WBC PRESENT,BOTH PMN AND MONONUCLEAR RARE SQUAMOUS EPITHELIAL  CELLS PRESENT RARE GRAM POSITIVE COCCI IN PAIRS    Culture Consistent with normal respiratory flora.  Final   Report Status 03/10/2017 FINAL  Final  Culture, blood (Routine X 2) w Reflex to ID Panel     Status: None   Collection Time: 03/08/17  8:30 AM  Result Value Ref Range Status   Specimen Description BLOOD RIGHT HAND  Final   Special Requests IN PEDIATRIC BOTTLE Blood Culture adequate volume  Final   Culture NO GROWTH 5 DAYS  Final   Report Status 03/13/2017 FINAL  Final  Culture, blood (Routine X 2) w Reflex to ID Panel     Status: None   Collection Time: 03/08/17  8:30 AM  Result Value Ref Range Status   Specimen Description BLOOD RIGHT HAND  Final   Special Requests IN PEDIATRIC BOTTLE Blood Culture adequate volume  Final   Culture NO GROWTH 5 DAYS  Final   Report Status 03/13/2017 FINAL  Final     Studies: No results found.  Scheduled Meds: . amLODipine  10 mg Per Tube Daily  . chlorhexidine  15 mL Mouth Rinse BID  . docusate  100 mg Oral  BID  . feeding supplement (PRO-STAT SUGAR FREE 64)  30 mL Per Tube BID  . guaiFENesin  10 mL Per Tube Q6H  . heparin subcutaneous  5,000 Units Subcutaneous Q8H  . insulin aspart  0-15 Units Subcutaneous Q4H  . ipratropium-albuterol  3 mL Nebulization TID  . irbesartan  150 mg Per Tube BID  . levothyroxine  50 mcg Per Tube QAC breakfast  . mouth rinse  15 mL Mouth Rinse BID  . metoprolol tartrate  100 mg Per Tube BID  . pantoprazole sodium  40 mg Per Tube Daily  . Racepinephrine HCl  0.5 mL Nebulization Once  . sennosides  5 mL Per Tube BID   Continuous Infusions: . sodium chloride 20 mL/hr at 03/13/17 1300  . feeding supplement (VITAL HIGH PROTEIN) 1,000 mL (03/14/17 0514)    Active Problems:   ICH (intracerebral hemorrhage) (HCC)   Dementia without behavioral disturbance   Diabetes mellitus type 2 in nonobese (HCC)   PAF (paroxysmal atrial fibrillation) (HCC)   Benign essential HTN   Acute on chronic diastolic heart  failure (HCC)   Tachypnea   Hypokalemia   Acute blood loss anemia   Respiratory failure ([place])   Aspiration into airway    Time spent: 35 minutes (>50% of the time dedicated to face to face examination, discussion with family about plan of care and discussing with specialist, Dr. Elsworth Soho about case and needs)    Barton Dubois  [facility] Pager [phone]. If 7PM-7AM, please contact night-coverage at [email or web address], password [facility] 03/14/2017, 2:18 PM  LOS: 13 days

## 2017-03-14 NOTE — Progress Notes (Signed)
Patient BP exceeded 697 systolic, CCMD reported HR sustaining over 120. Gave 10 mg labetalol, SBP now under 150 and HR below 100. Will continue to monitor.

## 2017-03-14 NOTE — Progress Notes (Signed)
STROKE TEAM PROGRESS NOTE   SUBJECTIVE (INTERVAL HISTORY) She appears to be less responsive today although there appears to be some fluctuation per the nurse and family. Son is at the bedside. He tells me that she has had a significant deterioration in cognitive function over the last year most due to dementia.   OBJECTIVE Temp:  [98.5 F (36.9 C)-98.9 F (37.2 C)] 98.9 F (37.2 C) (06/17 1010) Pulse Rate:  [82-84] 84 (06/17 1010) Cardiac Rhythm: Heart block (06/17 0700) Resp:  [18-20] 20 (06/17 1010) BP: (126-158)/(78-97) 158/97 (06/17 1010) SpO2:  [96 %-100 %] 100 % (06/17 1449) FiO2 (%):  [28 %] 28 % (06/17 1449)  CBC:   Recent Labs Lab 03/12/17 0409 03/14/17 0435  WBC 11.2* 9.0  HGB 11.0* 10.6*  HCT 34.5* 33.5*  MCV 82.7 82.9  PLT 190 109    Basic Metabolic Panel:   Recent Labs Lab 03/12/17 0409 03/14/17 0435  NA 144 146*  K 3.6 3.6  CL 105 107  CO2 29 31  GLUCOSE 143* 148*  BUN 27* 25*  CREATININE 0.85 0.74  CALCIUM 9.3 9.2  MG 2.1 2.3  PHOS 3.9 3.3    Lipid Panel:     Component Value Date/Time   CHOL 140 12/03/2016 0409   TRIG 46 03/02/2017 1010   HDL 51 12/03/2016 0409   CHOLHDL 2.7 12/03/2016 0409   VLDL 18 12/03/2016 0409   LDLCALC 71 12/03/2016 0409   HgbA1c:  Lab Results  Component Value Date   HGBA1C 11.9 (H) 02/08/2017   Urine Drug Screen:     Component Value Date/Time   LABOPIA NONE DETECTED 03/07/2017 0940   COCAINSCRNUR NONE DETECTED 03/24/2017 0940   LABBENZ NONE DETECTED 03/07/2017 0940   AMPHETMU NONE DETECTED 03/25/2017 0940   THCU NONE DETECTED 03/09/2017 0940   LABBARB NONE DETECTED 03/26/2017 0940    Alcohol Level     Component Value Date/Time   ETH <5 03/10/2017 0954    IMAGING  Ct Head Wo Contrast 03/08/2017 No unexpected finding. No enlargement of the left thalamic intraparenchymal hematoma, which is becoming less dense in normal evolutionary fashion.   Ct Head Wo Contrast 03/02/2017 0706 Overall  relatively similar exam to yesterday. Left thalamic 2.2 x 2 x 1.6 cm hematoma with surrounding vasogenic edema minimally changed from the prior examination when this measured 2.1 x 2 x 1.5 cm. Mass-effect upon the left lateral ventricle and third ventricle with 3 mm midline shift to the right. No development of hydrocephalus. 8 mm rounded hyperdensity right occipital lobe (series 5, image 54) unchanged to from 02/08/2017 and possibly related to remote hemorrhage. Chronic microvascular changes. Global atrophy.    Mr Brain Wo Contrast 03/12/2017 2221 1. 2.0 x 1.7 x 1.9 cm acute left thalamic hemorrhage. Associated vasogenic edema with localized 3 mm of left-to-right midline shift. No hydrocephalus or evidence for ventricular trapping. No intraventricular extension. Hypertensive etiology is suspected. 2. Innumerable scattered foci of chronic hemorrhages involving the posterior bilateral cerebral hemispheres, deep gray nuclei, brainstem, and cerebellum. Distribution favors chronic underlying hypertension. 3. Small remote right thalamic lacunar infarcts with additional small remote left cerebellar infarct. 4. Generalized age-related cerebral atrophy with moderate chronic small vessel ischemic disease.    Ct Head Wo Contrast 03/12/2017 1049 2 cm left thalamic hematoma (~3 cc). Associated edema and local mild mass-effect.    CUS - 02/09/17 - Bilateral: Negative bilateral carotid duplex ultrasound.[name]   TTE - Normal LV size with mild LV hypertrophy. EF 60-65%. Moderate  diastolic dysfunction. Normal RV size and systolic function. No   significant valvular abnormalities. Left atrial enlargement.   Ct Head Wo Contrast 03/04/2017 1. Stable size of left thalamic hemorrhage, measuring 2.2 x 2.0 x 1.7 cm. Associated vasogenic edema is relatively similar with similar mass effect on the adjacent left lateral and third ventricles and stable 3 mm of left-to-right shift. No hydrocephalus or ventricular trapping.  2. No  other new acute intracranial process.    Ct Head Wo Contrast 03/05/2017 1. Continued stable hemorrhage centered at the left ventral thalamus with estimated blood volume of 5 mL. Surrounding edema and mild regional mass effect are stable. 2. No new acute intracranial abnormality.   DG Left Hand Complete 03/07/2017 No acute abnormality.   PHYSICAL EXAM  Temp:  [98.5 F (36.9 C)-98.9 F (37.2 C)] 98.9 F (37.2 C) (06/17 1010) Pulse Rate:  [82-84] 84 (06/17 1010) Resp:  [18-20] 20 (06/17 1010) BP: (126-158)/(78-97) 158/97 (06/17 1010) SpO2:  [96 %-100 %] 100 % (06/17 1449) FiO2 (%):  [28 %] 28 % (06/17 1449)  GENERAL: She is extubated. She is doing fair.  HEENT: Normal  ABDOMEN: soft  EXTREMITIES: No edema   BACK: Normal  SKIN: Normal by inspection.    MENTAL STATUS: She is sleeping but arousable with light sternal rub. She follows commands infrequently using in the left upper extremity. No verbal output.  CRANIAL NERVES: Pupils are equal, round and reactive to light and accomodation; extra ocular movements are full, there is no significant nystagmus; visual fields are full; upper and lower facial muscles are normal in strength and symmetric, there is no flattening of the nasolabial folds.  MOTOR: She moves to the left upper extremity 3/5. She moves the right upper extremity at the strength of 2/5. No movements in the legs.  COORDINATION: Left finger to nose is normal, right finger to nose is normal, No rest tremor; no intention tremor; no postural tremor; no bradykinesia.       ASSESSMENT/PLAN Kristine Boyer is a 76 y.o. female with history of AF on eliquis, baseline dementia, HTN, HLD, DM, BPPV, bipolar presenting with slurred speech and R hemiparesis. CT showed a L thalamic hemorrhage.  The patient is not a candidate for long-term anticoagulation but can restart antiplatelet agent one month from now. Blood pressure control long-term is important. Goal is  recommended at 135/85. Did have a discussion with the son regarding long-term care. Patient will likely need nursing home placement. Also discussed the possibility of requiring PEG. They are to consider this one where the other. Follow-up is recommended in the stroke clinic in about 1-2 months. We'll sign off reconsult as needed.  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   CT head L thalamic hemorrhage with mild edema and mild mass effect  MRI head L thalamic hemorrhage w/ 98mm left to right shift. Scattered microhemorrhages. 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 Small vessel disease. Atrophy.   Repeat CT x 2 no significant change, no increase in hmg or hydrocephalus  Carotid Doppler  01/2017 Unremarkable   2D Echo  EF 60-65%  LDL 71 in March  HgbA1c 11.9 in May  SCDs for VTE prophylaxis    Eliquis (apixaban) daily prior to admission, now on No antithrombotic given hemorrhage. The patient is not a candidate for anticoagulation given the intracranial hemorrhage on this admission and  imaging showing previous hemorrhage. The patient can be restarted on antiplatelet agents in about one month from now.  Ongoing aggressive stroke risk factor management  Therapy recommendations:  Pending - CIR following at a distance. Therapies currently on hold.  Disposition:  pending   Paroxysmal Atrial Fibrillation  Home anticoagulation:  Eliquis (apixaban) daily - last dose 5 days prior to admission  No an AC candidate now d/t hemorrhage  Home metoprolol 200 q 24h  CHA2DS2-VASc Score = at least 7  Age in Years:  ?28   +2    Sex:  Female   Female   +1    Hypertension History:  yes   +1     Diabetes Mellitus:  yes   +1   Congestive Heart Failure History:  0  Vascular Disease History:  0  Stroke/TIA/Thromboembolism History:  yes   +2  Will consider  anticoagulation options once blood absorbed.   Hypertension  Home med: diovan and metoprolol  BP stable   SBP goal < 160  Off Cleviprex  Resumed irbesartan and metoprolol  Add amlodipine for better BP control  Continue to monitor - still mildly high at times. Better on Sunday.  Hyperlipidemia  Home meds:  lipitor 40  LDL 71 in March, almost at goal < 70  Continue statin at discharge  Diabetes  Home med: glucotrol  HgbA1c 11.9 in May, goal < 7.0  Uncontrolled  SSI  Dysphagia   On dys 1 diet with thin liquid but now intubated  Baseline dysphagia due to unclear etiology  Speech to follow  Other Stroke Risk Factors  Advanced age  UDS / ETOH level negative   Obesity, Body mass index is 30.38 kg/m., recommend weight loss, diet and exercise as appropriate   Other Active Problems  Baseline dementia - seroquel, desyrel  Bipolar  Hypothyroid on synthroid  Constipation on miralax, senna   Left hand pain and swelling -> no abnormality on x-ray. Likely osteoarthritis  Hypokalemia - 3.3 - supplement - recheck Tuesday  Mild anemia - stable  Patient was to be extubated 03/11/2017 but the weaning was discontinued due to her poor mental status.  Discussed with daughter at length; -> when patient is extubated will not re-intubate. Pt would not want a trach or peg.    Hospital day # 13    To contact Stroke Continuity provider, please refer to [email or web address] After hours, contact General Neurology

## 2017-03-15 ENCOUNTER — Telehealth: Payer: Self-pay | Admitting: Internal Medicine

## 2017-03-15 ENCOUNTER — Ambulatory Visit: Payer: Medicare HMO | Admitting: Internal Medicine

## 2017-03-15 DIAGNOSIS — Z515 Encounter for palliative care: Secondary | ICD-10-CM

## 2017-03-15 DIAGNOSIS — Z7189 Other specified counseling: Secondary | ICD-10-CM

## 2017-03-15 LAB — GLUCOSE, CAPILLARY
GLUCOSE-CAPILLARY: 141 mg/dL — AB (ref 65–99)
Glucose-Capillary: 217 mg/dL — ABNORMAL HIGH (ref 65–99)
Glucose-Capillary: 321 mg/dL — ABNORMAL HIGH (ref 65–99)

## 2017-03-15 LAB — CBC
HEMATOCRIT: 39.4 % (ref 36.0–46.0)
HEMOGLOBIN: 12.5 g/dL (ref 12.0–15.0)
MCH: 26.2 pg (ref 26.0–34.0)
MCHC: 31.7 g/dL (ref 30.0–36.0)
MCV: 82.6 fL (ref 78.0–100.0)
Platelets: 214 10*3/uL (ref 150–400)
RBC: 4.77 MIL/uL (ref 3.87–5.11)
RDW: 13.4 % (ref 11.5–15.5)
WBC: 10.8 10*3/uL — AB (ref 4.0–10.5)

## 2017-03-15 LAB — BASIC METABOLIC PANEL
ANION GAP: 9 (ref 5–15)
BUN: 28 mg/dL — ABNORMAL HIGH (ref 6–20)
CHLORIDE: 109 mmol/L (ref 101–111)
CO2: 29 mmol/L (ref 22–32)
Calcium: 9.5 mg/dL (ref 8.9–10.3)
Creatinine, Ser: 0.79 mg/dL (ref 0.44–1.00)
GFR calc non Af Amer: >60 mL/min (ref >60)
GLUCOSE: 237 mg/dL — AB (ref 65–99)
POTASSIUM: 4.2 mmol/L (ref 3.5–5.1)
Sodium: 147 mmol/L — ABNORMAL HIGH (ref 135–145)

## 2017-03-15 MED ORDER — GLYCOPYRROLATE 0.2 MG/ML IJ SOLN: 0.2000 mg | INTRAMUSCULAR | Status: DC | PRN

## 2017-03-15 MED ORDER — ONDANSETRON HCL 4 MG/2ML IJ SOLN: 4.0000 mg | Freq: Four times a day (QID) | INTRAMUSCULAR | Status: DC | PRN

## 2017-03-15 MED ORDER — LORAZEPAM 1 MG PO TABS: 1.0000 mg | ORAL_TABLET | ORAL | Status: DC | PRN

## 2017-03-15 MED ORDER — ONDANSETRON 4 MG PO TBDP: 4.0000 mg | ORAL_TABLET | Freq: Four times a day (QID) | ORAL | Status: DC | PRN

## 2017-03-15 MED ORDER — ACETAMINOPHEN 650 MG RE SUPP: 650.0000 mg | Freq: Four times a day (QID) | RECTAL | Status: DC | PRN

## 2017-03-15 MED ORDER — MORPHINE SULFATE (PF) 2 MG/ML IV SOLN
2.0000 mg | INTRAVENOUS | Status: DC | PRN
  Administered 2017-03-15 – 2017-03-16 (×4): 2 mg via INTRAVENOUS
  Filled 2017-03-15 (×4): qty 1

## 2017-03-15 MED ORDER — LORAZEPAM 2 MG/ML IJ SOLN: 1.0000 mg | INTRAMUSCULAR | Status: DC | PRN

## 2017-03-15 MED ORDER — GLYCOPYRROLATE 1 MG PO TABS: 1.0000 mg | ORAL_TABLET | ORAL | Status: DC | PRN

## 2017-03-15 MED ORDER — HALOPERIDOL LACTATE 2 MG/ML PO CONC
0.5000 mg | ORAL | Status: DC | PRN
  Filled 2017-03-15: qty 0.3

## 2017-03-15 MED ORDER — ACETAMINOPHEN 325 MG PO TABS
650.0000 mg | ORAL_TABLET | Freq: Four times a day (QID) | ORAL | Status: DC | PRN
  Administered 2017-03-15: 650 mg via ORAL
  Filled 2017-03-15: qty 2

## 2017-03-15 MED ORDER — HALOPERIDOL LACTATE 5 MG/ML IJ SOLN: 0.5000 mg | INTRAMUSCULAR | Status: DC | PRN

## 2017-03-15 MED ORDER — BIOTENE DRY MOUTH MT LIQD
15.0000 mL | OROMUCOSAL | Status: DC | PRN
  Administered 2017-03-15: 15 mL via TOPICAL
  Filled 2017-03-15: qty 15

## 2017-03-15 MED ORDER — LORAZEPAM 2 MG/ML PO CONC: 1.0000 mg | ORAL | Status: DC | PRN

## 2017-03-15 MED ORDER — POLYVINYL ALCOHOL 1.4 % OP SOLN
1.0000 [drp] | Freq: Four times a day (QID) | OPHTHALMIC | Status: DC | PRN
  Filled 2017-03-15: qty 15

## 2017-03-15 MED ORDER — HALOPERIDOL 1 MG PO TABS: 0.5000 mg | ORAL_TABLET | ORAL | Status: DC | PRN

## 2017-03-15 MED ORDER — METOPROLOL TARTRATE 5 MG/5ML IV SOLN
5.0000 mg | Freq: Four times a day (QID) | INTRAVENOUS | Status: DC
  Administered 2017-03-15: 5 mg via INTRAVENOUS
  Filled 2017-03-15: qty 5

## 2017-03-15 NOTE — Progress Notes (Signed)
Palliative Medicine consult noted. Due to high referral volume, there may be a delay seeing this patient. Please call the Palliative Medicine Team office at [phone] if recommendations are needed in the interim.  Thank you for inviting [place] to see this patient.  Marjie Skiff Jodelle Fausto, RN, BSN, [facility] 03/15/2017 11:19 AM Cell [phone] 8:00-4:00 Monday-Friday Office [phone]

## 2017-03-15 NOTE — Consult Note (Signed)
Consultation Note Date: 03/15/2017   Patient Name: Kristine Boyer  DOB: [birth year]  MRN: [id number]  Age / Sex: 76 y.o., female  PCP: Nicolette Bang, DO Referring Physician: Barton Dubois, MD  Reason for Consultation: Establishing goals of care, Hospice Evaluation, Non pain symptom management, Pain control and Terminal Care  HPI/Patient Profile: 76 y.o. female  with past medical history of HTN, afib, HLD, DM, dementia admitted on 03/11/2017 with slurred speech and R hemiparesis. Workup revealed hemorrhagic stroke with midline shift. During hospitalization she developed aspiration pneumonia and ARF requiring intubation.  Extubated on 6/16. Patient has had transient levels of responsiveness since extubation. Not able to eat or drink.  Per discussion with primary team family has decided no PEG and to transition to full comfort care.  Clinical Assessment and Goals of Care: Met with patient's daughter and son and other extended family members at bedside. Patient was awake. Unable to follow commands or answer questions. She smiles at her family members and mouths words that are unintelligible but family understands she is expressing love. Family very at peace with decision to transition to comfort care. Celestine is concerned about her mom being in pain and not being able to express it. Will start scheduled morphine to ensure this is not the case. Discussed roles of other comfort medications including lorazepam and haldol. Answered their questions related to services available at [facility] (this is their residential hospice of choice as they have had a family member placed there before). They have spoken to someone at [facility] and were informed that patient has a bed there, but she will not be able to go there until tomorrow.   Primary Decision Maker NEXT OF KIN - daughter Kristine Boyer and son Kristine Boyer    SUMMARY OF  RECOMMENDATIONS -Transition to full comfort measures -Social work consult for residential hospice - 2.46m morphine intensol SL q4hr -2.582mmorphine intensol SL q1hr prn -Agree with other comfort medications as ordered by primary MD -PMT will continue to follow and assist with symptom management until DC    Code Status/Advance Care Planning:  DNR    Symptom Management:   Agree with haldol, lorazepam and robinul as ordered. Will add scheduled morphine.  Palliative Prophylaxis:   Delirium Protocol and Frequent Pain Assessment  Additional Recommendations (Limitations, Scope, Preferences):  Full Comfort Care  Psycho-social/Spiritual:   Desire for further Chaplaincy support:yes  Additional Recommendations: Grief/Bereavement Support  Prognosis:    < 2 weeks  Discharge Planning: Hospice facility  Primary Diagnoses: Present on Admission: . ICH (intracerebral hemorrhage) ([facility]  I have reviewed the medical record, interviewed the patient and family, and examined the patient. The following aspects are pertinent.  Past Medical History:  Diagnosis Date  . Abdominal pain 10/04/2013  . Adenomatous colon polyp   . Allergy   . Altered sensation, foot 04/26/2012  . Alzheimer disease   . Anemia   . Bipolar affective ([facility]  . Blood transfusion without reported diagnosis   . BPPV (benign paroxysmal positional vertigo) 05/18/2011  .  Cataract   . Chest pain 06/18/2016  . De Quervain's syndrome (tenosynovitis) 02/23/2011   Left wrist. Has worked w/ sports medicine and received steroid injection w/o relief.   . Diabetes mellitus   . Dysphagia 08/28/2013  . Dysuria 03/21/2014  . Flank pain 08/28/2013  . Foot pain, right 11/11/2010  . GERD (gastroesophageal reflux disease)   . Hyperlipidemia   . Hypertension   . Hypokalemia 03/18/2011   Takes potassium pills if develops muscle cramps. Eats lots of bananas. 11/2011   . Hypothyroidism   . Immune deficiency disorder ([place])   . Internal  hemorrhoids   . Leg swelling 02/14/2013  . Mass of oropharynx 05/15/2014  . Musculoskeletal pain 08/16/2012  . Scalp pain 01/16/2015  . Sciatica 10/08/2014  . Seizures ([place])   . Stiffness of neck 05/16/2014   Social History   Social History  . Marital status: Single    Spouse name: N/A  . Number of children: 3  . Years of education: 9/ GED   Occupational History  . retiredAdministrator, Civil Service    Social History Main Topics  . Smoking status: Former Smoker    Quit date: 10/13/2011  . Smokeless tobacco: Never Used  . Alcohol use No  . Drug use: No  . Sexual activity: Not Asked   Other Topics Concern  . None   Social History Narrative   Health Care POA:    Emergency Contact: Daughter, Kristine Boyer [phone]   End of Life Plan:    Who lives with you: Lives son.    Any pets: none   Diet: Patient currently does not follow a diabetic diet plan.  She reports eating sugary foods often.   Exercise: Patient does not have a current exercise plan.   Seatbelts: Patient reports wearing her seatbelt when she is in vehicle.   Hobbies: Dancing, watching tv, swimming, singing               Family History  Problem Relation Age of Onset  . Diabetes Mother   . CAD Mother   . Alcohol abuse Father   . Stomach cancer Sister   . Colon cancer Unknown        grandmother   Scheduled Meds: . chlorhexidine  15 mL Mouth Rinse BID  . docusate  100 mg Oral BID  . ipratropium-albuterol  3 mL Nebulization TID  . metoprolol tartrate  5 mg Intravenous Q6H  . sennosides  5 mL Per Tube BID   Continuous Infusions: PRN Meds:.acetaminophen **OR** acetaminophen, albuterol, antiseptic oral rinse, glycopyrrolate **OR** glycopyrrolate **OR** glycopyrrolate, haloperidol **OR** haloperidol **OR** haloperidol lactate, LORazepam **OR** LORazepam **OR** LORazepam, morphine injection, ondansetron **OR** ondansetron (ZOFRAN) IV, polyvinyl alcohol Medications Prior to Admission:  Prior to Admission medications     Medication Sig Start Date End Date Taking? Authorizing Provider  acetaminophen (TYLENOL) 325 MG tablet Take 650 mg by mouth every 6 (six) hours as needed for mild pain.   Yes [provider]  atorvastatin (LIPITOR) 40 MG tablet Take 1 tablet (40 mg total) by mouth daily. 06/22/16  Yes Nicolette Bang, DO  glipiZIDE (GLUCOTROL) 5 MG tablet Take 5 mg by mouth daily before breakfast.   Yes [provider]  guaiFENesin-dextromethorphan (ROBITUSSIN DM) 100-10 MG/5ML syrup Take 5 mLs by mouth every 4 (four) hours as needed for cough. 02/09/17  Yes Theodoro Grist, MD  magnesium oxide (MAG-OX) 400 (241.3 Mg) MG tablet Take 1 tablet (400 mg total) by mouth 2 (two) times daily. Patient taking  differently: Take 400 mg by mouth daily.  02/10/17  Yes Theodoro Grist, MD  Melatonin 10 MG TABS Take 10 mg by mouth at bedtime.   Yes [provider]  metoprolol (TOPROL-XL) 200 MG 24 hr tablet Take 1 tablet (200 mg total) by mouth daily. 02/17/17  Yes Nicolette Bang, DO  neomycin-bacitracin-polymyxin (NEOSPORIN) [id number] ointment Apply 1 application topically daily. Inner thigh   Yes [provider]  traZODone (DESYREL) 50 MG tablet Take 0.5-1 tablets (25-50 mg total) by mouth at bedtime as needed for sleep. Patient taking differently: Take 50 mg by mouth at bedtime.  12/16/16  Yes Nicolette Bang, DO  valsartan (DIOVAN) 320 MG tablet TAKE 1 TABLET (320 MG TOTAL) BY MOUTH DAILY. 11/10/16  Yes Nicolette Bang, DO  ELIQUIS 5 MG TABS tablet TAKE 1 TABLET (5 MG TOTAL) BY MOUTH 2 TIMES DAILY. 03/08/17   Nicolette Bang, DO  glipiZIDE (GLUCOTROL) 5 MG tablet TAKE 1 TABLET (5 MG TOTAL) BY MOUTH DAILY BEFORE BREAKFAST. 03/03/17   Mayo, Pete Pelt, MD  levothyroxine (SYNTHROID, LEVOTHROID) 50 MCG tablet TAKE 1 TABLET (50 MCG TOTAL) BY MOUTH DAILY. 03/15/17   Nicolette Bang, DO  metoprolol (TOPROL-XL) 200 MG 24 hr tablet TAKE 1 TABLET (200  MG TOTAL) BY MOUTH DAILY. Patient not taking: Reported on 03/24/2017 02/24/17   Nicolette Bang, DO  oxyCODONE (OXY IR/ROXICODONE) 5 MG immediate release tablet Take 0.5-1 tablets (2.5-5 mg total) by mouth every 6 (six) hours as needed for severe pain. Patient not taking: Reported on 03/26/2017 02/11/17   McDiarmid, Blane Ohara, MD  polyethylene glycol powder (GLYCOLAX/MIRALAX) powder Take 17 g by mouth daily. Patient taking differently: Take 17 g by mouth daily as needed for mild constipation.  12/17/14   Mariel Aloe, MD  QUEtiapine (SEROQUEL) 25 MG tablet TAKE 1 TABLET BY MOUTH AT BEDTIME 03/10/17   Nicolette Bang, DO  senna (SENOKOT) 8.6 MG tablet Take 2 tablets (17.2 mg total) by mouth 2 (two) times daily. Once achieve daily BM decrease to one tablet daily Patient taking differently: Take 2 tablets by mouth 2 (two) times daily as needed for constipation. Once achieve daily BM decrease to one tablet daily 10/24/13   Waldemar Dickens, MD   Allergies  Allergen Reactions  . Lactose Intolerance (Gi) Other (See Comments)    constipation  . Vicodin [Hydrocodone-Acetaminophen] Itching and Other (See Comments)    Dizzy and confused  . Tramadol Other (See Comments)    Distorted and confused  . Neurontin [Gabapentin] Other (See Comments)    Drowsy. Makes her feel "funny"   Review of Systems  Unable to perform ROS   Physical Exam  Constitutional:  cachetic  Neurological:  Awake, does not follow commands, tracks to voices, aphasic  Nursing note and vitals reviewed.   Vital Signs: BP (!) 166/88   Pulse 95   Temp 97.6 F (36.4 C) (Axillary)   Resp (!) 24   Ht _0  (1.6 m)   Wt 78 kg (171 lb 15.3 oz)   SpO2 98%   BMI 30.46 kg/m  Pain Assessment: No/denies pain   Pain Score: 0-No pain   SpO2: SpO2: 98 % O2 Device:SpO2: 98 % O2 Flow Rate: .O2 Flow Rate (L/min): 4 L/min  IO: Intake/output summary:  Intake/Output Summary (Last 24 hours) at 03/15/17 1605 Last data  filed at 03/15/17 1200  Gross per 24 hour  Intake  0 ml  Output              550 ml  Net             -550 ml    LBM: Last BM Date: 03/14/17 Baseline Weight: Weight: 80.8 kg (178 lb 2.1 oz) Most recent weight: Weight: 78 kg (171 lb 15.3 oz)     Palliative Assessment/Data: PPS: 10%     Thank you for this consult. Palliative medicine will continue to follow and assist as needed.   Time In: 1500 Time Out:1615  Time Total:75 minutes Greater than 50%  of this time was spent counseling and coordinating care related to the above assessment and plan.  Signed by: Mariana Kaufman, AGNP-C Palliative Medicine    Please contact Palliative Medicine Team phone at [phone] for questions and concerns.  For individual provider: See Shea Evans

## 2017-03-15 NOTE — Progress Notes (Signed)
[facility] PROGRESS NOTE  ALMETTA LIDDICOAT DGU:[id number] DOB: [birth year] DOA: 03/10/2017 PCP: Nicolette Bang, DO  Interim summary and history of present illness 76 year old female with history of atrial fibrillation (previously on eliquis), hypertension, hypothyroidism, hyperlipidemia, diabetes and baseline dementia; who presented with a slower speech and right hemiparesis. 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 Patient hospital course complicated with aspiration pneumonia and acute respiratory failure that requires intubation. Patient intubated from 6/11>>6/16; continue to struggle mentation wise, and  had terminal extubation with transition of care from critical care to [facility] at this moment. Patient has completed antibiotic therapy for aspiration pneumonia will continue to be essentially unresponsive and unable to follow any commands.  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 -patient with history of underlying dementia  -has remained obtunded, non-verbal, unable to follow any commands or perform any purposeful movement/activity. -after discussing with family and looking ahead for plan of care and advance directives will discontinue tube feedings and transition care to full comfort -patient struggling with secretions and with increase work of breathing -palliative care consulted for assistance with end of life care and symptomatic management -in my opinion strong candidate for residential hospice -patient is DNR/DNI -will start IV morphin, IV ativan as needed and use of robinul for secretions.  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 -will focus on comfort care only at this time  3-hypertension -since stopping tube feedings will stop current antihypertensive regimen  -will use IV metoprolol and if needed start clonidine patch  -main goal is comfort care  4-atrial fibrillation: Chronic -will continue IV metoprolol for rate control  -CHADsVASC score 7 -off anticoagulation due to acute ICH -now full comfort care   5-acute kidney injury -Resolved -no further blood draws will be pursuit   6-dysphagia: Due to CVA and with hx of bones spurs -at baseline was on dys 1 -will d/c tube feedings after long discussion with family -will focus on comfort care only   7-diabetes mellitus: With vascular complication -comfort care only now -will stop CBG's and insulin treatment.   8-hypothyroidism -will discontinue synthroid -focus on full comfort care  Code Status: DO NOT RESUSCITATE Family Communication: daughter at bedside  Disposition Plan: patient resp system compromised; long discussion with family who agrees on full comfort. Will d/c tube feedings, change medication and tailor treatment for comfort. Patient eligible in my opinion for residential hospice. Palliative care consulted.  Consultants:  PCCM  Neurology   Palliative Care  Procedures:  See below for x-ray reports  2-D echo: Ejection fraction 60-65%, moderate diastolic dysfunction, no wall motion abnormalities, no significant valvular disease.  Patient intubated 6/11>>until 6/16  Antibiotics:  Patient has completed 5 days of Unasyn while in the ICU  HPI/Subjective: Afebrile, non-verbal, unable to follow commands, with increase work of breathing and difficulty clearing secretions.   Objective: Vitals:   03/15/17 0603 03/15/17 0800  BP: (!) 165/81 (!) 154/83  Pulse: 91 86  Resp: (!) 24 (!) 24  Temp: 97.6 F  (36.4 C)    No intake or output data in the 24 hours ending 03/15/17  [place]   03/12/17 0325 03/13/17 0319 03/15/17 0500  Weight: 78.9 kg (173 lb 15.1 oz) 77.8 kg (171 lb 8.3 oz) 78 kg (171 lb 15.3 oz)    Exam:   General:  Patient is afebrile, but with increase work of breathing and with trouble celaring her own secretions. Unable to communicate, follow commands and not able to demonstrate purposeful activity. Patient move limbs with painful stimuli, but not able to keep them up against gravity.  Cardiovascular: tachycardic, no JVD, no rubs, no gallops, positive SEM.  Respiratory: fair air movement, diffuse rhonchi appreciated on exam, no wheezing.  Abdomen: soft, NT, ND, positive BS  Musculoskeletal: no edema, no cyanosis and no clubbing   Data Reviewed: Basic Metabolic Panel:  Recent Labs Lab 03/09/17 0529 03/10/17 0335 03/11/17 0339 03/12/17 0409 03/14/17 0435 03/15/17 0728  NA 141 143 143 144 146* 147*  K 4.7 4.1 3.7 3.6 3.6 4.2  CL 106 109 104 105 107 109  CO2 27 26 29 29 31 29   GLUCOSE 128* 193* 193* 143* 148* 237*  BUN 30* 32* 26* 27* 25* 28*  CREATININE 1.08* 0.91 0.78 0.85 0.74 0.79  CALCIUM 9.1 8.5* 9.2 9.3 9.2 9.5  MG 2.2 2.4  --  2.1 2.3  --   PHOS 4.2 4.3  --  3.9 3.3  --    CBC:  Recent Labs Lab 03/10/17 0335 03/11/17 0339 03/12/17 0409 03/14/17 0435 03/15/17 0728  WBC 12.5* 12.6* 11.2* 9.0 10.8*  HGB 9.8* 10.6* 11.0* 10.6* 12.5  HCT 30.1* 32.1* 34.5* 33.5* 39.4  MCV 81.6 82.5 82.7 82.9 82.6  PLT 166 167 190 217 214   BNP (last 3 results)  Recent Labs  12/02/16 1604  BNP 153.2*   CBG:  Recent Labs Lab 03/14/17 1254 03/14/17 1651 03/14/17 1955 03/15/17 0010 03/15/17 0424  GLUCAP 259* 199* 189* 321* 141*    Recent Results (from the past 240 hour(s))  MRSA PCR Screening     Status: Abnormal   Collection Time: 03/08/17  7:53 AM  Result Value Ref Range Status   MRSA by PCR POSITIVE (A) NEGATIVE Final    Comment:         The GeneXpert MRSA Assay (FDA approved for NASAL specimens only), is one component of a comprehensive MRSA colonization surveillance program. It is not intended to diagnose MRSA infection nor to guide or monitor treatment for MRSA infections. RESULT CALLED TO, READ BACK BY AND VERIFIED WITH: D. Hovander RN 10:15 03/08/17 ([email or web address])   Culture, respiratory (NON-Expectorated)     Status: None   Collection Time: 03/08/17  8:04 AM  Result Value Ref Range Status   Specimen Description TRACHEAL ASPIRATE  Final   Special Requests NONE  Final   Gram Stain   Final    MODERATE WBC PRESENT,BOTH PMN AND MONONUCLEAR RARE SQUAMOUS EPITHELIAL CELLS PRESENT RARE GRAM POSITIVE COCCI IN PAIRS    Culture Consistent with normal respiratory flora.  Final   Report Status 03/10/2017 FINAL  Final  Culture, blood (Routine X 2) w Reflex to ID Panel     Status: None   Collection Time: 03/08/17  8:30 AM  Result Value Ref Range Status   Specimen Description BLOOD RIGHT HAND  Final   Special Requests IN PEDIATRIC BOTTLE Blood Culture adequate volume  Final   Culture NO GROWTH 5 DAYS  Final   Report Status 03/13/2017 FINAL  Final  Culture, blood (Routine X 2) w Reflex to ID Panel  Status: None   Collection Time: 03/08/17  8:30 AM  Result Value Ref Range Status   Specimen Description BLOOD RIGHT HAND  Final   Special Requests IN PEDIATRIC BOTTLE Blood Culture adequate volume  Final   Culture NO GROWTH 5 DAYS  Final   Report Status 03/13/2017 FINAL  Final     Studies: No results found.  Scheduled Meds: . chlorhexidine  15 mL Mouth Rinse BID  . docusate  100 mg Oral BID  . guaiFENesin  10 mL Per Tube Q6H  . insulin aspart  0-15 Units Subcutaneous Q4H  . ipratropium-albuterol  3 mL Nebulization TID  . metoprolol tartrate  5 mg Intravenous Q6H  . sennosides  5 mL Per Tube BID   Continuous Infusions:   Active Problems:   ICH (intracerebral hemorrhage) (HCC)   Dementia without behavioral  disturbance   Diabetes mellitus type 2 in nonobese (HCC)   PAF (paroxysmal atrial fibrillation) (HCC)   Benign essential HTN   Acute on chronic diastolic heart failure (HCC)   Tachypnea   Hypokalemia   Acute blood loss anemia   Respiratory failure (HCC)   Aspiration into airway    Time spent: 35 minutes (>50% of the time dedicated to face to face examination and discussion of plan of care with daughter at bedside. Discussion about end of life and palliative care approach)    Barton Dubois  [facility] Pager [phone]. If 7PM-7AM, please contact night-coverage at [email or web address], password [facility] 03/15/2017, 8:50 AM  LOS: 14 days

## 2017-03-15 NOTE — Progress Notes (Signed)
Responded to consult for end of life. Several family members were in rm, intergenerational women of strong faith. Provided emotional/spiritual/grief support and prayer -- which they appreciated. Advised they may ask nurse to page for a chaplain at any time.   03/15/17 1400  Clinical Encounter Type  Visited With Patient and family together;Health care provider  Visit Type Initial;Psychological support;Spiritual support;Social support;Critical Care  Referral From Nurse  Spiritual Encounters  Spiritual Needs Prayer;Emotional;Grief support  Stress Factors  Patient Stress Factors Other (Comment) (end of life)  Family Stress Factors Family relationships;Loss   Gerrit Heck, Chaplain

## 2017-03-15 NOTE — Progress Notes (Addendum)
Nutrition Brief Note  Chart reviewed. Pt now transitioning to comfort care.   Cortrak tube & TF (Vital HP formula) discontinued. No further nutrition interventions warranted at this time.  Please re-consult as needed.   Arthur Holms, RD, LDN Pager #: [phone] After-Hours Pager #: [phone]

## 2017-03-15 NOTE — Telephone Encounter (Signed)
Had a wonderful, long conversation with patient's daughter today. Apologized that I had just become aware of the events of this hospitalization when Kristine Boyer was not at her scheduled [facility] appointment today. Kristine Boyer only had high praise for the care her mother had received from [place], neurology, palliative care, [facility] and the nursing staff at [facility]. As PCP, I am very appreciate of the excellent care provided by [facility] and palliative care as we move towards end of life care. Kristine Boyer feels that her mother is in very competent hands and that Kristine Boyer is not currently suffering.   We had a great conversation reminiscing about Kristine Boyer and her sparkling personality. The extended family has been able to visit with patient and a good amount of time has been spent in prayer, which [name] has taken comfort in. Daughter repeatedly talked about how she was happy that her mom would not have to suffer further and that although the ultimate, anticipated outcome of her mother passing away will be tough on her and the family, they are at peace with the decision of DNR and comfort care. I will continue to follow along socially. I have told daughter that she or other family members involved in care of Kristine Boyer may reach me at the clinic. [facility] clinic staff is welcome to page me as needed.   Phill Myron, D.O. 03/15/2017, 12:19 PM PGY-2, [place]

## 2017-03-15 NOTE — Progress Notes (Signed)
Patients BP is over 160 again will give 10 mg of labetalol and recheck BP shortly.

## 2017-03-28 NOTE — Progress Notes (Signed)
Pt DNR, Gwyndolyn Saxon, RN and [place], RN listened for 1 minute for breath sounds, no breath sounds heard, time of death 74, MD, chaplain and family at bedside.

## 2017-03-28 NOTE — Progress Notes (Signed)
Responded to notification by nurse when in progression that pt had passed. A daughter w/ whom had prayed yesterday when stopped by when several family members were in rm (who seemed, of many, to be most in charge) and a relative who'd just come for first time today to visit after feeling she should, were in rm and notified med staff that pt had passed. Provided emotional/spiritual/grief support and ministry of presence.   Daughter spoke of how her brother there after I'd visited yesterday was still denying their mother was that sick, and she didn't know if he'd even come in. She seemed to be in a good place of faith and resignation, and understood when med staff reiterated that pt would not have had much quality of life had she lived, and they were glad that pt hadn't died instead on transport to residential hospice when family were not with her.   Told family about pt placement process, and nurse will give card w/ phone number. Chaplain available for f/u.   04/05/17 0900  Clinical Encounter Type  Visited With Family;Health care provider  Visit Type Follow-up;Psychological support;Spiritual support;Social support;Death  Referral From Nurse  Spiritual Encounters  Spiritual Needs Prayer;Emotional;Grief support  Stress Factors  Family Stress Factors Loss   Gerrit Heck, Chaplain

## 2017-03-28 NOTE — Discharge Summary (Addendum)
Death Summary  Kristine Boyer GGY:[id number] DOB: 09/15/1941 DOA: 03-05-2017  PCP: Nicolette Bang, DO PCP/Office notified: notified through Electronic service.  Admit date: 03-05-2017 Date of Death: 03/20/2017  Final Diagnoses:  Active Problems:   ICH (intracerebral hemorrhage) ([place]); with surrounding vasogenic edema    Dementia without behavioral disturbance   Diabetes mellitus type 2 in nonobese (HCC)   PAF (paroxysmal atrial fibrillation) (HCC)   Benign essential HTN   Acute on chronic diastolic heart failure (HCC)   Tachypnea   Hypokalemia   Acute blood loss anemia   Respiratory failure (HCC)   Aspiration into airway   Palliative care by specialist   Advance care planning   Terminal care   Brief History of present illness:  76 year old female with history of atrial fibrillation (previously on eliquis), hypertension, hypothyroidism, hyperlipidemia, diabetes and baseline dementia; who presented with slurred  speech and right hemiparesis. 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 Patient hospital course complicated with aspiration pneumonia and acute respiratory failure that requires intubation. Patient intubated from 6/11>>6/16; continue to struggle mentation wise, and  had terminal extubation with transition of care from critical care to [facility] on 03/14/17.   Hospital Course:  1-left thalamic intracerebral hemorrhage with mild surrounding vasogenic edema and midline shift, right weakness and expressive aphasia as residual effects appreciated. -patient with underlying history of dementia  -patient remained obtunded, non-verbal, unable to follow any commands or perform any purposeful movement/activity since ICH event and extubation process. -after discussing with family and looking ahead for plan of care and advance directives NGT was discontinued and transitioned to full comfort care.  -patient found to be a residential hospice candidate;  but didn't make it out of the hospital.              -patient was made DNR/DNI and palliative care came on board to assist with symptomatic management and end of life care.  -patient was kept on comfort care with use of IV morphin, IV ativan as needed and use of robinul for secretions. -patient expired on 2017/03/20 at 9:45 am.  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 -patient treated with Unasyn for 5 days -she remained afebrile, with normal WBCs and a low procaltinonin after treatment. -After discussing with family and with concerns on lack of mental improvement for safe parameters extubation PCCM with family approval performed terminal extubation and made patient DNR/DNI. After 24 hours patient was transferred to hospitalist service and never was able to have purposeful interaction; which lead to lack of ability to protect her airways and eventually to resp arrest. -after discussing with family, full comfort care was decided and the patient expired on 20-Mar-2017 at 9:45 am.  3-hypertension -patient unable to take PO's -once NGT discontinued, medications were stopped/transitioned to IV -after discussing with patient's family decision was made for full comfort care and anticipated transition to residential hospice when bed available. -patient expired on 03-20-17 at 9:45 am  4-atrial fibrillation: Chronic -CHADsVASC score 7 -off anticoagulation due to acute ICH -patient transition to full comfort care  -was kept on IV metoprolol for rate and BP control until she expired.  5-acute kidney injury -Resolved prior to transfer from ICU. -no further blood draws were pursuit as patient transitioned to full comfort care.  6-Dysphagia: Due to CVA and with  hx of bones spurs -at baseline patient had hx  of dysphagia 1 -after long discussion with family and given poor prognosis and poor quality; family decided for full comfort care and tube feedings were discontinued. -dysphagia 1 comfort feeding permitted; patient expired on 2017-04-04 at 9:45  7-Diabetes mellitus: With vascular complication -after decision for full comfort decided; patient's insulin therapy and CBG's discontinued -patient was kept on full comfort care.  8-hypothyroidism -Synthroid was discontinued  -Full comfort care provided until patient expired.   Time: 25 minutes  Signed:  Barton Boyer  [facility] 04-04-17, 10:46 AM

## 2017-03-28 NOTE — Progress Notes (Signed)
CSW alerted that patient has passed. CSW no longer needed to facilitate discharge.  CSW signing off.  Laveda Abbe LCSW [phone]

## 2017-03-28 NOTE — Progress Notes (Signed)
CSW following to facilitate discharge planning. CSW alerted by MD earlier this morning that plan has changed to comfort care. CSW discussed change of plans with patient's family at bedside, including daughter and granddaughter. Patient's daughter acknowledged being at peace with the decision to switch to end of life care, and requested referral for [facility] to discuss residential hospice services. CSW contacted Harmon Pier with [facility] to discuss referral and to initiate discussion with patient's family.   Patient's daughter also requested information on who to contact to provide praise to hospital staff who have cared for the patient and provided support to the family. Patient's daughter discussed how wonderful the experience at the hospital has been throughout the entire stay, despite the stress of the patient's decline. CSW indicated she would follow up with the patient's daughter on who to contact.   CSW will continue to follow to facilitate discharge to [facility].  Laveda Abbe LCSW [phone]

## 2017-03-28 DEATH — deceased

## 2017-10-27 IMAGING — CT CT HEAD W/O CM
4 series · 15 of 47 positions shown, 17 images · non-contrast
Comparison: 03/04/2017 and earlier.

CLINICAL DATA: 75-year-old female with intracranial hemorrhage at
the left deep gray matter discovered on 03/01/2017 following
presentation of right side weakness.

EXAM:
CT HEAD WITHOUT CONTRAST
TECHNIQUE: Contiguous axial images were obtained from the base of the skull
through the vertex without intravenous contrast.

[Series 3: head without · axial · non-contrast · 0.44mm/px · z∈[-96,+24]mm · 7 of 33 slices shown, 9 images]
[im 5/33  brain]
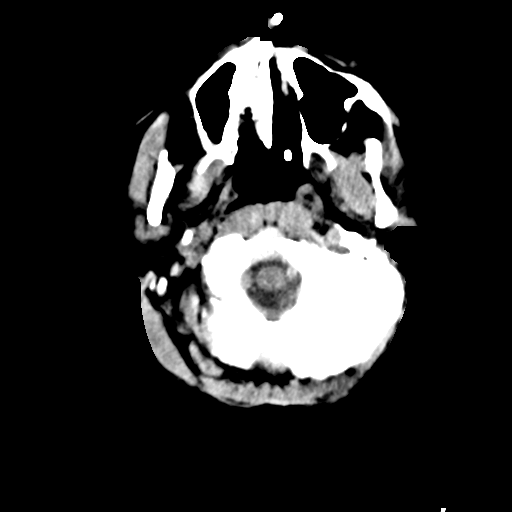
[im 5/33  bone]
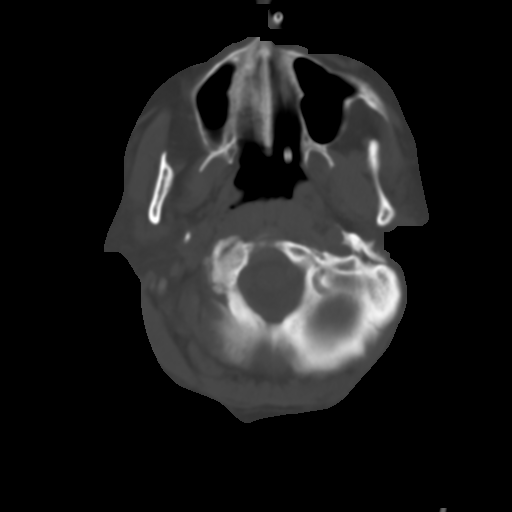
[im 9/33  brain]
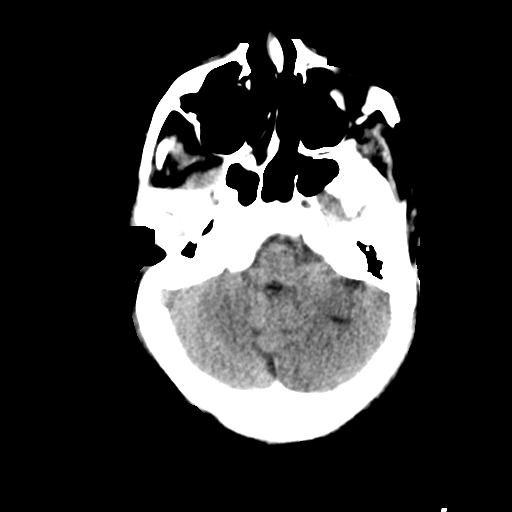
[im 13/33  brain]
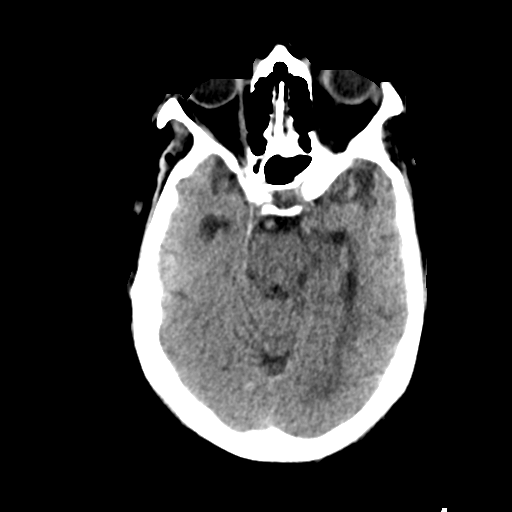
[im 17/33  brain]
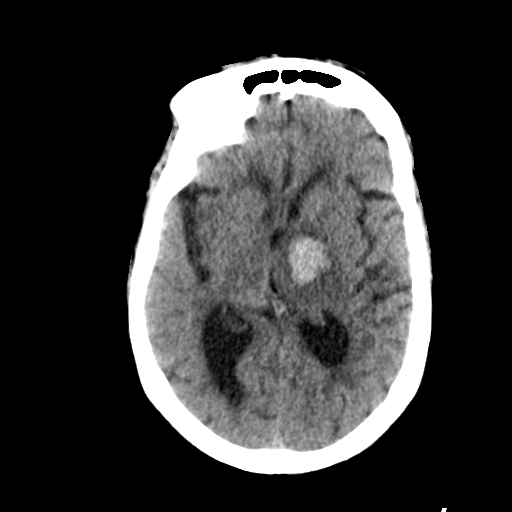
[im 21/33  brain]
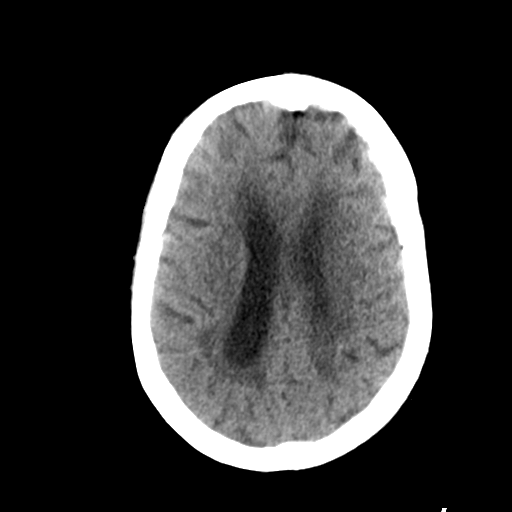
[im 21/33  bone]
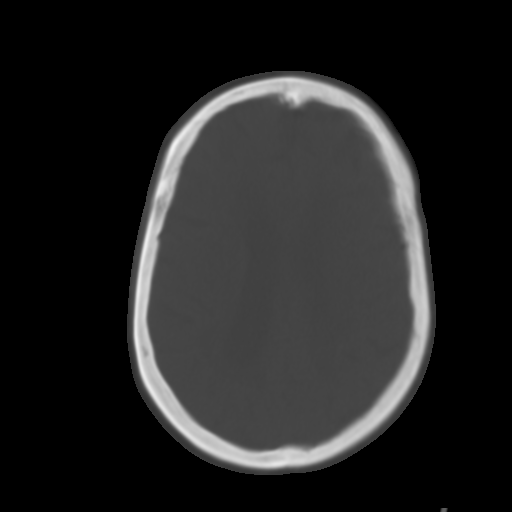
[im 25/33  brain]
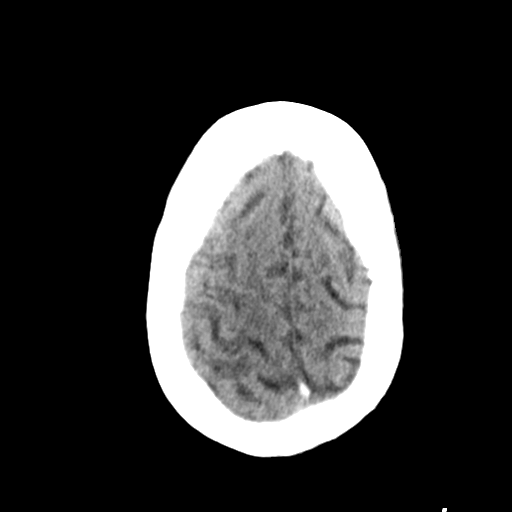
[im 29/33  brain]
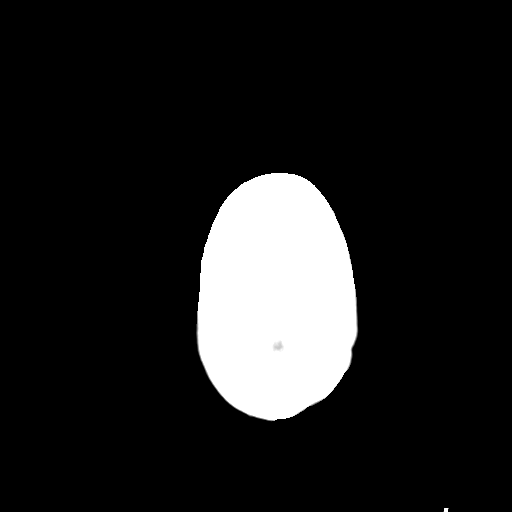

[Series 4: head bone · axial · 0.44mm/px · z∈[-100,-84]mm · 2 of 82 slices shown]
[im 9/82  bone]
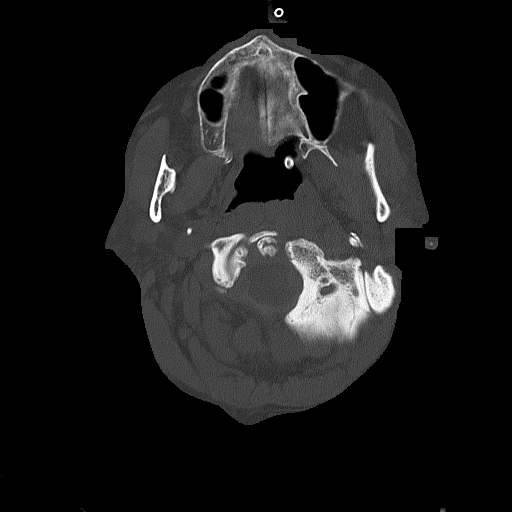
[im 17/82  bone]
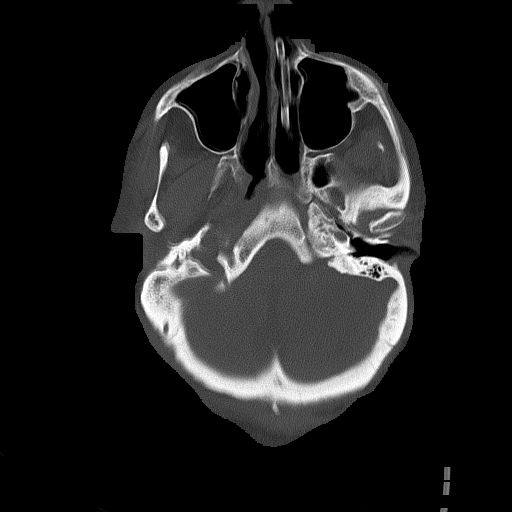

[Series 5: head without cor · coronal · non-contrast · 0.30mm/px · 3 of 69 slices shown]
[im 23/69  brain]
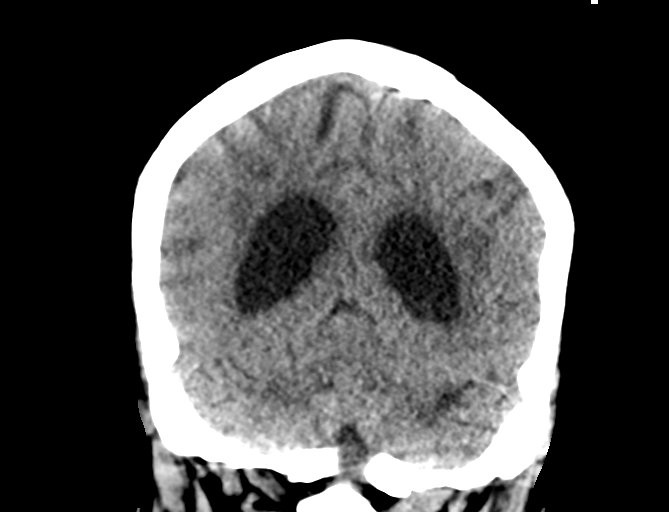
[im 31/69  brain]
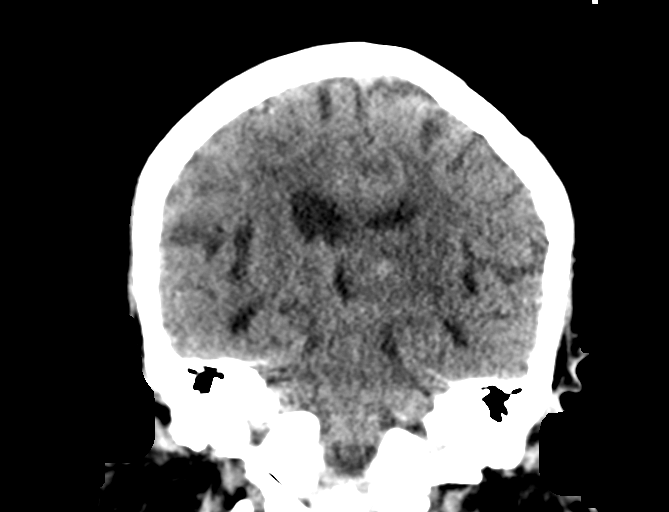
[im 38/69  brain]
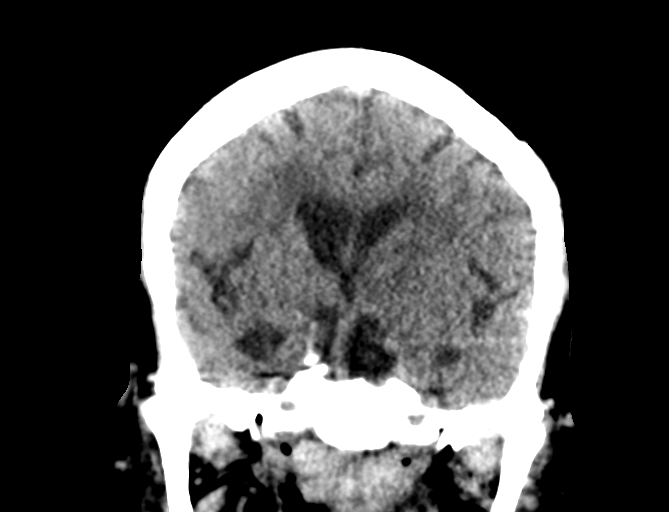

[Series 6: head without sag · sagittal · non-contrast · 0.28mm/px · 3 of 53 slices shown]
[im 18/53  brain]
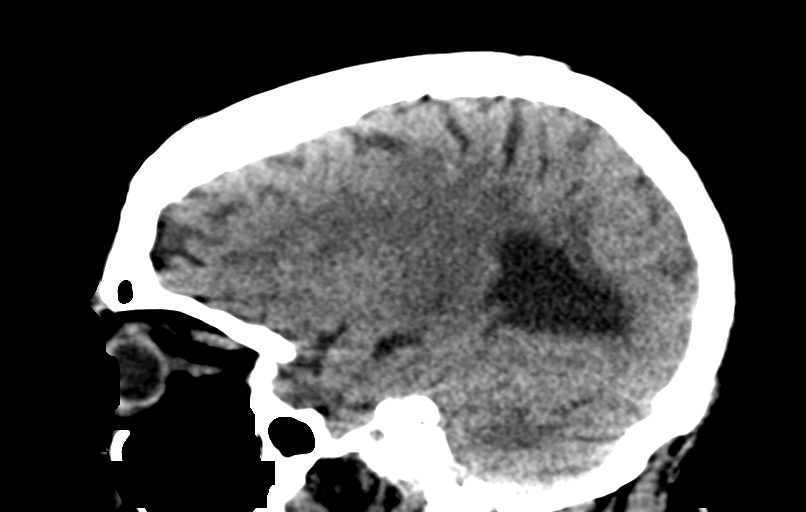
[im 27/53  brain]
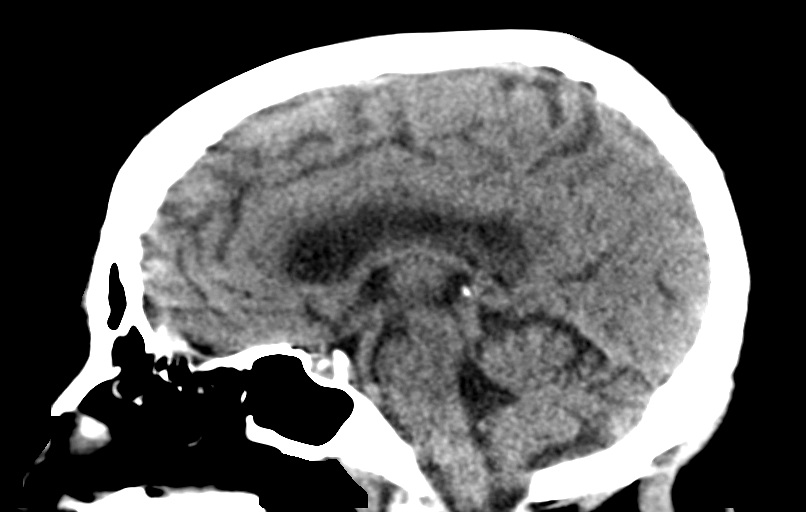
[im 35/53  brain]
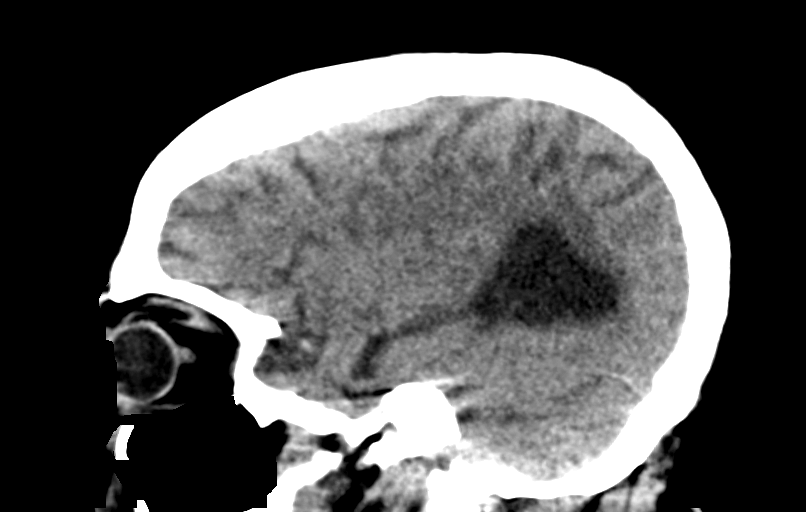

[15 of 47 positions shown; findings below may reference images not displayed]

FINDINGS: Brain: Rounded hyperdense blood products centered at the left
ventral thalamus encompass 23 x 20 x 21 mm (AP by transverse by CC)
versus 22 by 20 x 20 mm at a comparable level yesterday. Estimated
blood volume is 5 mL. Persistent surrounding edema. Stable mild
regional mass effect. No intraventricular or extra-axial extension.

No new intracranial hemorrhage. Stable gray-white matter
differentiation elsewhere with confluent bilateral white matter
hypodensity, and chronic lacunar infarct in the inferior left
cerebellum. No new cortically based infarct. No ventriculomegaly.

Vascular: Calcified atherosclerosis at the skull base.

Skull: Stable.  No acute osseous abnormality identified.

Sinuses/Orbits: Stable left side nasoenteric tube. Visualized
paranasal sinuses and mastoids are stable and well pneumatized.

Other: No acute orbit or scalp soft tissue finding.
IMPRESSION: 1. Continued stable hemorrhage centered at the left ventral thalamus
with estimated blood volume of 5 mL. Surrounding edema and mild
regional mass effect are stable.
2. No new No acute intracranial abnormality.

## 2017-10-30 IMAGING — DX DG CHEST 1V PORT
1 series · 1 of 1 positions shown · non-contrast
Comparison: PA and lateral chest x-ray February 08, 2017

CLINICAL DATA: Intracranial hemorrhage, respiratory failure, CHF,
intubated patient.

EXAM:
PORTABLE CHEST 1 VIEW

[chest ap]
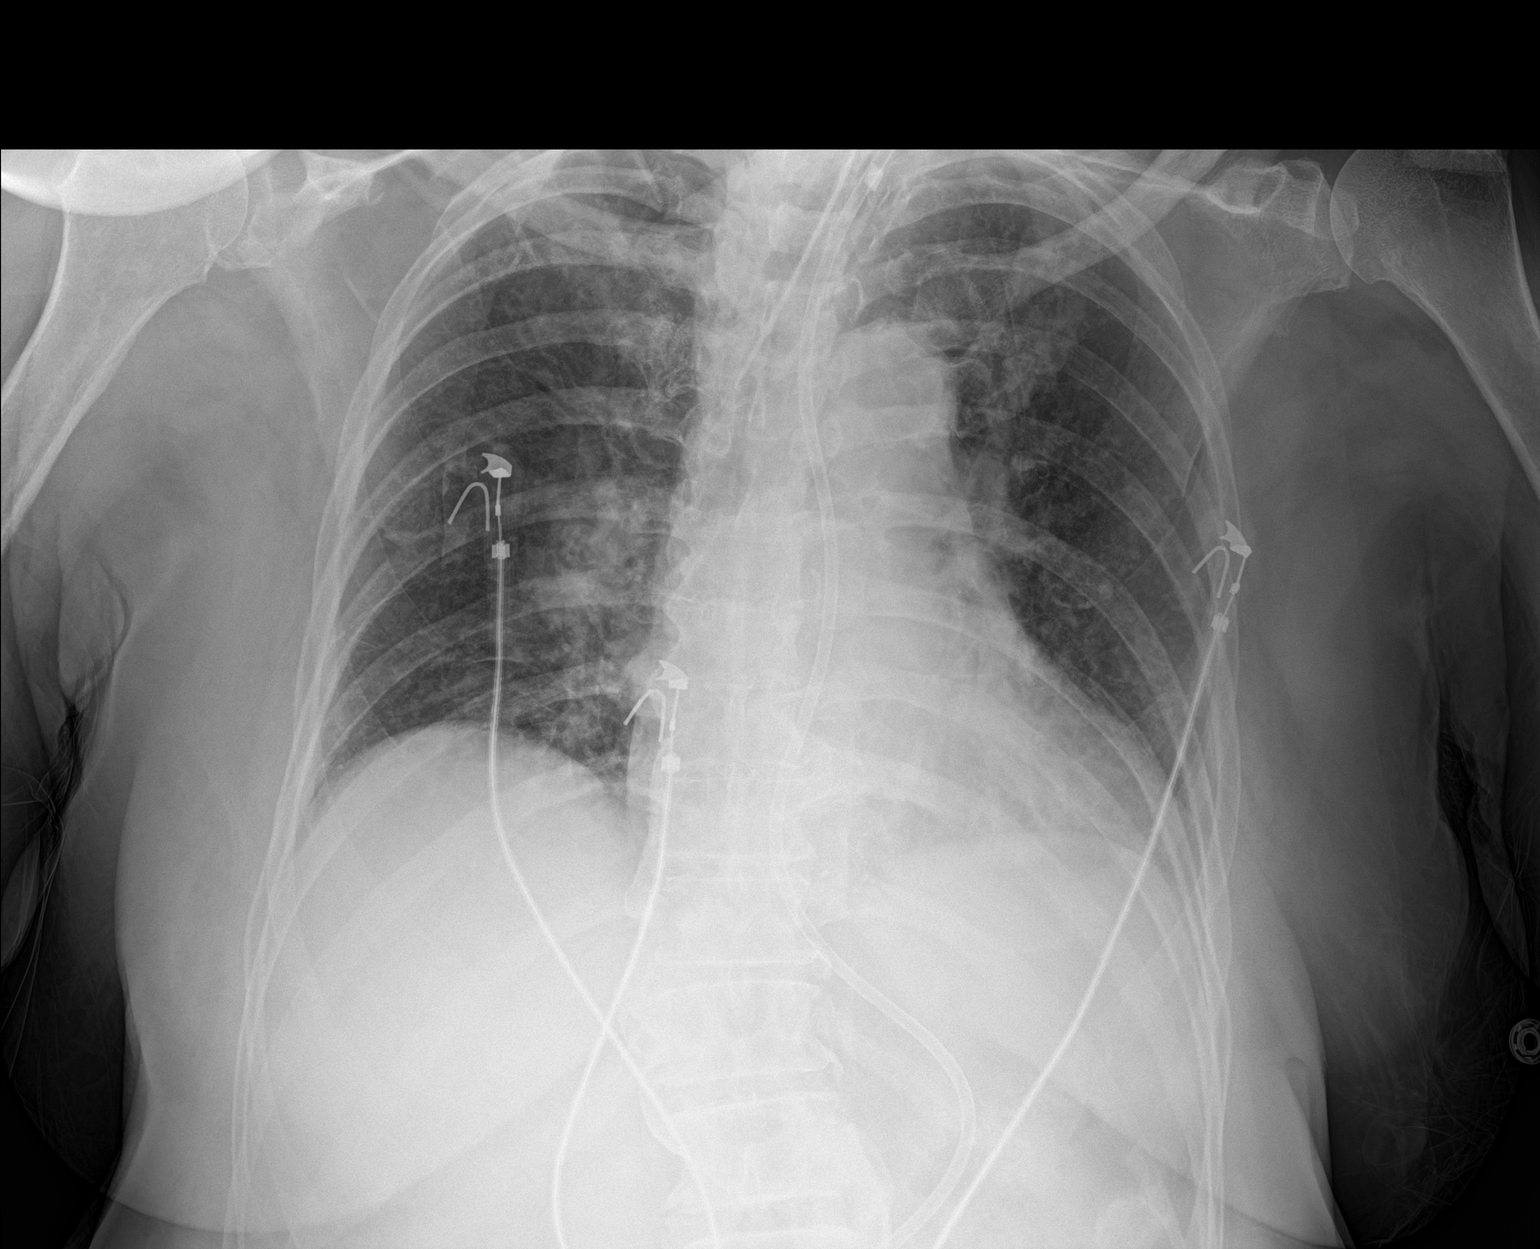

[1 of 1 positions shown; findings below may reference images not displayed]

FINDINGS: The lungs are adequately inflated. The interstitial markings are
coarse but have improved slightly since the previous study. The
endotracheal tube tip lies approximately 3 cm above the carina. The
feeding tube tip projects below the inferior margin of the image.
The heart is top-normal in size. The central pulmonary vascularity
is prominent. There is calcification in the wall of the aortic arch.
IMPRESSION: Mild central pulmonary vascular prominence without significant
peripheral pulmonary edema. No discrete pneumonia. The support tubes
are in reasonable position.

## 2017-10-31 IMAGING — DX DG CHEST 1V PORT
1 series · 1 of 1 positions shown · non-contrast
Comparison: Chest radiograph from one day prior.

CLINICAL DATA: Aspiration, intubated

EXAM:
PORTABLE CHEST 1 VIEW

[chest ap]
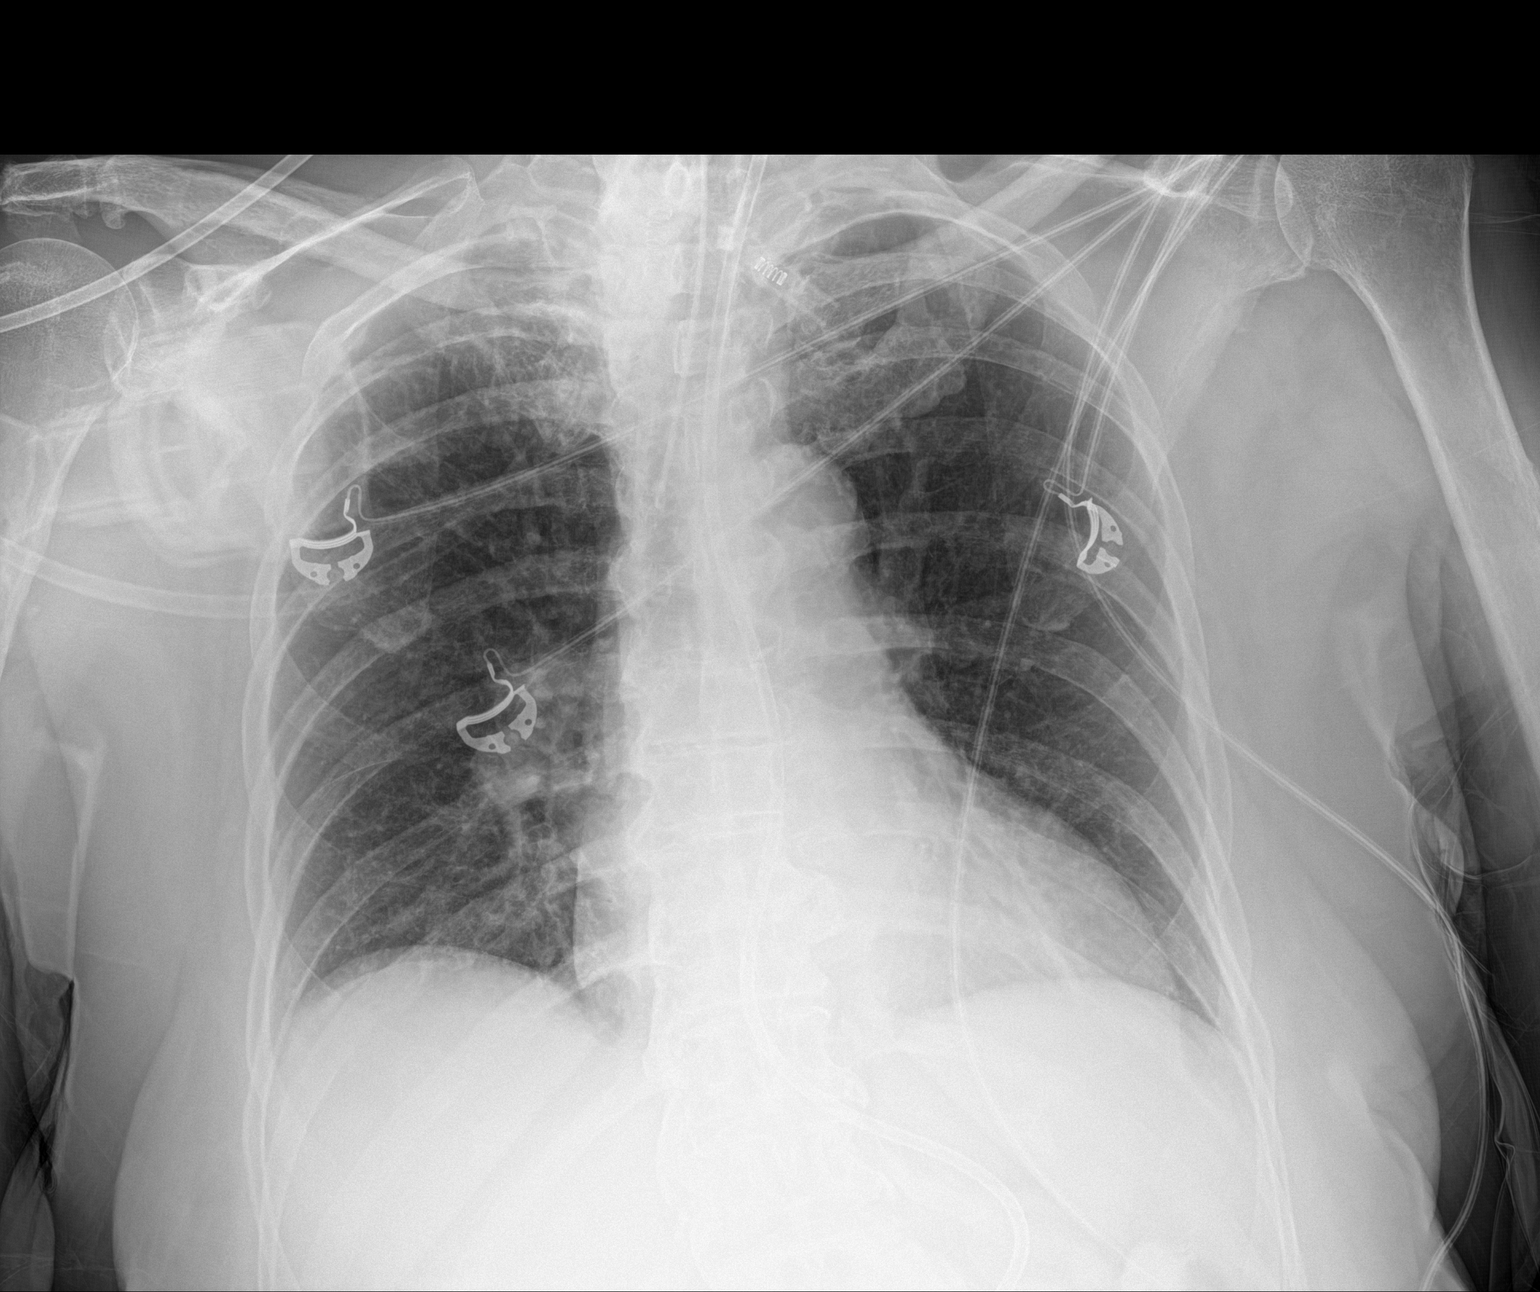

[1 of 1 positions shown; findings below may reference images not displayed]

FINDINGS: Endotracheal tube tip is 3.1 cm above the carina. Enteric tube
enters stomach with the tip not seen on this image. Stable
cardiomediastinal silhouette with top-normal heart size and aortic
atherosclerosis. No pneumothorax. No pleural effusion. No overt
pulmonary edema. Curvilinear opacity at the left lung base appears
stable, favor mild atelectasis. No acute consolidative airspace
disease.
IMPRESSION: 1. Well-positioned endotracheal and enteric tubes.
2. No pulmonary edema. Stable mild curvilinear left lung base
opacity, favor mild atelectasis.

## 2017-11-03 IMAGING — DX DG CHEST 1V PORT
1 series · 1 of 1 positions shown · non-contrast
Comparison: Portable chest x-ray March 10, 2017

CLINICAL DATA: Acute respiratory failure, acute intracranial
hemorrhage. Acute on chronic CHF.

EXAM:
PORTABLE CHEST 1 VIEW

[chest ap]
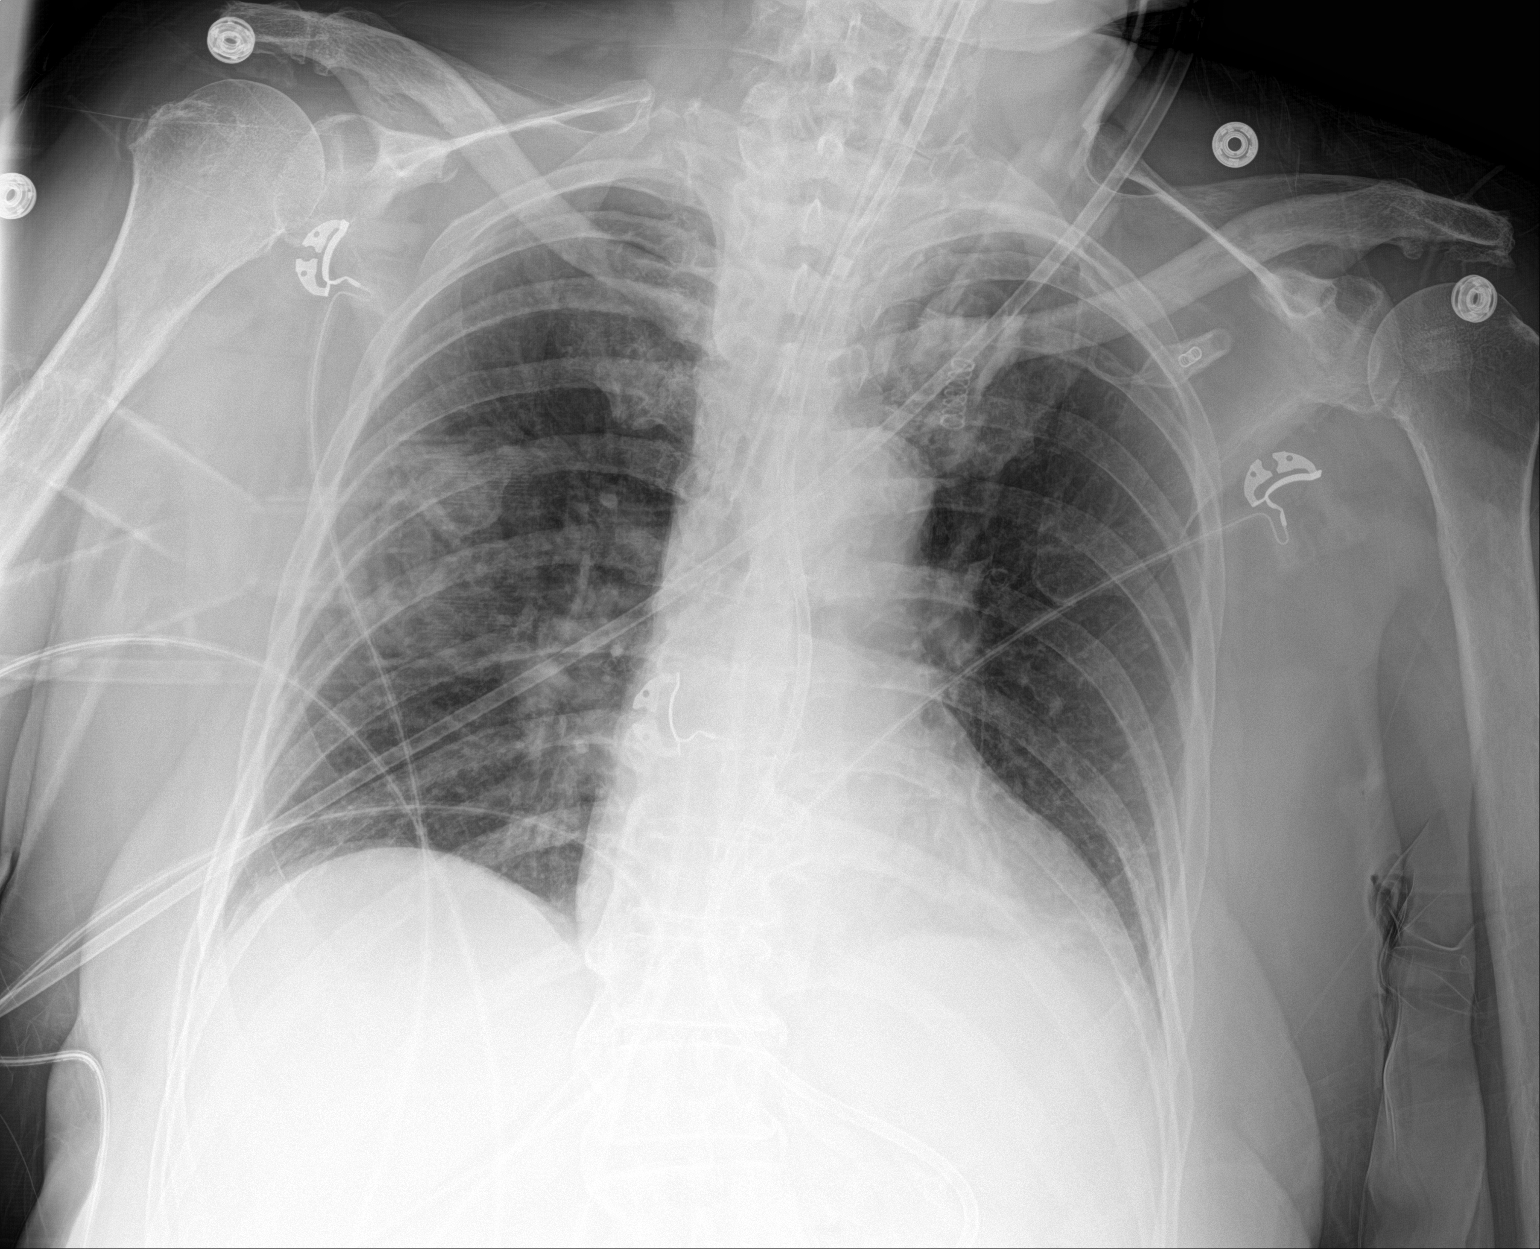

[1 of 1 positions shown; findings below may reference images not displayed]

FINDINGS: The lungs are well-expanded. There is no focal infiltrate. Density
in the right mid lung is felt be related to overlying support
appliances. No abnormal right apical density is observed today.
Stable mildly increased retrocardiac density on the left. The heart
and pulmonary vascularity are normal. The feeding tube tip projects
below the inferior margin of the image. The endotracheal tube tip
lies 3.6 cm above the carina.
IMPRESSION: Stable appearance of the chest since the study of 2 days ago.
Subsegmental atelectasis in the left lower lobe. No right apical
abnormality is observed today.
